# Patient Record
Sex: Female | Born: 1960 | Race: Black or African American | Hispanic: No | Smoking: Former smoker
Health system: Southern US, Community
[De-identification: ages and names within clinical notes are randomized; demographics above are authoritative.]

## PROBLEM LIST (undated history)

## (undated) DIAGNOSIS — E559 Vitamin D deficiency, unspecified: Secondary | ICD-10-CM

## (undated) DIAGNOSIS — E119 Type 2 diabetes mellitus without complications: Secondary | ICD-10-CM

## (undated) DIAGNOSIS — E079 Disorder of thyroid, unspecified: Secondary | ICD-10-CM

## (undated) DIAGNOSIS — N2881 Hypertrophy of kidney: Secondary | ICD-10-CM

## (undated) DIAGNOSIS — E039 Hypothyroidism, unspecified: Secondary | ICD-10-CM

## (undated) DIAGNOSIS — I10 Essential (primary) hypertension: Secondary | ICD-10-CM

## (undated) DIAGNOSIS — R160 Hepatomegaly, not elsewhere classified: Secondary | ICD-10-CM

## (undated) DIAGNOSIS — E785 Hyperlipidemia, unspecified: Secondary | ICD-10-CM

## (undated) DIAGNOSIS — R0683 Snoring: Secondary | ICD-10-CM

## (undated) DIAGNOSIS — M8580 Other specified disorders of bone density and structure, unspecified site: Secondary | ICD-10-CM

## (undated) DIAGNOSIS — G4730 Sleep apnea, unspecified: Secondary | ICD-10-CM

## (undated) DIAGNOSIS — L304 Erythema intertrigo: Secondary | ICD-10-CM

## (undated) DIAGNOSIS — Z5189 Encounter for other specified aftercare: Secondary | ICD-10-CM

## (undated) DIAGNOSIS — E669 Obesity, unspecified: Secondary | ICD-10-CM

## (undated) DIAGNOSIS — L83 Acanthosis nigricans: Secondary | ICD-10-CM

## (undated) DIAGNOSIS — L810 Postinflammatory hyperpigmentation: Secondary | ICD-10-CM

## (undated) HISTORY — DX: Other specified disorders of bone density and structure, unspecified site: M85.80

## (undated) HISTORY — DX: Hepatomegaly, not elsewhere classified: R16.0

## (undated) HISTORY — DX: Type 2 diabetes mellitus without complications: E11.9

## (undated) HISTORY — DX: Encounter for other specified aftercare: Z51.89

## (undated) HISTORY — PX: VESICOVAGINAL FISTULA CLOSURE W/ TAH: SUR271

## (undated) HISTORY — DX: Hyperlipidemia, unspecified: E78.5

## (undated) HISTORY — DX: Hypothyroidism, unspecified: E03.9

## (undated) HISTORY — DX: Vitamin D deficiency, unspecified: E55.9

## (undated) HISTORY — DX: Hypertrophy of kidney: N28.81

## (undated) HISTORY — DX: Erythema intertrigo: L30.4

## (undated) HISTORY — DX: Acanthosis nigricans: L83

## (undated) HISTORY — DX: Sleep apnea, unspecified: G47.30

## (undated) HISTORY — PX: COSMETIC SURGERY: SHX468

## (undated) HISTORY — DX: Snoring: R06.83

## (undated) HISTORY — DX: Postinflammatory hyperpigmentation: L81.0

## (undated) HISTORY — PX: OTHER SURGICAL HISTORY: SHX169

## (undated) HISTORY — DX: Disorder of thyroid, unspecified: E07.9

## (undated) HISTORY — DX: Hemochromatosis, unspecified: E83.119

## (undated) HISTORY — DX: Essential (primary) hypertension: I10

## (undated) HISTORY — DX: Obesity, unspecified: E66.9

## (undated) HISTORY — PX: BREAST REDUCTION SURGERY: SHX8

---

## 1983-01-20 HISTORY — PX: REDUCTION MAMMAPLASTY: SUR839

## 1998-01-19 HISTORY — PX: CHOLECYSTECTOMY: SHX55

## 2003-01-20 HISTORY — PX: ABDOMINAL HYSTERECTOMY: SHX81

## 2005-07-06 ENCOUNTER — Other Ambulatory Visit: Payer: Self-pay

## 2005-07-06 ENCOUNTER — Observation Stay: Payer: Self-pay | Admitting: Obstetrics and Gynecology

## 2005-07-06 IMAGING — CR DG CHEST 2V
1 series · 2 of 2 positions shown · non-contrast
Comparison: none

REASON FOR EXAM: NIDDM/HTN
COMMENTS:

[Series 1: view not recorded · 0.17mm/px · 2 of 2 slices shown]
[im 1/2]
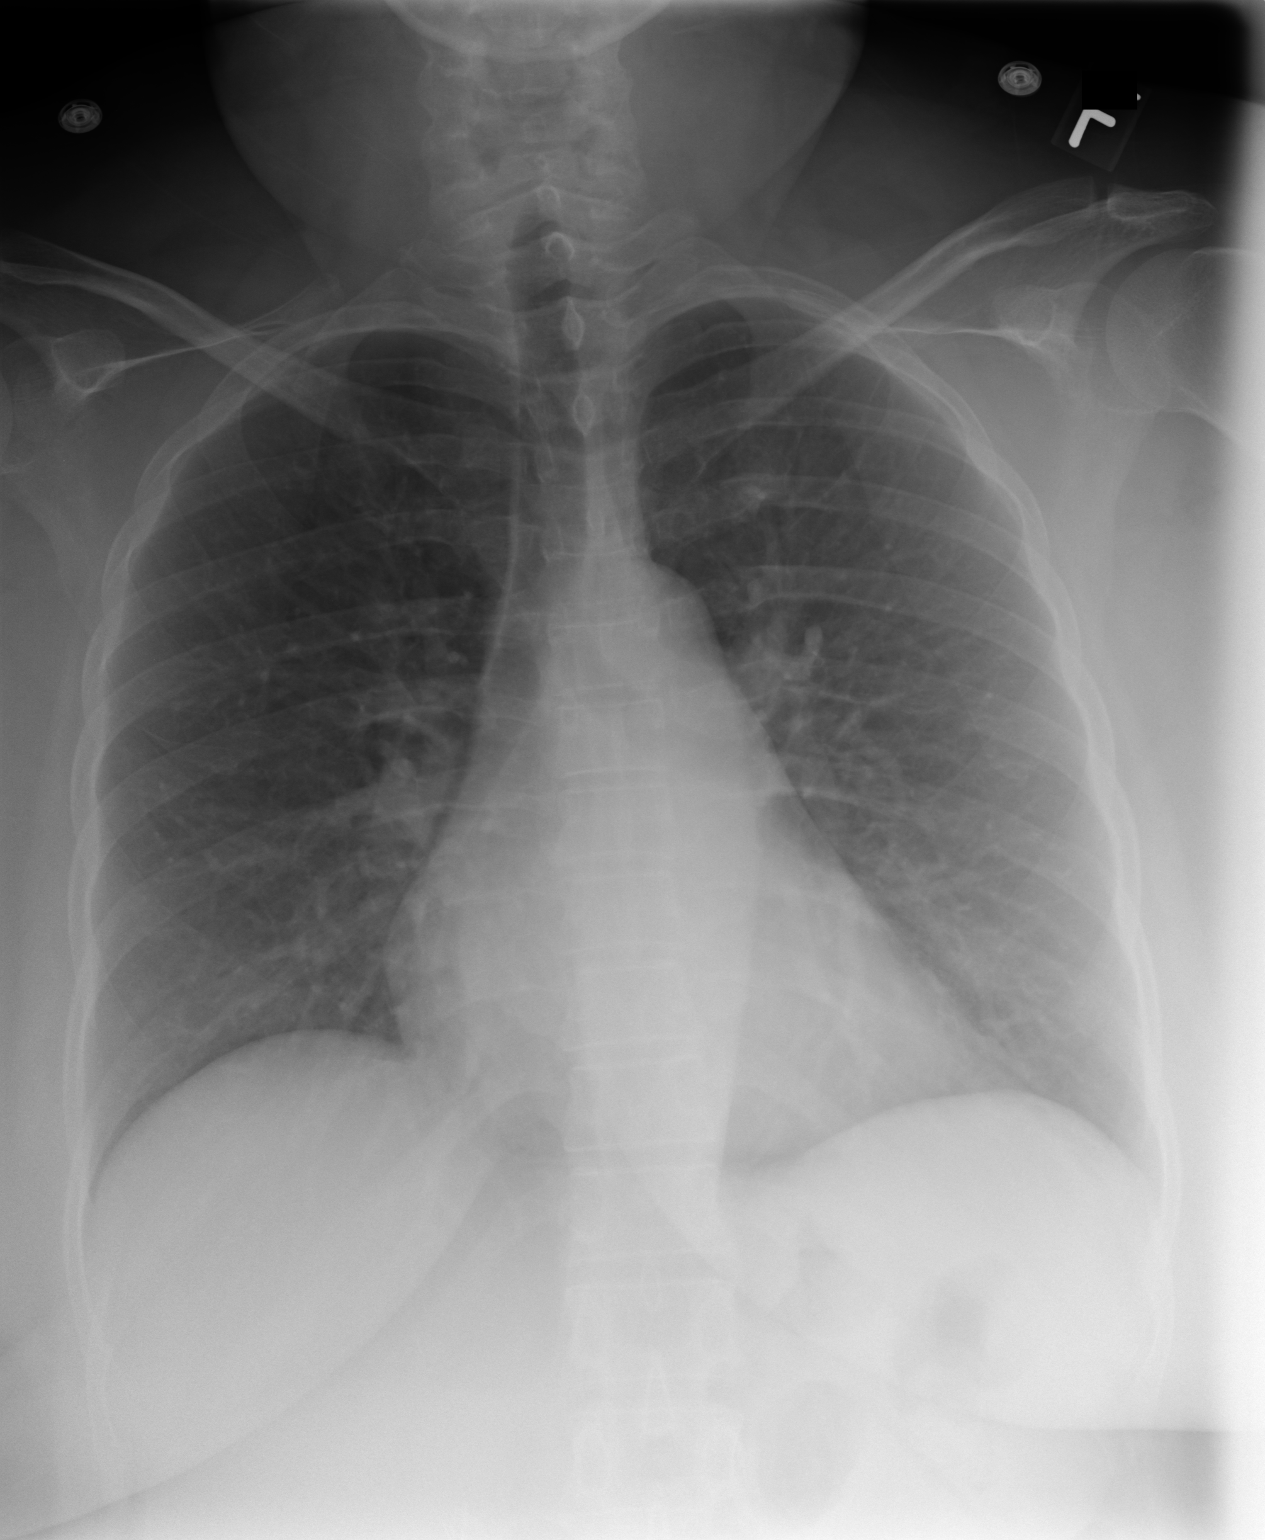
[im 2/2]
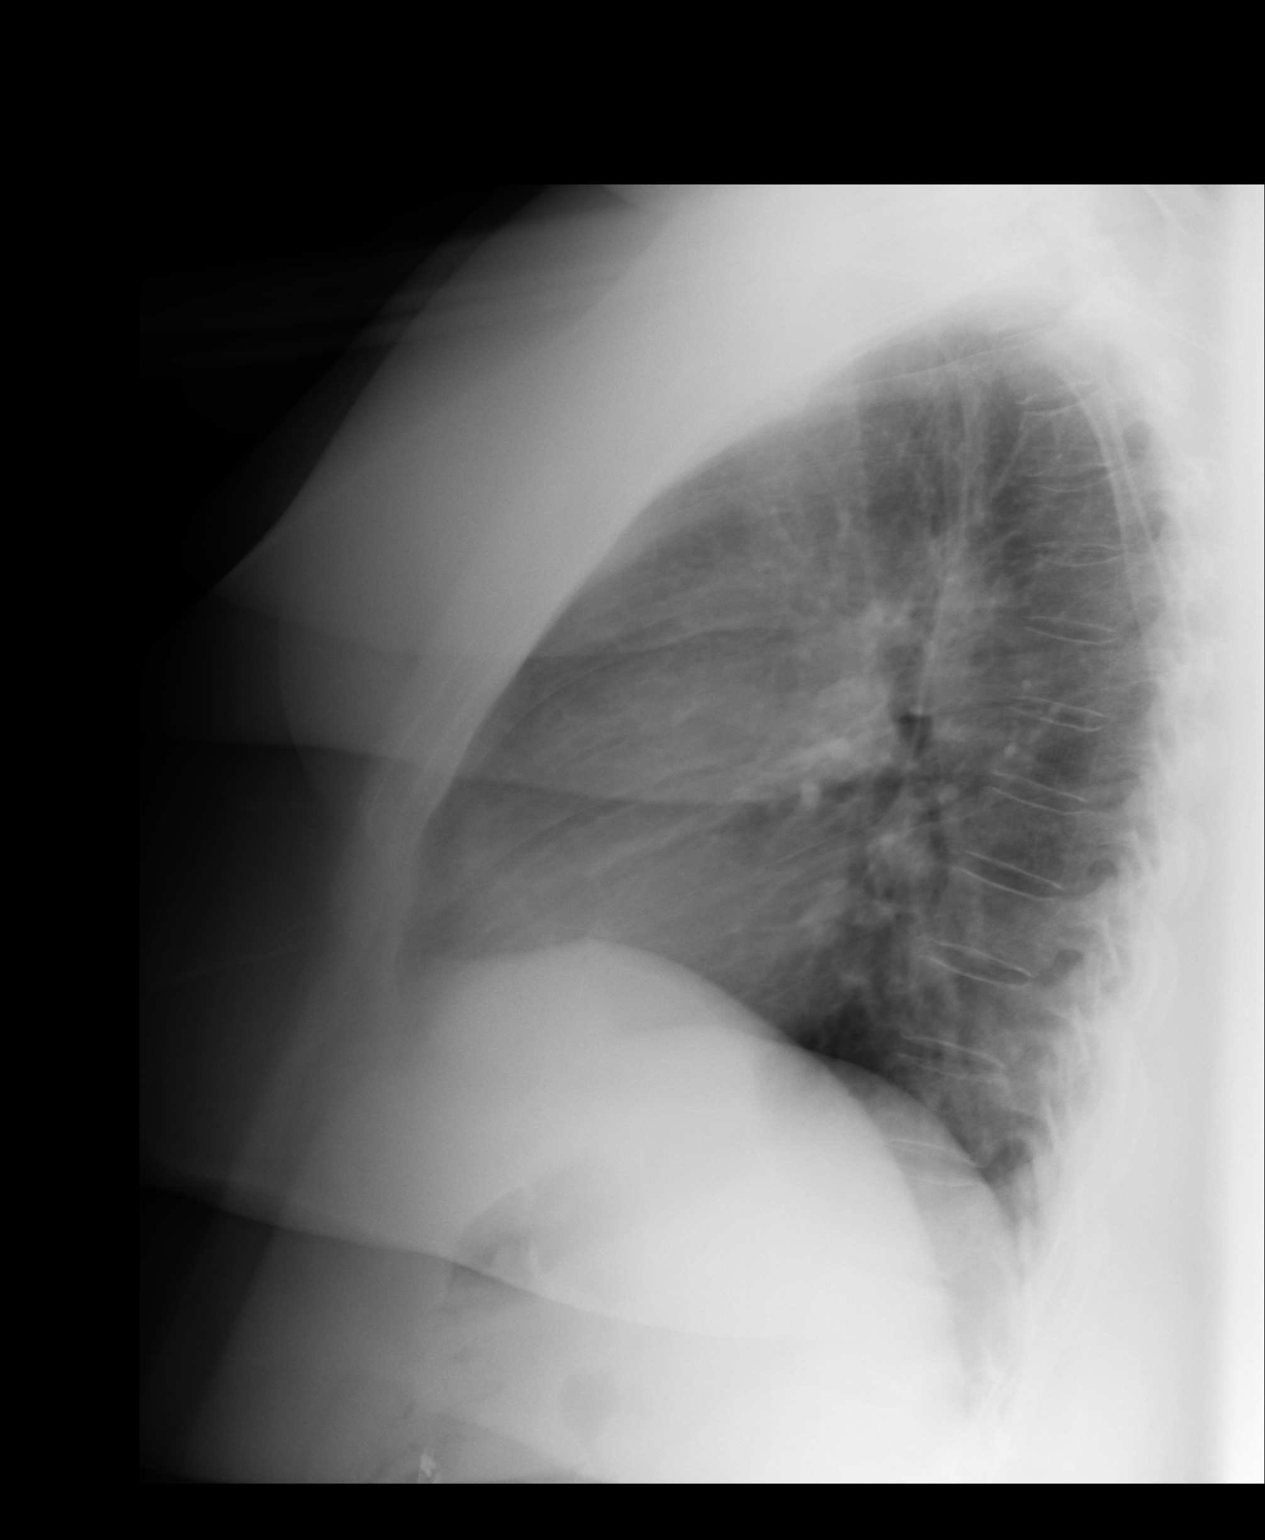

[2 of 2 positions shown; findings below may reference images not displayed]

PROCEDURE:     DXR - DXR CHEST PA (OR AP) AND LATERAL  - [DATE]  [DATE]

RESULT:     The lung fields are clear.  No pneumonia, pneumothorax, or
pleural effusion is seen.  The heart is upper limits for normal in size or
slightly enlarged.  The mediastinal and osseous structures show no
significant abnormalities.
IMPRESSION: No acute changes are identified.

The heart is upper limits for normal in size or slightly enlarged.

## 2005-07-23 ENCOUNTER — Observation Stay: Payer: Self-pay | Admitting: Obstetrics and Gynecology

## 2008-01-16 ENCOUNTER — Emergency Department: Payer: Self-pay | Admitting: Emergency Medicine

## 2008-01-16 IMAGING — CR DG LUMBAR SPINE 2-3V
1 series · 3 of 3 positions shown · non-contrast
Comparison: none

REASON FOR EXAM: lbp
COMMENTS:

[Series 1: view not recorded · 0.17mm/px · 3 of 3 slices shown]
[im 1/3]
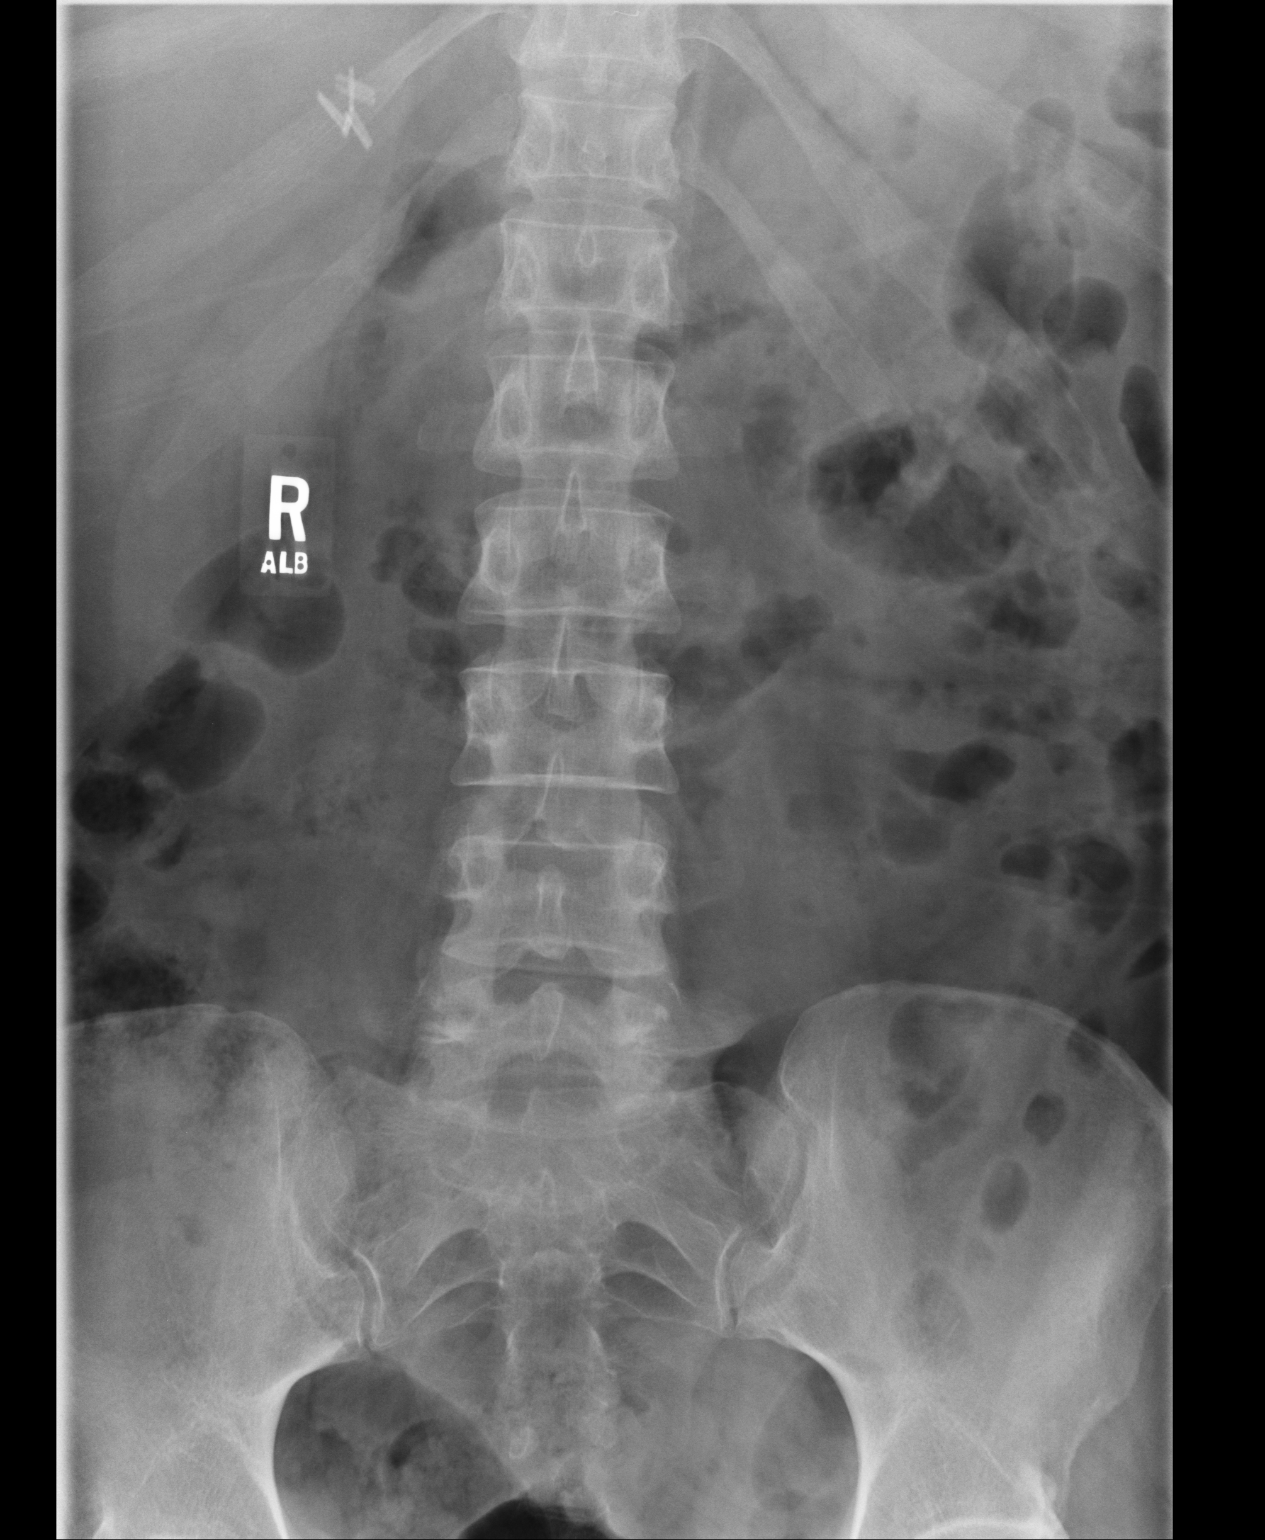
[im 2/3]
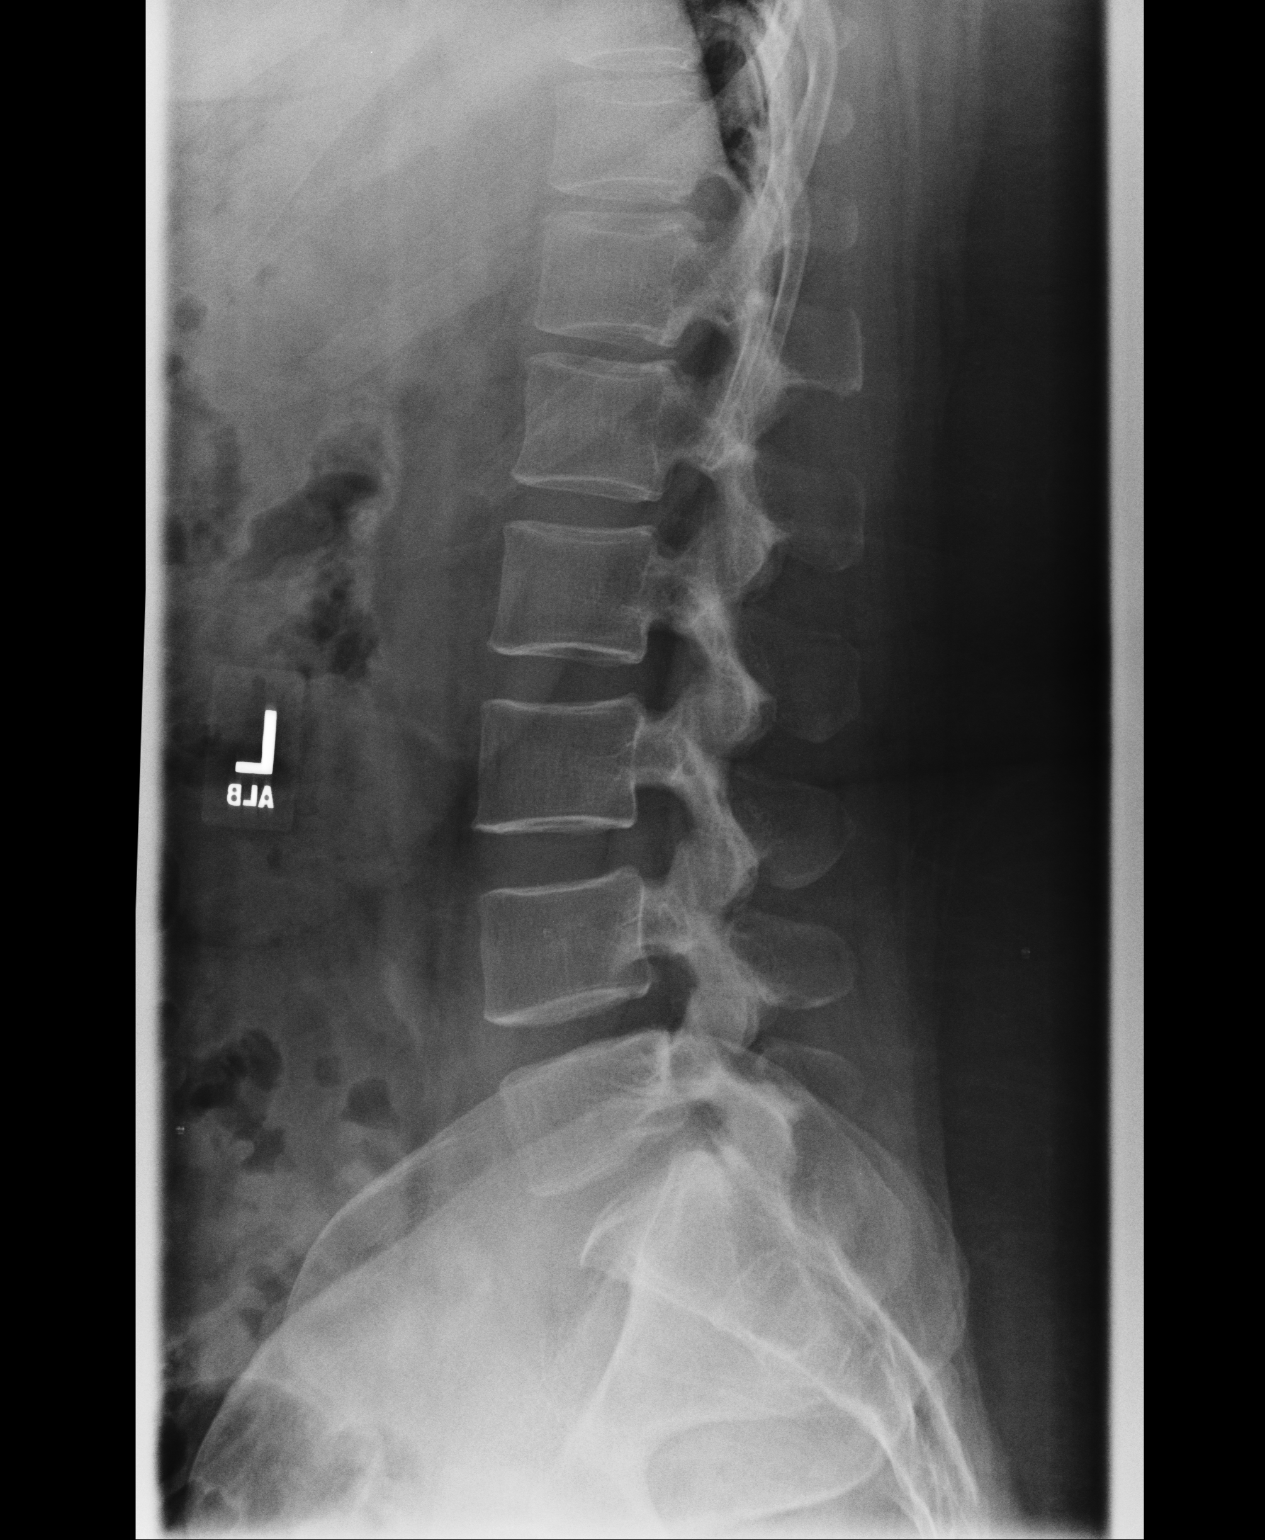
[im 3/3]
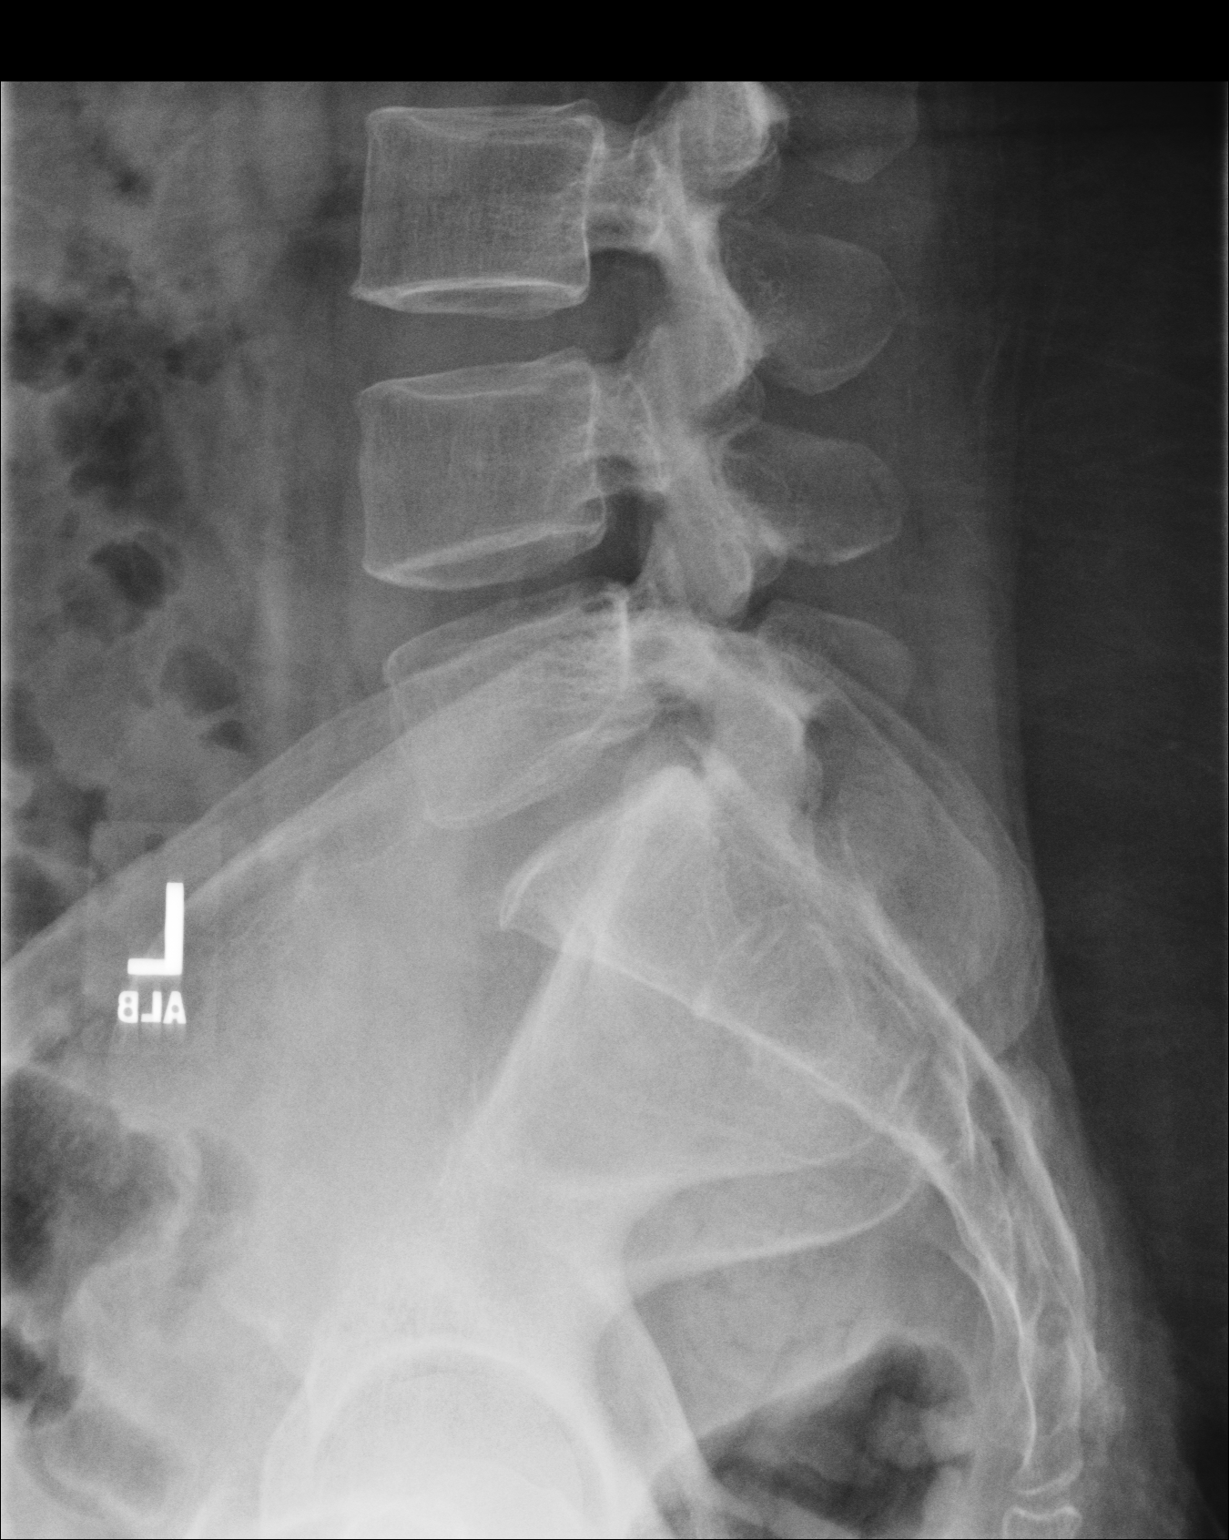

[3 of 3 positions shown; findings below may reference images not displayed]

PROCEDURE:     DXR - DXR LUMBAR SPINE AP AND LATERAL  - [DATE]  [DATE]

RESULT:     The vertebral body heights are well maintained. The vertebral
alignment is normal. There is a slightly narrowed appearance to the L4-L5
intervertebral disc space. The finding is minimal but could represent early
manifestation of disc disease. This could be further evaluated by MR if
clinically indicated. The intervertebral disc spaces otherwise are
maintained. The pedicles are bilaterally intact.
IMPRESSION: 1. No fracture or other acute bony abnormality is seen.
2. There is slight narrowing of the L4-L5 intervertebral disc space which
could represent early manifestation of disc disease. This could be further
evaluated by MR if clinically indicated.

## 2008-01-16 IMAGING — CR PELVIS - 1-2 VIEW
1 series · 2 of 2 positions shown · non-contrast
Comparison: none

REASON FOR EXAM: lbp to buttocks
COMMENTS:

PROCEDURE:     DXR - DXR PELVIS AP ONLY  - [DATE]  [DATE]
RESULT:     An AP view of the bony and pelvis shows no fracture or other
significant osseous abnormality. The hip joint spaces are well maintained.
The sacroiliac joints are normal in appearance bilaterally.

[Series 1: view not recorded · 0.17mm/px · 2 of 2 slices shown]
[im 1/2]
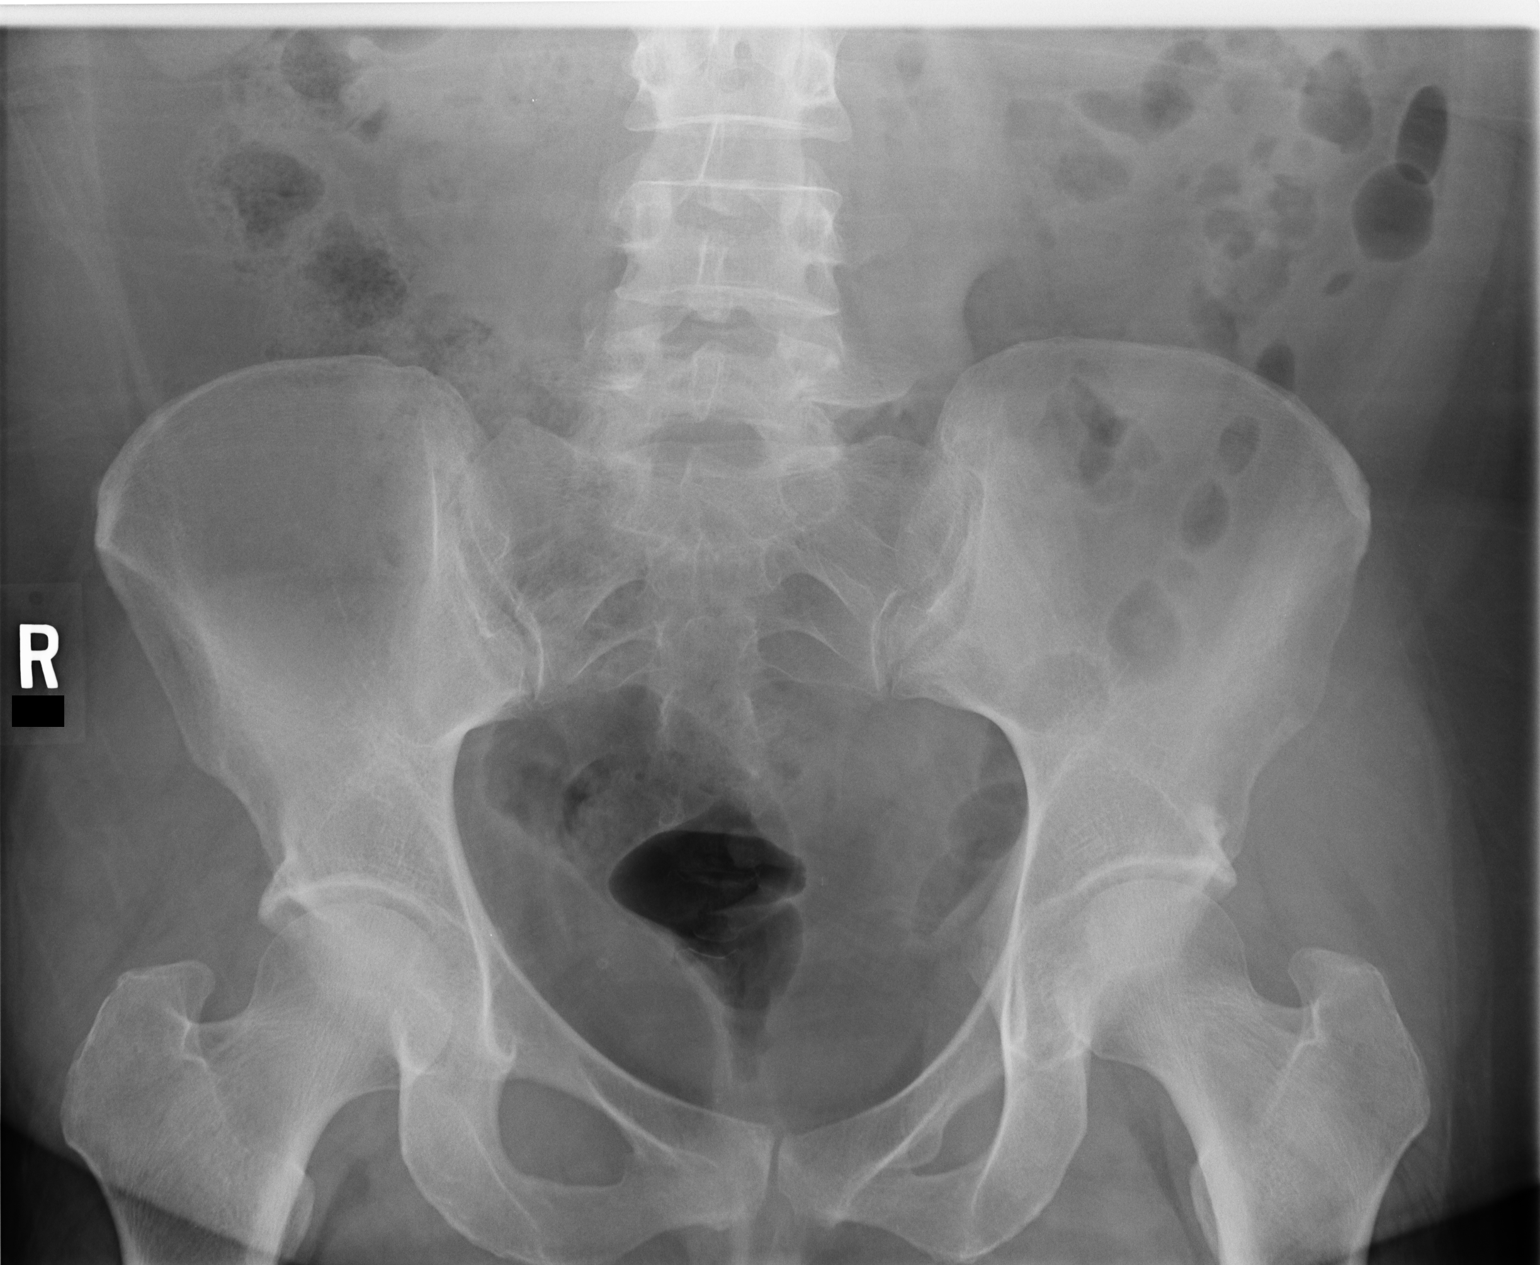
[im 2/2]
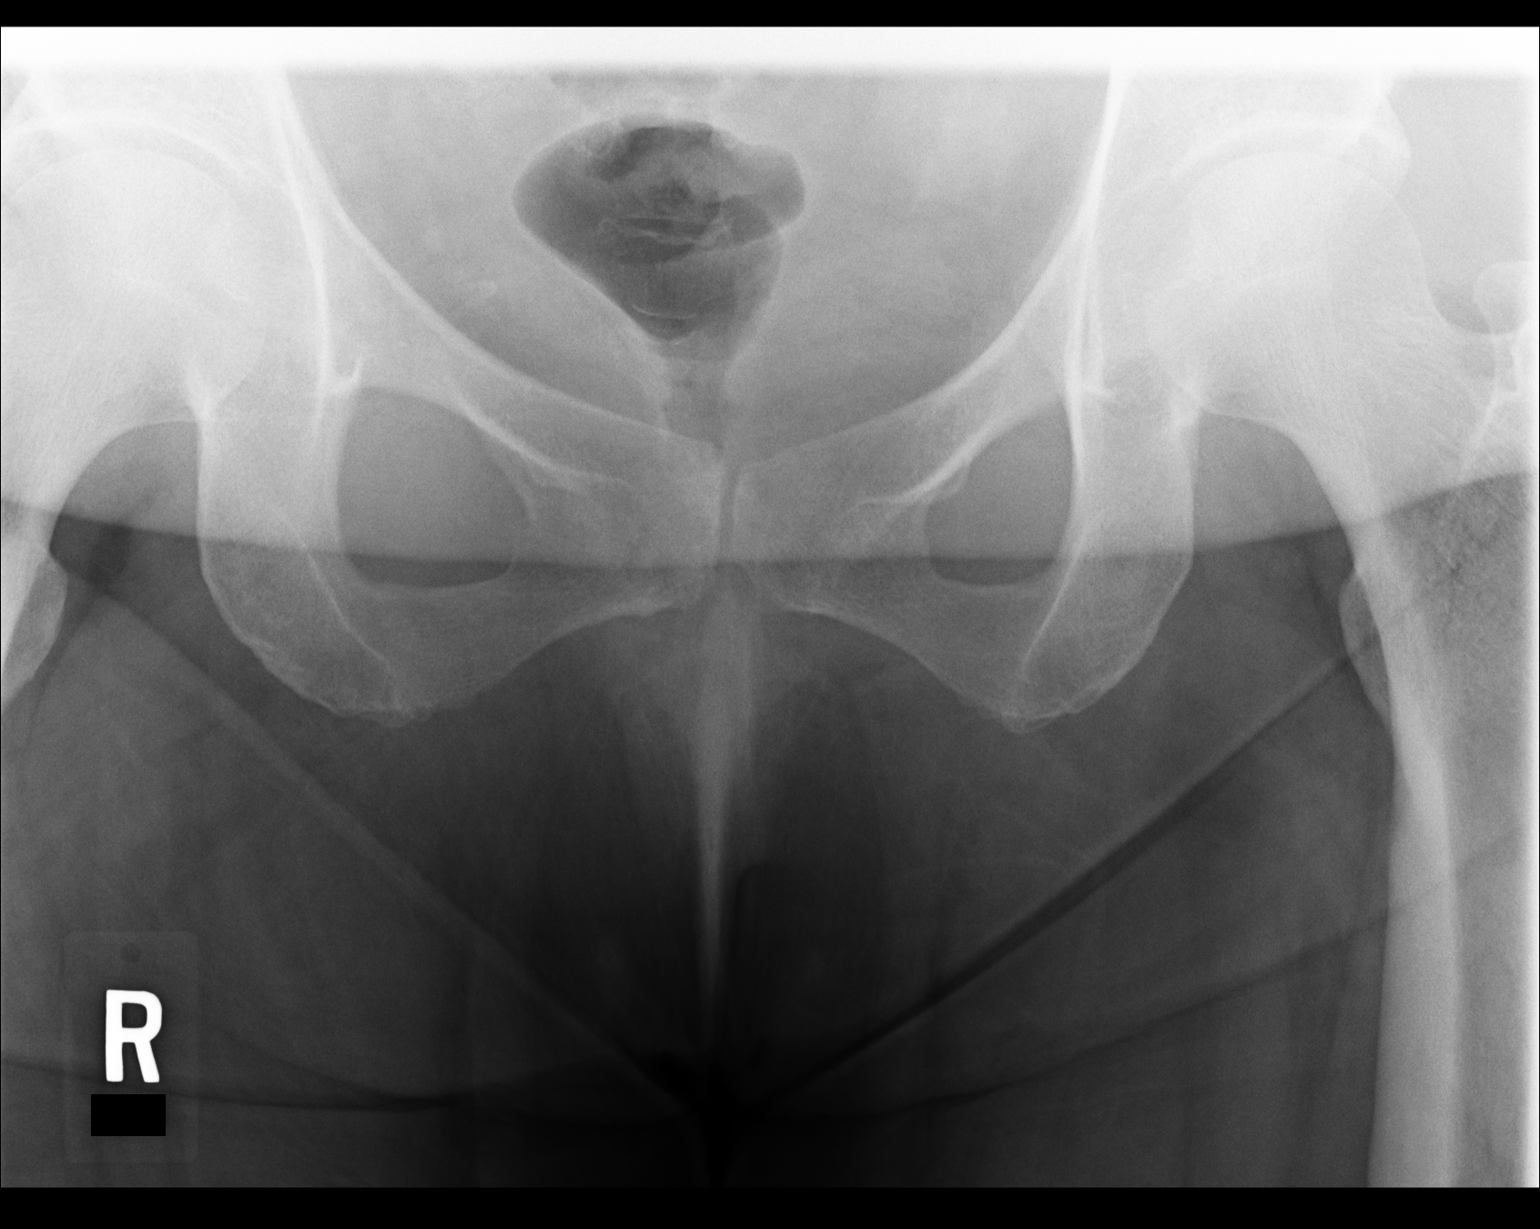

[2 of 2 positions shown; findings below may reference images not displayed]

IMPRESSION: No acute changes are identified.

## 2008-12-27 ENCOUNTER — Encounter: Payer: Self-pay | Admitting: Cardiovascular Disease

## 2008-12-27 LAB — CONVERTED CEMR LAB
Cholesterol: 264 mg/dL
HDL: 42 mg/dL
LDL Cholesterol: 182 mg/dL

## 2009-01-19 ENCOUNTER — Ambulatory Visit: Payer: Self-pay | Admitting: Oncology

## 2009-01-21 ENCOUNTER — Ambulatory Visit: Payer: Self-pay | Admitting: Oncology

## 2009-02-19 ENCOUNTER — Ambulatory Visit: Payer: Self-pay | Admitting: Oncology

## 2009-03-19 ENCOUNTER — Ambulatory Visit: Payer: Self-pay | Admitting: Oncology

## 2009-03-25 ENCOUNTER — Ambulatory Visit: Payer: Self-pay | Admitting: Oncology

## 2009-04-04 ENCOUNTER — Encounter: Payer: Self-pay | Admitting: Cardiovascular Disease

## 2009-04-19 ENCOUNTER — Ambulatory Visit: Payer: Self-pay | Admitting: Oncology

## 2009-05-19 ENCOUNTER — Ambulatory Visit: Payer: Self-pay | Admitting: Oncology

## 2009-05-29 ENCOUNTER — Ambulatory Visit: Payer: Self-pay | Admitting: Cardiovascular Disease

## 2009-06-19 ENCOUNTER — Ambulatory Visit: Payer: Self-pay | Admitting: Oncology

## 2009-07-19 ENCOUNTER — Ambulatory Visit: Payer: Self-pay | Admitting: Oncology

## 2009-08-19 ENCOUNTER — Ambulatory Visit: Payer: Self-pay | Admitting: Oncology

## 2009-09-19 ENCOUNTER — Ambulatory Visit: Payer: Self-pay | Admitting: Oncology

## 2009-11-13 ENCOUNTER — Ambulatory Visit: Payer: Self-pay | Admitting: Oncology

## 2009-11-19 ENCOUNTER — Ambulatory Visit: Payer: Self-pay | Admitting: Oncology

## 2009-12-19 ENCOUNTER — Ambulatory Visit: Payer: Self-pay | Admitting: Oncology

## 2010-01-19 ENCOUNTER — Ambulatory Visit: Payer: Self-pay | Admitting: Oncology

## 2010-01-19 ENCOUNTER — Ambulatory Visit: Payer: Self-pay

## 2010-02-18 NOTE — Letter (Signed)
Summary: PHI  PHI   Imported By: Harlon Flor 06/03/2009 08:00:00  _____________________________________________________________________  External Attachment:    Type:   Image     Comment:   External Document

## 2010-02-18 NOTE — Assessment & Plan Note (Signed)
Summary: NP6/AMD   Visit Type:  NP6 Referring Provider:  Carlynn Purl Primary Provider:  Anthony Sar, MD  CC:   abnormal EKG.  History of Present Illness: [name] is a very pleasant 50 year old woman, patient of Dr. Carlynn Purl, who has a history of type 2 diabetes, obesity, polycythemia ( though the patient states she has hemochromatosis), hyperlipidemia, hypothyroidism who presents for abnormal EKG.  [name] States that she feels well. She denies any significant shortness of breath or chest discomfort or chest burning. She states that other than her above stated medical issues, she has no problems. She is trying to walk and has been exercising 3 times a week at least a mile at a time for the past month with no significant symptoms of chest discomfort or shortness of breath. She is trying to watch her diet in an effort to decrease her weight. She asks about various programs such as injection of hCG and other quick weight loss supplements. She denies any lower extremity edema, lightheadedness. She states that she feels well when she exercises with no profuse sweating or other symptoms.  Preventive Screening-Counseling & Management  Alcohol-Tobacco     Alcohol drinks/day: 0     Smoking Status: quit  Caffeine-Diet-Exercise     Caffeine use/day: 2     Does Patient Exercise: yes  Current Problems (verified): 1)  Hypertension, Benign  (ICD-401.1) 2)  Hypercholesterolemia Iia  (ICD-272.0) 3)  Abnormal Ekg  (ICD-794.31)  Current Medications (verified): 1)  Metformin Hcl 500 Mg Tabs (Metformin Hcl) .Marland Kitchen.. 1 By Mouth Once Daily 2)  Spironolactone 25 Mg Tabs (Spironolactone) .... 2  Tablet By Mouth Daily 3)  Levothyroxine Sodium 75 Mcg Tabs (Levothyroxine Sodium) .Marland Kitchen.. 1 By Mouth Once Daily 4)  Lotrel 5-20 Mg Caps (Amlodipine Besy-Benazepril Hcl) .Marland Kitchen.. 1 By Mouth Once Daily 5)  Crestor 20 Mg Tabs (Rosuvastatin Calcium) .... Take One Tablet By Mouth Daily. 6)  Aspirin 81 Mg Tbec (Aspirin) .... Take  One Tablet By Mouth Daily  Allergies (verified): No Known Drug Allergies  Past History:  Past Surgical History: Last updated: April 17, 2009 breast reduction c-section lap cholecystectomy hysterectomy w/o bso blood transfusion yearly   Family History: Last updated: 04/17/09 Father: deceased 2's complications of liver cirrhosis and pancreatic cancer. Mother: deceased 74: Alzheimer's; HTN: hypothyroidism; CAD; blood disorder Sister: deceased 79's suicide Sister: living ? Brother: living: HTN  Social History: Last updated: 04-17-09 Full Time Married  Tobacco Use - Former.  Alcohol Use - no Regular Exercise - no Drug Use - no  Risk Factors: Alcohol Use: 0 (05/29/2009) Caffeine Use: 2 (05/29/2009) Exercise: yes (05/29/2009)  Risk Factors: Smoking Status: quit (05/29/2009)  Past Medical History: Hyperlipidemia Hypertension Hypothyroidism Diabetes Type 2 Obesity Hemachromatoasis  Social History: Alcohol drinks/day:  0 Caffeine use/day:  2 Does Patient Exercise:  yes  Review of Systems       The patient complains of weight gain.  The patient denies fever, weight loss, vision loss, decreased hearing, hoarseness, chest pain, syncope, dyspnea on exertion, peripheral edema, prolonged cough, abdominal pain, incontinence, muscle weakness, depression, and enlarged lymph nodes.    Vital Signs:  Patient profile:   50 year old female Height:      67 inches Weight:      268.25 pounds BMI:     42.17 Pulse rate:   72 / minute Pulse rhythm:   regular BP sitting:   132 / 90  (left arm) Cuff size:   large  Vitals Entered By: Mercer Pod (May 29, 2009 11:20 AM)  Physical Exam  General:  well-appearing moderately obese woman in no apparent distress, HEENT exam is benign, or frank is clear, neck is supple with no JVP or carotid bruits, heart sounds are regular with S1-S2 and no murmurs appreciated, lungs are clear to auscultation with no wheezes Rales, abdominal  exam is benign apart from mild obesity, no significant lower extremity edema, neurologic exam is grossly nonfocal, skin is warm and dry. Pulses are equal and symmetrical in her upper and lower extremities.    EKG  Procedure date:  05/29/2009  Findings:      normal sinus rhythm with rate of 76 beats per minute, no significant ST or T wave changes.   Previous EKG done in Dr.Sowles office shows normal sinus rhythm with rate of 78 beats per minute with no significant ST or T wave unlikely benign.  Impression & Recommendations:  Problem # 1:  ABNORMAL EKG (ICD-794.31) EKG from March 14 and additional EKG done today are essentially normal. She has no significant symptoms of ischemia. I am excited that she is exercising now 3 times per week for at least a mile at a time and has no symptoms. I suggest that we watch her for now with no stress testing indicated as her EKG is essentially benign.  She does have diabetes, has no significant family history, is not a smoker. She's recently been started on low-dose aspirin. I've asked her to contact me if she has symptoms of chest tightness or worsening shortness of breath that is new and consistent.  Her updated medication list for this problem includes:    Lotrel 5-20 Mg Caps (Amlodipine besy-benazepril hcl) .Marland Kitchen... 1 by mouth once daily    Aspirin 81 Mg Tbec (Aspirin) .Marland Kitchen... Take one tablet by mouth daily  Problem # 2:  HYPERTENSION, BENIGN (ICD-401.1) Blood pressure was well-controlled on her visit today and I made no other medication changes.  Her updated medication list for this problem includes:    Spironolactone 25 Mg Tabs (Spironolactone) .Marland Kitchen... 2  tablet by mouth daily    Lotrel 5-20 Mg Caps (Amlodipine besy-benazepril hcl) .Marland Kitchen... 1 by mouth once daily    Aspirin 81 Mg Tbec (Aspirin) .Marland Kitchen... Take one tablet by mouth daily  Problem # 3:  HYPERCHOLESTEROLEMIA  IIA (ICD-272.0) She is currently on Crestor but no lipid numbers are available. Goal  LDL for a diabetic with no significant family history should be LDL less than 100.  Her updated medication list for this problem includes:    Crestor 20 Mg Tabs (Rosuvastatin calcium) .Marland Kitchen... Take one tablet by mouth daily.  Patient Instructions: 1)  Your physician recommends that you schedule a follow-up appointment as needed.  Call us with any new symptoms.

## 2010-03-05 ENCOUNTER — Ambulatory Visit: Payer: Self-pay | Admitting: Oncology

## 2010-03-20 ENCOUNTER — Ambulatory Visit: Payer: Self-pay | Admitting: Oncology

## 2010-04-20 ENCOUNTER — Ambulatory Visit: Payer: Self-pay | Admitting: Oncology

## 2010-05-20 ENCOUNTER — Ambulatory Visit: Payer: Self-pay | Admitting: Oncology

## 2010-06-20 ENCOUNTER — Ambulatory Visit: Payer: Self-pay | Admitting: Oncology

## 2010-07-24 ENCOUNTER — Ambulatory Visit: Payer: Self-pay | Admitting: Oncology

## 2010-08-06 ENCOUNTER — Encounter: Payer: Self-pay | Admitting: Cardiovascular Disease

## 2010-08-20 ENCOUNTER — Ambulatory Visit: Payer: Self-pay | Admitting: Oncology

## 2010-09-20 ENCOUNTER — Ambulatory Visit: Payer: Self-pay | Admitting: Oncology

## 2010-10-23 ENCOUNTER — Ambulatory Visit: Payer: Self-pay | Admitting: Oncology

## 2010-11-20 ENCOUNTER — Ambulatory Visit: Payer: Self-pay | Admitting: Oncology

## 2010-12-20 ENCOUNTER — Ambulatory Visit: Payer: Self-pay | Admitting: Oncology

## 2011-04-24 ENCOUNTER — Ambulatory Visit: Payer: Self-pay | Admitting: Oncology

## 2011-05-20 ENCOUNTER — Ambulatory Visit: Payer: Self-pay | Admitting: Oncology

## 2011-07-24 ENCOUNTER — Ambulatory Visit: Payer: Self-pay | Admitting: Oncology

## 2011-08-20 ENCOUNTER — Ambulatory Visit: Payer: Self-pay | Admitting: Oncology

## 2011-11-20 ENCOUNTER — Ambulatory Visit: Payer: Self-pay | Admitting: Oncology

## 2011-12-20 ENCOUNTER — Ambulatory Visit: Payer: Self-pay | Admitting: Oncology

## 2012-01-20 ENCOUNTER — Ambulatory Visit: Payer: Self-pay | Admitting: Oncology

## 2012-02-20 ENCOUNTER — Ambulatory Visit: Payer: Self-pay | Admitting: Oncology

## 2012-03-08 LAB — FERRITIN: Ferritin (ARMC): 137 ng/mL (ref 8–388)

## 2012-03-08 LAB — CBC CANCER CENTER
Basophil #: 0.1 x10 3/mm (ref 0.0–0.1)
Basophil %: 0.9 %
Eosinophil %: 1.3 %
HCT: 45.5 % (ref 35.0–47.0)
HGB: 16.1 g/dL — ABNORMAL HIGH (ref 12.0–16.0)
Lymphocyte #: 3.8 x10 3/mm — ABNORMAL HIGH (ref 1.0–3.6)
Lymphocyte %: 42.9 %
Monocyte #: 0.4 x10 3/mm (ref 0.2–0.9)
Monocyte %: 4.7 %
Neutrophil #: 4.4 x10 3/mm (ref 1.4–6.5)
Neutrophil %: 50.2 %
Platelet: 215 x10 3/mm (ref 150–440)
RBC: 4.47 10*6/uL (ref 3.80–5.20)
RDW: 12 % (ref 11.5–14.5)
WBC: 8.8 x10 3/mm (ref 3.6–11.0)

## 2012-03-08 LAB — IRON AND TIBC: Iron Saturation: 39 %

## 2012-03-19 ENCOUNTER — Ambulatory Visit: Payer: Self-pay | Admitting: Oncology

## 2012-05-12 ENCOUNTER — Ambulatory Visit: Payer: Self-pay | Admitting: Oncology

## 2012-05-19 ENCOUNTER — Ambulatory Visit: Payer: Self-pay | Admitting: Oncology

## 2012-06-19 ENCOUNTER — Ambulatory Visit: Payer: Self-pay | Admitting: Oncology

## 2012-07-19 ENCOUNTER — Ambulatory Visit: Payer: Self-pay | Admitting: Oncology

## 2012-11-07 ENCOUNTER — Ambulatory Visit: Payer: Self-pay | Admitting: Oncology

## 2012-11-19 ENCOUNTER — Ambulatory Visit: Payer: Self-pay | Admitting: Oncology

## 2013-02-07 ENCOUNTER — Ambulatory Visit: Payer: Self-pay | Admitting: Oncology

## 2013-02-19 ENCOUNTER — Ambulatory Visit: Payer: Self-pay | Admitting: Oncology

## 2013-05-05 ENCOUNTER — Ambulatory Visit: Payer: Self-pay | Admitting: Oncology

## 2013-05-19 ENCOUNTER — Ambulatory Visit: Payer: Self-pay | Admitting: Oncology

## 2013-09-01 LAB — LIPID PANEL
CHOLESTEROL: 146 mg/dL (ref 0–200)
HDL: 39 mg/dL (ref 35–70)
LDL Cholesterol: 82 mg/dL
Triglycerides: 129 mg/dL (ref 40–160)

## 2013-09-04 ENCOUNTER — Ambulatory Visit: Payer: Self-pay | Admitting: Oncology

## 2013-09-07 ENCOUNTER — Encounter: Payer: Self-pay | Admitting: *Deleted

## 2013-09-19 ENCOUNTER — Ambulatory Visit: Payer: Self-pay | Admitting: Oncology

## 2013-09-27 ENCOUNTER — Other Ambulatory Visit: Payer: Self-pay | Admitting: General Surgery

## 2013-09-27 ENCOUNTER — Ambulatory Visit (INDEPENDENT_AMBULATORY_CARE_PROVIDER_SITE_OTHER): Payer: 59 | Admitting: General Surgery

## 2013-09-27 ENCOUNTER — Other Ambulatory Visit: Payer: Self-pay

## 2013-09-27 ENCOUNTER — Encounter: Payer: Self-pay | Admitting: General Surgery

## 2013-09-27 VITALS — BP 120/74 | HR 74 | Resp 14 | Ht 67.0 in | Wt 264.0 lb

## 2013-09-27 DIAGNOSIS — Z1211 Encounter for screening for malignant neoplasm of colon: Secondary | ICD-10-CM

## 2013-09-27 MED ORDER — POLYETHYLENE GLYCOL 3350 17 GM/SCOOP PO POWD: 1.0000 | Freq: Once | ORAL | Status: DC

## 2013-09-27 NOTE — Progress Notes (Signed)
Patient is scheduled for a colonoscopy at [facility] on 10/31/13. She is aware to pre register with the hospital at least 2 days prior. She will only take her amlodipine at 6 am the morning of the procedure. Miralax prescription has been sent into her pharmacy. Patient is aware of date and instructions.

## 2013-09-27 NOTE — Progress Notes (Addendum)
Patient ID: Tabitha Shaw, female   DOB: November 08, 1960, 53 y.o.   MRN: [id number]  Chief Complaint  Patient presents with  . Other    evaluation for screening colonoscopy    HPI Tabitha Shaw is a 53 y.o. female who presents for an evaluation of a screening colonoscopy. The patient states she had a colonoscopy back in the 80's due to some rectal bleeding.The patient is unsure of who did the colonoscopy.  Both her mother and brother have had colon polyps in the past that were benign. She denies any GI problems at this time.   HPI  Past Medical History  Diagnosis Date  . Hemochromatosis   . Thyroid disease   . Hyperlipidemia   . Hypertension     Past Surgical History  Procedure Laterality Date  . Cesarean section  1992  . Cholecystectomy  2000  . Abdominal hysterectomy  2005    Family History  Problem Relation Age of Onset  . Colon polyps Mother   . Colon polyps Brother     Social History History  Substance Use Topics  . Smoking status: Never Smoker   . Smokeless tobacco: Not on file  . Alcohol Use: Yes    No Known Allergies  Current Outpatient Prescriptions  Medication Sig Dispense Refill  . amLODipine-benazepril (LOTREL) 5-40 MG per capsule Take 1 capsule by mouth daily.      Marland Kitchen aspirin 81 MG tablet Take 81 mg by mouth daily.      Marland Kitchen levothyroxine (SYNTHROID, LEVOTHROID) 75 MCG tablet Take 1 tablet by mouth daily.      Marland Kitchen spironolactone (ALDACTONE) 25 MG tablet Take 1 tablet by mouth daily.       No current facility-administered medications for this visit.    Review of Systems Review of Systems  Constitutional: Negative.   Respiratory: Negative.   Cardiovascular: Negative.   Gastrointestinal: Negative.     Blood pressure 120/74, pulse 74, resp. rate 14, height 5\' 7"  (1.702 m), weight 264 lb (119.75 kg).  Physical Exam Physical Exam  Constitutional: She is oriented to person, place, and time. She appears well-developed and well-nourished.   Neck: Neck supple. No thyromegaly present.  Cardiovascular: Normal rate, regular rhythm and normal heart sounds.   No murmur heard. Pulmonary/Chest: Effort normal and breath sounds normal.  Lymphadenopathy:    She has no cervical adenopathy.  Neurological: She is alert and oriented to person, place, and time.  Skin: Skin is warm and dry.    Data Reviewed Comprehensive metabolic panel showed a blood sugar of 159 and a hemoglobin A1c at the upper limit of normal.   September 01, 2013: WBC 9.0, hemoglobin 14.9, platelets 247. Total iron 136, iron saturation 56%, ferritin 135.  Assessment    Family history of colon polyps.  Candidate for screening colonoscopy.    Plan    Colonoscopy procedure was reviewed. Risks associated with the procedure including those of bleeding and perforation were discussed.  The patient works second shift and will likely benefit from a split prep, completing one half on return home late evening the night prior to the procedure, the second half to be completed at 6 AM the following morning with the procedure scheduled for early afternoon.    PCP and Ref. MD: Tabitha Shaw 09/27/2013, 11:08 AM

## 2013-09-27 NOTE — Patient Instructions (Signed)
Patient to be scheduled for screening colonoscopy. The patient is aware to call back for any questions or concerns.  Colonoscopy A colonoscopy is an exam to look at the entire large intestine (colon). This exam can help find problems such as tumors, polyps, inflammation, and areas of bleeding. The exam takes about 1 hour.  LET [facility] CARE PROVIDER KNOW ABOUT:   Any allergies you have.  All medicines you are taking, including vitamins, herbs, eye drops, creams, and over-the-counter medicines.  Previous problems you or members of your family have had with the use of anesthetics.  Any blood disorders you have.  Previous surgeries you have had.  Medical conditions you have. RISKS AND COMPLICATIONS  Generally, this is a safe procedure. However, as with any procedure, complications can occur. Possible complications include:  Bleeding.  Tearing or rupture of the colon wall.  Reaction to medicines given during the exam.  Infection (rare). BEFORE THE PROCEDURE   Ask your health care provider about changing or stopping your regular medicines.  You may be prescribed an oral bowel prep. This involves drinking a large amount of medicated liquid, starting the day before your procedure. The liquid will cause you to have multiple loose stools until your stool is almost clear or light green. This cleans out your colon in preparation for the procedure.  Do not eat or drink anything else once you have started the bowel prep, unless your health care provider tells you it is safe to do so.  Arrange for someone to drive you home after the procedure. PROCEDURE   You will be given medicine to help you relax (sedative).  You will lie on your side with your knees bent.  A long, flexible tube with a light and camera on the end (colonoscope) will be inserted through the rectum and into the colon. The camera sends video back to a computer screen as it moves through the colon. The colonoscope also  releases carbon dioxide gas to inflate the colon. This helps your health care provider see the area better.  During the exam, your health care provider may take a small tissue sample (biopsy) to be examined under a microscope if any abnormalities are found.  The exam is finished when the entire colon has been viewed. AFTER THE PROCEDURE   Do not drive for 24 hours after the exam.  You may have a small amount of blood in your stool.  You may pass moderate amounts of gas and have mild abdominal cramping or bloating. This is caused by the gas used to inflate your colon during the exam.  Ask when your test results will be ready and how you will get your results. Make sure you get your test results. Document Released: 01/03/2000 Document Revised: 10/26/2012 Document Reviewed: 09/12/2012 [facility] Patient Information 2015 [place], [place]. This information is not intended to replace advice given to you by your health care provider. Make sure you discuss any questions you have with your health care provider.

## 2013-09-28 ENCOUNTER — Encounter: Payer: Self-pay | Admitting: Cardiovascular Disease

## 2013-10-25 ENCOUNTER — Telehealth: Payer: Self-pay | Admitting: *Deleted

## 2013-10-25 NOTE — Telephone Encounter (Signed)
Message left for patient to call the office.  We need to confirm no medication changes since last office visit.  Patient presently scheduled for a colonoscopy on 10-31-13 at [facility]. We need to verify she has pre-registered and has Miralax prescription as well.

## 2013-10-26 ENCOUNTER — Telehealth: Payer: Self-pay | Admitting: *Deleted

## 2013-10-26 NOTE — Telephone Encounter (Signed)
Patient notified that colonoscopy which is scheduled for 10-31-13 at [facility] will be done in the morning that day instead of the afternoon due to a schedule change.   This patient was encouraged to go ahead and pre-register since she has not done so already. Patient reports no change in medications since last office visit.  She was instructed to call the office should she have further questions.

## 2013-10-26 NOTE — Telephone Encounter (Signed)
Patient called back to say that she would like to reschedule colonoscopy from 10-31-13 to 11-14-13 at [facility] due to work schedule.   Endoscopy has been notified of date change.

## 2013-11-14 ENCOUNTER — Ambulatory Visit: Payer: Self-pay | Admitting: General Surgery

## 2013-11-14 DIAGNOSIS — D126 Benign neoplasm of colon, unspecified: Secondary | ICD-10-CM

## 2013-11-16 ENCOUNTER — Encounter: Payer: Self-pay | Admitting: General Surgery

## 2013-11-16 LAB — HM COLONOSCOPY

## 2013-11-20 ENCOUNTER — Telehealth: Payer: Self-pay | Admitting: General Surgery

## 2013-11-20 ENCOUNTER — Encounter: Payer: Self-pay | Admitting: General Surgery

## 2013-11-20 ENCOUNTER — Encounter: Payer: Self-pay | Admitting: Cardiovascular Disease

## 2013-11-20 NOTE — Telephone Encounter (Signed)
1 of 2 polyps was a tubular adenoma w/o atypia. She will be encouraged to have a follow up exam in five years.

## 2013-12-05 ENCOUNTER — Ambulatory Visit: Payer: Self-pay | Admitting: Oncology

## 2013-12-19 ENCOUNTER — Ambulatory Visit: Payer: Self-pay | Admitting: Oncology

## 2014-03-07 ENCOUNTER — Ambulatory Visit: Payer: Self-pay | Admitting: Oncology

## 2014-03-20 ENCOUNTER — Ambulatory Visit
Admit: 2014-03-20 | Disposition: A | Discharge status: Home / Self Care | Payer: Self-pay | Attending: Oncology | Admitting: Oncology

## 2014-03-30 LAB — HM PAP SMEAR: HM Pap smear: NORMAL

## 2014-04-11 ENCOUNTER — Ambulatory Visit: Payer: Self-pay | Admitting: Family Medicine

## 2014-04-11 LAB — HM MAMMOGRAPHY: HM MAMMO: NORMAL

## 2014-04-11 IMAGING — MG MM DIGITAL SCREENING BILAT W/ CAD
8 of 9 series · 8 of 9 positions shown · non-contrast
Comparison: Previous exam(s).

ACR Breast Density Category a: The breast tissue is almost entirely
fatty.

CLINICAL DATA: Screening.

EXAM:
DIGITAL SCREENING BILATERAL MAMMOGRAM WITH CAD

[R CC (1 of 2)]
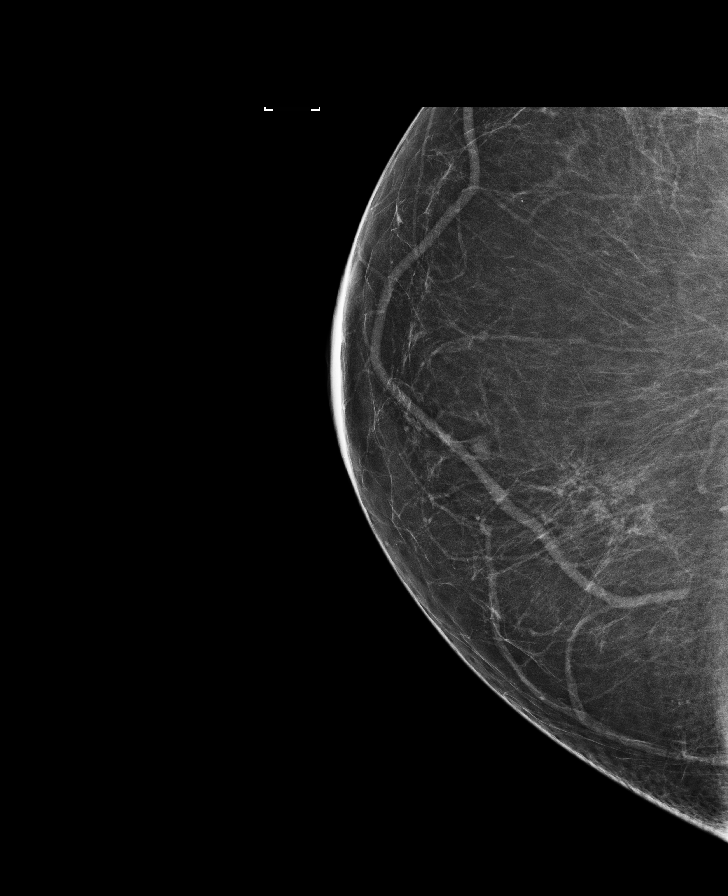

[R MLO]
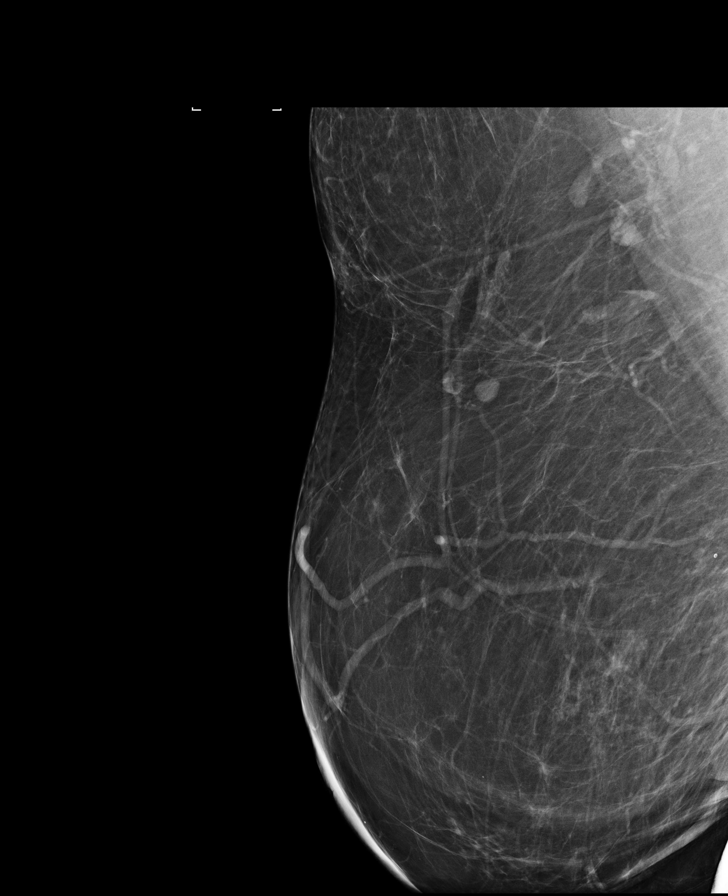

[L MLO (1 of 3)]
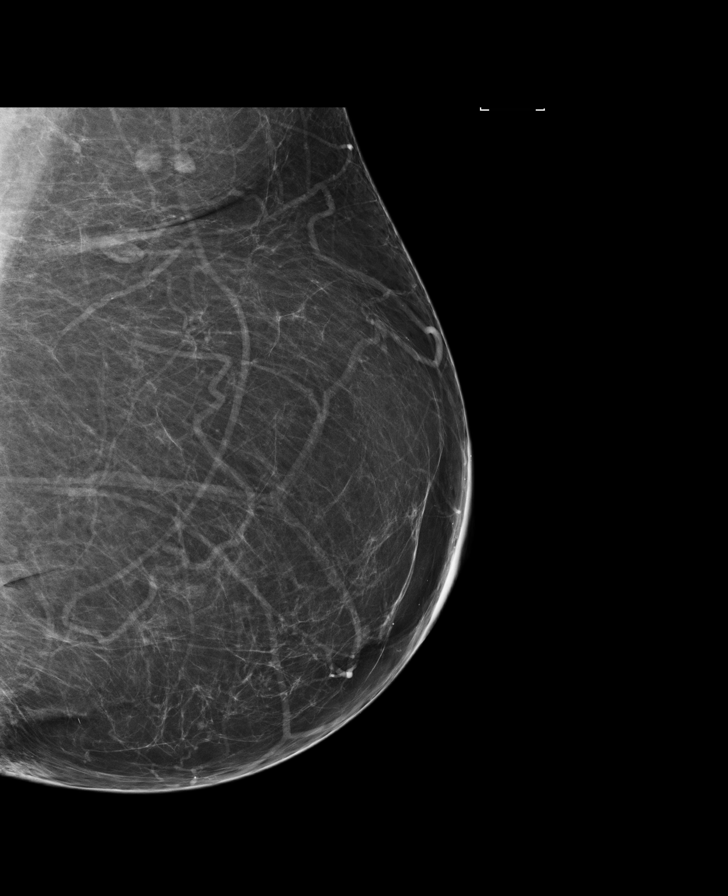

[L CC (1 of 2)]
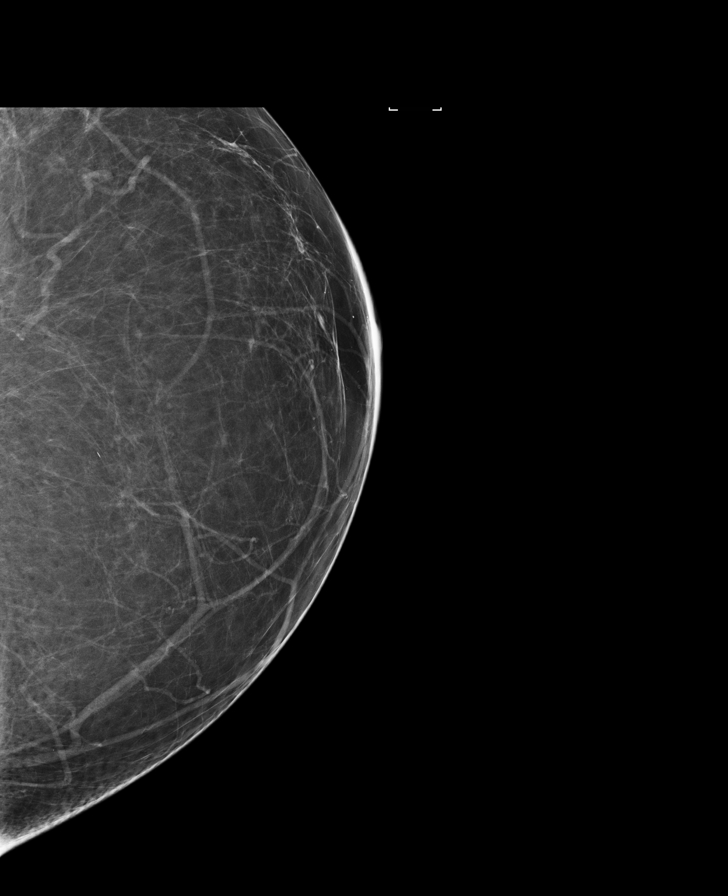

[L MLO (2 of 3)]
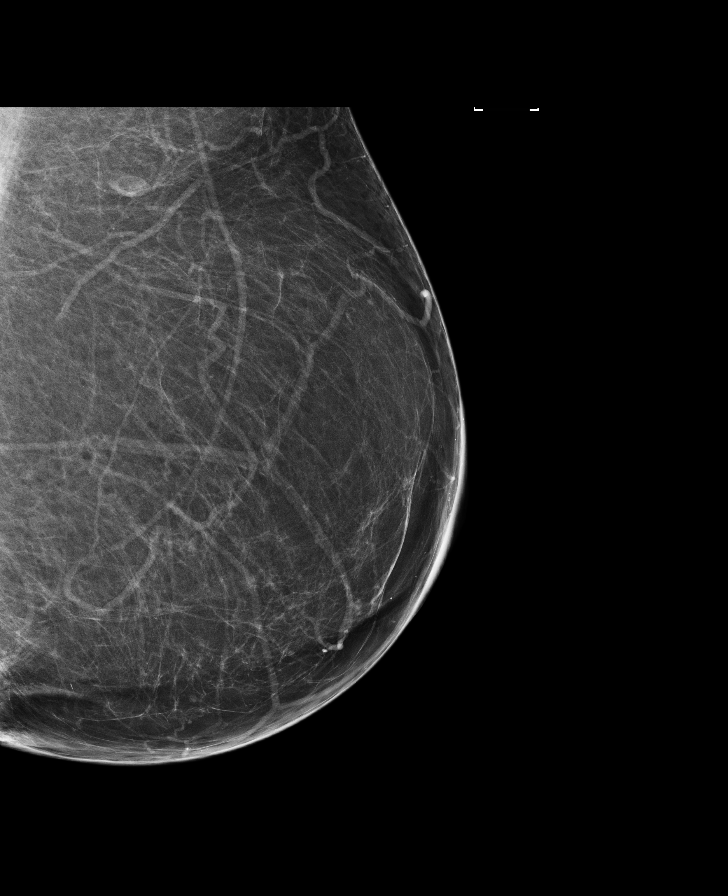

[L CC (2 of 2)]
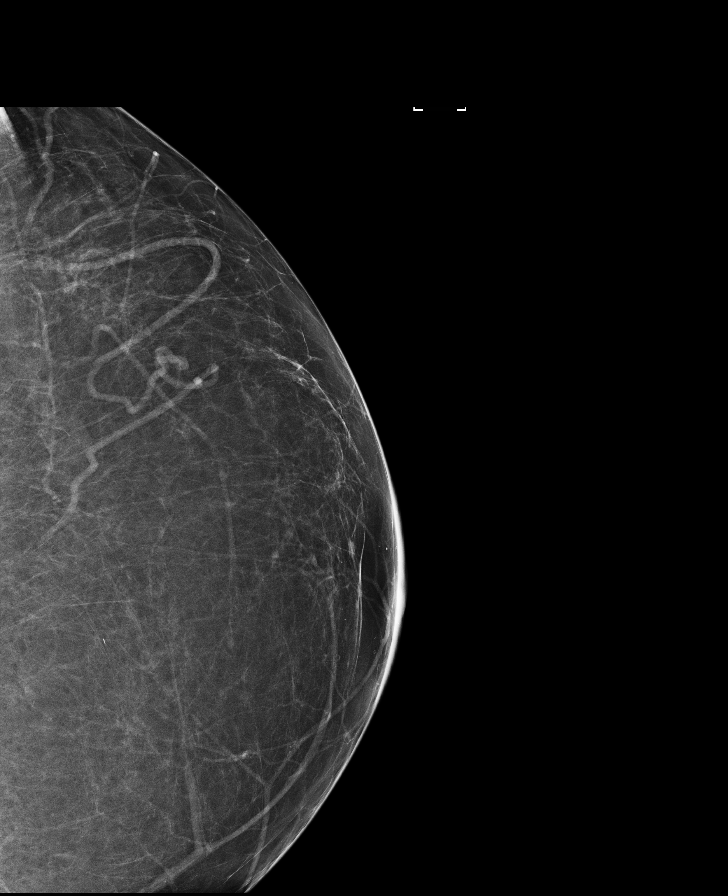

[L MLO (3 of 3)]
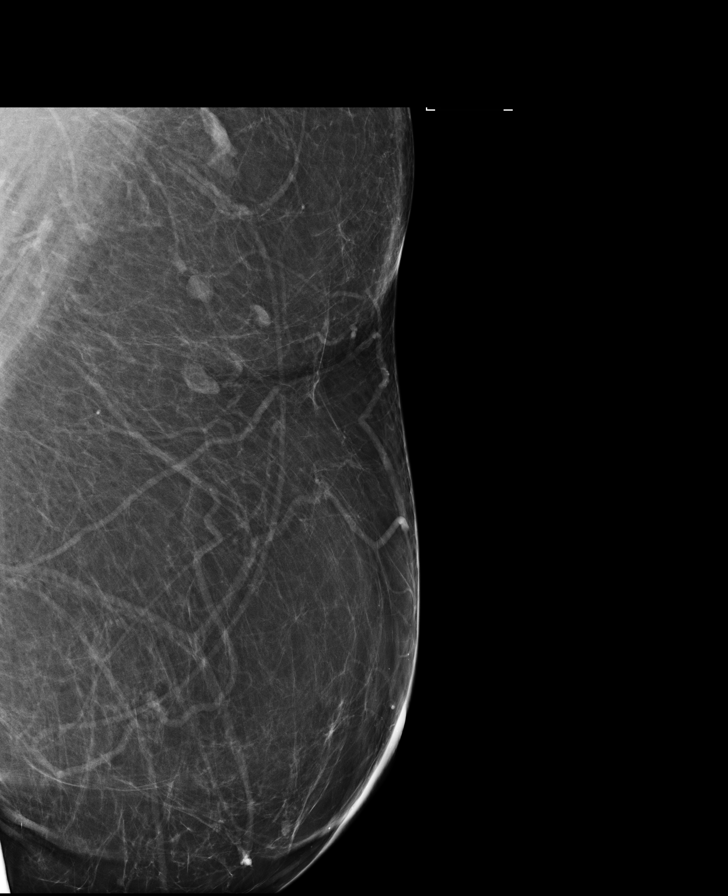

[R CC (2 of 2)]
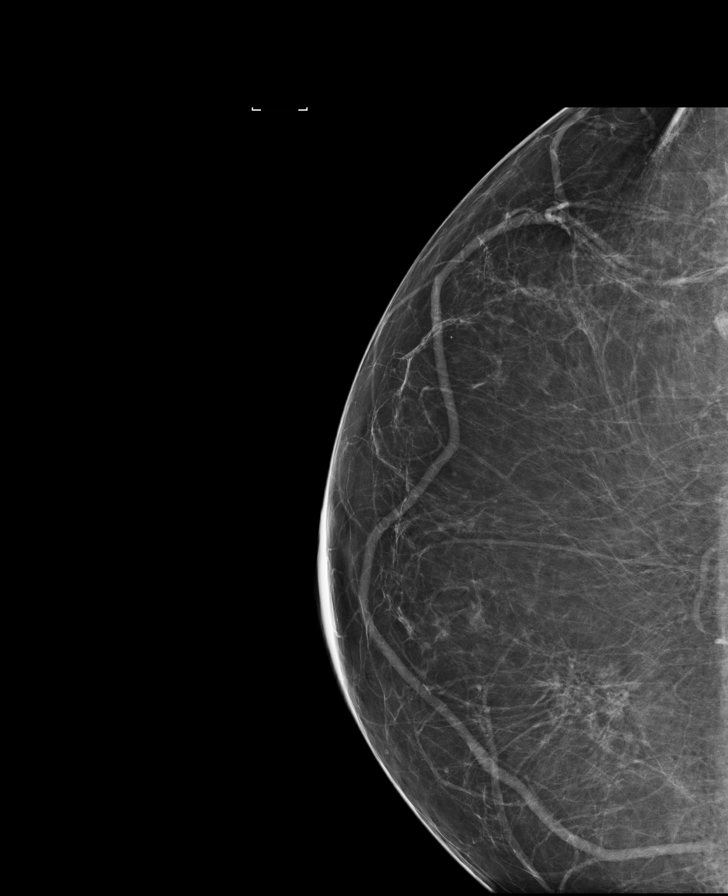

[8 of 9 positions shown; findings below may reference images not displayed]

FINDINGS: There are no findings suspicious for malignancy. Images were
processed with CAD.
IMPRESSION: No mammographic evidence of malignancy. A result letter of this
screening mammogram will be mailed directly to the patient.

RECOMMENDATION:
Screening mammogram in one year. (Code:[0V])

BI-RADS CATEGORY  1: Negative.

## 2014-04-17 ENCOUNTER — Ambulatory Visit: Payer: Self-pay | Admitting: Family Medicine

## 2014-04-17 LAB — HM DEXA SCAN

## 2014-05-14 LAB — SURGICAL PATHOLOGY

## 2014-06-01 ENCOUNTER — Encounter: Payer: Self-pay | Admitting: Oncology

## 2014-06-03 ENCOUNTER — Other Ambulatory Visit: Payer: Self-pay | Admitting: Oncology

## 2014-06-05 ENCOUNTER — Inpatient Hospital Stay: Payer: 59

## 2014-06-05 ENCOUNTER — Encounter (INDEPENDENT_AMBULATORY_CARE_PROVIDER_SITE_OTHER): Payer: Self-pay

## 2014-06-05 ENCOUNTER — Inpatient Hospital Stay: Payer: 59 | Attending: Oncology | Admitting: Oncology

## 2014-06-05 DIAGNOSIS — Z79899 Other long term (current) drug therapy: Secondary | ICD-10-CM | POA: Diagnosis not present

## 2014-06-05 DIAGNOSIS — E119 Type 2 diabetes mellitus without complications: Secondary | ICD-10-CM | POA: Diagnosis not present

## 2014-06-05 DIAGNOSIS — I10 Essential (primary) hypertension: Secondary | ICD-10-CM

## 2014-06-05 DIAGNOSIS — E669 Obesity, unspecified: Secondary | ICD-10-CM

## 2014-06-05 DIAGNOSIS — E039 Hypothyroidism, unspecified: Secondary | ICD-10-CM

## 2014-06-05 DIAGNOSIS — E785 Hyperlipidemia, unspecified: Secondary | ICD-10-CM | POA: Insufficient documentation

## 2014-06-12 LAB — HEMOGLOBIN A1C: Hgb A1c MFr Bld: 5.9 % (ref 4.0–6.0)

## 2014-06-21 NOTE — Progress Notes (Signed)
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  INTERVAL HISTORY: Patient returns to clinic today for repeat laboratory work and further evaluation.  She continues to feel well and is asymptomatic.  She has no neurologic complaints.  She denies any recent fevers or illnesses.  She denies any chest pain or shortness of breath.  She denies any weakness or fatigue.  She has a good appetite and denies weight loss. Patient offers no specific complaints today.  REVIEW OF SYSTEMS:   Review of Systems  Constitutional: Negative.   Respiratory: Negative.   Cardiovascular: Negative.     As per HPI. Otherwise, a complete review of systems is negatve.  PAST MEDICAL HISTORY: Past Medical History  Diagnosis Date  . HLD (hyperlipidemia)   . HTN (hypertension)   . Hypothyroidism   . Diabetes type 2, controlled     if controlled was not specified  . Obesity   . Hemochromatosis   . Thyroid disease   . Hyperlipidemia   . Hypertension     PAST SURGICAL HISTORY: Past Surgical History  Procedure Laterality Date  . Breast reduction surgery    . Cesarean section    . Lapcholecystectomy    . Vesicovaginal fistula closure w/ tah    . Blood transfusion yearly    . Cesarean section  1992  . Cholecystectomy  2000  . Abdominal hysterectomy  2005    FAMILY HISTORY Family History  Problem Relation Age of Onset  . Colon polyps Mother   . Hemachromatosis Mother   . Colon  polyps Brother        ADVANCED DIRECTIVES:    HEALTH MAINTENANCE: History  Substance Use Topics  . Smoking status: Never Smoker   . Smokeless tobacco: Not on file  . Alcohol Use: Yes     Colonoscopy:  PAP:  Bone density:  Lipid panel:  No Known Allergies  Current Outpatient Prescriptions  Medication Sig Dispense Refill  . amLODipine-benazepril (LOTREL) 5-20 MG per capsule Take 1 capsule by mouth daily.      Marland Kitchen amLODipine-benazepril (LOTREL) 5-40 MG per capsule Take 1 capsule by mouth daily.    Marland Kitchen aspirin 81 MG EC tablet Take 81 mg by mouth daily.      Marland Kitchen aspirin 81 MG tablet Take 81 mg by mouth daily.    Marland Kitchen levothyroxine (SYNTHROID, LEVOTHROID) 75 MCG tablet Take 75 mcg by mouth daily.      Marland Kitchen levothyroxine (SYNTHROID, LEVOTHROID) 75 MCG tablet Take 1 tablet by mouth daily.    . metFORMIN (GLUCOPHAGE) 500 MG tablet Take 500 mg by mouth daily.      . polyethylene glycol powder (GLYCOLAX/MIRALAX) powder Take 255 g by mouth once. 255 g 0  . rosuvastatin (CRESTOR) 20 MG tablet Take 20 mg by mouth daily.      Marland Kitchen spironolactone (ALDACTONE) 25 MG tablet Take 50 mg by mouth daily.      Marland Kitchen spironolactone (ALDACTONE) 25 MG tablet Take 1 tablet by mouth daily.     No current facility-administered medications for this visit.    OBJECTIVE:  There were no vitals filed for this visit.   There is no weight on file to calculate BMI.    ECOG FS:0 - Asymptomatic  General: Well-developed, well-nourished, no acute distress. Eyes: anicteric sclera. Lungs: Clear to auscultation bilaterally. Heart: Regular rate and rhythm. No rubs, murmurs, or gallops. Abdomen: Soft, nontender, nondistended. No organomegaly noted, normoactive bowel sounds. Musculoskeletal: No edema, cyanosis, or clubbing. Neuro: Alert, answering all questions appropriately. Cranial nerves grossly intact. Skin: No rashes or petechiae noted. Psych: Normal affect.    LAB RESULTS:  No results found for: NA, K, CL, CO2, GLUCOSE,  BUN, CREATININE, CALCIUM, PROT, ALBUMIN, AST, ALT, ALKPHOS, BILITOT, GFRNONAA, GFRAA  Lab Results  Component Value Date   WBC 8.8 03/08/2012   NEUTROABS 4.4 03/08/2012   HGB 16.1* 03/08/2012   HCT 45.5 03/08/2012   MCV 102* 03/08/2012   PLT 215 03/08/2012     STUDIES: No results found.  ASSESSMENT: Hemochromatosis.  PLAN:    1.  Hemachromatosis: Her ferritin is above her goal of 50-100. The remainder of her laboratory work continues to be within normal limits.  Patient will have 300 mL phlebotomy today. It appears that she only requires phlebotomy every 3 months. Return to clinic in 3 months for phlebotomy and further evaluation.   Patient expressed understanding and was in agreement with this plan. She also understands that She can call clinic at any time with any questions, concerns, or complaints.    Lloyd Huger, MD   06/21/2014 4:27 PM

## 2014-07-17 ENCOUNTER — Other Ambulatory Visit: Payer: Self-pay

## 2014-07-18 MED ORDER — BYDUREON 2 MG ~~LOC~~ PEN: PEN_INJECTOR | SUBCUTANEOUS | Status: DC

## 2014-08-30 ENCOUNTER — Encounter: Payer: Self-pay | Admitting: Oncology

## 2014-09-04 ENCOUNTER — Inpatient Hospital Stay: Payer: 59

## 2014-09-04 ENCOUNTER — Inpatient Hospital Stay: Payer: 59 | Attending: Oncology | Admitting: Oncology

## 2014-09-04 DIAGNOSIS — I10 Essential (primary) hypertension: Secondary | ICD-10-CM | POA: Insufficient documentation

## 2014-09-04 DIAGNOSIS — E039 Hypothyroidism, unspecified: Secondary | ICD-10-CM | POA: Diagnosis not present

## 2014-09-04 DIAGNOSIS — E119 Type 2 diabetes mellitus without complications: Secondary | ICD-10-CM | POA: Insufficient documentation

## 2014-09-04 DIAGNOSIS — E559 Vitamin D deficiency, unspecified: Secondary | ICD-10-CM | POA: Diagnosis not present

## 2014-09-04 DIAGNOSIS — E669 Obesity, unspecified: Secondary | ICD-10-CM | POA: Insufficient documentation

## 2014-09-04 DIAGNOSIS — M8580 Other specified disorders of bone density and structure, unspecified site: Secondary | ICD-10-CM | POA: Insufficient documentation

## 2014-09-04 DIAGNOSIS — E785 Hyperlipidemia, unspecified: Secondary | ICD-10-CM | POA: Insufficient documentation

## 2014-09-04 DIAGNOSIS — Z79899 Other long term (current) drug therapy: Secondary | ICD-10-CM | POA: Diagnosis not present

## 2014-09-04 NOTE — Progress Notes (Signed)
Patient here today for follow up regarding hemochromatosis, denies any concerns today.

## 2014-09-07 NOTE — Progress Notes (Signed)
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  INTERVAL HISTORY: Patient returns to clinic today for repeat laboratory work and further evaluation.  She continues to feel well and is asymptomatic.  She has no neurologic complaints.  She denies any recent fevers or illnesses.  She denies any chest pain or shortness of breath.  She denies any weakness or fatigue.  She has a good appetite and denies weight loss. Patient offers no specific complaints today.  REVIEW OF SYSTEMS:   Review of Systems  Constitutional: Negative.   Respiratory: Negative.   Cardiovascular: Negative.     As per HPI. Otherwise, a complete review of systems is negatve.  PAST MEDICAL HISTORY: Past Medical History  Diagnosis Date  . HLD (hyperlipidemia)   . HTN (hypertension)   . Hypothyroidism   . Diabetes type 2, controlled     if controlled was not specified  . Obesity   . Hemochromatosis   . Thyroid disease   . Hyperlipidemia   . Hypertension   . Osteopenia   . Snoring   . Hepatomegaly   . Acquired acanthosis nigricans   . Postinflammatory hyperpigmentation   . Intertrigo   . Vitamin D deficiency   . Hypertrophy of kidney     PAST SURGICAL HISTORY: Past Surgical History  Procedure Laterality Date  . Breast reduction surgery    . Cesarean section    . Lapcholecystectomy    . Vesicovaginal fistula closure w/ tah    . Blood transfusion yearly    . Cesarean section  1992  .  Cholecystectomy  2000  . Abdominal hysterectomy  2005    FAMILY HISTORY Family History  Problem Relation Age of Onset  . Colon polyps Mother   . Hemachromatosis Mother   . Hypertension Mother   . Colon polyps Brother   . Depression Sister   . Hypertension Brother   . Cancer Father        ADVANCED DIRECTIVES:    HEALTH MAINTENANCE: Social History  Substance Use Topics  . Smoking status: Never Smoker   . Smokeless tobacco: Not on file  . Alcohol Use: Yes     Colonoscopy:  PAP:  Bone density:  Lipid panel:  No Known Allergies  Current Outpatient Prescriptions  Medication Sig Dispense Refill  . amLODipine-benazepril (LOTREL) 5-20 MG per capsule Take 1 capsule by mouth daily.      Marland Kitchen aspirin 81 MG EC tablet Take 81 mg by mouth daily.      Marland Kitchen BYDUREON 2 MG PEN INJECT 1 PEN SUBCUTANEOUSLY WEEKLY FOR DM - TAKE IN PLACE OF KOMBYGLIZE 1 each 1  . levothyroxine (SYNTHROID, LEVOTHROID) 75 MCG tablet Take 75 mcg by mouth daily.      . polyethylene glycol powder (GLYCOLAX/MIRALAX) powder Take 255 g by mouth once. 255 g 0  . spironolactone (ALDACTONE) 25 MG tablet Take 50 mg by mouth daily.       No current facility-administered medications for this visit.    OBJECTIVE: Filed Vitals:   09/04/14 1506  BP: 124/81  Pulse: 79  Temp: 98.5 F (36.9 C)  Resp: 20     Body mass index is 40.46 kg/(m^2).    ECOG FS:0 - Asymptomatic  General: Well-developed, well-nourished, no acute distress. Eyes: anicteric sclera. Lungs: Clear to auscultation bilaterally. Heart: Regular rate and rhythm. No rubs, murmurs, or gallops. Abdomen: Soft, nontender, nondistended. No organomegaly noted, normoactive bowel sounds. Musculoskeletal: No edema, cyanosis, or clubbing. Neuro: Alert, answering all questions appropriately. Cranial nerves grossly intact. Skin: No rashes or petechiae noted. Psych: Normal affect.    LAB RESULTS:  No results found for: NA, K, CL, CO2, GLUCOSE, BUN, CREATININE,  CALCIUM, PROT, ALBUMIN, AST, ALT, ALKPHOS, BILITOT, GFRNONAA, GFRAA  Lab Results  Component Value Date   WBC 8.8 03/08/2012   NEUTROABS 4.4 03/08/2012   HGB 16.1* 03/08/2012   HCT 45.5 03/08/2012   MCV 102* 03/08/2012   PLT 215 03/08/2012     STUDIES: No results found.  ASSESSMENT: Hemochromatosis.  PLAN:    1.  Hemachromatosis: Her ferritin is 167 and above her goal of 50-100. The remainder of her laboratory work continues to be within normal limits.  Patient will have 300 mL phlebotomy today. It appears that she only requires phlebotomy every 3 months. Return to clinic in 3 months for phlebotomy and further evaluation.   Patient expressed understanding and was in agreement with this plan. She also understands that She can call clinic at any time with any questions, concerns, or complaints.    Lloyd Huger, MD   09/07/2014 1:25 PM

## 2014-09-09 ENCOUNTER — Encounter: Payer: Self-pay | Admitting: Family Medicine

## 2014-09-09 DIAGNOSIS — E785 Hyperlipidemia, unspecified: Secondary | ICD-10-CM | POA: Insufficient documentation

## 2014-09-09 DIAGNOSIS — M48062 Spinal stenosis, lumbar region with neurogenic claudication: Secondary | ICD-10-CM | POA: Insufficient documentation

## 2014-09-09 DIAGNOSIS — M8580 Other specified disorders of bone density and structure, unspecified site: Secondary | ICD-10-CM | POA: Insufficient documentation

## 2014-09-09 DIAGNOSIS — E1169 Type 2 diabetes mellitus with other specified complication: Secondary | ICD-10-CM | POA: Insufficient documentation

## 2014-09-09 DIAGNOSIS — R809 Proteinuria, unspecified: Secondary | ICD-10-CM | POA: Insufficient documentation

## 2014-09-09 DIAGNOSIS — R160 Hepatomegaly, not elsewhere classified: Secondary | ICD-10-CM | POA: Insufficient documentation

## 2014-09-09 DIAGNOSIS — E1129 Type 2 diabetes mellitus with other diabetic kidney complication: Secondary | ICD-10-CM | POA: Insufficient documentation

## 2014-09-09 DIAGNOSIS — N2881 Hypertrophy of kidney: Secondary | ICD-10-CM | POA: Insufficient documentation

## 2014-09-09 DIAGNOSIS — E039 Hypothyroidism, unspecified: Secondary | ICD-10-CM | POA: Insufficient documentation

## 2014-09-09 DIAGNOSIS — E559 Vitamin D deficiency, unspecified: Secondary | ICD-10-CM | POA: Insufficient documentation

## 2014-09-09 DIAGNOSIS — L83 Acanthosis nigricans: Secondary | ICD-10-CM | POA: Insufficient documentation

## 2014-09-09 DIAGNOSIS — R9431 Abnormal electrocardiogram [ECG] [EKG]: Secondary | ICD-10-CM | POA: Insufficient documentation

## 2014-09-10 ENCOUNTER — Other Ambulatory Visit: Payer: Self-pay

## 2014-09-10 MED ORDER — BYDUREON 2 MG ~~LOC~~ PEN: PEN_INJECTOR | SUBCUTANEOUS | Status: DC

## 2014-09-10 NOTE — Telephone Encounter (Signed)
Patient requesting refill. 

## 2014-09-12 ENCOUNTER — Encounter: Payer: Self-pay | Admitting: Family Medicine

## 2014-09-12 ENCOUNTER — Ambulatory Visit (INDEPENDENT_AMBULATORY_CARE_PROVIDER_SITE_OTHER): Payer: 59 | Admitting: Family Medicine

## 2014-09-12 VITALS — BP 128/86 | HR 86 | Temp 99.3°F | Resp 16 | Ht 67.0 in | Wt 261.1 lb

## 2014-09-12 DIAGNOSIS — R809 Proteinuria, unspecified: Secondary | ICD-10-CM

## 2014-09-12 DIAGNOSIS — I10 Essential (primary) hypertension: Secondary | ICD-10-CM | POA: Insufficient documentation

## 2014-09-12 DIAGNOSIS — E1122 Type 2 diabetes mellitus with diabetic chronic kidney disease: Secondary | ICD-10-CM | POA: Diagnosis not present

## 2014-09-12 DIAGNOSIS — E038 Other specified hypothyroidism: Secondary | ICD-10-CM | POA: Diagnosis not present

## 2014-09-12 DIAGNOSIS — Z1159 Encounter for screening for other viral diseases: Secondary | ICD-10-CM

## 2014-09-12 DIAGNOSIS — E785 Hyperlipidemia, unspecified: Secondary | ICD-10-CM | POA: Diagnosis not present

## 2014-09-12 DIAGNOSIS — E668 Other obesity: Secondary | ICD-10-CM

## 2014-09-12 DIAGNOSIS — E559 Vitamin D deficiency, unspecified: Secondary | ICD-10-CM | POA: Diagnosis not present

## 2014-09-12 DIAGNOSIS — Z114 Encounter for screening for human immunodeficiency virus [HIV]: Secondary | ICD-10-CM | POA: Diagnosis not present

## 2014-09-12 DIAGNOSIS — N189 Chronic kidney disease, unspecified: Secondary | ICD-10-CM | POA: Diagnosis not present

## 2014-09-12 DIAGNOSIS — Z0289 Encounter for other administrative examinations: Secondary | ICD-10-CM | POA: Diagnosis not present

## 2014-09-12 DIAGNOSIS — E669 Obesity, unspecified: Secondary | ICD-10-CM

## 2014-09-12 LAB — POCT GLYCOSYLATED HEMOGLOBIN (HGB A1C): Hemoglobin A1C: 5.9

## 2014-09-12 NOTE — Progress Notes (Signed)
Name: Tabitha Shaw   MRN: [id number]    DOB: 1960-01-29   Date:09/12/2014       Progress Note  Subjective  Chief Complaint  Chief Complaint  Patient presents with  . Diabetes    checks BG 2x day Low-100, High 125  . Hypertension  . Hyperlipidemia  . Hypothyroidism    HPI  DMII : using Bydureon and tolerating well, glucose has been around 100 fasting, she has been trying to be compliant with a diabetic diet, still drinking 12 ounces of soda daily.  Denies polyphagia, polydipsia or polyuria.   HTN: taking medication daily and denies side effects, she has not been checking her bp at home, bp at hematologist is always good.  Hyperlipidemia: taking Atorvastatin, no muscle aches, compliant   Hypothyroidism: she is taking Synthroid daily , denies constipation, dry skin, she is tired but works second shift  Extreme Obesity: no weight changes since last year, she walks daily for 30 minutes, she is drinking more water - at least 32 ounces daily.    Patient Active Problem List   Diagnosis Date Noted  . Benign hypertension 09/12/2014  . Acanthosis nigricans 09/09/2014  . Diabetes mellitus with renal manifestation 09/09/2014  . Dyslipidemia 09/09/2014  . Abnormal electrocardiogram 09/09/2014  . Enlarged kidney 09/09/2014  . Familial hemochromatosis 09/09/2014  . Enlarged liver 09/09/2014  . Calcium blood increased 09/09/2014  . Adult hypothyroidism 09/09/2014  . Microalbuminuria 09/09/2014  . Extreme obesity 09/09/2014  . Spinal stenosis of lumbar region with neurogenic claudication 09/09/2014  . Osteopenia 09/09/2014  . Vitamin D deficiency 09/09/2014  . ABNORMAL EKG 04/03/2009    Past Surgical History  Procedure Laterality Date  . Breast reduction surgery    . Cesarean section    . Lapcholecystectomy    . Vesicovaginal fistula closure w/ tah    . Blood transfusion yearly    . Cesarean section  1992  . Cholecystectomy  2000  . Abdominal hysterectomy  2005     Family History  Problem Relation Age of Onset  . Colon polyps Mother   . Hemachromatosis Mother   . Hypertension Mother   . Colon polyps Brother   . Depression Sister   . Hypertension Brother   . Cancer Father     Social History   Social History  . Marital Status: Married    Spouse Name: N/A  . Number of Children: N/A  . Years of Education: N/A   Occupational History  . Not on file.   Social History Main Topics  . Smoking status: Former Smoker    Types: Cigarettes    Quit date: 01/19/1990  . Smokeless tobacco: Not on file  . Alcohol Use: 0.0 oz/week    0 Standard drinks or equivalent per week  . Drug Use: No  . Sexual Activity: Yes   Other Topics Concern  . Not on file   Social History Narrative   ** Merged History Encounter **       Married; full time; does not get regular exercise.      Current outpatient prescriptions:  .  amLODipine-benazepril (LOTREL) 5-20 MG per capsule, Take 1 capsule by mouth daily.  , Disp: , Rfl:  .  aspirin 81 MG EC tablet, Take 81 mg by mouth daily.  , Disp: , Rfl:  .  atorvastatin (LIPITOR) 40 MG tablet, Take 1 tablet by mouth daily., Disp: , Rfl:  .  BYDUREON 2 MG PEN, INJECT 1 PEN SUBCUTANEOUSLY WEEKLY FOR  DM - TAKE IN PLACE OF KOMBYGLIZE, Disp: 4 each, Rfl: 0 .  levothyroxine (SYNTHROID, LEVOTHROID) 75 MCG tablet, Take 75 mcg by mouth daily.  , Disp: , Rfl:  .  spironolactone (ALDACTONE) 25 MG tablet, Take 50 mg by mouth daily.  , Disp: , Rfl:   No Known Allergies   ROS  Constitutional: Negative for fever or weight change.  Respiratory: Negative for cough and shortness of breath.   Cardiovascular: Negative for chest pain or palpitations.  Gastrointestinal: Negative for abdominal pain, no bowel changes.  Musculoskeletal: Negative for gait problem or joint swelling.  Skin: Negative for rash.  Neurological: Negative for dizziness or headache.  No other specific complaints in a complete review of systems (except as  listed in HPI above).  Objective  Filed Vitals:   09/12/14 0815  BP: 128/86  Pulse: 86  Temp: 99.3 F (37.4 C)  TempSrc: Oral  Resp: 16  Height: 5\' 7"  (1.702 m)  Weight: 261 lb 1.6 oz (118.434 kg)  SpO2: 96%    Body mass index is 40.88 kg/(m^2).  Physical Exam  Constitutional: Patient appears well-developed and well-nourished. Obese No distress.  HEENT: head atraumatic, normocephalic, pupils equal and reactive to light, neck supple, throat within normal limits Cardiovascular: Normal rate, regular rhythm and normal heart sounds.  No murmur heard. No BLE edema. Pulmonary/Chest: Effort normal and breath sounds normal. No respiratory distress. Abdominal: Soft.  There is no tenderness. Psychiatric: Patient has a normal mood and affect. behavior is normal. Judgment and thought content normal.   Recent Results (from the past 2160 hour(s))  POCT HgB A1C     Status: Normal   Collection Time: 09/12/14  8:25 AM  Result Value Ref Range   Hemoglobin A1C 5.9    Diabetic Foot Exam - Simple   Simple Foot Form  Visual Inspection  No deformities, no ulcerations, no other skin breakdown bilaterally:  Yes  Sensation Testing  Intact to touch and monofilament testing bilaterally:  Yes  Pulse Check  Posterior Tibialis and Dorsalis pulse intact bilaterally:  Yes  Comments      PHQ2/9: Depression screen PHQ 2/9 09/12/2014  Decreased Interest 0  Down, Depressed, Hopeless 0  PHQ - 2 Score 0    Fall Risk: Fall Risk  09/12/2014  Falls in the past year? No      Assessment & Plan  1. Type 2 diabetes mellitus with diabetic chronic kidney disease Glucose is great, be careful with hypoglycemia, discussed stopping medication, but she wants to hold off so she does not gain weight. Taking Ace for microalbuminuria - POCT HgB A1C   2. Dyslipidemia  - Lipid panel  3. Extreme obesity Discussed with the patient the risk posed by an increased BMI. Discussed importance of portion control,  calorie counting and at least 150 minutes of physical activity weekly. Avoid sweet beverages and drink more water. Eat at least 6 servings of fruit and vegetables daily  She tried Belviq but it did not work for her  She will try increasing the intake of fruit and vegetable to at least 3 servings daily   4. Vitamin D deficiency  - Vit D  25 hydroxy (rtn osteoporosis monitoring)  5. Other specified hypothyroidism  - TSH  6. Microalbuminuria  Recheck next visit  7. Calcium blood increased  Recheck level  8. Familial hemochromatosis  - CBC with Differential/Platelet  9. Encounter for screening for HIV  - HIV antibody  10. Need for hepatitis C screening test  -  Hepatitis C antibody  11. Physical examination of employee  - Nicotine/cotinine metabolites  12. Benign hypertension  At goal  - Comprehensive metabolic panel

## 2014-09-13 LAB — CBC WITH DIFFERENTIAL/PLATELET
Basophils Absolute: 0 10*3/uL (ref 0.0–0.2)
Basos: 1 %
EOS (ABSOLUTE): 0.1 10*3/uL (ref 0.0–0.4)
EOS: 1 %
HEMATOCRIT: 43.8 % (ref 34.0–46.6)
HEMOGLOBIN: 15.1 g/dL (ref 11.1–15.9)
Immature Grans (Abs): 0 10*3/uL (ref 0.0–0.1)
Immature Granulocytes: 0 %
LYMPHS ABS: 3 10*3/uL (ref 0.7–3.1)
Lymphs: 39 %
MCH: 35.5 pg — AB (ref 26.6–33.0)
MCHC: 34.5 g/dL (ref 31.5–35.7)
MCV: 103 fL — AB (ref 79–97)
MONOS ABS: 0.5 10*3/uL (ref 0.1–0.9)
Monocytes: 6 %
NEUTROS ABS: 4.1 10*3/uL (ref 1.4–7.0)
Neutrophils: 53 %
Platelets: 252 10*3/uL (ref 150–379)
RBC: 4.25 x10E6/uL (ref 3.77–5.28)
RDW: 13.6 % (ref 12.3–15.4)
WBC: 7.7 10*3/uL (ref 3.4–10.8)

## 2014-09-13 LAB — HEPATITIS C ANTIBODY

## 2014-09-13 LAB — HEMOGLOBIN A1C
ESTIMATED AVERAGE GLUCOSE: 126 mg/dL
HEMOGLOBIN A1C: 6 % — AB (ref 4.8–5.6)

## 2014-09-13 LAB — LIPID PANEL
Chol/HDL Ratio: 4.3 ratio units (ref 0.0–4.4)
Cholesterol, Total: 172 mg/dL (ref 100–199)
HDL: 40 mg/dL (ref >39)
LDL Calculated: 98 mg/dL (ref 0–99)
TRIGLYCERIDES: 168 mg/dL — AB (ref 0–149)
VLDL CHOLESTEROL CAL: 34 mg/dL (ref 5–40)

## 2014-09-13 LAB — COMPREHENSIVE METABOLIC PANEL
ALBUMIN: 4.3 g/dL (ref 3.5–5.5)
ALT: 38 IU/L — ABNORMAL HIGH (ref 0–32)
AST: 32 IU/L (ref 0–40)
Albumin/Globulin Ratio: 1.6 (ref 1.1–2.5)
Alkaline Phosphatase: 86 IU/L (ref 39–117)
BUN / CREAT RATIO: 9 (ref 9–23)
BUN: 6 mg/dL (ref 6–24)
Bilirubin Total: 0.9 mg/dL (ref 0.0–1.2)
CALCIUM: 10.4 mg/dL — AB (ref 8.7–10.2)
CO2: 24 mmol/L (ref 18–29)
CREATININE: 0.69 mg/dL (ref 0.57–1.00)
Chloride: 101 mmol/L (ref 97–108)
GFR calc Af Amer: 116 mL/min/{1.73_m2} (ref >59)
GFR, EST NON AFRICAN AMERICAN: 100 mL/min/{1.73_m2} (ref >59)
GLOBULIN, TOTAL: 2.7 g/dL (ref 1.5–4.5)
Glucose: 136 mg/dL — ABNORMAL HIGH (ref 65–99)
Potassium: 4.4 mmol/L (ref 3.5–5.2)
SODIUM: 141 mmol/L (ref 134–144)
Total Protein: 7 g/dL (ref 6.0–8.5)

## 2014-09-13 LAB — CALCIUM, IONIZED: CALCIUM ION: 5.5 mg/dL (ref 4.5–5.6)

## 2014-09-13 LAB — VITAMIN D 25 HYDROXY (VIT D DEFICIENCY, FRACTURES): VIT D 25 HYDROXY: 13.7 ng/mL — AB (ref 30.0–100.0)

## 2014-09-13 LAB — HIV ANTIBODY (ROUTINE TESTING W REFLEX): HIV Screen 4th Generation wRfx: NONREACTIVE

## 2014-09-13 LAB — TSH: TSH: 5.26 u[IU]/mL — ABNORMAL HIGH (ref 0.450–4.500)

## 2014-09-16 ENCOUNTER — Other Ambulatory Visit: Payer: Self-pay | Admitting: Family Medicine

## 2014-09-16 LAB — NICOTINE/COTININE METABOLITES
COTININE: 1.1 ng/mL
NICOTINE: NOT DETECTED ng/mL

## 2014-09-16 MED ORDER — ERGOCALCIFEROL 1.25 MG (50000 UT) PO CAPS: 50000.0000 [IU] | ORAL_CAPSULE | ORAL | Status: DC

## 2014-09-18 ENCOUNTER — Other Ambulatory Visit: Payer: Self-pay

## 2014-09-18 NOTE — Progress Notes (Signed)
PT notified

## 2014-10-12 ENCOUNTER — Other Ambulatory Visit: Payer: Self-pay

## 2014-10-12 MED ORDER — AMLODIPINE BESY-BENAZEPRIL HCL 5-40 MG PO CAPS: 1.0000 | ORAL_CAPSULE | Freq: Every day | ORAL | Status: DC

## 2014-10-12 MED ORDER — SPIRONOLACTONE 25 MG PO TABS: 25.0000 mg | ORAL_TABLET | Freq: Every day | ORAL | Status: DC

## 2014-10-12 MED ORDER — ATORVASTATIN CALCIUM 40 MG PO TABS: 40.0000 mg | ORAL_TABLET | Freq: Every day | ORAL | Status: DC

## 2014-10-12 MED ORDER — LEVOTHYROXINE SODIUM 75 MCG PO TABS: 75.0000 ug | ORAL_TABLET | Freq: Every day | ORAL | Status: DC

## 2014-10-12 NOTE — Telephone Encounter (Signed)
Patient requesting refill. 

## 2014-10-22 ENCOUNTER — Telehealth: Payer: Self-pay | Admitting: Family Medicine

## 2014-10-22 MED ORDER — BYDUREON 2 MG ~~LOC~~ PEN: PEN_INJECTOR | SUBCUTANEOUS | Status: DC

## 2014-10-22 NOTE — Telephone Encounter (Signed)
Pt needs refill on Bydureon to be sent to [facility]. Fax # [phone]

## 2014-11-05 ENCOUNTER — Ambulatory Visit (INDEPENDENT_AMBULATORY_CARE_PROVIDER_SITE_OTHER): Payer: 59 | Admitting: Family Medicine

## 2014-11-07 ENCOUNTER — Ambulatory Visit (INDEPENDENT_AMBULATORY_CARE_PROVIDER_SITE_OTHER): Payer: 59 | Admitting: Family Medicine

## 2014-11-07 ENCOUNTER — Encounter: Payer: Self-pay | Admitting: Family Medicine

## 2014-11-07 DIAGNOSIS — E038 Other specified hypothyroidism: Secondary | ICD-10-CM

## 2014-11-07 NOTE — Progress Notes (Signed)
Name: Tabitha Shaw   MRN: [id number]    DOB: 17-Oct-1960   Date:11/07/2014       Progress Note  Subjective  Chief Complaint  Chief Complaint  Patient presents with  . Obesity    [employer] form screening for weight loss    HPI  Morbid Obesity: she is walking now 30  Minutes per day, and getting 10000 steps on her fit bit daily, she is no longer drinking sodas daily - drinking about one or two weekly. She is eating two servings of fruit and 2 servings of vegetables daily. Drinking at least 32 ounces per daily. She has joint pains, DM and HTN, and hyperlipidemia, and losing weight will benefit her health  Hypothyroidism: last TSH was elevated, taking Synthroid 75 mcg daily, denies constipation, dry skin, palpitation, no diarrhea, no dysphagia.   Patient Active Problem List   Diagnosis Date Noted  . Benign hypertension 09/12/2014  . Acanthosis nigricans 09/09/2014  . Diabetes mellitus with renal manifestation ([place]) 09/09/2014  . Dyslipidemia 09/09/2014  . Abnormal electrocardiogram 09/09/2014  . Enlarged kidney 09/09/2014  . 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[place]) 09/09/2014  . Enlarged liver 09/09/2014  . Calcium blood increased 09/09/2014  . Adult hypothyroidism 09/09/2014  . Microalbuminuria 09/09/2014  . Extreme obesity ([place]) 09/09/2014  . Spinal stenosis of lumbar region with neurogenic claudication 09/09/2014  . Osteopenia 09/09/2014  . Vitamin D deficiency 09/09/2014    Past Surgical History  Procedure Laterality Date  . Breast reduction surgery    . Cesarean section    . Lapcholecystectomy    . Vesicovaginal fistula closure w/ tah    . Blood transfusion yearly    . Cesarean section  1992  . Cholecystectomy  2000  . Abdominal hysterectomy  2005    Family History  Problem Relation Age of Onset  . Colon polyps Mother   . Hemachromatosis Mother   . Hypertension Mother   . Colon polyps Brother   . Depression Sister   . Hypertension Brother   . Cancer Father      Social History   Social History  . Marital Status: Married    Spouse Name: N/A  . Number of Children: N/A  . Years of Education: N/A   Occupational History  . Not on file.   Social History Main Topics  . Smoking status: Former Smoker    Types: Cigarettes    Quit date: 01/19/1990  . Smokeless tobacco: Not on file  . Alcohol Use: 0.0 oz/week    0 Standard drinks or equivalent per week  . Drug Use: No  . Sexual Activity: Yes   Other Topics Concern  . Not on file   Social History Narrative   ** Merged History Encounter **       Married; full time; does not get regular exercise.      Current outpatient prescriptions:  .  amLODipine-benazepril (LOTREL) 5-40 MG per capsule, Take 1 capsule by mouth daily., Disp: 90 capsule, Rfl: 1 .  aspirin 81 MG EC tablet, Take 81 mg by mouth daily.  , Disp: , Rfl:  .  atorvastatin (LIPITOR) 40 MG tablet, Take 1 tablet (40 mg total) by mouth daily., Disp: 90 tablet, Rfl: 1 .  BYDUREON 2 MG PEN, INJECT 1 PEN SUBCUTANEOUSLY WEEKLY FOR DM - TAKE IN PLACE OF KOMBYGLIZE, Disp: 12 each, Rfl: 1 .  ergocalciferol (VITAMIN D2) 50000 UNITS capsule, Take 1 capsule (50,000 Units total) by mouth once a week., Disp: 12 capsule, Rfl:  0 .  levothyroxine (SYNTHROID, LEVOTHROID) 75 MCG tablet, Take 1 tablet (75 mcg total) by mouth daily., Disp: 90 tablet, Rfl: 1 .  spironolactone (ALDACTONE) 25 MG tablet, Take 1 tablet (25 mg total) by mouth daily., Disp: 90 tablet, Rfl: 1  No Known Allergies   ROS  Constitutional: Negative for fever, mild  weight change.  Respiratory: Negative for cough and shortness of breath.   Cardiovascular: Negative for chest pain or palpitations.  Gastrointestinal: Negative for abdominal pain, no bowel changes.  Musculoskeletal: Negative for gait problem or joint swelling.  Skin: Negative for rash.  Neurological: Negative for dizziness or headache.  No other specific complaints in a complete review of systems (except as  listed in HPI above).  Objective  Filed Vitals:   11/07/14 0920  BP: 122/72  Pulse: 91  Temp: 98 F (36.7 C)  TempSrc: Oral  Resp: 18  Height: 5\' 8"  (1.727 m)  Weight: 259 lb (117.482 kg)  SpO2: 98%    Body mass index is 39.39 kg/(m^2).  Physical Exam  Constitutional: Patient appears well-developed and well-nourished. Obese No distress.  HEENT: head atraumatic, normocephalic, pupils equal and reactive to light,  neck supple, throat within normal limits Cardiovascular: Normal rate, regular rhythm and normal heart sounds.  No murmur heard. No BLE edema. Pulmonary/Chest: Effort normal and breath sounds normal. No respiratory distress. Abdominal: Soft.  There is no tenderness. Psychiatric: Patient has a normal mood and affect. behavior is normal. Judgment and thought content normal.  Recent Results (from the past 2160 hour(s))  POCT HgB A1C     Status: Normal   Collection Time: 09/12/14  8:25 AM  Result Value Ref Range   Hemoglobin A1C 5.9   Comprehensive metabolic panel     Status: Abnormal   Collection Time: 09/12/14  9:02 AM  Result Value Ref Range   Glucose 136 (H) 65 - 99 mg/dL   BUN 6 6 - 24 mg/dL   Creatinine, Ser 0.69 0.57 - 1.00 mg/dL   GFR calc non Af Amer 100 >59 mL/min/1.73   GFR calc Af Amer 116 >59 mL/min/1.73   BUN/Creatinine Ratio 9 9 - 23   Sodium 141 134 - 144 mmol/L   Potassium 4.4 3.5 - 5.2 mmol/L   Chloride 101 97 - 108 mmol/L   CO2 24 18 - 29 mmol/L   Calcium 10.4 (H) 8.7 - 10.2 mg/dL   Total Protein 7.0 6.0 - 8.5 g/dL   Albumin 4.3 3.5 - 5.5 g/dL   Globulin, Total 2.7 1.5 - 4.5 g/dL   Albumin/Globulin Ratio 1.6 1.1 - 2.5   Bilirubin Total 0.9 0.0 - 1.2 mg/dL   Alkaline Phosphatase 86 39 - 117 IU/L   AST 32 0 - 40 IU/L   ALT 38 (H) 0 - 32 IU/L  TSH     Status: Abnormal   Collection Time: 09/12/14  9:02 AM  Result Value Ref Range   TSH 5.260 (H) 0.450 - 4.500 uIU/mL  Vit D  25 hydroxy (rtn osteoporosis monitoring)     Status: Abnormal    Collection Time: 09/12/14  9:02 AM  Result Value Ref Range   Vit D, 25-Hydroxy 13.7 (L) 30.0 - 100.0 ng/mL    Comment: Vitamin D deficiency has been defined by the Institute of Medicine and an Endocrine Society practice guideline as a level of serum 25-OH vitamin D less than 20 ng/mL (1,2). The Endocrine Society went on to further define vitamin D insufficiency as a level between 21  and 29 ng/mL (2). 1. IOM (Institute of Medicine). 2010. Dietary reference    intakes for calcium and D. [place]: The    [employer]. 2. Holick MF, Binkley [place], Bischoff-Ferrari HA, et al.    Evaluation, treatment, and prevention of vitamin D    deficiency: an Endocrine Society clinical practice    guideline. JCEM. 2011 Jul; 96(7):1911-30.   Lipid panel     Status: Abnormal   Collection Time: 09/12/14  9:02 AM  Result Value Ref Range   Cholesterol, Total 172 100 - 199 mg/dL   Triglycerides 168 (H) 0 - 149 mg/dL   HDL 40 >39 mg/dL    Comment: According to ATP-III Guidelines, HDL-C >59 mg/dL is considered a negative risk factor for CHD.    VLDL Cholesterol Cal 34 5 - 40 mg/dL   LDL Calculated 98 0 - 99 mg/dL   Chol/HDL Ratio 4.3 0.0 - 4.4 ratio units    Comment:                                   T. Chol/HDL Ratio                                             Men  Women                               1/2 Avg.Risk  3.4    3.3                                   Avg.Risk  5.0    4.4                                2X Avg.Risk  9.6    7.1                                3X Avg.Risk 23.4   11.0   CBC with Differential/Platelet     Status: Abnormal   Collection Time: 09/12/14  9:02 AM  Result Value Ref Range   WBC 7.7 3.4 - 10.8 x10E3/uL   RBC 4.25 3.77 - 5.28 x10E6/uL   Hemoglobin 15.1 11.1 - 15.9 g/dL   Hematocrit 43.8 34.0 - 46.6 %   MCV 103 (H) 79 - 97 fL   MCH 35.5 (H) 26.6 - 33.0 pg   MCHC 34.5 31.5 - 35.7 g/dL   RDW 13.6 12.3 - 15.4 %   Platelets 252 150 - 379 x10E3/uL   Neutrophils  53 %   Lymphs 39 %   Monocytes 6 %   Eos 1 %   Basos 1 %   Neutrophils Absolute 4.1 1.4 - 7.0 x10E3/uL   Lymphocytes Absolute 3.0 0.7 - 3.1 x10E3/uL   Monocytes Absolute 0.5 0.1 - 0.9 x10E3/uL   EOS (ABSOLUTE) 0.1 0.0 - 0.4 x10E3/uL   Basophils Absolute 0.0 0.0 - 0.2 x10E3/uL   Immature Granulocytes 0 %   Immature Grans (Abs) 0.0 0.0 - 0.1 x10E3/uL  HIV antibody     Status: None  Collection Time: 09/12/14  9:02 AM  Result Value Ref Range   HIV Screen 4th Generation wRfx Non Reactive Non Reactive  Nicotine/cotinine metabolites     Status: None   Collection Time: 09/12/14  9:02 AM  Result Value Ref Range   Nicotine None Detected ng/mL    Comment: Nicotine levels greater than 2.0 are consistent with the use of tobacco or tobacco cessation products.    Cotinine 1.1 ng/mL    Comment: Cotinine levels greater than 20.0 are consistent with the use of tobacco or tobacco cessation products.   Hepatitis C antibody     Status: None   Collection Time: 09/12/14  9:02 AM  Result Value Ref Range   Hep C Virus Ab <0.1 0.0 - 0.9 s/co ratio    Comment:                                   Negative:     < 0.8                              Indeterminate: 0.8 - 0.9                                   Positive:     > 0.9  The CDC recommends that a positive HCV antibody result  be followed up with a HCV Nucleic Acid Amplification  test (295621).   Hemoglobin A1c     Status: Abnormal   Collection Time: 09/12/14  9:02 AM  Result Value Ref Range   Hgb A1c MFr Bld 6.0 (H) 4.8 - 5.6 %    Comment:          Pre-diabetes: 5.7 - 6.4          Diabetes: >6.4          Glycemic control for adults with diabetes: <7.0    Est. average glucose Bld gHb Est-mCnc 126 mg/dL  Calcium, ionized     Status: None   Collection Time: 09/12/14  9:02 AM  Result Value Ref Range   Calcium, Ion 5.5 4.5 - 5.6 mg/dL     PHQ2/9: Depression screen PHQ 2/9 09/12/2014  Decreased Interest 0  Down, Depressed, Hopeless 0  PHQ - 2  Score 0     Fall Risk: Fall Risk  09/12/2014  Falls in the past year? No     Assessment & Plan  1. Morbid obesity due to excess calories [facility])  Discussed with the patient the risk posed by an increased BMI. Discussed importance of portion control, calorie counting and at least 150 minutes of physical activity weekly. Avoid sweet beverages and drink more water. Eat at least 6 servings of fruit and vegetables daily  She is doing well, filled e-screen forms, but will need to drop another 10 lbs by next year so I can continue filling them out.   2. Other specified hypothyroidism  Recheck TSH

## 2014-12-04 ENCOUNTER — Encounter: Payer: Self-pay | Admitting: Oncology

## 2014-12-04 LAB — TSH: TSH: 1.62 u[IU]/mL (ref 0.450–4.500)

## 2014-12-05 ENCOUNTER — Inpatient Hospital Stay: Payer: 59

## 2014-12-05 ENCOUNTER — Inpatient Hospital Stay: Payer: 59 | Attending: Oncology | Admitting: Oncology

## 2014-12-05 DIAGNOSIS — E785 Hyperlipidemia, unspecified: Secondary | ICD-10-CM | POA: Diagnosis not present

## 2014-12-05 DIAGNOSIS — E559 Vitamin D deficiency, unspecified: Secondary | ICD-10-CM | POA: Insufficient documentation

## 2014-12-05 DIAGNOSIS — Z79899 Other long term (current) drug therapy: Secondary | ICD-10-CM

## 2014-12-05 DIAGNOSIS — E669 Obesity, unspecified: Secondary | ICD-10-CM | POA: Diagnosis not present

## 2014-12-05 DIAGNOSIS — E119 Type 2 diabetes mellitus without complications: Secondary | ICD-10-CM | POA: Insufficient documentation

## 2014-12-05 DIAGNOSIS — Z87891 Personal history of nicotine dependence: Secondary | ICD-10-CM | POA: Diagnosis not present

## 2014-12-05 DIAGNOSIS — E039 Hypothyroidism, unspecified: Secondary | ICD-10-CM | POA: Insufficient documentation

## 2014-12-05 DIAGNOSIS — M8580 Other specified disorders of bone density and structure, unspecified site: Secondary | ICD-10-CM | POA: Insufficient documentation

## 2014-12-05 DIAGNOSIS — I10 Essential (primary) hypertension: Secondary | ICD-10-CM | POA: Insufficient documentation

## 2014-12-05 NOTE — Progress Notes (Signed)
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  INTERVAL HISTORY: Patient returns to clinic today for repeat laboratory work and further evaluation.  She continues to feel well and is asymptomatic.  She has no neurologic complaints.  She denies any recent fevers or illnesses.  She denies any chest pain or shortness of breath.  She denies any weakness or fatigue.  She has a good appetite and denies weight loss. Patient offers no specific complaints today.  REVIEW OF SYSTEMS:   Review of Systems  Constitutional: Negative.   Respiratory: Negative.   Cardiovascular: Negative.   Musculoskeletal: Negative.   Neurological: Negative.     As per HPI. Otherwise, a complete review of systems is negatve.  PAST MEDICAL HISTORY: Past Medical History  Diagnosis Date  . HLD (hyperlipidemia)   . HTN (hypertension)   . Hypothyroidism   . Diabetes type 2, controlled ([place])     if controlled was not specified  . Obesity   . Hemochromatosis   . Thyroid disease   . Hyperlipidemia   . Hypertension   . Osteopenia   . Snoring   . Hepatomegaly   . Acquired acanthosis nigricans   . Postinflammatory hyperpigmentation   . Intertrigo   . Vitamin D deficiency   . Hypertrophy of kidney     PAST SURGICAL HISTORY: Past Surgical History  Procedure Laterality Date  . Breast reduction surgery    . Cesarean section    . Lapcholecystectomy    . Vesicovaginal fistula closure w/ tah     . Blood transfusion yearly    . Cesarean section  1992  . Cholecystectomy  2000  . Abdominal hysterectomy  2005    FAMILY HISTORY Family History  Problem Relation Age of Onset  . Colon polyps Mother   . Hemachromatosis Mother   . Hypertension Mother   . Colon polyps Brother   . Depression Sister   . Hypertension Brother   . Cancer Father        ADVANCED DIRECTIVES:    HEALTH MAINTENANCE: Social History  Substance Use Topics  . Smoking status: Former Smoker    Types: Cigarettes    Quit date: 01/19/1990  . Smokeless tobacco: Not on file  . Alcohol Use: 0.0 oz/week    0 Standard drinks or equivalent per week     Colonoscopy:  PAP:  Bone density:  Lipid panel:  No Known Allergies  Current Outpatient Prescriptions  Medication Sig Dispense Refill  . amLODipine-benazepril (LOTREL) 5-40 MG per capsule Take 1 capsule by mouth daily. 90 capsule 1  . aspirin 81 MG EC tablet Take 81 mg by mouth daily.      Marland Kitchen atorvastatin (LIPITOR) 40 MG tablet Take 1 tablet (40 mg total) by mouth daily. 90 tablet 1  . BYDUREON 2 MG PEN INJECT 1 PEN SUBCUTANEOUSLY WEEKLY FOR DM - TAKE IN PLACE OF KOMBYGLIZE 12 each 1  . ergocalciferol (VITAMIN D2) 50000 UNITS capsule Take 1 capsule (50,000 Units total) by mouth once a week. 12 capsule 0  . levothyroxine (SYNTHROID, LEVOTHROID) 75 MCG  tablet Take 1 tablet (75 mcg total) by mouth daily. 90 tablet 1  . spironolactone (ALDACTONE) 25 MG tablet Take 1 tablet (25 mg total) by mouth daily. 90 tablet 1   No current facility-administered medications for this visit.    OBJECTIVE: Filed Vitals:   12/05/14 1408  BP: 106/72  Pulse: 100  Temp: 97.6 F (36.4 C)  Resp: 16     Body mass index is 39.56 kg/(m^2).    ECOG FS:0 - Asymptomatic  General: Well-developed, well-nourished, no acute distress. Eyes: anicteric sclera. Lungs: Clear to auscultation bilaterally. Heart: Regular rate and rhythm. No rubs, murmurs, or gallops. Abdomen: Soft,  nontender, nondistended. No organomegaly noted, normoactive bowel sounds. Musculoskeletal: No edema, cyanosis, or clubbing. Neuro: Alert, answering all questions appropriately. Cranial nerves grossly intact. Skin: No rashes or petechiae noted. Psych: Normal affect.    LAB RESULTS:  Lab Results  Component Value Date   NA 141 09/12/2014   K 4.4 09/12/2014   CL 101 09/12/2014   CO2 24 09/12/2014   GLUCOSE 136* 09/12/2014   BUN 6 09/12/2014   CREATININE 0.69 09/12/2014   CALCIUM 10.4* 09/12/2014   PROT 7.0 09/12/2014   ALBUMIN 4.3 09/12/2014   AST 32 09/12/2014   ALT 38* 09/12/2014   ALKPHOS 86 09/12/2014   BILITOT 0.9 09/12/2014   GFRNONAA 100 09/12/2014   GFRAA 116 09/12/2014    Lab Results  Component Value Date   WBC 7.7 09/12/2014   NEUTROABS 4.1 09/12/2014   HGB 16.1* 03/08/2012   HCT 43.8 09/12/2014   MCV 102* 03/08/2012   PLT 215 03/08/2012     STUDIES: No results found.  ASSESSMENT: Hemochromatosis.  PLAN:    1.  Hemachromatosis: Her ferritin is 168 and above her goal of 50-100. The remainder of her laboratory work continues to be within normal limits.  Proceed with 300 mL phlebotomy today. It appears that she only requires phlebotomy every 3 months. Return to clinic in 3 months for phlebotomy and then in 6 months for phlebotomy and further evaluation.   Patient expressed understanding and was in agreement with this plan. She also understands that She can call clinic at any time with any questions, concerns, or complaints.    Lloyd Huger, MD   12/05/2014 11:54 PM

## 2014-12-20 ENCOUNTER — Ambulatory Visit (INDEPENDENT_AMBULATORY_CARE_PROVIDER_SITE_OTHER): Payer: 59 | Admitting: Family Medicine

## 2014-12-20 ENCOUNTER — Encounter: Payer: Self-pay | Admitting: Family Medicine

## 2014-12-20 VITALS — BP 140/70 | HR 91 | Temp 98.3°F | Resp 14 | Ht 68.0 in | Wt 261.4 lb

## 2014-12-20 DIAGNOSIS — E785 Hyperlipidemia, unspecified: Secondary | ICD-10-CM

## 2014-12-20 DIAGNOSIS — E1122 Type 2 diabetes mellitus with diabetic chronic kidney disease: Secondary | ICD-10-CM | POA: Diagnosis not present

## 2014-12-20 DIAGNOSIS — I10 Essential (primary) hypertension: Secondary | ICD-10-CM

## 2014-12-20 DIAGNOSIS — N181 Chronic kidney disease, stage 1: Secondary | ICD-10-CM | POA: Diagnosis not present

## 2014-12-20 DIAGNOSIS — E038 Other specified hypothyroidism: Secondary | ICD-10-CM

## 2014-12-20 LAB — POCT GLYCOSYLATED HEMOGLOBIN (HGB A1C): HEMOGLOBIN A1C: 5.4

## 2014-12-20 LAB — POCT UA - MICROALBUMIN: Microalbumin Ur, POC: 20 mg/L

## 2014-12-20 NOTE — Progress Notes (Signed)
Name: Tabitha Shaw   MRN: [id number]    DOB: November 01, 1960   Date:12/20/2014       Progress Note  Subjective  Chief Complaint  Chief Complaint  Patient presents with  . Medication Refill    folow-up  . Diabetes    checks BG 2x daily avg-120, low-95, high-130  . Hypertension  . Hyperlipidemia  . Hypothyroidism    HPI  DMII : using Bydureon and tolerating well, glucose has been 90's-120's, no hypoglycemic episodes.  She has been trying to be compliant with a diabetic diet, cutting down on soda intake, down to once or twice weekly a 12 ounce drink. Denies polyphagia, polydipsia or polyuria.   HTN: taking medication daily and denies side effects, she has not been checking her bp at home, no chest pain or palpitation   Hyperlipidemia: taking Atorvastatin, no muscle aches, compliant , reviewed labs with patient  Hypothyroidism: she is taking Synthroid daily , denies constipation she has dry skin. Last TSH at goal   Extreme Obesity: no weight changes since last year, she walks daily for 30 minutes, she is drinking more water - at least 32 ounces daily. She gained 1 lbs since last visit. Explained the importance to lose at least 10 lbs by next year.   Hematochromatosis: sees hematologist every three months.   Patient Active Problem List   Diagnosis Date Noted  . Benign hypertension 09/12/2014  . Acanthosis nigricans 09/09/2014  . Diabetes mellitus with renal manifestation ([place]) 09/09/2014  . Dyslipidemia 09/09/2014  . Abnormal electrocardiogram 09/09/2014  . Enlarged kidney 09/09/2014  . 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[place]) 09/09/2014  . Enlarged liver 09/09/2014  . Calcium blood increased 09/09/2014  . Adult hypothyroidism 09/09/2014  . Microalbuminuria 09/09/2014  . Extreme obesity ([place]) 09/09/2014  . Spinal stenosis of lumbar region with neurogenic claudication 09/09/2014  . Osteopenia 09/09/2014  . Vitamin D deficiency 09/09/2014    Past Surgical History  Procedure  Laterality Date  . Breast reduction surgery    . Cesarean section    . Lapcholecystectomy    . Vesicovaginal fistula closure w/ tah    . Blood transfusion yearly    . Cesarean section  1992  . Cholecystectomy  2000  . Abdominal hysterectomy  2005    Family History  Problem Relation Age of Onset  . Colon polyps Mother   . Hemachromatosis Mother   . Hypertension Mother   . Colon polyps Brother   . Depression Sister   . Hypertension Brother   . Cancer Father     Social History   Social History  . Marital Status: Married    Spouse Name: N/A  . Number of Children: N/A  . Years of Education: N/A   Occupational History  . Not on file.   Social History Main Topics  . Smoking status: Former Smoker    Types: Cigarettes    Quit date: 01/19/1990  . Smokeless tobacco: Not on file  . Alcohol Use: 0.0 oz/week    0 Standard drinks or equivalent per week  . Drug Use: No  . Sexual Activity: Yes   Other Topics Concern  . Not on file   Social History Narrative   ** Merged History Encounter **       Married; full time; does not get regular exercise.      Current outpatient prescriptions:  .  amLODipine-benazepril (LOTREL) 5-40 MG per capsule, Take 1 capsule by mouth daily., Disp: 90 capsule, Rfl: 1 .  aspirin 81  MG EC tablet, Take 81 mg by mouth daily.  , Disp: , Rfl:  .  atorvastatin (LIPITOR) 40 MG tablet, Take 1 tablet (40 mg total) by mouth daily., Disp: 90 tablet, Rfl: 1 .  BYDUREON 2 MG PEN, INJECT 1 PEN SUBCUTANEOUSLY WEEKLY FOR DM - TAKE IN PLACE OF KOMBYGLIZE, Disp: 12 each, Rfl: 1 .  ergocalciferol (VITAMIN D2) 50000 UNITS capsule, Take 1 capsule (50,000 Units total) by mouth once a week., Disp: 12 capsule, Rfl: 0 .  levothyroxine (SYNTHROID, LEVOTHROID) 75 MCG tablet, Take 1 tablet (75 mcg total) by mouth daily., Disp: 90 tablet, Rfl: 1 .  spironolactone (ALDACTONE) 25 MG tablet, Take 1 tablet (25 mg total) by mouth daily., Disp: 90 tablet, Rfl: 1  No Known  Allergies   ROS  Constitutional: Negative for fever or significant  weight change.  Respiratory: Negative for cough and shortness of breath.   Cardiovascular: Negative for chest pain or palpitations.  Gastrointestinal: Negative for abdominal pain, no bowel changes.  Musculoskeletal: Negative for gait problem or joint swelling.  Skin: Negative for rash.  Neurological: Negative for dizziness or headache.  No other specific complaints in a complete review of systems (except as listed in HPI above).  Objective  Filed Vitals:   12/20/14 1633  BP: 140/70  Pulse: 91  Temp: 98.3 F (36.8 C)  TempSrc: Oral  Resp: 14  Height: 5\' 8"  (1.727 m)  Weight: 261 lb 6.4 oz (118.57 kg)  SpO2: 95%    Body mass index is 39.75 kg/(m^2).  Physical Exam  Constitutional: Patient appears well-developed and well-nourished. Obese  No distress.  HEENT: head atraumatic, normocephalic, pupils equal and reactive to light,  neck supple, throat within normal limits Cardiovascular: Normal rate, regular rhythm and normal heart sounds.  No murmur heard. No BLE edema. Pulmonary/Chest: Effort normal and breath sounds normal. No respiratory distress. Abdominal: Soft.  There is no tenderness. Psychiatric: Patient has a normal mood and affect. behavior is normal. Judgment and thought content normal.  Recent Results (from the past 2160 hour(s))  TSH     Status: None   Collection Time: 12/03/14  4:26 PM  Result Value Ref Range   TSH 1.620 0.450 - 4.500 uIU/mL  POCT HgB A1C     Status: Normal   Collection Time: 12/20/14  4:35 PM  Result Value Ref Range   Hemoglobin A1C 5.4   POCT UA - Microalbumin     Status: Abnormal   Collection Time: 12/20/14  4:35 PM  Result Value Ref Range   Microalbumin Ur, POC 20 mg/L   Creatinine, POC  mg/dL   Albumin/Creatinine Ratio, Urine, POC       PHQ2/9: Depression screen [facility] 2/9 09/12/2014  Decreased Interest 0  Down, Depressed, Hopeless 0  PHQ - 2 Score 0    Fall  Risk: Fall Risk  09/12/2014  Falls in the past year? No    Assessment & Plan  1. 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 Continue Ace. No recent episodes of hypoglycemia - POCT HgB A1C - POCT UA - Microalbumin  2. Dyslipidemia  Continue medication   3. 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 Morbid obesity due to excess calories ([place])  Discussed with the patient the risk posed by an increased BMI. Discussed importance of portion control, calorie counting and at least 150 minutes of physical activity weekly. Avoid sweet beverages and drink more water. Eat at least 6 servings of fruit and vegetables daily   5. Other specified hypothyroidism  Continue current dose of Synthroid

## 2015-02-01 ENCOUNTER — Encounter: Payer: Self-pay | Admitting: Family Medicine

## 2015-02-01 ENCOUNTER — Ambulatory Visit (INDEPENDENT_AMBULATORY_CARE_PROVIDER_SITE_OTHER): Payer: 59 | Admitting: Family Medicine

## 2015-02-01 VITALS — BP 126/84 | HR 94 | Temp 98.1°F | Resp 18 | Wt 262.5 lb

## 2015-02-01 DIAGNOSIS — J069 Acute upper respiratory infection, unspecified: Secondary | ICD-10-CM | POA: Diagnosis not present

## 2015-02-01 DIAGNOSIS — H6501 Acute serous otitis media, right ear: Secondary | ICD-10-CM

## 2015-02-01 DIAGNOSIS — R42 Dizziness and giddiness: Secondary | ICD-10-CM

## 2015-02-01 MED ORDER — FLUTICASONE PROPIONATE 50 MCG/ACT NA SUSP: 2.0000 | Freq: Every day | NASAL | Status: DC

## 2015-02-01 MED ORDER — HYDROCOD POLST-CPM POLST ER 10-8 MG/5ML PO SUER: 5.0000 mL | Freq: Two times a day (BID) | ORAL | Status: DC | PRN

## 2015-02-01 NOTE — Progress Notes (Signed)
Name: Tabitha Shaw   MRN: [id number]    DOB: 05/31/1960   Date:02/01/2015       Progress Note  Subjective  Chief Complaint  Chief Complaint  Patient presents with  . URI    cold sx for about a week. sneezing, congestion, productice cough (thick green mucus), scratchy throat, left ear pain off and on. otc: mucinex    HPI  URI: she has been sick for one week, she states started with nasal congestion, sneezing, scratchy throat, hoarseness. She has a dry cough, she states sometimes has a green phlegm - it feels like it is in the back of her throat. Intermittent left ear pain. Takes prn Mucinex. She states she is feeling better, but is still having a lot of cough that keeps her up at night. She only missed one day of work. She is still feeling tired, but has not been sleeping well at night.   Vertigo: she states that over the past 2 weeks she has noticed that when she turns in bed the room spins, lasts a few seconds and resolves by itself. No nausea, tinnitus  or hearing loss. Episodes about two or three times weekly. She had similar symptoms in the past but never discussed it with me.    Patient Active Problem List   Diagnosis Date Noted  . Benign hypertension 09/12/2014  . Acanthosis nigricans 09/09/2014  . Diabetes mellitus with renal manifestation ([place]) 09/09/2014  . Dyslipidemia 09/09/2014  . Abnormal electrocardiogram 09/09/2014  . Enlarged kidney 09/09/2014  . 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[place]) 09/09/2014  . Enlarged liver 09/09/2014  . Calcium blood increased 09/09/2014  . Adult hypothyroidism 09/09/2014  . Microalbuminuria 09/09/2014  . Extreme obesity ([place]) 09/09/2014  . Spinal stenosis of lumbar region with neurogenic claudication 09/09/2014  . Osteopenia 09/09/2014  . Vitamin D deficiency 09/09/2014    Past Surgical History  Procedure Laterality Date  . Breast reduction surgery    . Cesarean section    . Lapcholecystectomy    . Vesicovaginal fistula closure w/ tah     . Blood transfusion yearly    . Cesarean section  1992  . Cholecystectomy  2000  . Abdominal hysterectomy  2005    Family History  Problem Relation Age of Onset  . Colon polyps Mother   . Hemachromatosis Mother   . Hypertension Mother   . Colon polyps Brother   . Depression Sister   . Hypertension Brother   . Cancer Father     Social History   Social History  . Marital Status: Married    Spouse Name: N/A  . Number of Children: N/A  . Years of Education: N/A   Occupational History  . Not on file.   Social History Main Topics  . Smoking status: Former Smoker    Types: Cigarettes    Quit date: 01/19/1990  . Smokeless tobacco: Not on file  . Alcohol Use: 0.0 oz/week    0 Standard drinks or equivalent per week  . Drug Use: No  . Sexual Activity: Yes   Other Topics Concern  . Not on file   Social History Narrative   ** Merged History Encounter **       Married; full time; does not get regular exercise.      Current outpatient prescriptions:  .  amLODipine-benazepril (LOTREL) 5-40 MG per capsule, Take 1 capsule by mouth daily., Disp: 90 capsule, Rfl: 1 .  aspirin 81 MG EC tablet, Take 81 mg by mouth daily.  ,  Disp: , Rfl:  .  atorvastatin (LIPITOR) 40 MG tablet, Take 1 tablet (40 mg total) by mouth daily., Disp: 90 tablet, Rfl: 1 .  BYDUREON 2 MG PEN, INJECT 1 PEN SUBCUTANEOUSLY WEEKLY FOR DM - TAKE IN PLACE OF KOMBYGLIZE, Disp: 12 each, Rfl: 1 .  ergocalciferol (VITAMIN D2) 50000 UNITS capsule, Take 1 capsule (50,000 Units total) by mouth once a week., Disp: 12 capsule, Rfl: 0 .  levothyroxine (SYNTHROID, LEVOTHROID) 75 MCG tablet, Take 1 tablet (75 mcg total) by mouth daily., Disp: 90 tablet, Rfl: 1 .  spironolactone (ALDACTONE) 25 MG tablet, Take 1 tablet (25 mg total) by mouth daily., Disp: 90 tablet, Rfl: 1  No Known Allergies   ROS  Ten systems reviewed and is negative except as mentioned in HPI   Objective  Filed Vitals:   02/01/15 1426 02/01/15  1430  BP: 126/84   Pulse: 110 94  Temp: 98.1 F (36.7 C)   TempSrc: Oral   Resp: 18   Weight: 262 lb 8 oz (119.069 kg)   SpO2: 98%     Body mass index is 39.92 kg/(m^2).  Physical Exam  Constitutional: Patient appears well-developed and well-nourished. Obese  No distress.  HEENT: head atraumatic, normocephalic, pupils equal and reactive to light, ears TM  Right side shows air fluid level, neck supple, throat within normal limits Cardiovascular: Normal rate, regular rhythm and normal heart sounds.  No murmur heard. No BLE edema. Pulmonary/Chest: Effort normal and breath sounds normal. No respiratory distress. Abdominal: Soft.  There is no tenderness. Psychiatric: Patient has a normal mood and affect. behavior is normal. Judgment and thought content normal. Neuro: no nystagmus, normal balance, Romberg negative  Recent Results (from the past 2160 hour(s))  TSH     Status: None   Collection Time: 12/03/14  4:26 PM  Result Value Ref Range   TSH 1.620 0.450 - 4.500 uIU/mL  POCT HgB A1C     Status: Normal   Collection Time: 12/20/14  4:35 PM  Result Value Ref Range   Hemoglobin A1C 5.4   POCT UA - Microalbumin     Status: Abnormal   Collection Time: 12/20/14  4:35 PM  Result Value Ref Range   Microalbumin Ur, POC 20 mg/L   Creatinine, POC  mg/dL   Albumin/Creatinine Ratio, Urine, POC       PHQ2/9: Depression screen [facility] 2/9 02/01/2015 09/12/2014  Decreased Interest 0 0  Down, Depressed, Hopeless 0 0  PHQ - 2 Score 0 0    Fall Risk: Fall Risk  02/01/2015 09/12/2014  Falls in the past year? No No    Functional Status Survey: Is the patient deaf or have difficulty hearing?: No Does the patient have difficulty seeing, even when wearing glasses/contacts?: No Does the patient have difficulty concentrating, remembering, or making decisions?: No Does the patient have difficulty walking or climbing stairs?: No Does the patient have difficulty dressing or bathing?: No Does the  patient have difficulty doing errands alone such as visiting a doctor's office or shopping?: No    Assessment & Plan   1. Upper respiratory infection  Explained likely viral, we will treat her worse symptom and call back if no improvement - chlorpheniramine-HYDROcodone (TUSSIONEX PENNKINETIC ER) 10-8 MG/5ML SUER; Take 5 mLs by mouth every 12 (twelve) hours as needed for cough.  Dispense: 140 mL; Refill: 0  2. Right acute serous otitis media, recurrence not specified  - fluticasone (FLONASE) 50 MCG/ACT nasal spray; Place 2 sprays into both nostrils  daily.  Dispense: 16 g; Refill: 0  3. Vertigo  - Ambulatory referral to ENT

## 2015-03-05 ENCOUNTER — Encounter: Payer: Self-pay | Admitting: Oncology

## 2015-03-07 ENCOUNTER — Inpatient Hospital Stay: Payer: 59 | Attending: Oncology

## 2015-03-07 ENCOUNTER — Other Ambulatory Visit: Payer: Self-pay | Admitting: Oncology

## 2015-03-07 DIAGNOSIS — Z79899 Other long term (current) drug therapy: Secondary | ICD-10-CM | POA: Insufficient documentation

## 2015-03-10 ENCOUNTER — Other Ambulatory Visit: Payer: Self-pay | Admitting: Family Medicine

## 2015-03-20 ENCOUNTER — Ambulatory Visit (INDEPENDENT_AMBULATORY_CARE_PROVIDER_SITE_OTHER): Payer: 59 | Admitting: Family Medicine

## 2015-03-20 ENCOUNTER — Encounter: Payer: Self-pay | Admitting: Family Medicine

## 2015-03-20 VITALS — BP 118/76 | HR 80 | Temp 98.1°F | Resp 18 | Ht 68.0 in | Wt 257.6 lb

## 2015-03-20 DIAGNOSIS — R42 Dizziness and giddiness: Secondary | ICD-10-CM

## 2015-03-20 DIAGNOSIS — E785 Hyperlipidemia, unspecified: Secondary | ICD-10-CM | POA: Diagnosis not present

## 2015-03-20 DIAGNOSIS — E038 Other specified hypothyroidism: Secondary | ICD-10-CM | POA: Diagnosis not present

## 2015-03-20 DIAGNOSIS — E1121 Type 2 diabetes mellitus with diabetic nephropathy: Secondary | ICD-10-CM | POA: Diagnosis not present

## 2015-03-20 DIAGNOSIS — Z1239 Encounter for other screening for malignant neoplasm of breast: Secondary | ICD-10-CM | POA: Diagnosis not present

## 2015-03-20 DIAGNOSIS — I10 Essential (primary) hypertension: Secondary | ICD-10-CM | POA: Diagnosis not present

## 2015-03-20 LAB — POCT GLYCOSYLATED HEMOGLOBIN (HGB A1C): HEMOGLOBIN A1C: 6.3

## 2015-03-20 MED ORDER — LEVOTHYROXINE SODIUM 75 MCG PO TABS: 75.0000 ug | ORAL_TABLET | Freq: Every day | ORAL | Status: DC

## 2015-03-20 MED ORDER — AMLODIPINE BESY-BENAZEPRIL HCL 5-40 MG PO CAPS: 1.0000 | ORAL_CAPSULE | Freq: Every day | ORAL | Status: DC

## 2015-03-20 MED ORDER — SPIRONOLACTONE 25 MG PO TABS: 12.5000 mg | ORAL_TABLET | Freq: Every day | ORAL | Status: DC

## 2015-03-20 MED ORDER — BYDUREON 2 MG ~~LOC~~ PEN: 2.0000 mg | PEN_INJECTOR | SUBCUTANEOUS | Status: DC

## 2015-03-20 MED ORDER — ATORVASTATIN CALCIUM 40 MG PO TABS: 40.0000 mg | ORAL_TABLET | Freq: Every day | ORAL | Status: DC

## 2015-03-20 NOTE — Progress Notes (Signed)
Name: Tabitha Shaw   MRN: [id number]    DOB: September 23, 1960   Date:03/20/2015       Progress Note  Subjective  Chief Complaint  Chief Complaint  Patient presents with  . Medication Refill    3 month F/U  . Diabetes    Checking blood sugar twice daily, Low-100 Average-120 High-150  . Hypertension    Patient went to [facility] to see Dr. Pryor Ochoa for dizziness, they performed some test, and went pt. turns her head left she got dizzy.   Marland Kitchen Hyperlipidemia  . Allergic Rhinitis     Well controlled    HPI   DMII : using Bydureon and tolerating well, glucose has been 100-150's - average 120's, no hypoglycemic episodes. She has been trying to be compliant with a diabetic diet, cutting down on soda intake, down to once or twice weekly a 12 ounce drink, drinking more water and exercising 40 minutes daily - walking at work during her breaks.  Denies polyphagia, polydipsia or polyuria.   HTN: taking medication daily and denies side effects, she has not been checking her bp at home, no chest pain or palpitation, no dizziness. BP has been towards low end of normal here and we will adjust dose of Spironolactone to half pill daily and monitor, if remains below 120/80 she can stop Spironolactone   Hyperlipidemia: taking Atorvastatin, no muscle aches, compliant   Hypothyroidism: she is taking Synthroid daily , denies constipation she has dry skin. Last TSH at goal back in November 2016  Extreme Obesity: lsot 5 lbs since last visit, exercising more and she is drinking more water - at least 32 ounces daily.  Hematochromatosis: sees hematologist every three months.   Vertigo: seen by Dr. Pryor Ochoa and audiologist, and currently no symptoms of vertigo  Patient Active Problem List   Diagnosis Date Noted  . Benign hypertension 09/12/2014  . Acanthosis nigricans 09/09/2014  . Diabetes mellitus with renal manifestation ([place]) 09/09/2014  . Dyslipidemia 09/09/2014  . Abnormal  electrocardiogram 09/09/2014  . Enlarged kidney 09/09/2014  . 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[place]) 09/09/2014  . Enlarged liver 09/09/2014  . Calcium blood increased 09/09/2014  . Adult hypothyroidism 09/09/2014  . Microalbuminuria 09/09/2014  . Extreme obesity ([place]) 09/09/2014  . Spinal stenosis of lumbar region with neurogenic claudication 09/09/2014  . Osteopenia 09/09/2014  . Vitamin D deficiency 09/09/2014    Past Surgical History  Procedure Laterality Date  . Breast reduction surgery    . Cesarean section    . Lapcholecystectomy    . Vesicovaginal fistula closure w/ tah    . Blood transfusion yearly    . Cesarean section  1992  . Cholecystectomy  2000  . Abdominal hysterectomy  2005    Family History  Problem Relation Age of Onset  . Colon polyps Mother   . Hemachromatosis Mother   . Hypertension Mother   . Colon polyps Brother   . Depression Sister   . Hypertension Brother   . Cancer Father     Social History   Social History  . Marital Status: Married    Spouse Name: N/A  . Number of Children: N/A  . Years of Education: N/A   Occupational History  . Not on file.   Social History Main Topics  . Smoking status: Former Smoker    Types: Cigarettes    Quit date: 01/19/1990  . Smokeless tobacco: Not on file  . Alcohol Use: 0.0 oz/week    0 Standard  drinks or equivalent per week  . Drug Use: No  . Sexual Activity: Yes   Other Topics Concern  . Not on file   Social History Narrative   ** Merged History Encounter **       Married; full time; does not get regular exercise.      Current outpatient prescriptions:  .  amLODipine-benazepril (LOTREL) 5-40 MG capsule, Take 1 capsule by mouth daily., Disp: 90 capsule, Rfl: 1 .  aspirin 81 MG EC tablet, Take 81 mg by mouth daily.  , Disp: , Rfl:  .  atorvastatin (LIPITOR) 40 MG tablet, Take 1 tablet (40 mg total) by mouth daily., Disp: 90 tablet, Rfl: 1 .  BYDUREON 2 MG PEN, Inject 2 mg into the skin once a  week., Disp: 12 each, Rfl: 1 .  ergocalciferol (VITAMIN D2) 50000 UNITS capsule, Take 1 capsule (50,000 Units total) by mouth once a week., Disp: 12 capsule, Rfl: 0 .  fluticasone (FLONASE) 50 MCG/ACT nasal spray, Place 2 sprays into both nostrils daily., Disp: 16 g, Rfl: 0 .  levothyroxine (SYNTHROID, LEVOTHROID) 75 MCG tablet, Take 1 tablet (75 mcg total) by mouth daily., Disp: 90 tablet, Rfl: 1 .  ONE TOUCH ULTRA TEST test strip, , Disp: , Rfl:  .  spironolactone (ALDACTONE) 25 MG tablet, Take 0.5 tablets (12.5 mg total) by mouth daily., Disp: 90 tablet, Rfl: 0  No Known Allergies   ROS  Constitutional: Negative for fever, positive for  weight change.  Respiratory: Negative for cough and shortness of breath.   Cardiovascular: Negative for chest pain or palpitations.  Gastrointestinal: Negative for abdominal pain, no bowel changes.  Musculoskeletal: Negative for gait problem or joint swelling.  Skin: Negative for rash.  Neurological: Negative for dizziness or headache.  No other specific complaints in a complete review of systems (except as listed in HPI above).   Objective  Filed Vitals:   03/20/15 0856  BP: 118/76  Pulse: 80  Temp: 98.1 F (36.7 C)  TempSrc: Oral  Resp: 18  Height: 5\' 8"  (1.727 m)  Weight: 257 lb 9.6 oz (116.847 kg)  SpO2: 99%    Body mass index is 39.18 kg/(m^2).  Physical Exam  Constitutional: Patient appears well-developed and well-nourished. Obese  No distress.  HEENT: head atraumatic, normocephalic, pupils equal and reactive to light,  neck supple, throat within normal limits Cardiovascular: Normal rate, regular rhythm and normal heart sounds.  No murmur heard. No BLE edema. Pulmonary/Chest: Effort normal and breath sounds normal. No respiratory distress. Abdominal: Soft.  There is no tenderness. Psychiatric: Patient has a normal mood and affect. behavior is normal. Judgment and thought content normal.  Recent Results (from the past 2160  hour(s))  POCT HgB A1C     Status: Normal   Collection Time: 12/20/14  4:35 PM  Result Value Ref Range   Hemoglobin A1C 5.4   POCT UA - Microalbumin     Status: Abnormal   Collection Time: 12/20/14  4:35 PM  Result Value Ref Range   Microalbumin Ur, POC 20 mg/L   Creatinine, POC  mg/dL   Albumin/Creatinine Ratio, Urine, POC    POCT HgB A1C     Status: None   Collection Time: 03/20/15  9:01 AM  Result Value Ref Range   Hemoglobin A1C 6.3     PHQ2/9: Depression screen [facility] 2/9 02/01/2015 09/12/2014  Decreased Interest 0 0  Down, Depressed, Hopeless 0 0  PHQ - 2 Score 0 0  Fall Risk: Fall Risk  02/01/2015 09/12/2014  Falls in the past year? No No    Assessment & Plan  1. Controlled type 2 diabetes mellitus with diabetic nephropathy, without long-term current use of insulin (HCC)  - POCT HgB A1C - BYDUREON 2 MG PEN; Inject 2 mg into the skin once a week.  Dispense: 12 each; Refill: 1  2. Morbid obesity due to excess calories [facility])  Discussed with the patient the risk posed by an increased BMI. Discussed importance of portion control, calorie counting and at least 150 minutes of physical activity weekly. Avoid sweet beverages and drink more water. Eat at least 6 servings of fruit and vegetables daily   3. Benign hypertension  Decreased dose to half of spironolactone and if remains low stop medication  - amLODipine-benazepril (LOTREL) 5-40 MG capsule; Take 1 capsule by mouth daily.  Dispense: 90 capsule; Refill: 1 - spironolactone (ALDACTONE) 25 MG tablet; Take 0.5 tablets (12.5 mg total) by mouth daily.  Dispense: 90 tablet; Refill: 0  4. Dyslipidemia  - atorvastatin (LIPITOR) 40 MG tablet; Take 1 tablet (40 mg total) by mouth daily.  Dispense: 90 tablet; Refill: 1  5. Vertigo  Symptoms resolved, has follow up with ENT soon  7. Breast cancer screening  - MM Digital Screening; Future  6. Other specified hypothyroidism  - levothyroxine (SYNTHROID, LEVOTHROID) 75  MCG tablet; Take 1 tablet (75 mcg total) by mouth daily.  Dispense: 90 tablet; Refill: 1

## 2015-04-18 ENCOUNTER — Encounter: Payer: Self-pay | Admitting: Family Medicine

## 2015-04-18 ENCOUNTER — Ambulatory Visit (INDEPENDENT_AMBULATORY_CARE_PROVIDER_SITE_OTHER): Payer: 59 | Admitting: Family Medicine

## 2015-04-18 VITALS — BP 122/60 | HR 98 | Temp 98.7°F | Resp 16 | Wt 259.8 lb

## 2015-04-18 DIAGNOSIS — Z0000 Encounter for general adult medical examination without abnormal findings: Secondary | ICD-10-CM | POA: Diagnosis not present

## 2015-04-18 DIAGNOSIS — Z23 Encounter for immunization: Secondary | ICD-10-CM

## 2015-04-18 DIAGNOSIS — Z1239 Encounter for other screening for malignant neoplasm of breast: Secondary | ICD-10-CM

## 2015-04-18 DIAGNOSIS — Z01419 Encounter for gynecological examination (general) (routine) without abnormal findings: Secondary | ICD-10-CM

## 2015-04-18 DIAGNOSIS — Z124 Encounter for screening for malignant neoplasm of cervix: Secondary | ICD-10-CM

## 2015-04-18 NOTE — Progress Notes (Signed)
Name: Tabitha Shaw   MRN: [id number]    DOB: [birth year]   Date:04/18/2015       Progress Note  Subjective  Chief Complaint  Chief Complaint  Patient presents with  . Annual Exam    HPI  Well Woman : she is not sexually active at this time ( her husband has chronic neck pain and medications is causing ED ). She denies any breast problems or bladder problems.   Patient Active Problem List   Diagnosis Date Noted  . Benign hypertension 09/12/2014  . Acanthosis nigricans 09/09/2014  . Diabetes mellitus with renal manifestation ([place]) 09/09/2014  . Dyslipidemia 09/09/2014  . Abnormal electrocardiogram 09/09/2014  . Enlarged kidney 09/09/2014  . 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[place]) 09/09/2014  . Enlarged liver 09/09/2014  . Calcium blood increased 09/09/2014  . Adult hypothyroidism 09/09/2014  . Microalbuminuria 09/09/2014  . Extreme obesity ([place]) 09/09/2014  . Spinal stenosis of lumbar region with neurogenic claudication 09/09/2014  . Osteopenia 09/09/2014  . Vitamin D deficiency 09/09/2014    Past Surgical History  Procedure Laterality Date  . Breast reduction surgery    . Cesarean section    . Lapcholecystectomy    . Vesicovaginal fistula closure w/ tah    . Blood transfusion yearly    . Cesarean section  1992  . Cholecystectomy  2000  . Abdominal hysterectomy  2005    Family History  Problem Relation Age of Onset  . Colon polyps Mother   . Hemachromatosis Mother   . Hypertension Mother   . Colon polyps Brother   . Depression Sister   . Hypertension Brother   . Cancer Father     Social History   Social History  . Marital Status: Married    Spouse Name: N/A  . Number of Children: N/A  . Years of Education: N/A   Occupational History  . Not on file.   Social History Main Topics  . Smoking status: Former Smoker    Types: Cigarettes    Quit date: 01/19/1990  . Smokeless tobacco: Not on file  . Alcohol Use: 0.0 oz/week    0 Standard drinks or equivalent  per week  . Drug Use: No  . Sexual Activity: Yes   Other Topics Concern  . Not on file   Social History Narrative   ** Merged History Encounter **       Married; full time; does not get regular exercise.      Current outpatient prescriptions:  .  amLODipine-benazepril (LOTREL) 5-40 MG capsule, Take 1 capsule by mouth daily., Disp: 90 capsule, Rfl: 1 .  aspirin 81 MG EC tablet, Take 81 mg by mouth daily.  , Disp: , Rfl:  .  atorvastatin (LIPITOR) 40 MG tablet, Take 1 tablet (40 mg total) by mouth daily., Disp: 90 tablet, Rfl: 1 .  BYDUREON 2 MG PEN, Inject 2 mg into the skin once a week., Disp: 12 each, Rfl: 1 .  ergocalciferol (VITAMIN D2) 50000 UNITS capsule, Take 1 capsule (50,000 Units total) by mouth once a week., Disp: 12 capsule, Rfl: 0 .  fluticasone (FLONASE) 50 MCG/ACT nasal spray, Place 2 sprays into both nostrils daily., Disp: 16 g, Rfl: 0 .  levothyroxine (SYNTHROID, LEVOTHROID) 75 MCG tablet, Take 1 tablet (75 mcg total) by mouth daily., Disp: 90 tablet, Rfl: 1 .  ONE TOUCH ULTRA TEST test strip, , Disp: , Rfl:  .  spironolactone (ALDACTONE) 25 MG tablet, Take 0.5 tablets (12.5 mg total) by mouth daily.,  Disp: 90 tablet, Rfl: 0  No Known Allergies   ROS  Constitutional: Negative for fever or weight change.  Respiratory: Negative for cough and shortness of breath.   Cardiovascular: Negative for chest pain or palpitations.  Gastrointestinal: Negative for abdominal pain, no bowel changes.  Musculoskeletal: Negative for gait problem or joint swelling.  Skin: Negative for rash.  Neurological: Negative for dizziness or headache.  No other specific complaints in a complete review of systems (except as listed in HPI above).  Objective  Filed Vitals:   04/18/15 1407  BP: 122/60  Pulse: 98  Temp: 98.7 F (37.1 C)  TempSrc: Oral  Resp: 16  Weight: 259 lb 12.8 oz (117.845 kg)  SpO2: 96%    Body mass index is 39.51 kg/(m^2).  Physical Exam  Constitutional:  Patient appears well-developed and well-nourished. No distress.  HENT: Head: Normocephalic and atraumatic. Ears: B TMs ok, no erythema or effusion; Nose: Nose normal. Mouth/Throat: Oropharynx is clear and moist. No oropharyngeal exudate.  Eyes: Conjunctivae and EOM are normal. Pupils are equal, round, and reactive to light. No scleral icterus.  Neck: Normal range of motion. Neck supple. No JVD present. No thyromegaly present.  Cardiovascular: Normal rate, regular rhythm and normal heart sounds.  No murmur heard. No BLE edema. Pulmonary/Chest: Effort normal and breath sounds normal. No respiratory distress. Abdominal: Soft. Bowel sounds are normal, no distension. There is no tenderness. no masses Breast: no lumps or masses, no nipple discharge or rashes FEMALE GENITALIA:  External genitalia normal External urethra normal Pelvic not done RECTAL: not done Musculoskeletal: Normal range of motion, no joint effusions. No gross deformities Neurological: he is alert and oriented to person, place, and time. No cranial nerve deficit. Coordination, balance, strength, speech and gait are normal.  Skin: Skin is warm and dry. No rash noted. No erythema.  Psychiatric: Patient has a normal mood and affect. behavior is normal. Judgment and thought content normal.  Recent Results (from the past 2160 hour(s))  POCT HgB A1C     Status: None   Collection Time: 03/20/15  9:01 AM  Result Value Ref Range   Hemoglobin A1C 6.3     PHQ2/9: Depression screen [facility] 2/9 04/18/2015 02/01/2015 09/12/2014  Decreased Interest 0 0 0  Down, Depressed, Hopeless 0 0 0  PHQ - 2 Score 0 0 0    Fall Risk: Fall Risk  04/18/2015 02/01/2015 09/12/2014  Falls in the past year? No No No    Functional Status Survey: Is the patient deaf or have difficulty hearing?: No Does the patient have difficulty seeing, even when wearing glasses/contacts?: No Does the patient have difficulty concentrating, remembering, or making decisions?:  No Does the patient have difficulty walking or climbing stairs?: No Does the patient have difficulty dressing or bathing?: No Does the patient have difficulty doing errands alone such as visiting a doctor's office or shopping?: No    Assessment & Plan  1. Well woman exam  Discussed importance of 150 minutes of physical activity weekly, eat two servings of fish weekly, eat one serving of tree nuts ( cashews, pistachios, pecans, almonds.Marland Kitchen) every other day, eat 6 servings of fruit/vegetables daily and drink plenty of water and avoid sweet beverages.   2. Cervical cancer screening  Recheck in 2019  3. Breast cancer screening  scheduling her mammogram now  4. Need for pneumococcal vaccination  - Pneumococcal conjugate vaccine 13-valent IM

## 2015-04-30 ENCOUNTER — Ambulatory Visit
Admission: RE | Admit: 2015-04-30 | Discharge: 2015-04-30 | Disposition: A | Discharge status: Home / Self Care | Payer: 59 | Source: Ambulatory Visit | Attending: Family Medicine | Admitting: Family Medicine

## 2015-04-30 DIAGNOSIS — Z1239 Encounter for other screening for malignant neoplasm of breast: Secondary | ICD-10-CM

## 2015-04-30 DIAGNOSIS — Z1231 Encounter for screening mammogram for malignant neoplasm of breast: Secondary | ICD-10-CM | POA: Insufficient documentation

## 2015-04-30 IMAGING — MG MM DIGITAL SCREENING BILAT W/ CAD
6 series · 6 of 6 positions shown · non-contrast
Comparison: Previous exam(s).

ACR Breast Density Category a: The breast tissue is almost entirely
fatty.

CLINICAL DATA: Screening.

EXAM:
DIGITAL SCREENING BILATERAL MAMMOGRAM WITH CAD

[R MLO]
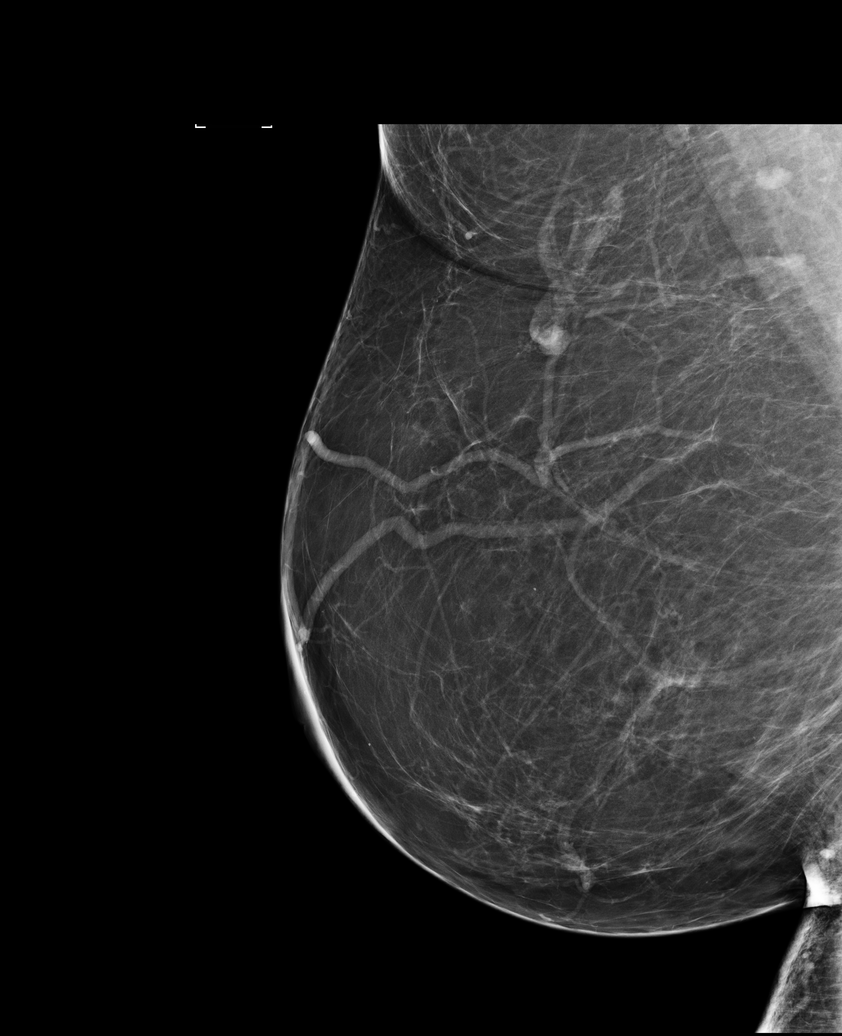

[L MLO (1 of 2)]
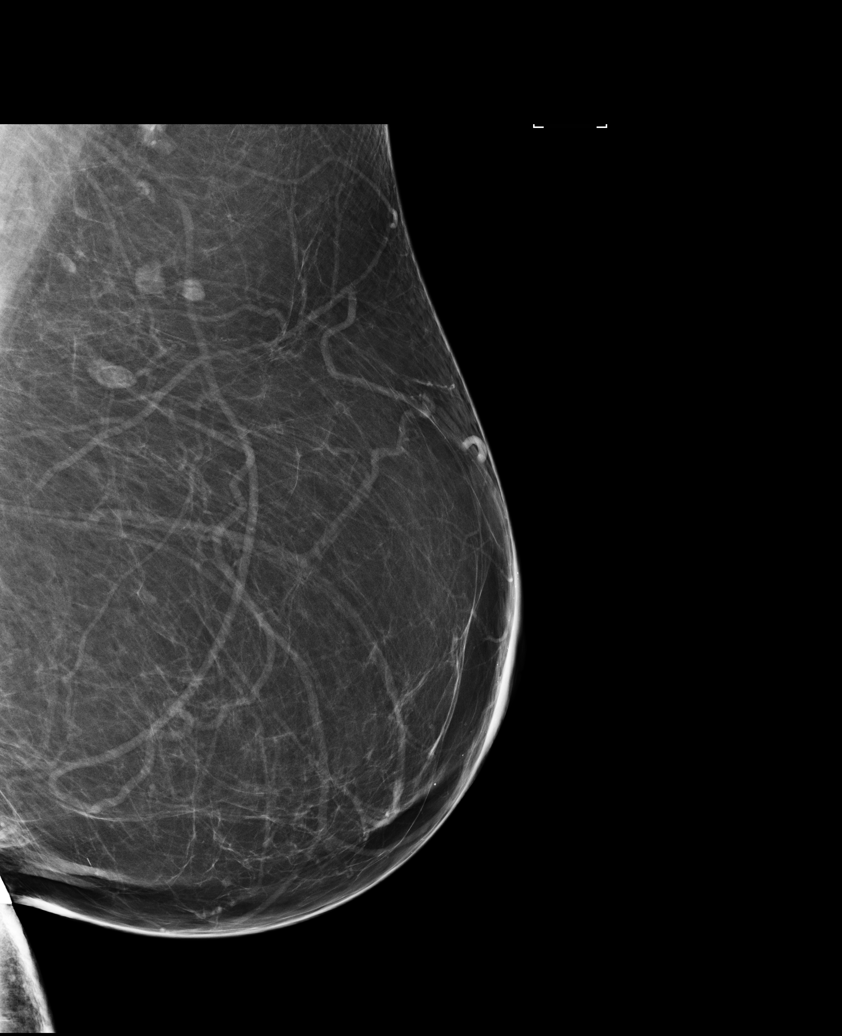

[R CV]
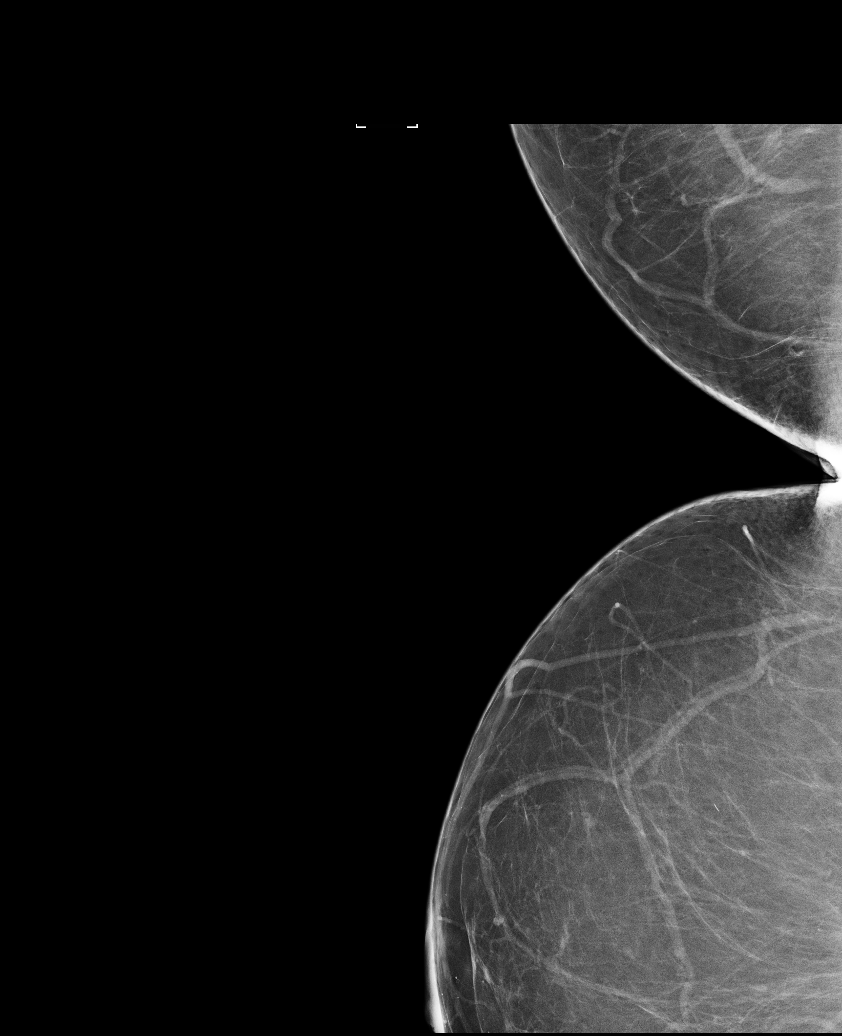

[R CC]
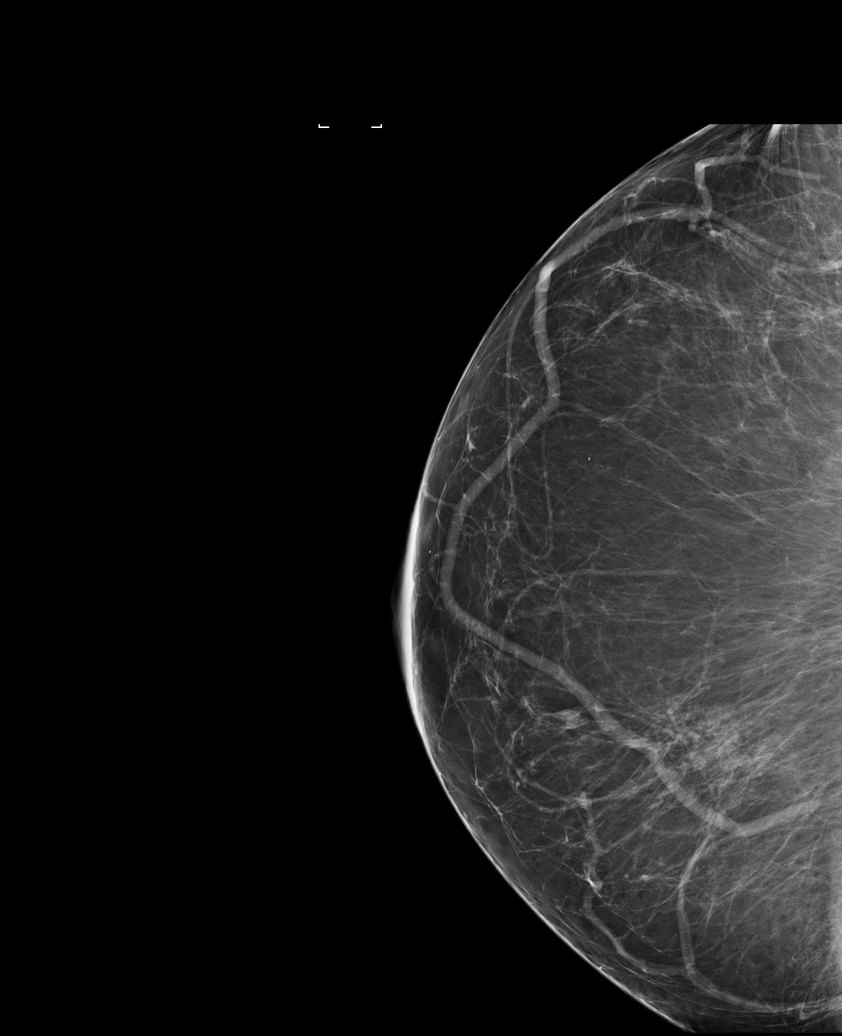

[L MLO (2 of 2)]
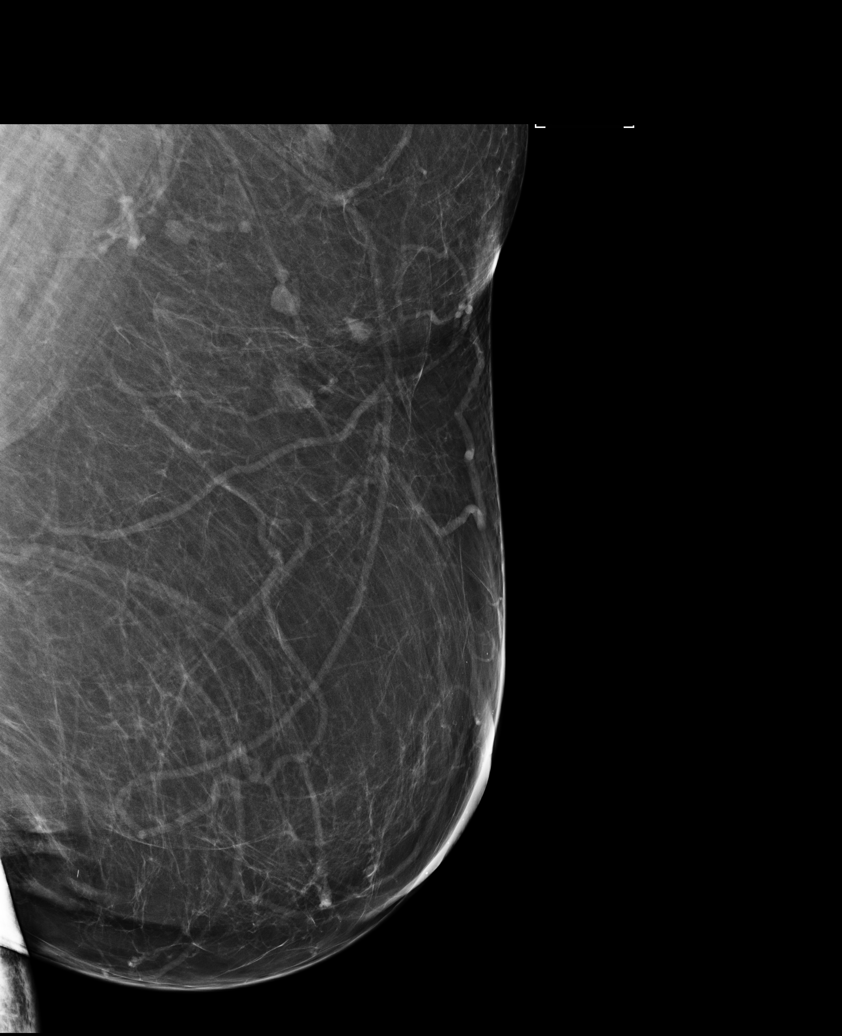

[L CC]
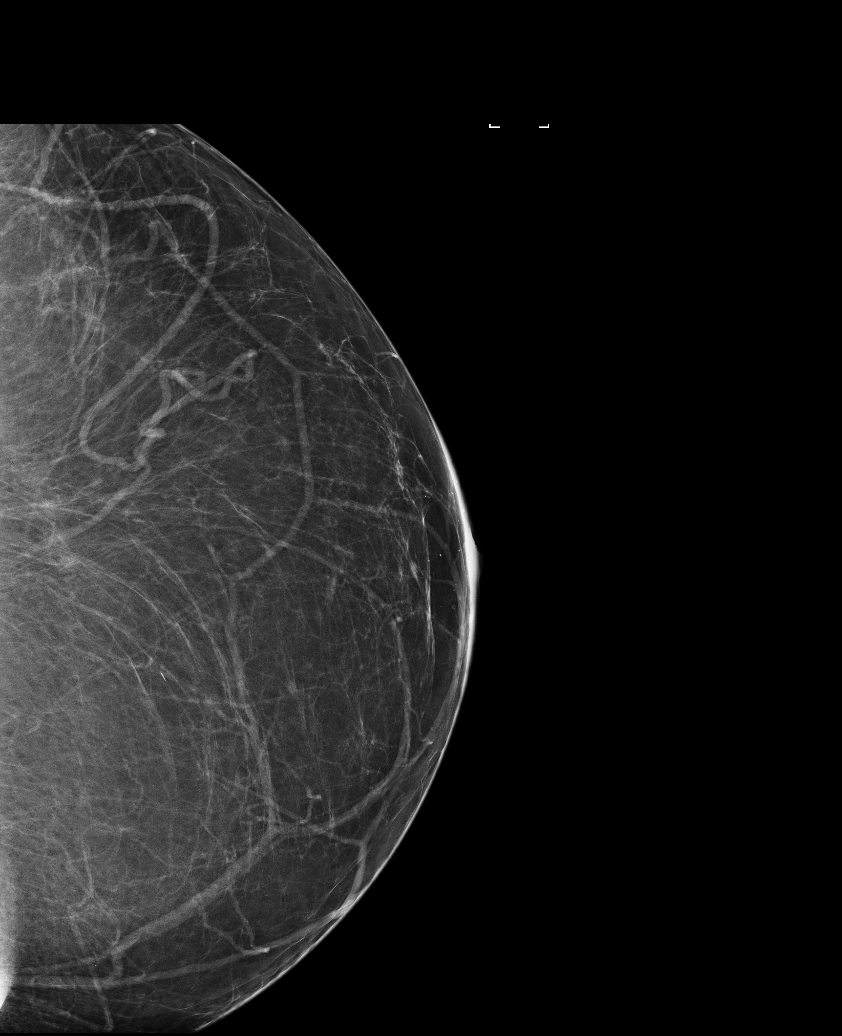

[6 of 6 positions shown; findings below may reference images not displayed]

FINDINGS: There are no findings suspicious for malignancy. Images were
processed with CAD.
IMPRESSION: No mammographic evidence of malignancy. A result letter of this
screening mammogram will be mailed directly to the patient.

RECOMMENDATION:
Screening mammogram in one year. (Code:[0V])

BI-RADS CATEGORY  1: Negative.

## 2015-05-07 ENCOUNTER — Other Ambulatory Visit: Payer: Self-pay

## 2015-05-07 DIAGNOSIS — I10 Essential (primary) hypertension: Secondary | ICD-10-CM

## 2015-05-07 MED ORDER — SPIRONOLACTONE 25 MG PO TABS: 12.5000 mg | ORAL_TABLET | Freq: Every day | ORAL | Status: DC

## 2015-05-07 NOTE — Telephone Encounter (Signed)
Got a fax from [facility] requesting a 90 days supply of this patient's Aldactone.  Refill request was sent to Dr. Steele Sizer for approval and submission.

## 2015-05-23 ENCOUNTER — Other Ambulatory Visit: Payer: Self-pay | Admitting: Family Medicine

## 2015-05-31 ENCOUNTER — Encounter: Payer: Self-pay | Admitting: Hematology and Oncology

## 2015-06-04 ENCOUNTER — Inpatient Hospital Stay: Payer: 59 | Attending: Oncology

## 2015-06-04 ENCOUNTER — Ambulatory Visit: Payer: 59 | Admitting: Oncology

## 2015-06-04 DIAGNOSIS — Z79899 Other long term (current) drug therapy: Secondary | ICD-10-CM | POA: Insufficient documentation

## 2015-06-24 ENCOUNTER — Encounter: Payer: Self-pay | Admitting: Family Medicine

## 2015-06-24 ENCOUNTER — Ambulatory Visit (INDEPENDENT_AMBULATORY_CARE_PROVIDER_SITE_OTHER): Payer: 59 | Admitting: Family Medicine

## 2015-06-24 VITALS — BP 122/76 | HR 88 | Temp 99.0°F | Resp 16 | Ht 68.0 in | Wt 258.6 lb

## 2015-06-24 DIAGNOSIS — E559 Vitamin D deficiency, unspecified: Secondary | ICD-10-CM

## 2015-06-24 DIAGNOSIS — I10 Essential (primary) hypertension: Secondary | ICD-10-CM

## 2015-06-24 DIAGNOSIS — E785 Hyperlipidemia, unspecified: Secondary | ICD-10-CM

## 2015-06-24 DIAGNOSIS — Z8639 Personal history of other endocrine, nutritional and metabolic disease: Secondary | ICD-10-CM

## 2015-06-24 DIAGNOSIS — R809 Proteinuria, unspecified: Secondary | ICD-10-CM | POA: Diagnosis not present

## 2015-06-24 DIAGNOSIS — Z794 Long term (current) use of insulin: Secondary | ICD-10-CM

## 2015-06-24 DIAGNOSIS — E1129 Type 2 diabetes mellitus with other diabetic kidney complication: Secondary | ICD-10-CM

## 2015-06-24 DIAGNOSIS — E038 Other specified hypothyroidism: Secondary | ICD-10-CM | POA: Diagnosis not present

## 2015-06-24 LAB — POCT UA - MICROALBUMIN: Microalbumin Ur, POC: 20 mg/L

## 2015-06-24 LAB — POCT GLYCOSYLATED HEMOGLOBIN (HGB A1C): HEMOGLOBIN A1C: 5.5

## 2015-06-24 NOTE — Progress Notes (Signed)
Name: Tabitha Shaw   MRN: [id number]    DOB: [birth year]   Date:06/24/2015       Progress Note  Subjective  Chief Complaint  Chief Complaint  Patient presents with  . Medication Refill    3 month F/U  . Diabetes    Checks BS 2x daily High-125 Low-100  . Hypothyroidism  . Hypertension  . Hyperlipidemia  . Obesity    HPI  DMII : using Bydureon and tolerating well, glucose has been 100-125  no hypoglycemic episodes, no side effects of medication and she does nto want to stop taking Bydureon because is helping with her weight also. She has been trying to be compliant with a diabetic diet, cutting down on soda intake, still drinking sodas  once or twice weekly a 12 ounce drink, drinking more water and exercising 40 minutes daily - walking at work during her breaks.  Denies polyphagia, polydipsia or polyuria. Taking benazepril for microalbuminuria  HTN: taking medication daily and denies side effects, she has not been checking her bp at home, no chest pain or palpitation, no dizziness. BP has been towards low end of normal here and we adjusted dose of Spironolactone on her last visit to half pill and since bp is still well controlled we will stop medication today  Hyperlipidemia: taking Atorvastatin, no muscle aches, compliant with medication.  Hypothyroidism: she is taking Synthroid daily , denies constipation she has dry skin. Last TSH at goal back in November 2016, we will recheck it today  Extreme Obesity: she lost 2 more lbs since last visit, exercising more and she is drinking more water - at least 32 ounces daily.  Hematochromatosis: sees hematologist every three months.    Patient Active Problem List   Diagnosis Date Noted  . Benign hypertension 09/12/2014  . Acanthosis nigricans 09/09/2014  . Diabetes mellitus with renal manifestation ([place]) 09/09/2014  . Dyslipidemia 09/09/2014  . Abnormal electrocardiogram 09/09/2014  . Enlarged kidney 09/09/2014  . Familial  hemochromatosis ([place]) 09/09/2014  . Enlarged liver 09/09/2014  . Calcium blood increased 09/09/2014  . Adult hypothyroidism 09/09/2014  . Microalbuminuria 09/09/2014  . Extreme obesity ([place]) 09/09/2014  . Spinal stenosis of lumbar region with neurogenic claudication 09/09/2014  . Osteopenia 09/09/2014  . Vitamin D deficiency 09/09/2014    Past Surgical History  Procedure Laterality Date  . Breast reduction surgery    . Cesarean section    . Lapcholecystectomy    . Vesicovaginal fistula closure w/ tah    . Blood transfusion yearly    . Cesarean section  1992  . Cholecystectomy  2000  . Abdominal hysterectomy  2005  . Reduction mammaplasty Bilateral 1985    Family History  Problem Relation Age of Onset  . Colon polyps Mother   . Hemachromatosis Mother   . Hypertension Mother   . Colon polyps Brother   . Depression Sister   . Hypertension Brother   . Cancer Father   . Breast cancer Cousin   . Breast cancer Cousin     Social History   Social History  . Marital Status: Married    Spouse Name: N/A  . Number of Children: N/A  . Years of Education: N/A   Occupational History  . Not on file.   Social History Main Topics  . Smoking status: Former Smoker    Types: Cigarettes    Quit date: 01/19/1990  . Smokeless tobacco: Not on file  . Alcohol Use: 0.0 oz/week    0  Standard drinks or equivalent per week  . Drug Use: No  . Sexual Activity: Yes   Other Topics Concern  . Not on file   Social History Narrative   ** Merged History Encounter **       Married; full time; does not get regular exercise.      Current outpatient prescriptions:  .  amLODipine-benazepril (LOTREL) 5-40 MG capsule, Take 1 capsule by mouth daily., Disp: 90 capsule, Rfl: 1 .  aspirin 81 MG EC tablet, Take 81 mg by mouth daily.  , Disp: , Rfl:  .  atorvastatin (LIPITOR) 40 MG tablet, Take 1 tablet (40 mg total) by mouth daily., Disp: 90 tablet, Rfl: 1 .  BYDUREON 2 MG PEN, INJECT 1 PEN  SUBCUTANEOUSLY WEEKLY FOR DIABETES (TAKE  IN PLACE OF KOMBYGLIZE), Disp: 12 each, Rfl: 1 .  ergocalciferol (VITAMIN D2) 50000 UNITS capsule, Take 1 capsule (50,000 Units total) by mouth once a week., Disp: 12 capsule, Rfl: 0 .  fluticasone (FLONASE) 50 MCG/ACT nasal spray, Place 2 sprays into both nostrils daily., Disp: 16 g, Rfl: 0 .  levothyroxine (SYNTHROID, LEVOTHROID) 75 MCG tablet, Take 1 tablet (75 mcg total) by mouth daily., Disp: 90 tablet, Rfl: 1 .  ONE TOUCH ULTRA TEST test strip, , Disp: , Rfl:   No Known Allergies   ROS  Ten systems reviewed and is negative except as mentioned in HPI   Objective  Filed Vitals:   06/24/15 1543 06/24/15 1605  BP: 122/76   Pulse: 101 88  Temp: 99 F (37.2 C)   TempSrc: Oral   Resp: 16   Height: 5\' 8"  (1.727 m)   Weight: 258 lb 9.6 oz (117.3 kg)   SpO2: 95%     Body mass index is 39.33 kg/(m^2).  Physical Exam  Constitutional: Patient appears well-developed and well-nourished. Obese  No distress.  HEENT: head atraumatic, normocephalic, pupils equal and reactive to light, neck supple, throat within normal limits Cardiovascular: Normal rate, regular rhythm and normal heart sounds.  No murmur heard. No BLE edema. Pulmonary/Chest: Effort normal and breath sounds normal. No respiratory distress. Abdominal: Soft.  There is no tenderness. Psychiatric: Patient has a normal mood and affect. behavior is normal. Judgment and thought content normal.  Recent Results (from the past 2160 hour(s))  POCT UA - Microalbumin     Status: None   Collection Time: 06/24/15  3:44 PM  Result Value Ref Range   Microalbumin Ur, POC 20 mg/L   Creatinine, POC  mg/dL   Albumin/Creatinine Ratio, Urine, POC    POCT HgB A1C     Status: None   Collection Time: 06/24/15  3:49 PM  Result Value Ref Range   Hemoglobin A1C 5.5       PHQ2/9: Depression screen [facility] 2/9 06/24/2015 04/18/2015 02/01/2015 09/12/2014  Decreased Interest 0 0 0 0  Down, Depressed,  Hopeless 0 0 0 0  PHQ - 2 Score 0 0 0 0     Fall Risk: Fall Risk  06/24/2015 04/18/2015 02/01/2015 09/12/2014  Falls in the past year? No No No No      Functional Status Survey: Is the patient deaf or have difficulty hearing?: No Does the patient have difficulty seeing, even when wearing glasses/contacts?: No Does the patient have difficulty concentrating, remembering, or making decisions?: No Does the patient have difficulty walking or climbing stairs?: No Does the patient have difficulty dressing or bathing?: No Does the patient have difficulty doing errands alone such as visiting a  doctor's office or shopping?: No    Assessment & Plan  1. Type 2 diabetes mellitus with microalbuminuria, with long-term current use of insulin (HCC)  Doing well, she wants to continue on medication, no hypoglycemic episodes - POCT HgB A1C - POCT UA - Microalbumin  2. Dyslipidemia  - Lipid panel  3. Benign hypertension  - CBC with Differential/Platelet - Comprehensive metabolic panel - Nicotine/cotinine metabolites  4. Morbid obesity due to excess calories [facility])  Discussed with the patient the risk posed by an increased BMI. Discussed importance of portion control, calorie counting and at least 150 minutes of physical activity weekly. Avoid sweet beverages and drink more water. Eat at least 6 servings of fruit and vegetables daily   5. 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[place])  - CBC with Differential/Platelet  6. Calcium blood increased  - Parathyroid hormone, intact (no Ca) - Calcium, ionized  7. Other specified hypothyroidism  - TSH  8. History of non anemic vitamin B12 deficiency  - Vitamin B12  9. Vitamin D deficiency  - VITAMIN D 25 Hydroxy (Vit-D Deficiency, Fractures)

## 2015-08-29 ENCOUNTER — Encounter: Payer: Self-pay | Admitting: Oncology

## 2015-09-02 ENCOUNTER — Encounter (INDEPENDENT_AMBULATORY_CARE_PROVIDER_SITE_OTHER): Payer: Self-pay

## 2015-09-02 ENCOUNTER — Inpatient Hospital Stay: Payer: 59

## 2015-09-02 ENCOUNTER — Inpatient Hospital Stay: Payer: 59 | Attending: Oncology | Admitting: Oncology

## 2015-09-02 DIAGNOSIS — Z87891 Personal history of nicotine dependence: Secondary | ICD-10-CM | POA: Insufficient documentation

## 2015-09-02 DIAGNOSIS — Z79899 Other long term (current) drug therapy: Secondary | ICD-10-CM | POA: Insufficient documentation

## 2015-09-02 DIAGNOSIS — E669 Obesity, unspecified: Secondary | ICD-10-CM | POA: Diagnosis not present

## 2015-09-02 DIAGNOSIS — E119 Type 2 diabetes mellitus without complications: Secondary | ICD-10-CM | POA: Diagnosis not present

## 2015-09-02 DIAGNOSIS — I10 Essential (primary) hypertension: Secondary | ICD-10-CM | POA: Insufficient documentation

## 2015-09-02 DIAGNOSIS — E559 Vitamin D deficiency, unspecified: Secondary | ICD-10-CM | POA: Diagnosis not present

## 2015-09-02 DIAGNOSIS — E785 Hyperlipidemia, unspecified: Secondary | ICD-10-CM | POA: Insufficient documentation

## 2015-09-02 NOTE — Progress Notes (Signed)
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  INTERVAL HISTORY: Patient returns to clinic today for repeat laboratory work and further evaluation.  She continues to feel well and is asymptomatic.  She has no neurologic complaints.  She denies any recent fevers or illnesses.  She denies any chest pain or shortness of breath.  She denies any weakness or fatigue.  She has a good appetite and denies weight loss. She denies any nausea, vomiting, constipation, or diarrhea. She has no urinary complaints. Patient offers no specific complaints today.  REVIEW OF SYSTEMS:   Review of Systems  Constitutional: Negative.  Negative for fever, malaise/fatigue and weight loss.  Respiratory: Negative.  Negative for cough and shortness of breath.   Cardiovascular: Negative.  Negative for chest pain.  Gastrointestinal: Negative.  Negative for abdominal pain, blood in stool and melena.  Musculoskeletal: Negative.   Neurological: Negative.  Negative for weakness.  Psychiatric/Behavioral: Negative.     As per HPI. Otherwise, a complete review of systems is negatve.  PAST MEDICAL HISTORY: Past Medical History:  Diagnosis Date  . Acquired acanthosis nigricans   . Diabetes type 2, controlled ([place])    if controlled was not specified  . Hemochromatosis   . Hepatomegaly   . HLD (hyperlipidemia)   . HTN (hypertension)   . Hyperlipidemia   . Hypertension   . Hypertrophy of kidney   .  Hypothyroidism   . Intertrigo   . Obesity   . Osteopenia   . Postinflammatory hyperpigmentation   . Snoring   . Thyroid disease   . Vitamin D deficiency     PAST SURGICAL HISTORY: Past Surgical History:  Procedure Laterality Date  . ABDOMINAL HYSTERECTOMY  2005  . blood transfusion yearly    . BREAST REDUCTION SURGERY    . CESAREAN SECTION    . CESAREAN SECTION  1992  . CHOLECYSTECTOMY  2000  . lapcholecystectomy    . REDUCTION MAMMAPLASTY Bilateral 1985  . VESICOVAGINAL FISTULA CLOSURE W/ TAH      FAMILY HISTORY Family History  Problem Relation Age of Onset  . Colon polyps Mother   . Hemachromatosis Mother   . Hypertension Mother   . Colon polyps Brother   . Depression Sister   . Hypertension Brother   . Cancer Father   . Breast cancer Cousin   . Breast cancer Cousin        ADVANCED DIRECTIVES:    HEALTH MAINTENANCE: Social History  Substance Use Topics  . Smoking status: Former Smoker    Types: Cigarettes    Quit date: 01/19/1990  . Smokeless tobacco: Not on file  . Alcohol use 0.0 oz/week     Colonoscopy:  PAP:  Bone density:  Lipid panel:  No Known Allergies  Current Outpatient Prescriptions  Medication Sig Dispense Refill  . amLODipine-benazepril (LOTREL) 5-40 MG capsule Take 1 capsule by mouth daily. 90 capsule 1  . aspirin 81 MG EC tablet Take 81 mg by mouth daily.      Marland Kitchen atorvastatin (LIPITOR) 40 MG tablet Take 1 tablet (40 mg  total) by mouth daily. 90 tablet 1  . BYDUREON 2 MG PEN INJECT 1 PEN SUBCUTANEOUSLY WEEKLY FOR DIABETES (TAKE  IN PLACE OF KOMBYGLIZE) 12 each 1  . ergocalciferol (VITAMIN D2) 50000 UNITS capsule Take 1 capsule (50,000 Units total) by mouth once a week. 12 capsule 0  . fluticasone (FLONASE) 50 MCG/ACT nasal spray Place 2 sprays into both nostrils daily. 16 g 0  . levothyroxine (SYNTHROID, LEVOTHROID) 75 MCG tablet Take 1 tablet (75 mcg total) by mouth daily. 90 tablet 1  . ONE TOUCH ULTRA TEST test strip      No  current facility-administered medications for this visit.     OBJECTIVE: Vitals:   09/02/15 1435  BP: 117/83  Pulse: 76  Resp: 18  Temp: 97.8 F (36.6 C)     Body mass index is 39.07 kg/m.    ECOG FS:0 - Asymptomatic  General: Well-developed, well-nourished, no acute distress. Eyes: anicteric sclera. Lungs: Clear to auscultation bilaterally. Heart: Regular rate and rhythm. No rubs, murmurs, or gallops. Abdomen: Soft, nontender, nondistended. No organomegaly noted, normoactive bowel sounds. Musculoskeletal: No edema, cyanosis, or clubbing. Neuro: Alert, answering all questions appropriately. Cranial nerves grossly intact. Skin: No rashes or petechiae noted. Psych: Normal affect.    LAB RESULTS:  Lab Results  Component Value Date   NA 141 09/12/2014   K 4.4 09/12/2014   CL 101 09/12/2014   CO2 24 09/12/2014   GLUCOSE 136 (H) 09/12/2014   BUN 6 09/12/2014   CREATININE 0.69 09/12/2014   CALCIUM 10.4 (H) 09/12/2014   PROT 7.0 09/12/2014   ALBUMIN 4.3 09/12/2014   AST 32 09/12/2014   ALT 38 (H) 09/12/2014   ALKPHOS 86 09/12/2014   BILITOT 0.9 09/12/2014   GFRNONAA 100 09/12/2014   GFRAA 116 09/12/2014    Lab Results  Component Value Date   WBC 7.7 09/12/2014   NEUTROABS 4.1 09/12/2014   HGB 16.1 (H) 03/08/2012   HCT 43.8 09/12/2014   MCV 103 (H) 09/12/2014   PLT 252 09/12/2014     STUDIES: No results found.  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  PLAN:    1.  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 The remainder of her laboratory work continues to be within normal limits.  Proceed with 400 mL phlebotomy today. It appears that she only requires phlebotomy every 3 months. Return to clinic in 3 months for phlebotomy and then in 6 months for phlebotomy and further evaluation.   Patient expressed understanding and was in agreement with this plan. She also understands that She can  call clinic at any time with any questions, concerns, or complaints.    Tabitha Huger, MD   09/03/2015 8:25 AM

## 2015-09-02 NOTE — Progress Notes (Signed)
States is feeling well. Offers no complaints. 

## 2015-09-04 ENCOUNTER — Encounter: Payer: Self-pay | Admitting: Family Medicine

## 2015-09-04 ENCOUNTER — Other Ambulatory Visit: Payer: Self-pay | Admitting: Family Medicine

## 2015-09-04 DIAGNOSIS — E538 Deficiency of other specified B group vitamins: Secondary | ICD-10-CM | POA: Insufficient documentation

## 2015-09-04 DIAGNOSIS — R7989 Other specified abnormal findings of blood chemistry: Secondary | ICD-10-CM | POA: Insufficient documentation

## 2015-09-04 MED ORDER — ERGOCALCIFEROL 1.25 MG (50000 UT) PO CAPS: 50000.0000 [IU] | ORAL_CAPSULE | ORAL | 0 refills | Status: DC

## 2015-09-06 ENCOUNTER — Encounter: Payer: Self-pay | Admitting: Family Medicine

## 2015-09-06 ENCOUNTER — Ambulatory Visit (INDEPENDENT_AMBULATORY_CARE_PROVIDER_SITE_OTHER): Payer: 59 | Admitting: Family Medicine

## 2015-09-06 DIAGNOSIS — E215 Disorder of parathyroid gland, unspecified: Secondary | ICD-10-CM

## 2015-09-06 DIAGNOSIS — E1129 Type 2 diabetes mellitus with other diabetic kidney complication: Secondary | ICD-10-CM | POA: Diagnosis not present

## 2015-09-06 DIAGNOSIS — Z794 Long term (current) use of insulin: Secondary | ICD-10-CM

## 2015-09-06 DIAGNOSIS — E538 Deficiency of other specified B group vitamins: Secondary | ICD-10-CM

## 2015-09-06 DIAGNOSIS — R7989 Other specified abnormal findings of blood chemistry: Secondary | ICD-10-CM

## 2015-09-06 DIAGNOSIS — R809 Proteinuria, unspecified: Secondary | ICD-10-CM

## 2015-09-06 LAB — COMPREHENSIVE METABOLIC PANEL
A/G RATIO: 1.6 (ref 1.2–2.2)
ALBUMIN: 4.4 g/dL (ref 3.5–5.5)
ALK PHOS: 97 IU/L (ref 39–117)
ALT: 53 IU/L — ABNORMAL HIGH (ref 0–32)
AST: 50 IU/L — ABNORMAL HIGH (ref 0–40)
BILIRUBIN TOTAL: 0.8 mg/dL (ref 0.0–1.2)
BUN / CREAT RATIO: 13 (ref 9–23)
BUN: 9 mg/dL (ref 6–24)
CHLORIDE: 101 mmol/L (ref 96–106)
CO2: 23 mmol/L (ref 18–29)
Calcium: 10.2 mg/dL (ref 8.7–10.2)
Creatinine, Ser: 0.67 mg/dL (ref 0.57–1.00)
GFR calc Af Amer: 116 mL/min/{1.73_m2} (ref >59)
GFR calc non Af Amer: 101 mL/min/{1.73_m2} (ref >59)
GLUCOSE: 131 mg/dL — AB (ref 65–99)
Globulin, Total: 2.8 g/dL (ref 1.5–4.5)
POTASSIUM: 4.4 mmol/L (ref 3.5–5.2)
Sodium: 141 mmol/L (ref 134–144)
Total Protein: 7.2 g/dL (ref 6.0–8.5)

## 2015-09-06 LAB — LIPID PANEL
CHOLESTEROL TOTAL: 178 mg/dL (ref 100–199)
Chol/HDL Ratio: 4.6 ratio units — ABNORMAL HIGH (ref 0.0–4.4)
HDL: 39 mg/dL — ABNORMAL LOW (ref >39)
LDL Calculated: 110 mg/dL — ABNORMAL HIGH (ref 0–99)
Triglycerides: 143 mg/dL (ref 0–149)
VLDL CHOLESTEROL CAL: 29 mg/dL (ref 5–40)

## 2015-09-06 LAB — VITAMIN D 25 HYDROXY (VIT D DEFICIENCY, FRACTURES): VIT D 25 HYDROXY: 19.7 ng/mL — AB (ref 30.0–100.0)

## 2015-09-06 LAB — CBC WITH DIFFERENTIAL/PLATELET
BASOS ABS: 0 10*3/uL (ref 0.0–0.2)
Basos: 1 %
EOS (ABSOLUTE): 0.1 10*3/uL (ref 0.0–0.4)
Eos: 1 %
Hematocrit: 43.2 % (ref 34.0–46.6)
Hemoglobin: 15 g/dL (ref 11.1–15.9)
Immature Grans (Abs): 0 10*3/uL (ref 0.0–0.1)
Immature Granulocytes: 0 %
LYMPHS ABS: 3.9 10*3/uL — AB (ref 0.7–3.1)
Lymphs: 48 %
MCH: 36.4 pg — AB (ref 26.6–33.0)
MCHC: 34.7 g/dL (ref 31.5–35.7)
MCV: 105 fL — AB (ref 79–97)
Monocytes Absolute: 0.5 10*3/uL (ref 0.1–0.9)
Monocytes: 6 %
NEUTROS ABS: 3.5 10*3/uL (ref 1.4–7.0)
Neutrophils: 44 %
PLATELETS: 245 10*3/uL (ref 150–379)
RBC: 4.12 x10E6/uL (ref 3.77–5.28)
RDW: 13 % (ref 12.3–15.4)
WBC: 8 10*3/uL (ref 3.4–10.8)

## 2015-09-06 LAB — TSH: TSH: 1.14 u[IU]/mL (ref 0.450–4.500)

## 2015-09-06 LAB — CALCIUM, IONIZED: CALCIUM ION: 5.5 mg/dL (ref 4.5–5.6)

## 2015-09-06 LAB — PARATHYROID HORMONE, INTACT (NO CA): PTH: 79 pg/mL — AB (ref 15–65)

## 2015-09-06 LAB — NICOTINE/COTININE METABOLITES
COTININE: 2.8 ng/mL
NICOTINE: NOT DETECTED ng/mL

## 2015-09-06 LAB — VITAMIN B12: VITAMIN B 12: 234 pg/mL (ref 211–946)

## 2015-09-06 MED ORDER — LIRAGLUTIDE -WEIGHT MANAGEMENT 18 MG/3ML ~~LOC~~ SOPN: 3.2000 mg | PEN_INJECTOR | Freq: Every day | SUBCUTANEOUS | 0 refills | Status: DC

## 2015-09-06 MED ORDER — CYANOCOBALAMIN 1000 MCG/ML IJ SOLN
1000.0000 ug | Freq: Once | INTRAMUSCULAR | Status: AC
  Administered 2015-09-06: 1000 ug via INTRAMUSCULAR

## 2015-09-06 NOTE — Patient Instructions (Signed)
STart at 1.8 for the first week and increase weekly to a max of 3.6  Stop [employer]

## 2015-09-06 NOTE — Progress Notes (Signed)
Name: Tabitha Shaw   MRN: [id number]    DOB: [birth year]   Date:09/06/2015       Progress Note  Subjective  Chief Complaint  Chief Complaint  Patient presents with  . Obesity    pt here for paperwork    HPI  DMII : using Bydureon and tolerating well, glucose has been 100-125  no hypoglycemic episodes, no side effects of medication and she does nto want to stop taking Bydureon because is helping with her weight also.  Denies polyphagia, polydipsia or polyuria. Taking benazepril for microalbuminuria  Extreme Obesity: Her weight has been stable, but needs to lose Still drinking sodas twice a week. Still active during her exercise breaks. She has been obese since the birth of her child 24 years ago. She is trying grilled meals when going out. She will try cutting down on portion size. Discussed switching from Bydureon to [place].   Patient Active Problem List   Diagnosis Date Noted  . B12 deficiency 09/04/2015  . Elevated PTHrP level ([place]) 09/04/2015  . Benign hypertension 09/12/2014  . Acanthosis nigricans 09/09/2014  . Diabetes mellitus with renal manifestation ([place]) 09/09/2014  . Dyslipidemia 09/09/2014  . Abnormal electrocardiogram 09/09/2014  . Enlarged kidney 09/09/2014  . 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[place]) 09/09/2014  . Enlarged liver 09/09/2014  . Calcium blood increased 09/09/2014  . Adult hypothyroidism 09/09/2014  . Microalbuminuria 09/09/2014  . Extreme obesity ([place]) 09/09/2014  . Spinal stenosis of lumbar region with neurogenic claudication 09/09/2014  . Osteopenia 09/09/2014  . Vitamin D deficiency 09/09/2014    Past Surgical History:  Procedure Laterality Date  . ABDOMINAL HYSTERECTOMY  2005  . blood transfusion yearly    . BREAST REDUCTION SURGERY    . CESAREAN SECTION    . CESAREAN SECTION  1992  . CHOLECYSTECTOMY  2000  . lapcholecystectomy    . REDUCTION MAMMAPLASTY Bilateral 1985  . VESICOVAGINAL FISTULA CLOSURE W/ TAH      Family History   Problem Relation Age of Onset  . Colon polyps Mother   . Hemachromatosis Mother   . Hypertension Mother   . Depression Sister   . Hypertension Brother   . Cancer Father   . Colon polyps Brother   . Breast cancer Cousin   . Breast cancer Cousin     Social History   Social History  . Marital status: Married    Spouse name: N/A  . Number of children: N/A  . Years of education: N/A   Occupational History  . Not on file.   Social History Main Topics  . Smoking status: Former Smoker    Types: Cigarettes    Quit date: 01/19/1990  . Smokeless tobacco: Never Used  . Alcohol use 0.0 oz/week  . Drug use: No  . Sexual activity: Yes   Other Topics Concern  . Not on file   Social History Narrative   ** Merged History Encounter **       Married; full time; does not get regular exercise.      Current Outpatient Prescriptions:  .  amLODipine-benazepril (LOTREL) 5-40 MG capsule, Take 1 capsule by mouth daily., Disp: 90 capsule, Rfl: 1 .  aspirin 81 MG EC tablet, Take 81 mg by mouth daily.  , Disp: , Rfl:  .  atorvastatin (LIPITOR) 40 MG tablet, Take 1 tablet (40 mg total) by mouth daily., Disp: 90 tablet, Rfl: 1 .  ergocalciferol (VITAMIN D2) 50000 units capsule, Take 1 capsule (50,000 Units total) by mouth once  a week., Disp: 12 capsule, Rfl: 0 .  fluticasone (FLONASE) 50 MCG/ACT nasal spray, Place 2 sprays into both nostrils daily., Disp: 16 g, Rfl: 0 .  levothyroxine (SYNTHROID, LEVOTHROID) 75 MCG tablet, Take 1 tablet (75 mcg total) by mouth daily., Disp: 90 tablet, Rfl: 1 .  Liraglutide -Weight Management (SAXENDA) 18 MG/3ML SOPN, Inject 3.2 mg into the skin daily., Disp: 3 pen, Rfl: 0 .  ONE TOUCH ULTRA TEST test strip, , Disp: , Rfl:   No Known Allergies   ROS  Constitutional: Negative for fever or weight change.  Respiratory: Negative for cough and shortness of breath.   Cardiovascular: Negative for chest pain or palpitations.  Gastrointestinal: Negative for  abdominal pain, no bowel changes.  Musculoskeletal: Negative for gait problem or joint swelling.  Skin: Negative for rash.  Neurological: Negative for dizziness or headache.  No other specific complaints in a complete review of systems (except as listed in HPI above).  Objective  Vitals:   09/06/15 1154  BP: 104/68  Pulse: 93  Resp: 18  Temp: 98.3 F (36.8 C)  SpO2: 94%  Weight: 258 lb 7 oz (117.2 kg)  Height: 5\' 8"  (1.727 m)    Body mass index is 39.3 kg/m.  Physical Exam  Constitutional: Patient appears well-developed and well-nourished. Obese  No distress.  HEENT: head atraumatic, normocephalic, pupils equal and reactive to light,  neck supple, throat within normal limits Cardiovascular: Normal rate, regular rhythm and normal heart sounds.  No murmur heard. No BLE edema. Pulmonary/Chest: Effort normal and breath sounds normal. No respiratory distress. Abdominal: Soft.  There is no tenderness. Psychiatric: Patient has a normal mood and affect. behavior is normal. Judgment and thought content normal.  Recent Results (from the past 2160 hour(s))  POCT UA - Microalbumin     Status: None   Collection Time: 06/24/15  3:44 PM  Result Value Ref Range   Microalbumin Ur, POC 20 mg/L   Creatinine, POC  mg/dL   Albumin/Creatinine Ratio, Urine, POC    POCT HgB A1C     Status: None   Collection Time: 06/24/15  3:49 PM  Result Value Ref Range   Hemoglobin A1C 5.5   CBC with Differential/Platelet     Status: Abnormal   Collection Time: 09/03/15 10:51 AM  Result Value Ref Range   WBC 8.0 3.4 - 10.8 x10E3/uL   RBC 4.12 3.77 - 5.28 x10E6/uL   Hemoglobin 15.0 11.1 - 15.9 g/dL   Hematocrit 43.2 34.0 - 46.6 %   MCV 105 (H) 79 - 97 fL   MCH 36.4 (H) 26.6 - 33.0 pg   MCHC 34.7 31.5 - 35.7 g/dL   RDW 13.0 12.3 - 15.4 %   Platelets 245 150 - 379 x10E3/uL   Neutrophils 44 %   Lymphs 48 %   Monocytes 6 %   Eos 1 %   Basos 1 %   Neutrophils Absolute 3.5 1.4 - 7.0 x10E3/uL    Lymphocytes Absolute 3.9 (H) 0.7 - 3.1 x10E3/uL   Monocytes Absolute 0.5 0.1 - 0.9 x10E3/uL   EOS (ABSOLUTE) 0.1 0.0 - 0.4 x10E3/uL   Basophils Absolute 0.0 0.0 - 0.2 x10E3/uL   Immature Granulocytes 0 %   Immature Grans (Abs) 0.0 0.0 - 0.1 x10E3/uL  Comprehensive metabolic panel     Status: Abnormal   Collection Time: 09/03/15 10:51 AM  Result Value Ref Range   Glucose 131 (H) 65 - 99 mg/dL   BUN 9 6 - 24 mg/dL  Creatinine, Ser 0.67 0.57 - 1.00 mg/dL   GFR calc non Af Amer 101 >59 mL/min/1.73   GFR calc Af Amer 116 >59 mL/min/1.73   BUN/Creatinine Ratio 13 9 - 23   Sodium 141 134 - 144 mmol/L   Potassium 4.4 3.5 - 5.2 mmol/L   Chloride 101 96 - 106 mmol/L   CO2 23 18 - 29 mmol/L   Calcium 10.2 8.7 - 10.2 mg/dL   Total Protein 7.2 6.0 - 8.5 g/dL   Albumin 4.4 3.5 - 5.5 g/dL   Globulin, Total 2.8 1.5 - 4.5 g/dL   Albumin/Globulin Ratio 1.6 1.2 - 2.2   Bilirubin Total 0.8 0.0 - 1.2 mg/dL   Alkaline Phosphatase 97 39 - 117 IU/L   AST 50 (H) 0 - 40 IU/L   ALT 53 (H) 0 - 32 IU/L  Lipid panel     Status: Abnormal   Collection Time: 09/03/15 10:51 AM  Result Value Ref Range   Cholesterol, Total 178 100 - 199 mg/dL   Triglycerides 143 0 - 149 mg/dL   HDL 39 (L) >39 mg/dL   VLDL Cholesterol Cal 29 5 - 40 mg/dL   LDL Calculated 110 (H) 0 - 99 mg/dL   Chol/HDL Ratio 4.6 (H) 0.0 - 4.4 ratio units    Comment:                                   T. Chol/HDL Ratio                                             Men  Women                               1/2 Avg.Risk  3.4    3.3                                   Avg.Risk  5.0    4.4                                2X Avg.Risk  9.6    7.1                                3X Avg.Risk 23.4   11.0   TSH     Status: None   Collection Time: 09/03/15 10:51 AM  Result Value Ref Range   TSH 1.140 0.450 - 4.500 uIU/mL  Vitamin B12     Status: None   Collection Time: 09/03/15 10:51 AM  Result Value Ref Range   Vitamin B-12 234 211 - 946 pg/mL  VITAMIN  D 25 Hydroxy (Vit-D Deficiency, Fractures)     Status: Abnormal   Collection Time: 09/03/15 10:51 AM  Result Value Ref Range   Vit D, 25-Hydroxy 19.7 (L) 30.0 - 100.0 ng/mL    Comment: Vitamin D deficiency has been defined by the [place] and an Endocrine Society practice guideline as a level of serum 25-OH vitamin D less than 20 ng/mL (1,2). The Endocrine Society went on to  further define vitamin D insufficiency as a level between 21 and 29 ng/mL (2). 1. IOM (Institute of Medicine). 2010. Dietary reference    intakes for calcium and D. [place]: The    [employer]. 2. Holick MF, Binkley Ayr, Bischoff-Ferrari HA, et al.    Evaluation, treatment, and prevention of vitamin D    deficiency: an Endocrine Society clinical practice    guideline. JCEM. 2011 Jul; 96(7):1911-30.   Parathyroid hormone, intact (no Ca)     Status: Abnormal   Collection Time: 09/03/15 10:51 AM  Result Value Ref Range   PTH 79 (H) 15 - 65 pg/mL  Calcium, ionized     Status: None   Collection Time: 09/03/15 10:51 AM  Result Value Ref Range   Calcium, Ion 5.5 4.5 - 5.6 mg/dL  Nicotine/cotinine metabolites     Status: None   Collection Time: 09/03/15 10:51 AM  Result Value Ref Range   Nicotine None Detected ng/mL    Comment: Nicotine levels greater than 2.0 are consistent with the use of tobacco or tobacco cessation products.    Cotinine 2.8 ng/mL    Comment: Cotinine levels greater than 20.0 are consistent with the use of tobacco or tobacco cessation products.      PHQ2/9: Depression screen [facility] 2/9 09/06/2015 06/24/2015 04/18/2015 02/01/2015 09/12/2014  Decreased Interest 0 0 0 0 0  Down, Depressed, Hopeless 0 0 0 0 0  PHQ - 2 Score 0 0 0 0 0    Fall Risk: Fall Risk  09/06/2015 06/24/2015 04/18/2015 02/01/2015 09/12/2014  Falls in the past year? No No No No No    Functional Status Survey: Is the patient deaf or have difficulty hearing?: No Does the patient have difficulty  seeing, even when wearing glasses/contacts?: No Does the patient have difficulty concentrating, remembering, or making decisions?: No Does the patient have difficulty walking or climbing stairs?: No Does the patient have difficulty dressing or bathing?: No Does the patient have difficulty doing errands alone such as visiting a doctor's office or shopping?: No    Assessment & Plan  1. Morbid obesity due to excess calories ([place])  Discussed all optiosns for weight loss medications including Belviq, Qsymia, Saxenda and Contrave. Discussed risk and benefits of each of them. - Liraglutide -Weight Management (SAXENDA) 18 MG/3ML SOPN; Inject 3.2 mg into the skin daily.  Dispense: 3 pen; Refill: 0  2. Type 2 diabetes mellitus with microalbuminuria, with long-term current use of insulin ([facility])  - Liraglutide -Weight Management (SAXENDA) 18 MG/3ML SOPN; Inject 3.2 mg into the skin daily.  Dispense: 3 pen; Refill: 0  3. Elevated PTHrP level ([place])  We will recheck it next visit with calcium and if still high we will need  4. B12 deficiency  - cyanocobalamin ((VITAMIN B-12)) injection 1,000 mcg; Inject 1 mL (1,000 mcg total) into the muscle once.

## 2015-09-09 ENCOUNTER — Ambulatory Visit: Payer: 59 | Admitting: Family Medicine

## 2015-09-13 ENCOUNTER — Other Ambulatory Visit: Payer: Self-pay | Admitting: Family Medicine

## 2015-09-13 ENCOUNTER — Telehealth: Payer: Self-pay | Admitting: Family Medicine

## 2015-09-13 MED ORDER — EXENATIDE ER 2 MG ~~LOC~~ PEN: 2.0000 mg | PEN_INJECTOR | SUBCUTANEOUS | 2 refills | Status: DC

## 2015-09-13 NOTE — Telephone Encounter (Signed)
She wants to go back on Bydureon instead of Saxenda? Is it because of the cost?

## 2015-09-13 NOTE — Telephone Encounter (Signed)
Pt states you and her discussed at her last appt about her going on a new medication for her DM. Pt called to let you know that she wants to remain on what she was on. She doesn't remember the name of the new one but the one she is on now is a injectable.

## 2015-09-13 NOTE — Telephone Encounter (Signed)
Patient wants to go back on Bydureon due to [place] [facility] side effects.

## 2015-09-13 NOTE — Telephone Encounter (Signed)
I don't understand this message.  

## 2015-09-24 ENCOUNTER — Ambulatory Visit: Payer: 59 | Admitting: Family Medicine

## 2015-10-02 ENCOUNTER — Other Ambulatory Visit: Payer: Self-pay

## 2015-10-02 DIAGNOSIS — H6501 Acute serous otitis media, right ear: Secondary | ICD-10-CM

## 2015-10-02 DIAGNOSIS — E785 Hyperlipidemia, unspecified: Secondary | ICD-10-CM

## 2015-10-02 NOTE — Telephone Encounter (Signed)
Patient requesting refill of Atorvastatin to be sent to [facility].

## 2015-10-03 MED ORDER — ATORVASTATIN CALCIUM 40 MG PO TABS: 40.0000 mg | ORAL_TABLET | Freq: Every day | ORAL | 1 refills | Status: DC

## 2015-10-24 ENCOUNTER — Other Ambulatory Visit: Payer: Self-pay | Admitting: Family Medicine

## 2015-10-24 DIAGNOSIS — E038 Other specified hypothyroidism: Secondary | ICD-10-CM

## 2015-10-25 NOTE — Telephone Encounter (Signed)
Patient requesting refill of Levothyroxine to [facility].

## 2015-10-30 ENCOUNTER — Ambulatory Visit (INDEPENDENT_AMBULATORY_CARE_PROVIDER_SITE_OTHER): Payer: 59 | Admitting: Family Medicine

## 2015-10-30 ENCOUNTER — Encounter: Payer: Self-pay | Admitting: Family Medicine

## 2015-10-30 VITALS — BP 114/68 | HR 84 | Temp 98.0°F | Resp 16 | Ht 68.0 in | Wt 257.0 lb

## 2015-10-30 DIAGNOSIS — E1121 Type 2 diabetes mellitus with diabetic nephropathy: Secondary | ICD-10-CM | POA: Diagnosis not present

## 2015-10-30 DIAGNOSIS — N181 Chronic kidney disease, stage 1: Secondary | ICD-10-CM

## 2015-10-30 DIAGNOSIS — E785 Hyperlipidemia, unspecified: Secondary | ICD-10-CM | POA: Diagnosis not present

## 2015-10-30 DIAGNOSIS — E038 Other specified hypothyroidism: Secondary | ICD-10-CM | POA: Diagnosis not present

## 2015-10-30 DIAGNOSIS — E215 Disorder of parathyroid gland, unspecified: Secondary | ICD-10-CM | POA: Diagnosis not present

## 2015-10-30 DIAGNOSIS — E538 Deficiency of other specified B group vitamins: Secondary | ICD-10-CM | POA: Diagnosis not present

## 2015-10-30 DIAGNOSIS — E559 Vitamin D deficiency, unspecified: Secondary | ICD-10-CM

## 2015-10-30 DIAGNOSIS — Z23 Encounter for immunization: Secondary | ICD-10-CM

## 2015-10-30 DIAGNOSIS — E1122 Type 2 diabetes mellitus with diabetic chronic kidney disease: Secondary | ICD-10-CM | POA: Diagnosis not present

## 2015-10-30 DIAGNOSIS — I10 Essential (primary) hypertension: Secondary | ICD-10-CM | POA: Diagnosis not present

## 2015-10-30 DIAGNOSIS — R7989 Other specified abnormal findings of blood chemistry: Secondary | ICD-10-CM

## 2015-10-30 LAB — POCT GLYCOSYLATED HEMOGLOBIN (HGB A1C): Hemoglobin A1C: 6.6

## 2015-10-30 MED ORDER — CYANOCOBALAMIN 1000 MCG/ML IJ SOLN
1000.0000 ug | Freq: Once | INTRAMUSCULAR | Status: AC
  Administered 2015-10-30: 1000 ug via INTRAMUSCULAR

## 2015-10-30 MED ORDER — EXENATIDE ER 2 MG ~~LOC~~ PEN: 2.0000 mg | PEN_INJECTOR | SUBCUTANEOUS | 2 refills | Status: DC

## 2015-10-30 MED ORDER — AMLODIPINE BESY-BENAZEPRIL HCL 5-40 MG PO CAPS: 1.0000 | ORAL_CAPSULE | Freq: Every day | ORAL | 1 refills | Status: DC

## 2015-10-30 NOTE — Addendum Note (Signed)
Addended by: Inda Coke on: 10/30/2015 09:44 AM   Modules accepted: Orders

## 2015-10-30 NOTE — Progress Notes (Signed)
Name: Tabitha Shaw   MRN: [id number]    DOB: [birth year]   Date:10/30/2015       Progress Note  Subjective  Chief Complaint  Chief Complaint  Patient presents with  . Medication Refill    2 month F/U  . Hypertension  . Hyperlipidemia  . Hypothyroidism  . Diabetes    Checks 2x daily Low-110 Average-120 High-140  . Obesity    Lost 1 pound since last visit.    HPI  DMII : using Bydureon and tolerating well, glucose has been 110/120 no hypoglycemic episodes, no side effects of medication and she does nto want to stop taking Bydureon because is helping with her weight also. She has been trying to be compliant with a diabetic diet, she stopped drinking sodas a few weeks ago, , drinking more water and exercising 40 minutes daily - walking at work during her breaks.  Denies polyphagia, polydipsia or polyuria. Taking benazepril for microalbuminuria.  HTN: taking medication daily and denies side effects, she has not been checking her bp at home, no chest pain or palpitation, no dizziness. BP has been towards low end of normal here and we adjusted dose of Spironolactone on her last visit to half pill and since bp is still well controlled we will stop medication today  Hyperlipidemia: taking Atorvastatin, no muscle aches, compliant with medication. Last LDL was elevated  Hypothyroidism: she is taking Synthroid daily , denies constipation she has dry skin. We will recheck TSH  Extreme Obesity: she lost 1 more lbs since last visit, exercising more and she is drinking more water, she wants me to sign her ehealth screen form, but explained that she has not lost weight since last year, and unfortunately I cannot fill it out, I will do it again next year if she loses 5 % of her weight   Patient Active Problem List   Diagnosis Date Noted  . B12 deficiency 09/04/2015  . Elevated PTHrP level (HCC) 09/04/2015  . Benign hypertension 09/12/2014  . Acanthosis nigricans 09/09/2014  . Diabetes  mellitus with renal manifestation (HCC) 09/09/2014  . Dyslipidemia 09/09/2014  . Abnormal electrocardiogram 09/09/2014  . Enlarged kidney 09/09/2014  . 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HCC) 09/09/2014  . Enlarged liver 09/09/2014  . Calcium blood increased 09/09/2014  . Adult hypothyroidism 09/09/2014  . Microalbuminuria 09/09/2014  . Extreme obesity (HCC) 09/09/2014  . Spinal stenosis of lumbar region with neurogenic claudication 09/09/2014  . Osteopenia 09/09/2014  . Vitamin D deficiency 09/09/2014    Past Surgical History:  Procedure Laterality Date  . ABDOMINAL HYSTERECTOMY  2005  . blood transfusion yearly    . BREAST REDUCTION SURGERY    . CESAREAN SECTION    . CESAREAN SECTION  1992  . CHOLECYSTECTOMY  2000  . lapcholecystectomy    . REDUCTION MAMMAPLASTY Bilateral 1985  . VESICOVAGINAL FISTULA CLOSURE W/ TAH      Family History  Problem Relation Age of Onset  . Colon polyps Mother   . Hemachromatosis Mother   . Hypertension Mother   . Depression Sister   . Hypertension Brother   . Cancer Father   . Colon polyps Brother   . Breast cancer Cousin   . Breast cancer Cousin     Social History   Social History  . Marital status: Married    Spouse name: N/A  . Number of children: N/A  . Years of education: N/A   Occupational History  . Not on file.   Social History Main  Topics  . Smoking status: Former Smoker    Types: Cigarettes    Quit date: 01/19/1990  . Smokeless tobacco: Never Used  . Alcohol use 0.0 oz/week  . Drug use: No  . Sexual activity: Yes   Other Topics Concern  . Not on file   Social History Narrative   Married; full time   Her daughter, [name] is a travel Marine scientist      Current Outpatient Prescriptions:  .  amLODipine-benazepril (LOTREL) 5-40 MG capsule, Take 1 capsule by mouth daily., Disp: 90 capsule, Rfl: 1 .  aspirin 81 MG EC tablet, Take 81 mg by mouth daily.  , Disp: , Rfl:  .  atorvastatin (LIPITOR) 40 MG tablet, Take 1 tablet (40 mg  total) by mouth daily., Disp: 90 tablet, Rfl: 1 .  ergocalciferol (VITAMIN D2) 50000 units capsule, Take 1 capsule (50,000 Units total) by mouth once a week., Disp: 12 capsule, Rfl: 0 .  Exenatide ER (BYDUREON) 2 MG PEN, Inject 2 mg into the skin once a week., Disp: 4 each, Rfl: 2 .  fluticasone (FLONASE) 50 MCG/ACT nasal spray, Place 2 sprays into both nostrils daily., Disp: 16 g, Rfl: 0 .  levothyroxine (SYNTHROID, LEVOTHROID) 75 MCG tablet, TAKE 1 TABLET BY MOUTH  DAILY, Disp: 90 tablet, Rfl: 0 .  ONE TOUCH ULTRA TEST test strip, , Disp: , Rfl:   No Known Allergies   ROS  Constitutional: Negative for fever or weight change.  Respiratory: Negative for cough and shortness of breath.   Cardiovascular: Negative for chest pain or palpitations.  Gastrointestinal: Negative for abdominal pain, no bowel changes.  Musculoskeletal: Negative for gait problem or joint swelling.  Skin: Negative for rash.  Neurological: Negative for dizziness or headache.  No other specific complaints in a complete review of systems (except as listed in HPI above).  Objective  Vitals:   10/30/15 0929  BP: 114/68  Pulse: 84  Resp: 16  Temp: 98 F (36.7 C)  TempSrc: Oral  SpO2: 97%  Weight: 257 lb (116.6 kg)  Height: 5\' 8"  (1.727 m)    Body mass index is 39.08 kg/m.  Physical Exam  Constitutional: Patient appears well-developed and well-nourished. Obese No distress.  HEENT: head atraumatic, normocephalic, pupils equal and reactive to light,  neck supple, throat within normal limits Cardiovascular: Normal rate, regular rhythm and normal heart sounds.  No murmur heard. No BLE edema. Pulmonary/Chest: Effort normal and breath sounds normal. No respiratory distress. Abdominal: Soft.  There is no tenderness. Psychiatric: Patient has a normal mood and affect. behavior is normal. Judgment and thought content normal.  Recent Results (from the past 2160 hour(s))  CBC with Differential/Platelet     Status:  Abnormal   Collection Time: 09/03/15 10:51 AM  Result Value Ref Range   WBC 8.0 3.4 - 10.8 x10E3/uL   RBC 4.12 3.77 - 5.28 x10E6/uL   Hemoglobin 15.0 11.1 - 15.9 g/dL   Hematocrit 43.2 34.0 - 46.6 %   MCV 105 (H) 79 - 97 fL   MCH 36.4 (H) 26.6 - 33.0 pg   MCHC 34.7 31.5 - 35.7 g/dL   RDW 13.0 12.3 - 15.4 %   Platelets 245 150 - 379 x10E3/uL   Neutrophils 44 %   Lymphs 48 %   Monocytes 6 %   Eos 1 %   Basos 1 %   Neutrophils Absolute 3.5 1.4 - 7.0 x10E3/uL   Lymphocytes Absolute 3.9 (H) 0.7 - 3.1 x10E3/uL   Monocytes Absolute 0.5  0.1 - 0.9 x10E3/uL   EOS (ABSOLUTE) 0.1 0.0 - 0.4 x10E3/uL   Basophils Absolute 0.0 0.0 - 0.2 x10E3/uL   Immature Granulocytes 0 %   Immature Grans (Abs) 0.0 0.0 - 0.1 x10E3/uL  Comprehensive metabolic panel     Status: Abnormal   Collection Time: 09/03/15 10:51 AM  Result Value Ref Range   Glucose 131 (H) 65 - 99 mg/dL   BUN 9 6 - 24 mg/dL   Creatinine, Ser 0.67 0.57 - 1.00 mg/dL   GFR calc non Af Amer 101 >59 mL/min/1.73   GFR calc Af Amer 116 >59 mL/min/1.73   BUN/Creatinine Ratio 13 9 - 23   Sodium 141 134 - 144 mmol/L   Potassium 4.4 3.5 - 5.2 mmol/L   Chloride 101 96 - 106 mmol/L   CO2 23 18 - 29 mmol/L   Calcium 10.2 8.7 - 10.2 mg/dL   Total Protein 7.2 6.0 - 8.5 g/dL   Albumin 4.4 3.5 - 5.5 g/dL   Globulin, Total 2.8 1.5 - 4.5 g/dL   Albumin/Globulin Ratio 1.6 1.2 - 2.2   Bilirubin Total 0.8 0.0 - 1.2 mg/dL   Alkaline Phosphatase 97 39 - 117 IU/L   AST 50 (H) 0 - 40 IU/L   ALT 53 (H) 0 - 32 IU/L  Lipid panel     Status: Abnormal   Collection Time: 09/03/15 10:51 AM  Result Value Ref Range   Cholesterol, Total 178 100 - 199 mg/dL   Triglycerides 143 0 - 149 mg/dL   HDL 39 (L) >39 mg/dL   VLDL Cholesterol Cal 29 5 - 40 mg/dL   LDL Calculated 110 (H) 0 - 99 mg/dL   Chol/HDL Ratio 4.6 (H) 0.0 - 4.4 ratio units    Comment:                                   T. Chol/HDL Ratio                                             Men  Women                                1/2 Avg.Risk  3.4    3.3                                   Avg.Risk  5.0    4.4                                2X Avg.Risk  9.6    7.1                                3X Avg.Risk 23.4   11.0   TSH     Status: None   Collection Time: 09/03/15 10:51 AM  Result Value Ref Range   TSH 1.140 0.450 - 4.500 uIU/mL  Vitamin B12     Status: None   Collection Time: 09/03/15 10:51 AM  Result Value Ref Range   Vitamin B-12 234 211 -  946 pg/mL  VITAMIN D 25 Hydroxy (Vit-D Deficiency, Fractures)     Status: Abnormal   Collection Time: 09/03/15 10:51 AM  Result Value Ref Range   Vit D, 25-Hydroxy 19.7 (L) 30.0 - 100.0 ng/mL    Comment: Vitamin D deficiency has been defined by the [place] practice guideline as a level of serum 25-OH vitamin D less than 20 ng/mL (1,2). The Endocrine Society went on to further define vitamin D insufficiency as a level between 21 and 29 ng/mL (2). 1. IOM (Institute of Medicine). 2010. Dietary reference    intakes for calcium and D. [place]: The    [employer]. 2. Holick MF, Binkley [place], Bischoff-Ferrari HA, et al.    Evaluation, treatment, and prevention of vitamin D    deficiency: an Endocrine Society clinical practice    guideline. JCEM. 2011 Jul; 96(7):1911-30.   Parathyroid hormone, intact (no Ca)     Status: Abnormal   Collection Time: 09/03/15 10:51 AM  Result Value Ref Range   PTH 79 (H) 15 - 65 pg/mL  Calcium, ionized     Status: None   Collection Time: 09/03/15 10:51 AM  Result Value Ref Range   Calcium, Ion 5.5 4.5 - 5.6 mg/dL  Nicotine/cotinine metabolites     Status: None   Collection Time: 09/03/15 10:51 AM  Result Value Ref Range   Nicotine None Detected ng/mL    Comment: Nicotine levels greater than 2.0 are consistent with the use of tobacco or tobacco cessation products.    Cotinine 2.8 ng/mL    Comment: Cotinine levels greater than 20.0 are consistent with the  use of tobacco or tobacco cessation products.   POCT HgB A1C     Status: None   Collection Time: 10/30/15  9:28 AM  Result Value Ref Range   Hemoglobin A1C 6.6     Diabetic Foot Exam: Diabetic Foot Exam - Simple   Simple Foot Form Diabetic Foot exam was performed with the following findings:  Yes 10/30/2015  9:36 AM  Visual Inspection No deformities, no ulcerations, no other skin breakdown bilaterally:  Yes Sensation Testing Intact to touch and monofilament testing bilaterally:  Yes Pulse Check Posterior Tibialis and Dorsalis pulse intact bilaterally:  Yes Comments      PHQ2/9: Depression screen [facility] 2/9 09/06/2015 06/24/2015 04/18/2015 02/01/2015 09/12/2014  Decreased Interest 0 0 0 0 0  Down, Depressed, Hopeless 0 0 0 0 0  PHQ - 2 Score 0 0 0 0 0     Fall Risk: Fall Risk  09/06/2015 06/24/2015 04/18/2015 02/01/2015 09/12/2014  Falls in the past year? No No No No No    Assessment & Plan  1. Controlled type 2 diabetes mellitus with diabetic nephropathy, without long-term current use of insulin (HCC)  - POCT HgB A1C - Exenatide ER (BYDUREON) 2 MG PEN; Inject 2 mg into the skin once a week.  Dispense: 4 each; Refill: 2  2. Needs flu shot  - Flu Vaccine QUAD 36+ mos PF IM (Fluarix & Fluzone Quad PF)  3. Dyslipidemia  - Lipid panel  4. Morbid obesity due to excess calories [facility])  Discussed with the patient the risk posed by an increased BMI. Discussed importance of portion control, calorie counting and at least 150 minutes of physical activity weekly. Avoid sweet beverages and drink more water. Eat at least 6 servings of fruit and vegetables daily  Discussed MyfitnessPal ap   5. Benign hypertension  - amLODipine-benazepril (LOTREL)  5-40 MG capsule; Take 1 capsule by mouth daily.  Dispense: 90 capsule; Refill: 1 - Comprehensive metabolic panel - CBC with Differential/Platelet  6. Other specified hypothyroidism  - TSH  7. Diabetes mellitus with stage 1 chronic kidney  disease ([place])   8. Elevated PTHrP level (HCC)  - PTH, Intact and Calcium  9. B12 deficiency  - Vitamin B12  10. Vitamin D deficiency  - VITAMIN D 25 Hydroxy (Vit-D Deficiency, Fractures)

## 2015-11-29 ENCOUNTER — Encounter: Payer: Self-pay | Admitting: Oncology

## 2015-12-03 ENCOUNTER — Inpatient Hospital Stay: Payer: 59 | Attending: Oncology

## 2015-12-03 DIAGNOSIS — I10 Essential (primary) hypertension: Secondary | ICD-10-CM | POA: Diagnosis not present

## 2015-12-03 DIAGNOSIS — E669 Obesity, unspecified: Secondary | ICD-10-CM | POA: Insufficient documentation

## 2015-12-03 DIAGNOSIS — Z87891 Personal history of nicotine dependence: Secondary | ICD-10-CM | POA: Insufficient documentation

## 2015-12-03 DIAGNOSIS — E559 Vitamin D deficiency, unspecified: Secondary | ICD-10-CM | POA: Diagnosis not present

## 2015-12-03 DIAGNOSIS — Z79899 Other long term (current) drug therapy: Secondary | ICD-10-CM | POA: Diagnosis not present

## 2015-12-03 DIAGNOSIS — E119 Type 2 diabetes mellitus without complications: Secondary | ICD-10-CM | POA: Diagnosis not present

## 2015-12-03 DIAGNOSIS — E785 Hyperlipidemia, unspecified: Secondary | ICD-10-CM | POA: Insufficient documentation

## 2016-01-12 ENCOUNTER — Other Ambulatory Visit: Payer: Self-pay | Admitting: Family Medicine

## 2016-01-12 DIAGNOSIS — E1121 Type 2 diabetes mellitus with diabetic nephropathy: Secondary | ICD-10-CM

## 2016-01-13 ENCOUNTER — Other Ambulatory Visit: Payer: Self-pay | Admitting: Family Medicine

## 2016-01-13 DIAGNOSIS — E038 Other specified hypothyroidism: Secondary | ICD-10-CM

## 2016-02-05 ENCOUNTER — Ambulatory Visit: Payer: 59 | Admitting: Family Medicine

## 2016-03-03 NOTE — Progress Notes (Signed)
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  INTERVAL HISTORY: Patient returns to clinic today for repeat laboratory work and further evaluation.  She continues to feel well and is asymptomatic.  She has no neurologic complaints.  She denies any recent fevers or illnesses.  She denies any chest pain or shortness of breath.  She denies any weakness or fatigue.  She has a good appetite and denies weight loss. She denies any nausea, vomiting, constipation, or diarrhea. She has no urinary complaints. Patient offers no specific complaints today.  REVIEW OF SYSTEMS:   Review of Systems  Constitutional: Negative.  Negative for fever, malaise/fatigue and weight loss.  Respiratory: Negative.  Negative for cough and shortness of breath.   Cardiovascular: Negative.  Negative for chest pain and leg swelling.  Gastrointestinal: Negative.  Negative for abdominal pain, blood in stool and melena.  Musculoskeletal: Negative.   Neurological: Negative.  Negative for sensory change and weakness.  Psychiatric/Behavioral: Negative.  The patient is not nervous/anxious.     As per HPI. Otherwise, a complete review of systems is negative.  PAST MEDICAL HISTORY: Past Medical History:  Diagnosis Date  . Acquired acanthosis nigricans   . Diabetes type 2, controlled ([place])    if controlled was not specified  . Hemochromatosis   . Hepatomegaly   . HLD (hyperlipidemia)   . HTN  (hypertension)   . Hyperlipidemia   . Hypertension   . Hypertrophy of kidney   . Hypothyroidism   . Intertrigo   . Obesity   . Osteopenia   . Postinflammatory hyperpigmentation   . Snoring   . Thyroid disease   . Vitamin D deficiency     PAST SURGICAL HISTORY: Past Surgical History:  Procedure Laterality Date  . ABDOMINAL HYSTERECTOMY  2005  . blood transfusion yearly    . BREAST REDUCTION SURGERY    . CESAREAN SECTION    . CESAREAN SECTION  1992  . CHOLECYSTECTOMY  2000  . lapcholecystectomy    . REDUCTION MAMMAPLASTY Bilateral 1985  . VESICOVAGINAL FISTULA CLOSURE W/ TAH      FAMILY HISTORY Family History  Problem Relation Age of Onset  . Colon polyps Mother   . Hemachromatosis Mother   . Hypertension Mother   . Depression Sister   . Hypertension Brother   . Cancer Father   . Colon polyps Brother   . Breast cancer Cousin   . Breast cancer Cousin        ADVANCED DIRECTIVES:    HEALTH MAINTENANCE: Social History  Substance Use Topics  . Smoking status: Former Smoker    Types: Cigarettes    Quit date: 01/19/1990  . Smokeless tobacco: Never Used  . Alcohol use 0.0 oz/week     Colonoscopy:  PAP:  Bone density:  Lipid panel:  No Known Allergies  Current Outpatient Prescriptions  Medication Sig Dispense Refill  . amLODipine-benazepril (LOTREL) 5-40 MG capsule Take 1 capsule by mouth daily. 90 capsule 1  . aspirin 81 MG EC tablet Take 81 mg by mouth daily.      Marland Kitchen  atorvastatin (LIPITOR) 40 MG tablet Take 1 tablet (40 mg total) by mouth daily. 90 tablet 1  . BYDUREON 2 MG PEN INJECT SUBCUTANEOUSLY 2MG   ONCE A WEEK 12 each 1  . ergocalciferol (VITAMIN D2) 50000 units capsule Take 1 capsule (50,000 Units total) by mouth once a week. 12 capsule 0  . levothyroxine (SYNTHROID, LEVOTHROID) 75 MCG tablet TAKE 1 TABLET BY MOUTH  DAILY 90 tablet 1  . ONE TOUCH ULTRA TEST test strip     . fluticasone (FLONASE) 50 MCG/ACT nasal spray Place 2 sprays into both  nostrils daily. (Patient not taking: Reported on 03/04/2016) 16 g 0   No current facility-administered medications for this visit.     OBJECTIVE: Vitals:   03/04/16 1442  BP: 127/87  Pulse: 83  Resp: 18  Temp: 98.2 F (36.8 C)     Body mass index is 39.69 kg/m.    ECOG FS:0 - Asymptomatic  General: Well-developed, well-nourished, no acute distress. Eyes: anicteric sclera. Lungs: Clear to auscultation bilaterally. Heart: Regular rate and rhythm. No rubs, murmurs, or gallops. Abdomen: Soft, nontender, nondistended. No organomegaly noted, normoactive bowel sounds. Musculoskeletal: No edema, cyanosis, or clubbing. Neuro: Alert, answering all questions appropriately. Cranial nerves grossly intact. Skin: No rashes or petechiae noted. Psych: Normal affect.    LAB RESULTS:  Lab Results  Component Value Date   NA 141 09/03/2015   K 4.4 09/03/2015   CL 101 09/03/2015   CO2 23 09/03/2015   GLUCOSE 131 (H) 09/03/2015   BUN 9 09/03/2015   CREATININE 0.67 09/03/2015   CALCIUM 10.2 09/03/2015   PROT 7.2 09/03/2015   ALBUMIN 4.4 09/03/2015   AST 50 (H) 09/03/2015   ALT 53 (H) 09/03/2015   ALKPHOS 97 09/03/2015   BILITOT 0.8 09/03/2015   GFRNONAA 101 09/03/2015   GFRAA 116 09/03/2015    Lab Results  Component Value Date   WBC 8.0 09/03/2015   NEUTROABS 3.5 09/03/2015   HGB 16.1 (H) 03/08/2012   HCT 43.2 09/03/2015   MCV 105 (H) 09/03/2015   PLT 245 09/03/2015     STUDIES: No results found.  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  PLAN:    1.  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 Hemoglobin is also mildly elevated at 16.0. The remainder of her laboratory work continues to be within normal limits.  Proceed with 400 mL phlebotomy today. It appears that she only requires phlebotomy every 3 months. Return to clinic in 3 months for phlebotomy and then in 6 months for phlebotomy and further  evaluation.   Patient expressed understanding and was in agreement with this plan. She also understands that She can call clinic at any time with any questions, concerns, or complaints.    Lloyd Huger, MD   03/07/2016 8:45 AM

## 2016-03-04 ENCOUNTER — Inpatient Hospital Stay: Payer: 59 | Attending: Oncology | Admitting: Oncology

## 2016-03-04 ENCOUNTER — Encounter: Payer: Self-pay | Admitting: Oncology

## 2016-03-04 ENCOUNTER — Inpatient Hospital Stay: Payer: 59

## 2016-03-04 DIAGNOSIS — E559 Vitamin D deficiency, unspecified: Secondary | ICD-10-CM | POA: Diagnosis not present

## 2016-03-04 DIAGNOSIS — R160 Hepatomegaly, not elsewhere classified: Secondary | ICD-10-CM | POA: Insufficient documentation

## 2016-03-04 DIAGNOSIS — Z87891 Personal history of nicotine dependence: Secondary | ICD-10-CM | POA: Insufficient documentation

## 2016-03-04 DIAGNOSIS — E669 Obesity, unspecified: Secondary | ICD-10-CM | POA: Insufficient documentation

## 2016-03-04 DIAGNOSIS — E119 Type 2 diabetes mellitus without complications: Secondary | ICD-10-CM | POA: Insufficient documentation

## 2016-03-04 DIAGNOSIS — I10 Essential (primary) hypertension: Secondary | ICD-10-CM | POA: Diagnosis not present

## 2016-03-04 DIAGNOSIS — Z79899 Other long term (current) drug therapy: Secondary | ICD-10-CM | POA: Diagnosis not present

## 2016-03-04 DIAGNOSIS — E785 Hyperlipidemia, unspecified: Secondary | ICD-10-CM | POA: Insufficient documentation

## 2016-03-04 NOTE — Progress Notes (Signed)
Patient is here for follow up, she is doing well no complaints.  

## 2016-03-12 ENCOUNTER — Ambulatory Visit: Payer: 59 | Admitting: Family Medicine

## 2016-03-17 ENCOUNTER — Other Ambulatory Visit: Payer: Self-pay | Admitting: Family Medicine

## 2016-03-17 DIAGNOSIS — E785 Hyperlipidemia, unspecified: Secondary | ICD-10-CM

## 2016-03-18 NOTE — Telephone Encounter (Signed)
Patient requesting refill of Atorvastatin to [facility]. 

## 2016-04-08 ENCOUNTER — Other Ambulatory Visit: Payer: Self-pay | Admitting: Family Medicine

## 2016-04-09 LAB — LIPID PANEL
CHOL/HDL RATIO: 6.4 ratio — AB (ref 0.0–4.4)
CHOLESTEROL TOTAL: 223 mg/dL — AB (ref 100–199)
HDL: 35 mg/dL — AB (ref >39)
LDL Calculated: 139 mg/dL — ABNORMAL HIGH (ref 0–99)
Triglycerides: 247 mg/dL — ABNORMAL HIGH (ref 0–149)
VLDL Cholesterol Cal: 49 mg/dL — ABNORMAL HIGH (ref 5–40)

## 2016-04-09 LAB — CBC WITH DIFFERENTIAL/PLATELET
Basophils Absolute: 0 10*3/uL (ref 0.0–0.2)
Basos: 1 %
EOS (ABSOLUTE): 0.1 10*3/uL (ref 0.0–0.4)
EOS: 2 %
Hematocrit: 44.1 % (ref 34.0–46.6)
Hemoglobin: 15.2 g/dL (ref 11.1–15.9)
IMMATURE GRANULOCYTES: 0 %
Immature Grans (Abs): 0 10*3/uL (ref 0.0–0.1)
Lymphocytes Absolute: 3.1 10*3/uL (ref 0.7–3.1)
Lymphs: 44 %
MCH: 35.8 pg — ABNORMAL HIGH (ref 26.6–33.0)
MCHC: 34.5 g/dL (ref 31.5–35.7)
MCV: 104 fL — ABNORMAL HIGH (ref 79–97)
MONOCYTES: 6 %
MONOS ABS: 0.4 10*3/uL (ref 0.1–0.9)
NEUTROS PCT: 47 %
Neutrophils Absolute: 3.3 10*3/uL (ref 1.4–7.0)
PLATELETS: 258 10*3/uL (ref 150–379)
RBC: 4.25 x10E6/uL (ref 3.77–5.28)
RDW: 12.8 % (ref 12.3–15.4)
WBC: 7 10*3/uL (ref 3.4–10.8)

## 2016-04-09 LAB — COMPREHENSIVE METABOLIC PANEL
A/G RATIO: 1.5 (ref 1.2–2.2)
ALK PHOS: 129 IU/L — AB (ref 39–117)
ALT: 46 IU/L — AB (ref 0–32)
AST: 36 IU/L (ref 0–40)
Albumin: 4.3 g/dL (ref 3.5–5.5)
BUN/Creatinine Ratio: 10 (ref 9–23)
BUN: 7 mg/dL (ref 6–24)
Bilirubin Total: 0.6 mg/dL (ref 0.0–1.2)
CALCIUM: 9.9 mg/dL (ref 8.7–10.2)
CO2: 25 mmol/L (ref 18–29)
Chloride: 102 mmol/L (ref 96–106)
Creatinine, Ser: 0.7 mg/dL (ref 0.57–1.00)
GFR calc Af Amer: 114 mL/min/{1.73_m2} (ref >59)
GFR, EST NON AFRICAN AMERICAN: 99 mL/min/{1.73_m2} (ref >59)
Globulin, Total: 2.8 g/dL (ref 1.5–4.5)
Glucose: 151 mg/dL — ABNORMAL HIGH (ref 65–99)
POTASSIUM: 4.1 mmol/L (ref 3.5–5.2)
SODIUM: 136 mmol/L (ref 134–144)
Total Protein: 7.1 g/dL (ref 6.0–8.5)

## 2016-04-09 LAB — PTH, INTACT AND CALCIUM: PTH: 87 pg/mL — ABNORMAL HIGH (ref 15–65)

## 2016-04-09 LAB — VITAMIN B12: Vitamin B-12: 311 pg/mL (ref 232–1245)

## 2016-04-09 LAB — VITAMIN D 25 HYDROXY (VIT D DEFICIENCY, FRACTURES): VIT D 25 HYDROXY: 17.3 ng/mL — AB (ref 30.0–100.0)

## 2016-04-10 ENCOUNTER — Encounter: Payer: Self-pay | Admitting: Family Medicine

## 2016-04-10 ENCOUNTER — Ambulatory Visit (INDEPENDENT_AMBULATORY_CARE_PROVIDER_SITE_OTHER): Payer: 59 | Admitting: Family Medicine

## 2016-04-10 VITALS — BP 112/64 | HR 83 | Temp 98.0°F | Resp 16 | Ht 68.0 in | Wt 255.6 lb

## 2016-04-10 DIAGNOSIS — I10 Essential (primary) hypertension: Secondary | ICD-10-CM | POA: Diagnosis not present

## 2016-04-10 DIAGNOSIS — E559 Vitamin D deficiency, unspecified: Secondary | ICD-10-CM | POA: Diagnosis not present

## 2016-04-10 DIAGNOSIS — Z794 Long term (current) use of insulin: Secondary | ICD-10-CM | POA: Diagnosis not present

## 2016-04-10 DIAGNOSIS — M2352 Chronic instability of knee, left knee: Secondary | ICD-10-CM | POA: Diagnosis not present

## 2016-04-10 DIAGNOSIS — E038 Other specified hypothyroidism: Secondary | ICD-10-CM

## 2016-04-10 DIAGNOSIS — E785 Hyperlipidemia, unspecified: Secondary | ICD-10-CM

## 2016-04-10 DIAGNOSIS — E215 Disorder of parathyroid gland, unspecified: Secondary | ICD-10-CM | POA: Diagnosis not present

## 2016-04-10 DIAGNOSIS — E538 Deficiency of other specified B group vitamins: Secondary | ICD-10-CM

## 2016-04-10 DIAGNOSIS — R809 Proteinuria, unspecified: Secondary | ICD-10-CM

## 2016-04-10 DIAGNOSIS — E1129 Type 2 diabetes mellitus with other diabetic kidney complication: Secondary | ICD-10-CM | POA: Diagnosis not present

## 2016-04-10 DIAGNOSIS — R7989 Other specified abnormal findings of blood chemistry: Secondary | ICD-10-CM

## 2016-04-10 LAB — POCT GLYCOSYLATED HEMOGLOBIN (HGB A1C): HEMOGLOBIN A1C: 6

## 2016-04-10 MED ORDER — CYANOCOBALAMIN 1000 MCG/ML IJ SOLN
1000.0000 ug | Freq: Once | INTRAMUSCULAR | 0 refills | Status: AC
Start: 2016-04-10 — End: 2016-04-10

## 2016-04-10 MED ORDER — LEVOTHYROXINE SODIUM 75 MCG PO TABS: 75.0000 ug | ORAL_TABLET | Freq: Every day | ORAL | 1 refills | Status: DC

## 2016-04-10 MED ORDER — CYANOCOBALAMIN 1000 MCG/ML IJ SOLN
1000.0000 ug | Freq: Once | INTRAMUSCULAR | Status: AC
  Administered 2016-04-10: 1000 ug via INTRAMUSCULAR

## 2016-04-10 MED ORDER — EXENATIDE ER 2 MG ~~LOC~~ PEN: PEN_INJECTOR | SUBCUTANEOUS | 1 refills | Status: DC

## 2016-04-10 MED ORDER — AMLODIPINE BESY-BENAZEPRIL HCL 5-20 MG PO CAPS: 1.0000 | ORAL_CAPSULE | Freq: Every day | ORAL | 0 refills | Status: DC

## 2016-04-10 NOTE — Progress Notes (Signed)
Name: Tabitha Shaw   MRN: [id number]    DOB: [birth year]   Date:04/10/2016       Progress Note  Subjective  Chief Complaint  Chief Complaint  Patient presents with  . Medication Refill    4 month F/U  . Diabetes    Checks twice daily, Low-100 Average-120's High-130  . Hypertension    Denies any symptoms  . Hyperlipidemia  . Hypothyroidism    Hair loss  . Knee Pain    Onset-years, bilateral pain-they achy intermittently    HPI  DMII : using Bydureon and tolerating well, glucose has been 100-130 no hypoglycemic episodes, no side effects of medication and she does nto want to stop taking Bydureon because is helping with her weight also. She has been trying to be compliant with a diabetic diet, she stopped drinking sodas about 4 months ago, , drinking more water, she started a boot camp but caused back problems - doing well now with chiropractor adjustments ( Dr. Laveda Abbe )  Denies polyphagia, polydipsia or polyuria. Taking benazepril for microalbuminuria.  HTN: taking medication daily and denies side effects, she has not been checking her bp at home, no chest pain or palpitation, no dizziness. BP has been towards low end of normal here we have stopped Spironolactone back Fall 2017 but bp is still low and we will decrease dose of Lotrel  Hyperlipidemia: taking Atorvastatin, no muscle aches, she forgets to take Atorvastatin at night, lipid panel is much worse, advised to take pills in am, continue life style modification.  Hypothyroidism: she is taking Synthroid daily , denies constipation she has dry skin. Last TSH was normal   Extreme Obesity: she lost 5 lbs more lbs since last visit, exercising more and she is drinking more water, she wants me to sign her ehealth screen form Fall 2017, but explained that she has not lost weight since last year, and unfortunately I cannot fill it out, I will do it again next year if she loses 5 % of her weight , she is losing weight now.    Abnormal Pth and vitamin D: we are making referral to Endocrinologist. Denies feeling thirsty, seldom has epigastric pain  Patient Active Problem List   Diagnosis Date Noted  . B12 deficiency 09/04/2015  . Elevated PTHrP level ([place]) 09/04/2015  . Benign hypertension 09/12/2014  . Acanthosis nigricans 09/09/2014  . Diabetes mellitus with renal manifestation ([place]) 09/09/2014  . Dyslipidemia 09/09/2014  . Abnormal electrocardiogram 09/09/2014  . Enlarged kidney 09/09/2014  . 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[place]) 09/09/2014  . Enlarged liver 09/09/2014  . Calcium blood increased 09/09/2014  . Adult hypothyroidism 09/09/2014  . Microalbuminuria 09/09/2014  . Extreme obesity ([place]) 09/09/2014  . Spinal stenosis of lumbar region with neurogenic claudication 09/09/2014  . Osteopenia 09/09/2014  . Vitamin D deficiency 09/09/2014    Past Surgical History:  Procedure Laterality Date  . ABDOMINAL HYSTERECTOMY  2005  . blood transfusion yearly    . BREAST REDUCTION SURGERY    . CESAREAN SECTION    . CESAREAN SECTION  1992  . CHOLECYSTECTOMY  2000  . lapcholecystectomy    . REDUCTION MAMMAPLASTY Bilateral 1985  . VESICOVAGINAL FISTULA CLOSURE W/ TAH      Family History  Problem Relation Age of Onset  . Colon polyps Mother   . Hemachromatosis Mother   . Hypertension Mother   . Depression Sister   . Hypertension Brother   . Cancer Father   . Colon polyps Brother   .  Breast cancer Cousin   . Breast cancer Cousin     Social History   Social History  . Marital status: Married    Spouse name: N/A  . Number of children: N/A  . Years of education: N/A   Occupational History  . Not on file.   Social History Main Topics  . Smoking status: Former Smoker    Types: Cigarettes    Quit date: 01/19/1990  . Smokeless tobacco: Never Used  . Alcohol use 0.0 oz/week  . Drug use: No  . Sexual activity: Yes   Other Topics Concern  . Not on file   Social History Narrative   Married;  full time   Her daughter, [name] is a travel Marine scientist      Current Outpatient Prescriptions:  .  aspirin 81 MG EC tablet, Take 81 mg by mouth daily.  , Disp: , Rfl:  .  atorvastatin (LIPITOR) 40 MG tablet, TAKE 1 TABLET BY MOUTH  DAILY, Disp: 90 tablet, Rfl: 2 .  cholecalciferol (VITAMIN D) 1000 units tablet, Take 1,000 Units by mouth daily., Disp: , Rfl:  .  Exenatide ER (BYDUREON) 2 MG PEN, INJECT SUBCUTANEOUSLY 2MG   ONCE A WEEK, Disp: 12 each, Rfl: 1 .  fluticasone (FLONASE) 50 MCG/ACT nasal spray, Place 2 sprays into both nostrils daily., Disp: 16 g, Rfl: 0 .  levothyroxine (SYNTHROID, LEVOTHROID) 75 MCG tablet, Take 1 tablet (75 mcg total) by mouth daily., Disp: 90 tablet, Rfl: 1 .  ONE TOUCH ULTRA TEST test strip, , Disp: , Rfl:  .  amLODipine-benazepril (LOTREL) 5-20 MG capsule, Take 1 capsule by mouth daily., Disp: 90 capsule, Rfl: 0  No Known Allergies   ROS  Constitutional: Negative for fever , positive for mild weight change.  Respiratory: Negative for cough and shortness of breath.   Cardiovascular: Negative for chest pain or palpitations.  Gastrointestinal: Negative for abdominal pain, no bowel changes.  Musculoskeletal: Negative for gait problem or joint swelling. She states her knees aches Skin: Negative for rash.  Neurological: Negative for dizziness or headache.  No other specific complaints in a complete review of systems (except as listed in HPI above).  Objective  Vitals:   04/10/16 0920  BP: 112/64  Pulse: 83  Resp: 16  Temp: 98 F (36.7 C)  TempSrc: Oral  SpO2: 96%  Weight: 255 lb 9.6 oz (115.9 kg)  Height: 5\' 8"  (1.727 m)    Body mass index is 38.86 kg/m.  Physical Exam  Constitutional: Patient appears well-developed and well-nourished. Obese  No distress.  HEENT: head atraumatic, normocephalic, pupils equal and reactive to light, neck supple, throat within normal limits Cardiovascular: Normal rate, regular rhythm and normal heart sounds.  No  murmur heard. No BLE edema. Pulmonary/Chest: Effort normal and breath sounds normal. No respiratory distress. Abdominal: Soft.  There is no tenderness. Psychiatric: Patient has a normal mood and affect. behavior is normal. Judgment and thought content normal.  Recent Results (from the past 2160 hour(s))  CBC with Differential/Platelet     Status: Abnormal   Collection Time: 04/08/16  7:11 AM  Result Value Ref Range   WBC 7.0 3.4 - 10.8 x10E3/uL   RBC 4.25 3.77 - 5.28 x10E6/uL   Hemoglobin 15.2 11.1 - 15.9 g/dL   Hematocrit 44.1 34.0 - 46.6 %   MCV 104 (H) 79 - 97 fL   MCH 35.8 (H) 26.6 - 33.0 pg   MCHC 34.5 31.5 - 35.7 g/dL   RDW 12.8 12.3 - 15.4 %  Platelets 258 150 - 379 x10E3/uL   Neutrophils 47 Not Estab. %   Lymphs 44 Not Estab. %   Monocytes 6 Not Estab. %   Eos 2 Not Estab. %   Basos 1 Not Estab. %   Neutrophils Absolute 3.3 1.4 - 7.0 x10E3/uL   Lymphocytes Absolute 3.1 0.7 - 3.1 x10E3/uL   Monocytes Absolute 0.4 0.1 - 0.9 x10E3/uL   EOS (ABSOLUTE) 0.1 0.0 - 0.4 x10E3/uL   Basophils Absolute 0.0 0.0 - 0.2 x10E3/uL   Immature Granulocytes 0 Not Estab. %   Immature Grans (Abs) 0.0 0.0 - 0.1 x10E3/uL  Comprehensive metabolic panel     Status: Abnormal   Collection Time: 04/08/16  7:11 AM  Result Value Ref Range   Glucose 151 (H) 65 - 99 mg/dL   BUN 7 6 - 24 mg/dL   Creatinine, Ser 0.70 0.57 - 1.00 mg/dL   GFR calc non Af Amer 99 >59 mL/min/1.73   GFR calc Af Amer 114 >59 mL/min/1.73   BUN/Creatinine Ratio 10 9 - 23   Sodium 136 134 - 144 mmol/L   Potassium 4.1 3.5 - 5.2 mmol/L   Chloride 102 96 - 106 mmol/L   CO2 25 18 - 29 mmol/L   Calcium 9.9 8.7 - 10.2 mg/dL   Total Protein 7.1 6.0 - 8.5 g/dL   Albumin 4.3 3.5 - 5.5 g/dL   Globulin, Total 2.8 1.5 - 4.5 g/dL   Albumin/Globulin Ratio 1.5 1.2 - 2.2   Bilirubin Total 0.6 0.0 - 1.2 mg/dL   Alkaline Phosphatase 129 (H) 39 - 117 IU/L   AST 36 0 - 40 IU/L   ALT 46 (H) 0 - 32 IU/L  Lipid panel     Status: Abnormal    Collection Time: 04/08/16  7:11 AM  Result Value Ref Range   Cholesterol, Total 223 (H) 100 - 199 mg/dL   Triglycerides 247 (H) 0 - 149 mg/dL   HDL 35 (L) >39 mg/dL   VLDL Cholesterol Cal 49 (H) 5 - 40 mg/dL   LDL Calculated 139 (H) 0 - 99 mg/dL   Chol/HDL Ratio 6.4 (H) 0.0 - 4.4 ratio units    Comment:                                   T. Chol/HDL Ratio                                             Men  Women                               1/2 Avg.Risk  3.4    3.3                                   Avg.Risk  5.0    4.4                                2X Avg.Risk  9.6    7.1  3X Avg.Risk 23.4   11.0   PTH, intact and calcium     Status: Abnormal   Collection Time: 04/08/16  7:11 AM  Result Value Ref Range   PTH 87 (H) 15 - 65 pg/mL   PTH Comment     Comment: Interpretation                 Intact PTH    Calcium                                 (pg/mL)      (mg/dL) Normal                          15 - 65     8.6 - 10.2 Primary Hyperparathyroidism         >65          >10.2 Secondary Hyperparathyroidism       >65          <10.2 Non-Parathyroid Hypercalcemia       <65          >10.2 Hypoparathyroidism                  <15          < 8.6 Non-Parathyroid Hypocalcemia    15 - 65          < 8.6   VITAMIN D 25 Hydroxy (Vit-D Deficiency, Fractures)     Status: Abnormal   Collection Time: 04/08/16  7:11 AM  Result Value Ref Range   Vit D, 25-Hydroxy 17.3 (L) 30.0 - 100.0 ng/mL    Comment: Vitamin D deficiency has been defined by the [place] and an Endocrine Society practice guideline as a level of serum 25-OH vitamin D less than 20 ng/mL (1,2). The Endocrine Society went on to further define vitamin D insufficiency as a level between 21 and 29 ng/mL (2). 1. IOM (Institute of Medicine). 2010. Dietary reference    intakes for calcium and D. [place]: The    [employer]. 2. Holick MF, Binkley [place], Bischoff-Ferrari HA, et al.     Evaluation, treatment, and prevention of vitamin D    deficiency: an Endocrine Society clinical practice    guideline. JCEM. 2011 Jul; 96(7):1911-30.   Vitamin B12     Status: None   Collection Time: 04/08/16  7:11 AM  Result Value Ref Range   Vitamin B-12 311 232 - 1,245 pg/mL     PHQ2/9: Depression screen [facility] 2/9 04/10/2016 09/06/2015 06/24/2015 04/18/2015 02/01/2015  Decreased Interest 0 0 0 0 0  Down, Depressed, Hopeless 0 0 0 0 0  PHQ - 2 Score 0 0 0 0 0    Fall Risk: Fall Risk  04/10/2016 09/06/2015 06/24/2015 04/18/2015 02/01/2015  Falls in the past year? No No No No No    Functional Status Survey: Is the patient deaf or have difficulty hearing?: No Does the patient have difficulty seeing, even when wearing glasses/contacts?: No Does the patient have difficulty concentrating, remembering, or making decisions?: No Does the patient have difficulty walking or climbing stairs?: No Does the patient have difficulty dressing or bathing?: No Does the patient have difficulty doing errands alone such as visiting a doctor's office or shopping?: No   Assessment & Plan  1. Type 2 diabetes mellitus with microalbuminuria, with long-term current use of insulin (HCC)  -  POCT HgB A1C - Exenatide ER (BYDUREON) 2 MG PEN; INJECT SUBCUTANEOUSLY 2MG   ONCE A WEEK  Dispense: 12 each; Refill: 1  2. Dyslipidemia  Lipid not at goal, she states she forgets to take medication at night, advised to take in the mornings with other pills.   3. Morbid obesity due to excess calories ([place])  Losing weight, joined a boot camp but caused back problems, doing well at this time, advised personal trainer  4. Benign hypertension  bp is towards low end of normal for two visits, we will decrease dose of Lotrel from 5/40 to 5/20 and monitor.  - amLODipine-benazepril (LOTREL) 5-20 MG capsule; Take 1 capsule by mouth daily.  Dispense: 90 capsule; Refill: 0  5. Other specified hypothyroidism  - levothyroxine  (SYNTHROID, LEVOTHROID) 75 MCG tablet; Take 1 tablet (75 mcg total) by mouth daily.  Dispense: 90 tablet; Refill: 1  6. B12 deficiency  Continue supplementation, she would like a B12 injection today   7. Elevated PTHrP level ([place])  Referral placed for her to see Endocrinologist  8. Vitamin D deficiency  Wait until evaluation by Endocrionologist  9. Recurrent left knee instability  Discussed MRI, PT or discuss with her chiropractor and she chose the later. She will let me know if it gets worse for referral to Ortho

## 2016-04-12 ENCOUNTER — Other Ambulatory Visit: Payer: Self-pay | Admitting: Family Medicine

## 2016-04-16 LAB — SPECIMEN STATUS REPORT

## 2016-04-16 LAB — TSH: TSH: 2.64 u[IU]/mL (ref 0.450–4.500)

## 2016-05-04 ENCOUNTER — Other Ambulatory Visit: Payer: Self-pay | Admitting: Family Medicine

## 2016-05-04 ENCOUNTER — Encounter: Payer: Self-pay | Admitting: Family Medicine

## 2016-05-04 DIAGNOSIS — R7989 Other specified abnormal findings of blood chemistry: Secondary | ICD-10-CM

## 2016-05-06 ENCOUNTER — Other Ambulatory Visit: Payer: Self-pay | Admitting: Family Medicine

## 2016-05-06 DIAGNOSIS — E538 Deficiency of other specified B group vitamins: Secondary | ICD-10-CM

## 2016-05-14 ENCOUNTER — Other Ambulatory Visit: Payer: Self-pay | Admitting: Family Medicine

## 2016-05-14 DIAGNOSIS — Z1231 Encounter for screening mammogram for malignant neoplasm of breast: Secondary | ICD-10-CM

## 2016-05-27 ENCOUNTER — Ambulatory Visit (INDEPENDENT_AMBULATORY_CARE_PROVIDER_SITE_OTHER): Payer: 59

## 2016-05-27 DIAGNOSIS — E538 Deficiency of other specified B group vitamins: Secondary | ICD-10-CM | POA: Diagnosis not present

## 2016-05-27 MED ORDER — CYANOCOBALAMIN 1000 MCG/ML IJ SOLN
1000.0000 ug | Freq: Once | INTRAMUSCULAR | Status: AC
  Administered 2016-05-27: 1000 ug via INTRAMUSCULAR

## 2016-06-03 ENCOUNTER — Inpatient Hospital Stay: Payer: 59 | Attending: Oncology

## 2016-06-03 NOTE — Progress Notes (Signed)
Ferritin levels 84 today. Per Dr Gary Fleet notes goal of Ferritin level 50-100.  No phlebotomy needed today

## 2016-06-15 ENCOUNTER — Other Ambulatory Visit: Payer: Self-pay | Admitting: Family Medicine

## 2016-06-15 DIAGNOSIS — Z794 Long term (current) use of insulin: Principal | ICD-10-CM

## 2016-06-15 DIAGNOSIS — R809 Proteinuria, unspecified: Principal | ICD-10-CM

## 2016-06-15 DIAGNOSIS — E1121 Type 2 diabetes mellitus with diabetic nephropathy: Secondary | ICD-10-CM

## 2016-06-15 DIAGNOSIS — E1129 Type 2 diabetes mellitus with other diabetic kidney complication: Secondary | ICD-10-CM

## 2016-06-16 NOTE — Telephone Encounter (Signed)
Patient requesting refill of Bydureon to [facility].

## 2016-06-16 NOTE — Telephone Encounter (Signed)
Pt was given at 12 week supply with an additional 12 week refill supply on 04/15/2016. This should be enough to get her through to her appointment on 07/13/2016. Please let her know she already has a refill available. If there are any issues getting it filled, please let me know. Thank you!

## 2016-06-17 ENCOUNTER — Ambulatory Visit
Admission: RE | Admit: 2016-06-17 | Discharge: 2016-06-17 | Disposition: A | Discharge status: Home / Self Care | Payer: 59 | Source: Ambulatory Visit | Attending: Family Medicine | Admitting: Family Medicine

## 2016-06-17 DIAGNOSIS — Z1231 Encounter for screening mammogram for malignant neoplasm of breast: Secondary | ICD-10-CM | POA: Insufficient documentation

## 2016-06-17 IMAGING — MG MM DIGITAL SCREENING BILAT W/ TOMO W/ CAD
8 of 14 series · 8 of 30 positions shown · non-contrast
Comparison: Previous exam(s).

CLINICAL DATA: Screening.

EXAM:
2D DIGITAL SCREENING BILATERAL MAMMOGRAM WITH CAD AND ADJUNCT TOMO

[R CV]
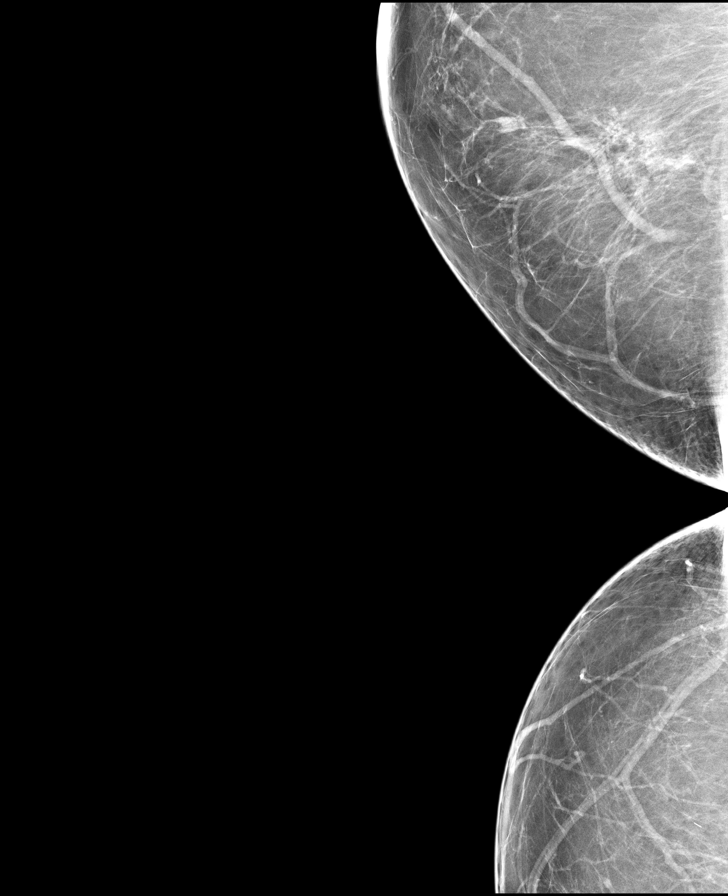

[L CC (1 of 2)]
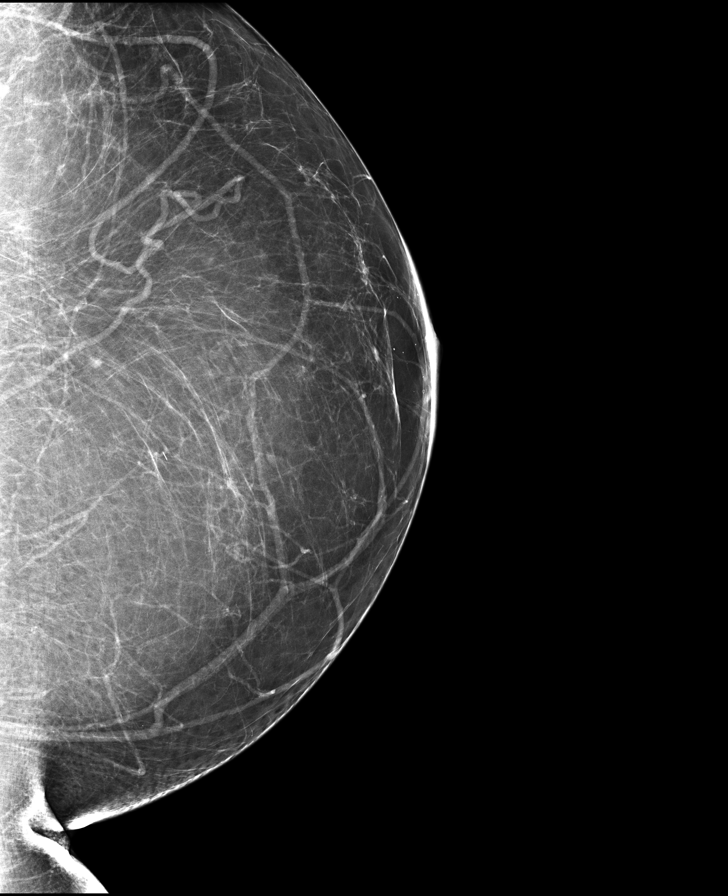

[L CC synth-2D]
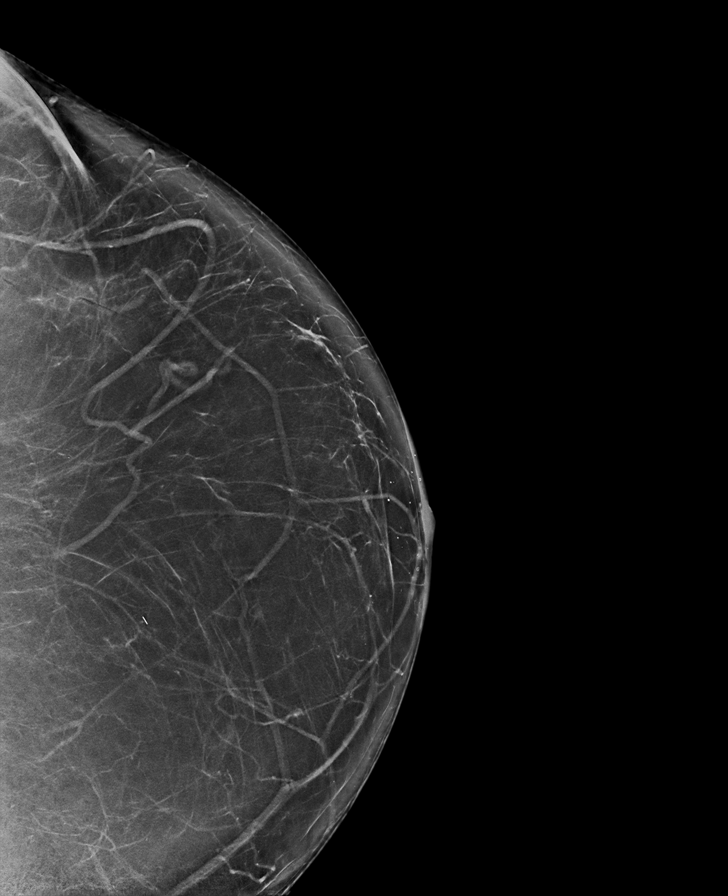

[L CC (2 of 2)]
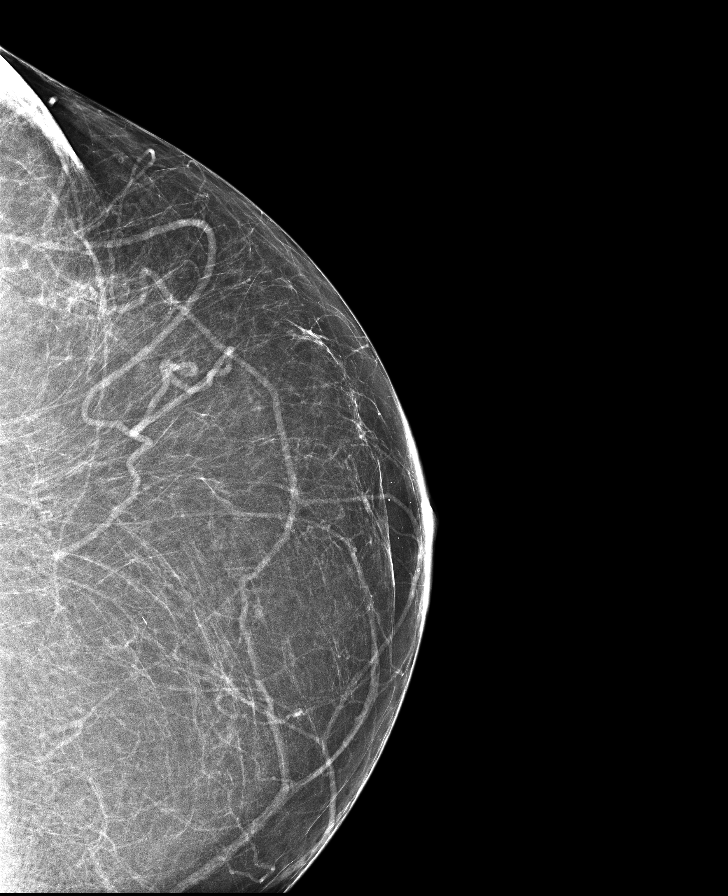

[R MLO synth-2D]
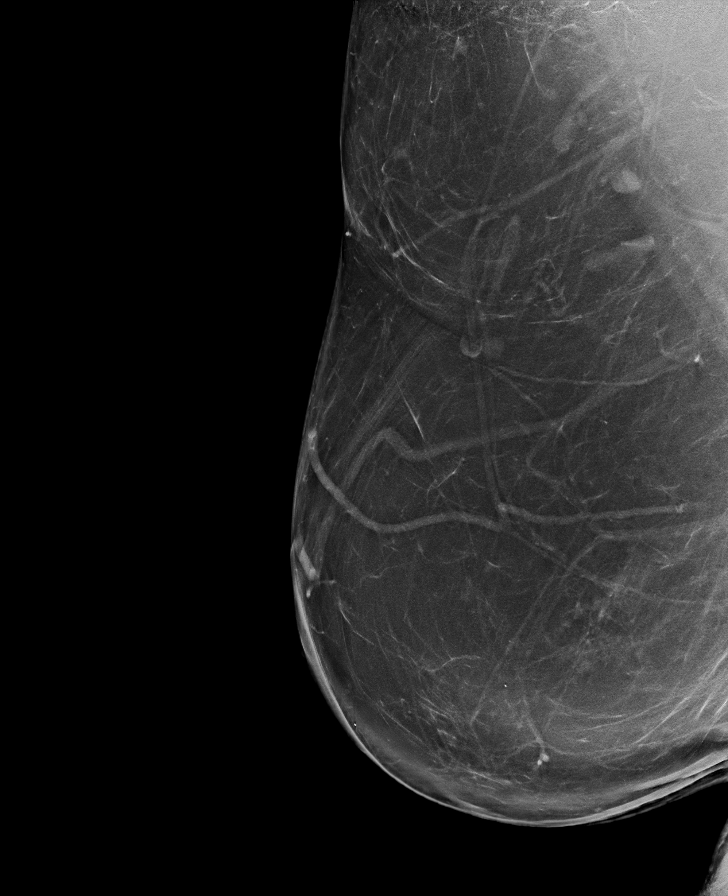

[R MLO]
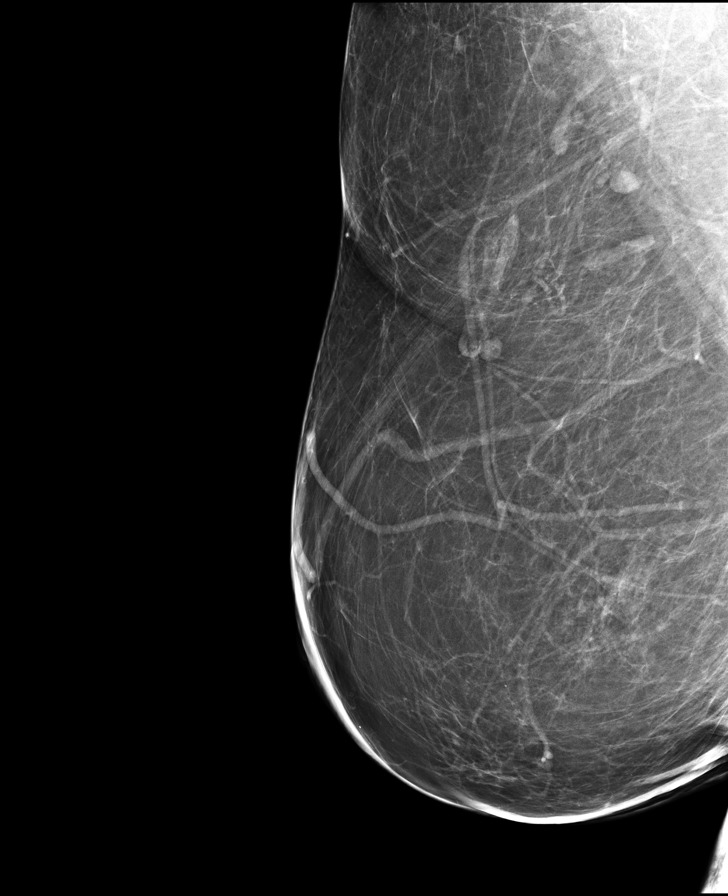

[R CC]
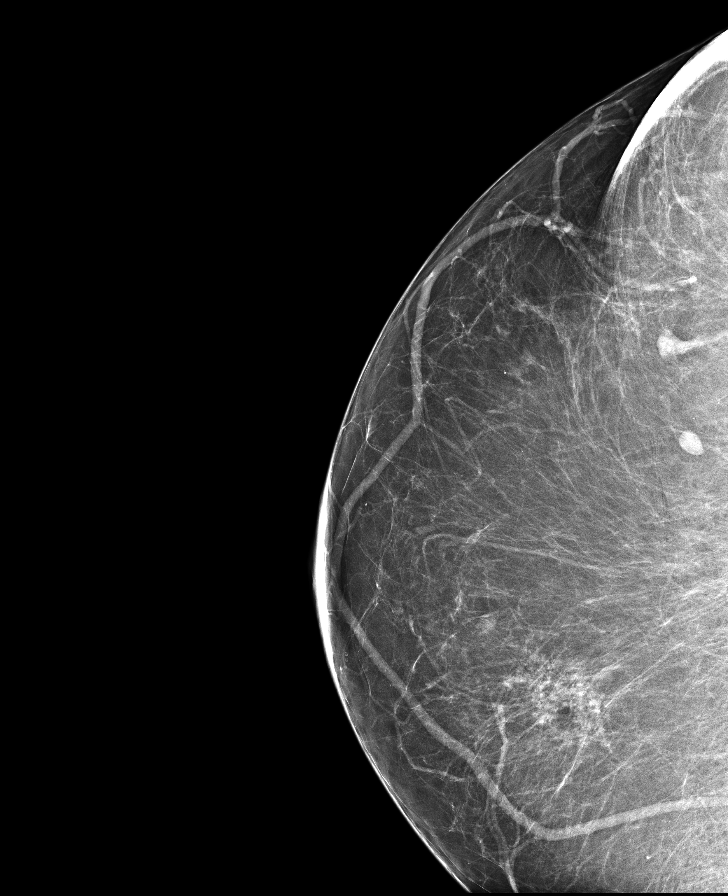

[L MLO]
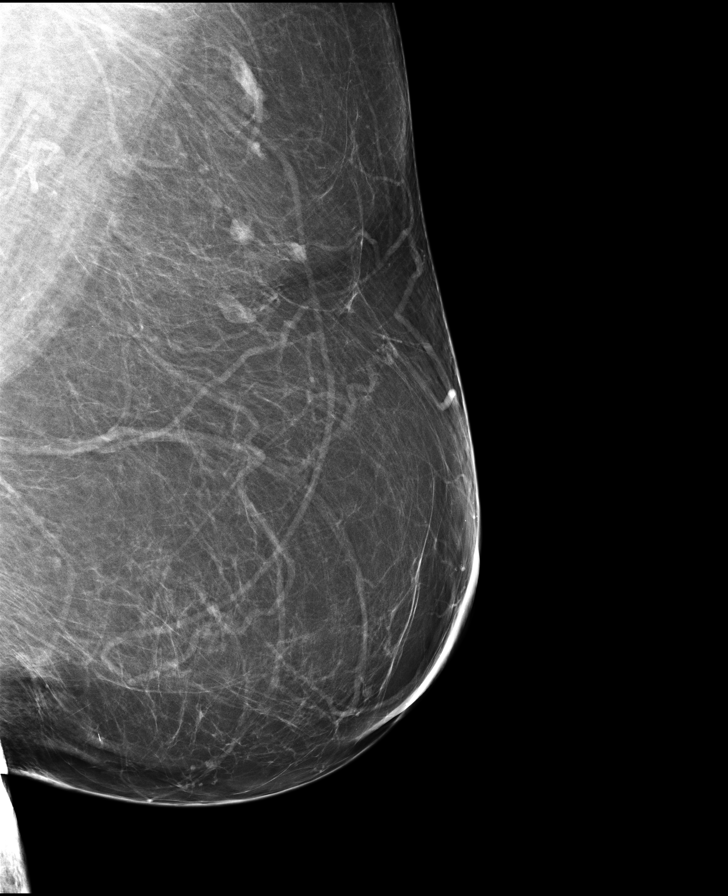

[8 of 30 positions shown; findings below may reference images not displayed]

ACR Breast Density Category b: There are scattered areas of
fibroglandular density.
FINDINGS: There are no findings suspicious for malignancy. Images were
processed with CAD.
IMPRESSION: No mammographic evidence of malignancy. A result letter of this
screening mammogram will be mailed directly to the patient.

RECOMMENDATION:
Screening mammogram in one year. (Code:[33])

BI-RADS CATEGORY  1: Negative.

## 2016-06-27 ENCOUNTER — Other Ambulatory Visit: Payer: Self-pay | Admitting: Family Medicine

## 2016-06-27 DIAGNOSIS — E038 Other specified hypothyroidism: Secondary | ICD-10-CM

## 2016-07-02 ENCOUNTER — Encounter: Payer: Self-pay | Admitting: Oncology

## 2016-07-08 ENCOUNTER — Other Ambulatory Visit: Payer: Self-pay | Admitting: Family Medicine

## 2016-07-08 ENCOUNTER — Encounter: Payer: Self-pay | Admitting: Family Medicine

## 2016-07-08 DIAGNOSIS — E038 Other specified hypothyroidism: Secondary | ICD-10-CM

## 2016-07-08 DIAGNOSIS — R809 Proteinuria, unspecified: Secondary | ICD-10-CM

## 2016-07-08 DIAGNOSIS — R7989 Other specified abnormal findings of blood chemistry: Secondary | ICD-10-CM

## 2016-07-08 DIAGNOSIS — Z794 Long term (current) use of insulin: Secondary | ICD-10-CM

## 2016-07-08 DIAGNOSIS — E785 Hyperlipidemia, unspecified: Secondary | ICD-10-CM

## 2016-07-08 DIAGNOSIS — I10 Essential (primary) hypertension: Secondary | ICD-10-CM

## 2016-07-08 DIAGNOSIS — E559 Vitamin D deficiency, unspecified: Secondary | ICD-10-CM

## 2016-07-08 DIAGNOSIS — E538 Deficiency of other specified B group vitamins: Secondary | ICD-10-CM

## 2016-07-08 DIAGNOSIS — E1129 Type 2 diabetes mellitus with other diabetic kidney complication: Secondary | ICD-10-CM

## 2016-07-09 ENCOUNTER — Other Ambulatory Visit: Payer: Self-pay

## 2016-07-09 DIAGNOSIS — R809 Proteinuria, unspecified: Principal | ICD-10-CM

## 2016-07-09 DIAGNOSIS — E1129 Type 2 diabetes mellitus with other diabetic kidney complication: Secondary | ICD-10-CM

## 2016-07-09 DIAGNOSIS — E785 Hyperlipidemia, unspecified: Secondary | ICD-10-CM

## 2016-07-09 DIAGNOSIS — E038 Other specified hypothyroidism: Secondary | ICD-10-CM

## 2016-07-09 DIAGNOSIS — E559 Vitamin D deficiency, unspecified: Secondary | ICD-10-CM

## 2016-07-09 DIAGNOSIS — Z794 Long term (current) use of insulin: Principal | ICD-10-CM

## 2016-07-09 DIAGNOSIS — I10 Essential (primary) hypertension: Secondary | ICD-10-CM

## 2016-07-09 DIAGNOSIS — R7989 Other specified abnormal findings of blood chemistry: Secondary | ICD-10-CM

## 2016-07-09 LAB — HM DIABETES EYE EXAM

## 2016-07-13 ENCOUNTER — Encounter: Payer: Self-pay | Admitting: Family Medicine

## 2016-07-13 ENCOUNTER — Ambulatory Visit (INDEPENDENT_AMBULATORY_CARE_PROVIDER_SITE_OTHER): Payer: 59 | Admitting: Family Medicine

## 2016-07-13 VITALS — BP 112/68 | HR 86 | Temp 98.4°F | Resp 16 | Ht 68.0 in | Wt 259.0 lb

## 2016-07-13 DIAGNOSIS — I10 Essential (primary) hypertension: Secondary | ICD-10-CM | POA: Diagnosis not present

## 2016-07-13 DIAGNOSIS — E1129 Type 2 diabetes mellitus with other diabetic kidney complication: Secondary | ICD-10-CM | POA: Diagnosis not present

## 2016-07-13 DIAGNOSIS — E215 Disorder of parathyroid gland, unspecified: Secondary | ICD-10-CM | POA: Diagnosis not present

## 2016-07-13 DIAGNOSIS — Z794 Long term (current) use of insulin: Secondary | ICD-10-CM

## 2016-07-13 DIAGNOSIS — E538 Deficiency of other specified B group vitamins: Secondary | ICD-10-CM | POA: Diagnosis not present

## 2016-07-13 DIAGNOSIS — E785 Hyperlipidemia, unspecified: Secondary | ICD-10-CM

## 2016-07-13 DIAGNOSIS — R809 Proteinuria, unspecified: Secondary | ICD-10-CM

## 2016-07-13 DIAGNOSIS — R0981 Nasal congestion: Secondary | ICD-10-CM

## 2016-07-13 DIAGNOSIS — E559 Vitamin D deficiency, unspecified: Secondary | ICD-10-CM | POA: Diagnosis not present

## 2016-07-13 DIAGNOSIS — R7989 Other specified abnormal findings of blood chemistry: Secondary | ICD-10-CM

## 2016-07-13 DIAGNOSIS — E038 Other specified hypothyroidism: Secondary | ICD-10-CM

## 2016-07-13 LAB — POCT GLYCOSYLATED HEMOGLOBIN (HGB A1C): Hemoglobin A1C: 6.4

## 2016-07-13 MED ORDER — FLUTICASONE PROPIONATE 50 MCG/ACT NA SUSP: 2.0000 | Freq: Every day | NASAL | 0 refills | Status: DC

## 2016-07-13 MED ORDER — CYANOCOBALAMIN 1000 MCG/ML IJ SOLN
1000.0000 ug | Freq: Once | INTRAMUSCULAR | Status: AC
  Administered 2016-07-13: 1000 ug via INTRAMUSCULAR

## 2016-07-13 MED ORDER — EXENATIDE ER 2 MG ~~LOC~~ PEN: PEN_INJECTOR | SUBCUTANEOUS | 1 refills | Status: DC

## 2016-07-13 MED ORDER — AMLODIPINE BESY-BENAZEPRIL HCL 5-20 MG PO CAPS: 1.0000 | ORAL_CAPSULE | Freq: Every day | ORAL | 1 refills | Status: DC

## 2016-07-13 MED ORDER — CYANOCOBALAMIN 1000 MCG/ML IJ SOLN: 1000.0000 ug | Freq: Once | INTRAMUSCULAR | 0 refills | Status: DC

## 2016-07-13 MED ORDER — ONETOUCH ULTRA BLUE VI STRP: ORAL_STRIP | 2 refills | Status: DC

## 2016-07-13 NOTE — Addendum Note (Signed)
Addended by: Inda Coke on: 07/13/2016 09:57 AM   Modules accepted: Orders

## 2016-07-13 NOTE — Progress Notes (Signed)
Name: Tabitha Shaw   MRN: [id number]    DOB: [birth year]   Date:07/13/2016       Progress Note  Subjective  Chief Complaint  Chief Complaint  Patient presents with  . Diabetes    3 month follow up  . Hypertension  . Hypothyroidism    HPI  DMII : using Bydureon and tolerating well, glucose has been 110-180 no hypoglycemic episodes, no side effects of medication and she does not want to stop taking Bydureon because is helping with her weight also. She has been trying to be compliant with a diabetic diet, she has stopped drinking sodas, but is drinking again - but only a couple of days a week , drinking more water, she is going to the gym a couple of times a week. . Denies polyphagia, polydipsia or polyuria. Taking benazepril for microalbuminuria.  HTN: taking medication daily and denies side effects, she has not been checking her bp at home, no chest pain or palpitation, no dizziness. BP has been stable.  Hyperlipidemia: taking Atorvastatin, no muscle aches, she forgets to take Atorvastatin at night, lipid panel is much worse, advised to take pills in am, continue life style modification.  Hypothyroidism: she is taking Synthroid daily , denies constipation she has dry skin. Last TSH was normal   Extreme Obesity: she had lost 5 lbs last visit, but gained 3 lbs back since. She will be do for E-health screen at work soon, she is exercising. Discussed Weight [employer]chers.   Abnormal Pth and vitamin D: seen by Dr. Gabriel Carina, she thinks secondary to low vitamin D, she is on higher supplementation with 100000 ui  weekly and has follow up in September.   Nasal congestion: she was seen by Urgent Care beginning of June and was given Zpack for URI, she states most symptoms resolved, but still has nasal congestion, no fever, headache, drainage when present is clear. Denies pruritus. She stopped using nasal spray. Advised to resume it to control symptoms.   Patient Active Problem List   Diagnosis  Date Noted  . B12 deficiency 09/04/2015  . Elevated PTHrP level ([place]) 09/04/2015  . Benign hypertension 09/12/2014  . Acanthosis nigricans 09/09/2014  . Diabetes mellitus with renal manifestation ([place]) 09/09/2014  . Dyslipidemia 09/09/2014  . Abnormal electrocardiogram 09/09/2014  . Enlarged kidney 09/09/2014  . 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[place]) 09/09/2014  . Enlarged liver 09/09/2014  . Calcium blood increased 09/09/2014  . Adult hypothyroidism 09/09/2014  . Microalbuminuria 09/09/2014  . Extreme obesity 09/09/2014  . Spinal stenosis of lumbar region with neurogenic claudication 09/09/2014  . Osteopenia 09/09/2014  . Vitamin D deficiency 09/09/2014    Past Surgical History:  Procedure Laterality Date  . ABDOMINAL HYSTERECTOMY  2005  . blood transfusion yearly    . BREAST REDUCTION SURGERY    . CESAREAN SECTION    . CESAREAN SECTION  1992  . CHOLECYSTECTOMY  2000  . lapcholecystectomy    . REDUCTION MAMMAPLASTY Bilateral 1985  . VESICOVAGINAL FISTULA CLOSURE W/ TAH      Family History  Problem Relation Age of Onset  . Colon polyps Mother   . Hemachromatosis Mother   . Hypertension Mother   . Depression Sister   . Hypertension Brother   . Cancer Father   . Colon polyps Brother   . Breast cancer Cousin   . Breast cancer Cousin     Social History   Social History  . Marital status: Married    Spouse name: N/A  .  Number of children: N/A  . Years of education: N/A   Occupational History  . Not on file.   Social History Main Topics  . Smoking status: Former Smoker    Types: Cigarettes    Quit date: 01/19/1990  . Smokeless tobacco: Never Used  . Alcohol use 0.0 oz/week  . Drug use: No  . Sexual activity: Yes   Other Topics Concern  . Not on file   Social History Narrative   Married; full time   Her daughter, [name] is a travel Marine scientist      Current Outpatient Prescriptions:  .  amLODipine-benazepril (LOTREL) 5-20 MG capsule, Take 1 capsule by mouth daily.,  Disp: 90 capsule, Rfl: 1 .  aspirin 81 MG EC tablet, Take 81 mg by mouth daily.  , Disp: , Rfl:  .  atorvastatin (LIPITOR) 40 MG tablet, TAKE 1 TABLET BY MOUTH  DAILY, Disp: 90 tablet, Rfl: 2 .  cholecalciferol (VITAMIN D) 1000 units tablet, Take 1,000 Units by mouth daily., Disp: , Rfl:  .  cyanocobalamin (,VITAMIN B-12,) 1000 MCG/ML injection, Inject 1 mL (1,000 mcg total) into the muscle once., Disp: 1 mL, Rfl: 0 .  Exenatide ER (BYDUREON) 2 MG PEN, INJECT SUBCUTANEOUSLY 2MG   ONCE A WEEK, Disp: 12 each, Rfl: 1 .  fluticasone (FLONASE) 50 MCG/ACT nasal spray, Place 2 sprays into both nostrils daily., Disp: 48 g, Rfl: 0 .  levothyroxine (SYNTHROID, LEVOTHROID) 75 MCG tablet, TAKE 1 TABLET BY MOUTH  DAILY, Disp: 90 tablet, Rfl: 1 .  ONE TOUCH ULTRA TEST test strip, Check fsbs twice week E11.29, Disp: 100 each, Rfl: 2 .  Vitamin D, Ergocalciferol, (DRISDOL) 50000 units CAPS capsule, Take 1 capsule by mouth 2 (two) times a week., Disp: , Rfl:   No Known Allergies   ROS  Constitutional: Negative for fever or weight change.  Respiratory: Negative for cough and shortness of breath.   Cardiovascular: Negative for chest pain or palpitations.  Gastrointestinal: Negative for abdominal pain, no bowel changes.  Musculoskeletal: Negative for gait problem or joint swelling.  Skin: Negative for rash.  Neurological: Negative for dizziness or headache.  No other specific complaints in a complete review of systems (except as listed in HPI above).  Objective  Vitals:   07/13/16 0904  BP: 112/68  Pulse: 86  Resp: 16  Temp: 98.4 F (36.9 C)  SpO2: 94%  Weight: 259 lb (117.5 kg)  Height: 5\' 8"  (1.727 m)    Body mass index is 39.38 kg/m.  Physical Exam  Constitutional: Patient appears well-developed and well-nourished. Obese  No distress.  HEENT: head atraumatic, normocephalic, pupils equal and reactive to light, neck supple, throat within normal limits Cardiovascular: Normal rate, regular  rhythm and normal heart sounds.  No murmur heard. No BLE edema. Pulmonary/Chest: Effort normal and breath sounds normal. No respiratory distress. Abdominal: Soft.  There is no tenderness. Psychiatric: Patient has a normal mood and affect. behavior is normal. Judgment and thought content normal.  Recent Results (from the past 2160 hour(s))  POCT HgB A1C     Status: Abnormal   Collection Time: 07/13/16  9:11 AM  Result Value Ref Range   Hemoglobin A1C 6.4     PHQ2/9: Depression screen [facility] 2/9 04/10/2016 09/06/2015 06/24/2015 04/18/2015 02/01/2015  Decreased Interest 0 0 0 0 0  Down, Depressed, Hopeless 0 0 0 0 0  PHQ - 2 Score 0 0 0 0 0    Fall Risk: Fall Risk  04/10/2016 09/06/2015 06/24/2015 04/18/2015 02/01/2015  Falls in the past year? No No No No No    Assessment & Plan  1. Type 2 diabetes mellitus with microalbuminuria, with long-term current use of insulin (HCC)  - POCT HgB A1C - ONE TOUCH ULTRA TEST test strip; Check fsbs twice week E11.29  Dispense: 100 each; Refill: 2 - Exenatide ER (BYDUREON) 2 MG PEN; INJECT SUBCUTANEOUSLY 2MG   ONCE A WEEK  Dispense: 12 each; Refill: 1  2. Dyslipidemia  Continue medication  3. Morbid obesity due to excess calories [facility])  Discussed with the patient the risk posed by an increased BMI. Discussed importance of portion control, calorie counting and at least 150 minutes of physical activity weekly. Avoid sweet beverages and drink more water. Eat at least 6 servings of fruit and vegetables daily   4. Elevated PTHrP level ([place])  Continue follow up with Dr. Gabriel Carina  5. Other specified hypothyroidism  Continue Synthroid   6. Benign hypertension  bp towards low end of normal but no orthostatic changes - amLODipine-benazepril (LOTREL) 5-20 MG capsule; Take 1 capsule by mouth daily.  Dispense: 90 capsule; Refill: 1  7. Vitamin D deficiency  Continue supplementation   8. 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[place])  Seeing Dr. Grayland Ormond  9. Nasal  congestion  - fluticasone (FLONASE) 50 MCG/ACT nasal spray; Place 2 sprays into both nostrils daily.  Dispense: 48 g; Refill: 0  10. B12 deficiency  - cyanocobalamin (,VITAMIN B-12,) 1000 MCG/ML injection; Inject 1 mL (1,000 mcg total) into the muscle once.  Dispense: 1 mL; Refill: 0

## 2016-08-11 ENCOUNTER — Other Ambulatory Visit: Payer: Self-pay | Admitting: Family Medicine

## 2016-08-11 DIAGNOSIS — E538 Deficiency of other specified B group vitamins: Secondary | ICD-10-CM

## 2016-08-11 NOTE — Telephone Encounter (Signed)
Patient requesting refill of B12 to [facility].

## 2016-08-28 ENCOUNTER — Ambulatory Visit (INDEPENDENT_AMBULATORY_CARE_PROVIDER_SITE_OTHER): Payer: 59 | Admitting: Family Medicine

## 2016-08-28 ENCOUNTER — Encounter: Payer: Self-pay | Admitting: Family Medicine

## 2016-08-28 VITALS — BP 132/84 | HR 88 | Temp 98.3°F | Resp 16 | Ht 67.0 in | Wt 260.5 lb

## 2016-08-28 DIAGNOSIS — Z124 Encounter for screening for malignant neoplasm of cervix: Secondary | ICD-10-CM | POA: Diagnosis not present

## 2016-08-28 DIAGNOSIS — Z01419 Encounter for gynecological examination (general) (routine) without abnormal findings: Secondary | ICD-10-CM

## 2016-08-28 NOTE — Patient Instructions (Signed)
Preventive Care 40-64 Years, Female Preventive care refers to lifestyle choices and visits with your health care provider that can promote health and wellness. What does preventive care include?  A yearly physical exam. This is also called an annual well check.  Dental exams once or twice a year.  Routine eye exams. Ask your health care provider how often you should have your eyes checked.  Personal lifestyle choices, including: ? Daily care of your teeth and gums. ? Regular physical activity. ? Eating a healthy diet. ? Avoiding tobacco and drug use. ? Limiting alcohol use. ? Practicing safe sex. ? Taking low-dose aspirin daily starting at age 58. ? Taking vitamin and mineral supplements as recommended by your health care provider. What happens during an annual well check? The services and screenings done by your health care provider during your annual well check will depend on your age, overall health, lifestyle risk factors, and family history of disease. Counseling Your health care provider may ask you questions about your:  Alcohol use.  Tobacco use.  Drug use.  Emotional well-being.  Home and relationship well-being.  Sexual activity.  Eating habits.  Work and work Statistician.  Method of birth control.  Menstrual cycle.  Pregnancy history.  Screening You may have the following tests or measurements:  Height, weight, and BMI.  Blood pressure.  Lipid and cholesterol levels. These may be checked every 5 years, or more frequently if you are over 81 years old.  Skin check.  Lung cancer screening. You may have this screening every year starting at age 78 if you have a 30-pack-year history of smoking and currently smoke or have quit within the past 15 years.  Fecal occult blood test (FOBT) of the stool. You may have this test every year starting at age 65.  Flexible sigmoidoscopy or colonoscopy. You may have a sigmoidoscopy every 5 years or a colonoscopy  every 10 years starting at age 30.  Hepatitis C blood test.  Hepatitis B blood test.  Sexually transmitted disease (STD) testing.  Diabetes screening. This is done by checking your blood sugar (glucose) after you have not eaten for a while (fasting). You may have this done every 1-3 years.  Mammogram. This may be done every 1-2 years. Talk to your health care provider about when you should start having regular mammograms. This may depend on whether you have a family history of breast cancer.  BRCA-related cancer screening. This may be done if you have a family history of breast, ovarian, tubal, or peritoneal cancers.  Pelvic exam and Pap test. This may be done every 3 years starting at age 80. Starting at age 36, this may be done every 5 years if you have a Pap test in combination with an HPV test.  Bone density scan. This is done to screen for osteoporosis. You may have this scan if you are at high risk for osteoporosis.  Discuss your test results, treatment options, and if necessary, the need for more tests with your health care provider. Vaccines Your health care provider may recommend certain vaccines, such as:  Influenza vaccine. This is recommended every year.  Tetanus, diphtheria, and acellular pertussis (Tdap, Td) vaccine. You may need a Td booster every 10 years.  Varicella vaccine. You may need this if you have not been vaccinated.  Zoster vaccine. You may need this after age 5.  Measles, mumps, and rubella (MMR) vaccine. You may need at least one dose of MMR if you were born in  1957 or later. You may also need a second dose.  Pneumococcal 13-valent conjugate (PCV13) vaccine. You may need this if you have certain conditions and were not previously vaccinated.  Pneumococcal polysaccharide (PPSV23) vaccine. You may need one or two doses if you smoke cigarettes or if you have certain conditions.  Meningococcal vaccine. You may need this if you have certain  conditions.  Hepatitis A vaccine. You may need this if you have certain conditions or if you travel or work in places where you may be exposed to hepatitis A.  Hepatitis B vaccine. You may need this if you have certain conditions or if you travel or work in places where you may be exposed to hepatitis B.  Haemophilus influenzae type b (Hib) vaccine. You may need this if you have certain conditions.  Talk to your health care provider about which screenings and vaccines you need and how often you need them. This information is not intended to replace advice given to you by your health care provider. Make sure you discuss any questions you have with your health care provider. Document Released: 02/01/2015 Document Revised: 09/25/2015 Document Reviewed: 11/06/2014 Elsevier Interactive Patient Education  2017 [employer].

## 2016-08-28 NOTE — Progress Notes (Signed)
Name: Tabitha Shaw   MRN: [id number]    DOB: 1960-09-14   Date:08/28/2016       Progress Note  Subjective  Chief Complaint  Chief Complaint  Patient presents with  . Annual Exam    HPI   Well Woman: had a hysterectomy but not sure if she has a cervix. She is sexually active, no pain during intercourse, no vaginal discharge. No breast lumps of nipple discharge. No change in bowel movements or blood in stools. Up to date with colonoscopy, mammogram and cervical cancer screening, but she would like to have a repeat pap smear today.  She also needs labs for work and would like more labs for all her medical problems  Patient Active Problem List   Diagnosis Date Noted  . B12 deficiency 09/04/2015  . Elevated PTHrP level ([place]) 09/04/2015  . Benign hypertension 09/12/2014  . Acanthosis nigricans 09/09/2014  . Diabetes mellitus with renal manifestation ([place]) 09/09/2014  . Dyslipidemia 09/09/2014  . Abnormal electrocardiogram 09/09/2014  . Enlarged kidney 09/09/2014  . 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[place]) 09/09/2014  . Enlarged liver 09/09/2014  . Calcium blood increased 09/09/2014  . Adult hypothyroidism 09/09/2014  . Microalbuminuria 09/09/2014  . Extreme obesity 09/09/2014  . Spinal stenosis of lumbar region with neurogenic claudication 09/09/2014  . Osteopenia 09/09/2014  . Vitamin D deficiency 09/09/2014    Past Surgical History:  Procedure Laterality Date  . ABDOMINAL HYSTERECTOMY  2005  . blood transfusion yearly    . BREAST REDUCTION SURGERY    . CESAREAN SECTION    . CESAREAN SECTION  1992  . CHOLECYSTECTOMY  2000  . lapcholecystectomy    . REDUCTION MAMMAPLASTY Bilateral 1985  . VESICOVAGINAL FISTULA CLOSURE W/ TAH      Family History  Problem Relation Age of Onset  . Colon polyps Mother   . Hemachromatosis Mother   . Hypertension Mother   . Depression Sister   . Hypertension Brother   . Cancer Father   . Colon polyps Brother   . Breast cancer Cousin   .  Breast cancer Cousin     Social History   Social History  . Marital status: Married    Spouse name: N/A  . Number of children: N/A  . Years of education: N/A   Occupational History  . Not on file.   Social History Main Topics  . Smoking status: Former Smoker    Types: Cigarettes    Quit date: 01/19/1990  . Smokeless tobacco: Never Used  . Alcohol use 0.0 oz/week     Comment: occasionally  . Drug use: No  . Sexual activity: Yes    Partners: Male   Other Topics Concern  . Not on file   Social History Narrative   Married; full time   Her daughter, [name] is a travel Marine scientist      Current Outpatient Prescriptions:  .  amLODipine-benazepril (LOTREL) 5-20 MG capsule, Take 1 capsule by mouth daily., Disp: 90 capsule, Rfl: 1 .  aspirin 81 MG EC tablet, Take 81 mg by mouth daily.  , Disp: , Rfl:  .  atorvastatin (LIPITOR) 40 MG tablet, TAKE 1 TABLET BY MOUTH  DAILY, Disp: 90 tablet, Rfl: 2 .  cholecalciferol (VITAMIN D) 1000 units tablet, Take 1,000 Units by mouth daily., Disp: , Rfl:  .  Exenatide ER (BYDUREON) 2 MG PEN, INJECT SUBCUTANEOUSLY 2MG   ONCE A WEEK, Disp: 12 each, Rfl: 1 .  fluticasone (FLONASE) 50 MCG/ACT nasal spray, Place 2 sprays into  both nostrils daily., Disp: 48 g, Rfl: 0 .  levothyroxine (SYNTHROID, LEVOTHROID) 75 MCG tablet, TAKE 1 TABLET BY MOUTH  DAILY, Disp: 90 tablet, Rfl: 1 .  ONE TOUCH ULTRA TEST test strip, Check fsbs twice week E11.29, Disp: 100 each, Rfl: 2 .  Vitamin D, Ergocalciferol, (DRISDOL) 50000 units CAPS capsule, Take 1 capsule by mouth 2 (two) times a week., Disp: , Rfl:   No Known Allergies   ROS  Constitutional: Negative for fever or weight change.  Respiratory: Negative for cough and shortness of breath.   Cardiovascular: Negative for chest pain or palpitations.  Gastrointestinal: Negative for abdominal pain, no bowel changes.  Musculoskeletal: Negative for gait problem or joint swelling.  Skin: Negative for rash.  Neurological:  Negative for dizziness or headache.  No other specific complaints in a complete review of systems (except as listed in HPI above).   Objective  Vitals:   08/28/16 0829  BP: 132/84  Pulse: 88  Resp: 16  Temp: 98.3 F (36.8 C)  TempSrc: Oral  SpO2: 96%  Weight: 260 lb 8 oz (118.2 kg)  Height: 5\' 7"  (1.702 m)    Body mass index is 40.8 kg/m.  Physical Exam  Constitutional: Patient appears well-developed and well-nourished. Obese No distress.  HENT: Head: Normocephalic and atraumatic. Ears: B TMs ok, no erythema or effusion; Nose: Nose normal. Mouth/Throat: Oropharynx is clear and moist. No oropharyngeal exudate.  Eyes: Conjunctivae and EOM are normal. Pupils are equal, round, and reactive to light. No scleral icterus.  Neck: Normal range of motion. Neck supple. No JVD present. No thyromegaly present.  Cardiovascular: Normal rate, regular rhythm and normal heart sounds.  No murmur heard. No BLE edema. Pulmonary/Chest: Effort normal and breath sounds normal. No respiratory distress. Abdominal: Soft. Bowel sounds are normal, no distension. There is no tenderness. no masses Breast: no lumps or masses, no nipple discharge or rashes, history of breast reduction surgery  FEMALE GENITALIA:  External genitalia normal External urethra normal Vaginal vault normal without discharge or lesions Cervix normal without discharge or lesions Bimanual exam normal without masses RECTAL: not done Musculoskeletal: Normal range of motion, no joint effusions. No gross deformities Neurological: he is alert and oriented to person, place, and time. No cranial nerve deficit. Coordination, balance, strength, speech and gait are normal.  Skin: Skin is warm and dry. No rash noted. Acanthosis nigricans on neck  Psychiatric: Patient has a normal mood and affect. behavior is normal. Judgment and thought content normal.  Recent Results (from the past 2160 hour(s))  HM DIABETES EYE EXAM     Status: None    Collection Time: 07/09/16 12:00 AM  Result Value Ref Range   HM Diabetic Eye Exam No Retinopathy No Retinopathy    Comment: [facility] with Eula Flax  POCT HgB A1C     Status: Abnormal   Collection Time: 07/13/16  9:11 AM  Result Value Ref Range   Hemoglobin A1C 6.4      PHQ2/9: Depression screen [facility] 2/9 08/28/2016 04/10/2016 09/06/2015 06/24/2015 04/18/2015  Decreased Interest 0 0 0 0 0  Down, Depressed, Hopeless 0 0 0 0 0  PHQ - 2 Score 0 0 0 0 0    Fall Risk: Fall Risk  08/28/2016 04/10/2016 09/06/2015 06/24/2015 04/18/2015  Falls in the past year? No No No No No    Functional Status Survey: Is the patient deaf or have difficulty hearing?: No Does the patient have difficulty seeing, even when wearing glasses/contacts?: No Does the  patient have difficulty concentrating, remembering, or making decisions?: No Does the patient have difficulty walking or climbing stairs?: No Does the patient have difficulty dressing or bathing?: No Does the patient have difficulty doing errands alone such as visiting a doctor's office or shopping?: No  Current Exercise Habits: Structured exercise class, Type of exercise: calisthenics (Zumba Classes), Time (Minutes): 60, Frequency (Times/Week): 2, Weekly Exercise (Minutes/Week): 120, Intensity: Moderate    Assessment & Plan  1. Well woman exam  Discussed importance of 150 minutes of physical activity weekly, eat two servings of fish weekly, eat one serving of tree nuts ( cashews, pistachios, pecans, almonds.Marland Kitchen) every other day, eat 6 servings of fruit/vegetables daily and drink plenty of water and avoid sweet beverages.  - Hemoglobin A1c - Lipid panel - Comp. Metabolic Panel (12) - Parathyroid hormone, intact (no Ca) - Thyroid Panel With TSH - VITAMIN D 25 Hydroxy (Vit-D Deficiency, Fractures) - Vitamin B12 - CBC with Differential/Platelet - Iron, TIBC and Ferritin Panel  2. Cervical cancer screening  - pap smear with HPV

## 2016-08-31 LAB — PAP IG AND HPV HIGH-RISK
HPV, high-risk: POSITIVE — AB
PAP Smear Comment: 0

## 2016-08-31 LAB — PLEASE NOTE

## 2016-09-02 ENCOUNTER — Inpatient Hospital Stay: Payer: 59

## 2016-09-02 ENCOUNTER — Inpatient Hospital Stay: Payer: 59 | Admitting: Oncology

## 2016-09-03 ENCOUNTER — Encounter: Payer: Self-pay | Admitting: Family Medicine

## 2016-09-03 DIAGNOSIS — R87810 Cervical high risk human papillomavirus (HPV) DNA test positive: Secondary | ICD-10-CM | POA: Insufficient documentation

## 2016-09-07 ENCOUNTER — Other Ambulatory Visit: Payer: Self-pay | Admitting: Family Medicine

## 2016-09-08 LAB — COMPREHENSIVE METABOLIC PANEL
A/G RATIO: 1.7 (ref 1.2–2.2)
ALT: 42 IU/L — AB (ref 0–32)
AST: 36 IU/L (ref 0–40)
Albumin: 4.6 g/dL (ref 3.5–5.5)
Alkaline Phosphatase: 108 IU/L (ref 39–117)
BILIRUBIN TOTAL: 0.8 mg/dL (ref 0.0–1.2)
BUN/Creatinine Ratio: 14 (ref 9–23)
BUN: 9 mg/dL (ref 6–24)
CHLORIDE: 102 mmol/L (ref 96–106)
CO2: 25 mmol/L (ref 20–29)
Calcium: 10.6 mg/dL — ABNORMAL HIGH (ref 8.7–10.2)
Creatinine, Ser: 0.63 mg/dL (ref 0.57–1.00)
GFR calc Af Amer: 118 mL/min/{1.73_m2} (ref >59)
GFR calc non Af Amer: 102 mL/min/{1.73_m2} (ref >59)
Globulin, Total: 2.7 g/dL (ref 1.5–4.5)
Glucose: 133 mg/dL — ABNORMAL HIGH (ref 65–99)
POTASSIUM: 4.7 mmol/L (ref 3.5–5.2)
Sodium: 141 mmol/L (ref 134–144)
Total Protein: 7.3 g/dL (ref 6.0–8.5)

## 2016-09-08 LAB — LIPID PANEL W/O CHOL/HDL RATIO
Cholesterol, Total: 178 mg/dL (ref 100–199)
HDL: 42 mg/dL (ref >39)
LDL Calculated: 109 mg/dL — ABNORMAL HIGH (ref 0–99)
TRIGLYCERIDES: 135 mg/dL (ref 0–149)
VLDL Cholesterol Cal: 27 mg/dL (ref 5–40)

## 2016-09-08 LAB — HGB A1C W/O EAG: Hgb A1c MFr Bld: 6.3 % — ABNORMAL HIGH (ref 4.8–5.6)

## 2016-09-11 ENCOUNTER — Telehealth: Payer: Self-pay | Admitting: Family Medicine

## 2016-09-11 NOTE — Telephone Encounter (Signed)
Form is with Jonelle Sidle, she is printing off labs and patient has been informed to come by to pick up.

## 2016-09-11 NOTE — Telephone Encounter (Signed)
Pt came by and picked up the form this morning.

## 2016-09-11 NOTE — Telephone Encounter (Signed)
Pt did not leave form with me and it has not been scanned into her chart. I will print off lab results

## 2016-09-11 NOTE — Telephone Encounter (Signed)
At patient last visit (08/28/16) she had left the assessment form for her job to be filled out (the e-screening form). She is going to stop by today around 11 to get it and she is needing a copy of her labs.

## 2016-09-14 ENCOUNTER — Encounter: Payer: Self-pay | Admitting: Family Medicine

## 2016-09-15 ENCOUNTER — Other Ambulatory Visit: Payer: Self-pay | Admitting: Family Medicine

## 2016-09-17 ENCOUNTER — Encounter: Payer: Self-pay | Admitting: Family Medicine

## 2016-09-18 ENCOUNTER — Other Ambulatory Visit: Payer: Self-pay | Admitting: Family Medicine

## 2016-09-18 ENCOUNTER — Encounter: Payer: Self-pay | Admitting: Family Medicine

## 2016-09-18 DIAGNOSIS — I10 Essential (primary) hypertension: Secondary | ICD-10-CM

## 2016-09-18 DIAGNOSIS — E538 Deficiency of other specified B group vitamins: Secondary | ICD-10-CM

## 2016-09-29 ENCOUNTER — Other Ambulatory Visit: Payer: Self-pay | Admitting: Family Medicine

## 2016-09-29 DIAGNOSIS — I10 Essential (primary) hypertension: Secondary | ICD-10-CM

## 2016-09-29 NOTE — Telephone Encounter (Signed)
Pt had a prescription for amlodipine-benazepril 5-20mg . She is needing this refilled today. Asking that you send one refill to [facility] and the other to her mail order ([facility]).

## 2016-09-30 MED ORDER — AMLODIPINE BESY-BENAZEPRIL HCL 5-20 MG PO CAPS: 1.0000 | ORAL_CAPSULE | Freq: Every day | ORAL | 0 refills | Status: DC

## 2016-10-01 ENCOUNTER — Other Ambulatory Visit: Payer: Self-pay | Admitting: Family Medicine

## 2016-10-01 DIAGNOSIS — E1121 Type 2 diabetes mellitus with diabetic nephropathy: Secondary | ICD-10-CM

## 2016-10-12 ENCOUNTER — Other Ambulatory Visit: Payer: Self-pay | Admitting: Family Medicine

## 2016-10-13 ENCOUNTER — Encounter: Payer: Self-pay | Admitting: Family Medicine

## 2016-10-13 ENCOUNTER — Ambulatory Visit (INDEPENDENT_AMBULATORY_CARE_PROVIDER_SITE_OTHER): Payer: 59 | Admitting: Family Medicine

## 2016-10-13 VITALS — BP 100/80 | HR 81 | Wt 262.9 lb

## 2016-10-13 DIAGNOSIS — R7989 Other specified abnormal findings of blood chemistry: Secondary | ICD-10-CM

## 2016-10-13 DIAGNOSIS — Z794 Long term (current) use of insulin: Secondary | ICD-10-CM

## 2016-10-13 DIAGNOSIS — E1129 Type 2 diabetes mellitus with other diabetic kidney complication: Secondary | ICD-10-CM | POA: Diagnosis not present

## 2016-10-13 DIAGNOSIS — I10 Essential (primary) hypertension: Secondary | ICD-10-CM

## 2016-10-13 DIAGNOSIS — E785 Hyperlipidemia, unspecified: Secondary | ICD-10-CM | POA: Diagnosis not present

## 2016-10-13 DIAGNOSIS — E559 Vitamin D deficiency, unspecified: Secondary | ICD-10-CM

## 2016-10-13 DIAGNOSIS — E039 Hypothyroidism, unspecified: Secondary | ICD-10-CM | POA: Diagnosis not present

## 2016-10-13 DIAGNOSIS — E215 Disorder of parathyroid gland, unspecified: Secondary | ICD-10-CM | POA: Diagnosis not present

## 2016-10-13 DIAGNOSIS — R809 Proteinuria, unspecified: Secondary | ICD-10-CM

## 2016-10-13 MED ORDER — AMLODIPINE BESY-BENAZEPRIL HCL 5-20 MG PO CAPS: 1.0000 | ORAL_CAPSULE | Freq: Every day | ORAL | 1 refills | Status: DC

## 2016-10-13 MED ORDER — SEMAGLUTIDE(0.25 OR 0.5MG/DOS) 2 MG/1.5ML ~~LOC~~ SOPN: 0.5000 mg | PEN_INJECTOR | SUBCUTANEOUS | 0 refills | Status: DC

## 2016-10-13 NOTE — Progress Notes (Signed)
Name: Tabitha Shaw   MRN: [id number]    DOB: November 02, 1960   Date:10/13/2016       Progress Note  Subjective  Chief Complaint  No chief complaint on file.   HPI  DMII : using Bydureon and tolerating well, glucose has been 104-120 fasting, no hypoglycemic episodes, no side effects of medication, she is gaining weight, we will try switching to Ozempic to help with weight loss.  She has been trying to be compliant with a diabetic diet but is back on drinking sodas daily, drinking more water, she is going to the gym a couple of times a week.  Denies polyphagia, polydipsia or polyuria. Taking benazepril for microalbuminuria.   HTN: taking medication daily and denies side effects, she has not been checking her bp at home, no chest pain or palpitation, no dizziness. BP is towards low end of normal, we will decrease dose of Lotrel from 5/40 to 5/20  Hyperlipidemia: taking Atorvastatin, no muscle aches, she has been compliant since she started taking medication every morning.   Hypothyroidism: she is taking Synthroid daily , denies constipation she has dry skin. Last TSH was normal , had labs done yesterday but results are pending  Extreme Obesity: she continues to gain weight. Discussed trying Ozempic since Bydureon is not curbing her appetite, needs to change diet and stop sodas again  Abnormal Pth and vitamin D: seen by Dr. Gabriel Carina, she thinks secondary to low vitamin D, she is on higher supplementation. Getting checked for celiac disease.   B12 deficiency: daughter is a travel Marine scientist, she has not had b12 injection in the past 3 months, brought her b12 with her today and we will give it to her in our office.    Patient Active Problem List   Diagnosis Date Noted  . Cervical high risk HPV (human papillomavirus) test positive 09/03/2016  . B12 deficiency 09/04/2015  . Elevated PTHrP level ([place]) 09/04/2015  . Benign hypertension 09/12/2014  . Acanthosis nigricans 09/09/2014  . Diabetes  mellitus with renal manifestation ([place]) 09/09/2014  . Dyslipidemia 09/09/2014  . Abnormal electrocardiogram 09/09/2014  . Enlarged kidney 09/09/2014  . 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[place]) 09/09/2014  . Enlarged liver 09/09/2014  . Calcium blood increased 09/09/2014  . Adult hypothyroidism 09/09/2014  . Microalbuminuria 09/09/2014  . Extreme obesity 09/09/2014  . Spinal stenosis of lumbar region with neurogenic claudication 09/09/2014  . Osteopenia 09/09/2014  . Vitamin D deficiency 09/09/2014    Past Surgical History:  Procedure Laterality Date  . ABDOMINAL HYSTERECTOMY  2005  . blood transfusion yearly    . BREAST REDUCTION SURGERY    . CESAREAN SECTION    . CESAREAN SECTION  1992  . CHOLECYSTECTOMY  2000  . lapcholecystectomy    . REDUCTION MAMMAPLASTY Bilateral 1985  . VESICOVAGINAL FISTULA CLOSURE W/ TAH      Family History  Problem Relation Age of Onset  . Colon polyps Mother   . Hemachromatosis Mother   . Hypertension Mother   . Depression Sister   . Hypertension Brother   . Cancer Father   . Colon polyps Brother   . Breast cancer Cousin   . Breast cancer Cousin     Social History   Social History  . Marital status: Married    Spouse name: N/A  . Number of children: N/A  . Years of education: N/A   Occupational History  . Not on file.   Social History Main Topics  . Smoking status: Former Smoker  Types: Cigarettes    Quit date: 01/19/1990  . Smokeless tobacco: Never Used  . Alcohol use 0.0 oz/week     Comment: occasionally  . Drug use: No  . Sexual activity: Yes    Partners: Male   Other Topics Concern  . Not on file   Social History Narrative   Married; full time   Her daughter, [name] is a travel Marine scientist      Current Outpatient Prescriptions:  .  aspirin 81 MG EC tablet, Take 81 mg by mouth daily.  , Disp: , Rfl:  .  atorvastatin (LIPITOR) 40 MG tablet, TAKE 1 TABLET BY MOUTH  DAILY, Disp: 90 tablet, Rfl: 2 .  cholecalciferol (VITAMIN D) 1000  units tablet, Take 1,000 Units by mouth daily., Disp: , Rfl:  .  fluticasone (FLONASE) 50 MCG/ACT nasal spray, Place 2 sprays into both nostrils daily., Disp: 48 g, Rfl: 0 .  levothyroxine (SYNTHROID, LEVOTHROID) 75 MCG tablet, TAKE 1 TABLET BY MOUTH  DAILY, Disp: 90 tablet, Rfl: 1 .  ONE TOUCH ULTRA TEST test strip, Check fsbs twice week E11.29, Disp: 100 each, Rfl: 2 .  Vitamin D, Ergocalciferol, (DRISDOL) 50000 units CAPS capsule, Take 1 capsule by mouth 2 (two) times a week., Disp: , Rfl:  .  amLODipine-benazepril (LOTREL) 5-20 MG capsule, Take 1 capsule by mouth daily., Disp: 90 capsule, Rfl: 1 .  Semaglutide (OZEMPIC) 0.25 or 0.5 MG/DOSE SOPN, Inject 0.5 mg into the skin once a week., Disp: 2 pen, Rfl: 0  No Known Allergies   ROS  Constitutional: Negative for fever or weight change.  Respiratory: Negative for cough and shortness of breath.   Cardiovascular: Negative for chest pain or palpitations.  Gastrointestinal: Negative for abdominal pain, no bowel changes.  Musculoskeletal: Negative for gait problem or joint swelling.  Skin: Negative for rash.  Neurological: Negative for dizziness or headache.  No other specific complaints in a complete review of systems (except as listed in HPI above).  Objective  Vitals:   10/13/16 1556  BP: 100/80  Pulse: 81  SpO2: 99%  Weight: 262 lb 14.4 oz (119.3 kg)    Body mass index is 41.18 kg/m.  Physical Exam  Constitutional: Patient appears well-developed and well-nourished. Obese  No distress.  HEENT: head atraumatic, normocephalic, pupils equal and reactive to light,  neck supple, throat within normal limits Cardiovascular: Normal rate, regular rhythm and normal heart sounds.  No murmur heard. Trace BLE edema. Pulmonary/Chest: Effort normal and breath sounds normal. No respiratory distress. Abdominal: Soft.  There is no tenderness. Skin: had skin tags cryo by Dr. Phillip Heal and is worried about lesions, explained normal to form a  blister Psychiatric: Patient has a normal mood and affect. behavior is normal. Judgment and thought content normal.  Recent Results (from the past 2160 hour(s))  Pap IG and HPV (high risk) DNA detection     Status: Abnormal   Collection Time: 08/28/16 12:00 AM  Result Value Ref Range   DIAGNOSIS: Comment     Comment: NEGATIVE FOR INTRAEPITHELIAL LESION AND MALIGNANCY.   Specimen adequacy: Comment     Comment: Satisfactory for evaluation. Endocervical and/or squamous metaplastic cells (endocervical component) are present.    Clinician Provided ICD10 Comment     Comment: Z01.419 Z12.4    Performed by: Comment     Comment: Letta Median, Cytotechnologist (ASCP)   PAP Smear Comment .    Note: Comment     Comment: The Pap smear is a screening test designed to  aid in the detection of premalignant and malignant conditions of the uterine cervix.  It is not a diagnostic procedure and should not be used as the sole means of detecting cervical cancer.  Both false-positive and false-negative reports do occur.    Test Methodology Comment     Comment: This liquid based ThinPrep(R) pap test was screened with the use of an image guided system.    HPV, high-risk Positive (A) Negative    Comment: This high-risk HPV test detects thirteen high-risk types (16/18/31/33/35/39/45/51/52/56/58/59/68) without differentiation.   Please Note     Status: None   Collection Time: 08/28/16 12:00 AM  Result Value Ref Range   Please Note: Comment     Comment: The date and/or time of collection was not indicated on the requisition as required by state and federal law.  The date of receipt of the specimen was used as the collection date if not supplied.   Comprehensive metabolic panel     Status: Abnormal   Collection Time: 09/07/16  8:08 AM  Result Value Ref Range   Glucose 133 (H) 65 - 99 mg/dL   BUN 9 6 - 24 mg/dL   Creatinine, Ser 0.63 0.57 - 1.00 mg/dL   GFR calc non Af Amer 102 >59 mL/min/1.73    GFR calc Af Amer 118 >59 mL/min/1.73   BUN/Creatinine Ratio 14 9 - 23   Sodium 141 134 - 144 mmol/L   Potassium 4.7 3.5 - 5.2 mmol/L   Chloride 102 96 - 106 mmol/L   CO2 25 20 - 29 mmol/L   Calcium 10.6 (H) 8.7 - 10.2 mg/dL   Total Protein 7.3 6.0 - 8.5 g/dL   Albumin 4.6 3.5 - 5.5 g/dL   Globulin, Total 2.7 1.5 - 4.5 g/dL   Albumin/Globulin Ratio 1.7 1.2 - 2.2   Bilirubin Total 0.8 0.0 - 1.2 mg/dL   Alkaline Phosphatase 108 39 - 117 IU/L   AST 36 0 - 40 IU/L   ALT 42 (H) 0 - 32 IU/L  Lipid Panel w/o Chol/HDL Ratio     Status: Abnormal   Collection Time: 09/07/16  8:08 AM  Result Value Ref Range   Cholesterol, Total 178 100 - 199 mg/dL   Triglycerides 135 0 - 149 mg/dL   HDL 42 >39 mg/dL   VLDL Cholesterol Cal 27 5 - 40 mg/dL   LDL Calculated 109 (H) 0 - 99 mg/dL  Hgb A1c w/o eAG     Status: Abnormal   Collection Time: 09/07/16  8:08 AM  Result Value Ref Range   Hgb A1c MFr Bld 6.3 (H) 4.8 - 5.6 %    Comment:          Prediabetes: 5.7 - 6.4          Diabetes: >6.4          Glycemic control for adults with diabetes: <7.0   Iron, TIBC and Ferritin Panel     Status: None (Preliminary result)   Collection Time: 10/12/16 10:38 AM  Result Value Ref Range   Total Iron Binding Capacity WILL FOLLOW    UIBC WILL FOLLOW    Iron WILL FOLLOW    Iron Saturation WILL FOLLOW    Ferritin WILL FOLLOW   CBC with Differential/Platelet     Status: Abnormal   Collection Time: 10/12/16 10:38 AM  Result Value Ref Range   WBC 6.6 3.4 - 10.8 x10E3/uL   RBC 4.29 3.77 - 5.28 x10E6/uL   Hemoglobin 15.8 11.1 -  15.9 g/dL   Hematocrit 44.7 34.0 - 46.6 %   MCV 104 (H) 79 - 97 fL   MCH 36.8 (H) 26.6 - 33.0 pg   MCHC 35.3 31.5 - 35.7 g/dL   RDW 12.8 12.3 - 15.4 %   Platelets 210 150 - 379 x10E3/uL   Neutrophils 47 Not Estab. %   Lymphs 45 Not Estab. %   Monocytes 6 Not Estab. %   Eos 1 Not Estab. %   Basos 1 Not Estab. %   Neutrophils Absolute 3.1 1.4 - 7.0 x10E3/uL   Lymphocytes Absolute  3.0 0.7 - 3.1 x10E3/uL   Monocytes Absolute 0.4 0.1 - 0.9 x10E3/uL   EOS (ABSOLUTE) 0.1 0.0 - 0.4 x10E3/uL   Basophils Absolute 0.0 0.0 - 0.2 x10E3/uL   Immature Granulocytes 0 Not Estab. %   Immature Grans (Abs) 0.0 0.0 - 0.1 x10E3/uL  Comp. Metabolic Panel (12)     Status: None (Preliminary result)   Collection Time: 10/12/16 10:38 AM  Result Value Ref Range   Glucose WILL FOLLOW    BUN WILL FOLLOW    Creatinine, Ser WILL FOLLOW    GFR calc non Af Amer WILL FOLLOW    GFR calc Af Amer WILL FOLLOW    BUN/Creatinine Ratio WILL FOLLOW    Sodium WILL FOLLOW    Potassium WILL FOLLOW    Chloride WILL FOLLOW    Calcium WILL FOLLOW    Total Protein WILL FOLLOW    Albumin WILL FOLLOW    Globulin, Total WILL FOLLOW    Albumin/Globulin Ratio WILL FOLLOW    Bilirubin Total WILL FOLLOW    Alkaline Phosphatase WILL FOLLOW    AST WILL FOLLOW   Lipid panel     Status: None (Preliminary result)   Collection Time: 10/12/16 10:38 AM  Result Value Ref Range   Cholesterol, Total WILL FOLLOW    Triglycerides WILL FOLLOW    HDL WILL FOLLOW    VLDL Cholesterol Cal WILL FOLLOW    LDL Calculated WILL FOLLOW    Comment: WILL FOLLOW    Chol/HDL Ratio WILL FOLLOW   Thyroid Panel With TSH     Status: None (Preliminary result)   Collection Time: 10/12/16 10:38 AM  Result Value Ref Range   TSH WILL FOLLOW    T4, Total WILL FOLLOW    T3 Uptake Ratio WILL FOLLOW    Free Thyroxine Index WILL FOLLOW   PTH, intact and calcium     Status: Abnormal   Collection Time: 10/12/16 10:38 AM  Result Value Ref Range   PTH 96 (H) 15 - 65 pg/mL   PTH Interp Comment     Comment: Interpretation                 Intact PTH    Calcium                                 (pg/mL)      (mg/dL) Normal                          15 - 65     8.6 - 10.2 Primary Hyperparathyroidism         >65          >10.2 Secondary Hyperparathyroidism       >65          <10.2 Non-Parathyroid Hypercalcemia       <  65           >10.2 Hypoparathyroidism                  <15          < 8.6 Non-Parathyroid Hypocalcemia    15 - 65          < 8.6   Hemoglobin A1c     Status: Abnormal   Collection Time: 10/12/16 10:38 AM  Result Value Ref Range   Hgb A1c MFr Bld 6.4 (H) 4.8 - 5.6 %    Comment:          Prediabetes: 5.7 - 6.4          Diabetes: >6.4          Glycemic control for adults with diabetes: <7.0    Est. average glucose Bld gHb Est-mCnc 137 mg/dL  VITAMIN D 25 Hydroxy (Vit-D Deficiency, Fractures)     Status: None (Preliminary result)   Collection Time: 10/12/16 10:38 AM  Result Value Ref Range   Vit D, 25-Hydroxy WILL FOLLOW   Vitamin B12     Status: None (Preliminary result)   Collection Time: 10/12/16 10:38 AM  Result Value Ref Range   Vitamin B-12 WILL FOLLOW      PHQ2/9: Depression screen [facility] 2/9 08/28/2016 04/10/2016 09/06/2015 06/24/2015 04/18/2015  Decreased Interest 0 0 0 0 0  Down, Depressed, Hopeless 0 0 0 0 0  PHQ - 2 Score 0 0 0 0 0     Fall Risk: Fall Risk  08/28/2016 04/10/2016 09/06/2015 06/24/2015 04/18/2015  Falls in the past year? No No No No No      Assessment & Plan  1. Adult hypothyroidism  Pending TSH results  2. Benign hypertension  - amLODipine-benazepril (LOTREL) 5-20 MG capsule; Take 1 capsule by mouth daily.  Dispense: 90 capsule; Refill: 1  3. Elevated PTHrP level ([place])  Keep follow up with Dr. Gabriel Carina   4. Type 2 diabetes mellitus with microalbuminuria, with long-term current use of insulin (HCC)  - Semaglutide (OZEMPIC) 0.25 or 0.5 MG/DOSE SOPN; Inject 0.5 mg into the skin once a week.  Dispense: 2 pen; Refill: 0  5. Morbid obesity due to excess calories [facility])  Discussed with the patient the risk posed by an increased BMI. Discussed importance of portion control, calorie counting and at least 150 minutes of physical activity weekly. Avoid sweet beverages and drink more water. Eat at least 6 servings of fruit and vegetables daily   6. Vitamin D  deficiency  Continue supplementation   7. Dyslipidemia  Continue medication

## 2016-10-17 LAB — COMP. METABOLIC PANEL (12)
ALK PHOS: 103 IU/L (ref 39–117)
AST: 43 IU/L — AB (ref 0–40)
Albumin/Globulin Ratio: 1.6 (ref 1.2–2.2)
Albumin: 4.5 g/dL (ref 3.5–5.5)
BUN/Creatinine Ratio: 15 (ref 9–23)
BUN: 10 mg/dL (ref 6–24)
Bilirubin Total: 0.6 mg/dL (ref 0.0–1.2)
CALCIUM: 10.2 mg/dL (ref 8.7–10.2)
CREATININE: 0.67 mg/dL (ref 0.57–1.00)
Chloride: 102 mmol/L (ref 96–106)
GFR calc Af Amer: 115 mL/min/{1.73_m2} (ref >59)
GFR, EST NON AFRICAN AMERICAN: 100 mL/min/{1.73_m2} (ref >59)
GLUCOSE: 112 mg/dL — AB (ref 65–99)
Globulin, Total: 2.9 g/dL (ref 1.5–4.5)
Potassium: 4.5 mmol/L (ref 3.5–5.2)
Sodium: 141 mmol/L (ref 134–144)
Total Protein: 7.4 g/dL (ref 6.0–8.5)

## 2016-10-17 LAB — PTH, INTACT AND CALCIUM: PTH: 96 pg/mL — ABNORMAL HIGH (ref 15–65)

## 2016-10-17 LAB — IRON,TIBC AND FERRITIN PANEL
FERRITIN: 152 ng/mL — AB (ref 15–150)
IRON SATURATION: 56 % — AB (ref 15–55)
Iron: 148 ug/dL (ref 27–159)
TIBC: 266 ug/dL (ref 250–450)
UIBC: 118 ug/dL — ABNORMAL LOW (ref 131–425)

## 2016-10-17 LAB — LIPID PANEL
CHOL/HDL RATIO: 4.1 ratio (ref 0.0–4.4)
Cholesterol, Total: 182 mg/dL (ref 100–199)
HDL: 44 mg/dL (ref >39)
LDL CALC: 108 mg/dL — AB (ref 0–99)
TRIGLYCERIDES: 149 mg/dL (ref 0–149)
VLDL CHOLESTEROL CAL: 30 mg/dL (ref 5–40)

## 2016-10-17 LAB — CBC WITH DIFFERENTIAL/PLATELET
BASOS ABS: 0 10*3/uL (ref 0.0–0.2)
Basos: 1 %
EOS (ABSOLUTE): 0.1 10*3/uL (ref 0.0–0.4)
Eos: 1 %
Hematocrit: 44.7 % (ref 34.0–46.6)
Hemoglobin: 15.8 g/dL (ref 11.1–15.9)
Immature Grans (Abs): 0 10*3/uL (ref 0.0–0.1)
Immature Granulocytes: 0 %
LYMPHS ABS: 3 10*3/uL (ref 0.7–3.1)
LYMPHS: 45 %
MCH: 36.8 pg — AB (ref 26.6–33.0)
MCHC: 35.3 g/dL (ref 31.5–35.7)
MCV: 104 fL — ABNORMAL HIGH (ref 79–97)
Monocytes Absolute: 0.4 10*3/uL (ref 0.1–0.9)
Monocytes: 6 %
NEUTROS ABS: 3.1 10*3/uL (ref 1.4–7.0)
Neutrophils: 47 %
PLATELETS: 210 10*3/uL (ref 150–379)
RBC: 4.29 x10E6/uL (ref 3.77–5.28)
RDW: 12.8 % (ref 12.3–15.4)
WBC: 6.6 10*3/uL (ref 3.4–10.8)

## 2016-10-17 LAB — HEMOGLOBIN A1C
ESTIMATED AVERAGE GLUCOSE: 137 mg/dL
HEMOGLOBIN A1C: 6.4 % — AB (ref 4.8–5.6)

## 2016-10-17 LAB — VITAMIN B12: Vitamin B-12: 337 pg/mL (ref 232–1245)

## 2016-10-17 LAB — THYROID PANEL WITH TSH
Free Thyroxine Index: 1.4 (ref 1.2–4.9)
T3 UPTAKE RATIO: 20 % — AB (ref 24–39)
T4, Total: 7.1 ug/dL (ref 4.5–12.0)
TSH: 1.92 u[IU]/mL (ref 0.450–4.500)

## 2016-10-17 LAB — VITAMIN D 25 HYDROXY (VIT D DEFICIENCY, FRACTURES): VIT D 25 HYDROXY: 32.9 ng/mL (ref 30.0–100.0)

## 2016-12-10 ENCOUNTER — Other Ambulatory Visit: Payer: Self-pay | Admitting: Family Medicine

## 2016-12-10 DIAGNOSIS — E038 Other specified hypothyroidism: Secondary | ICD-10-CM

## 2016-12-15 ENCOUNTER — Other Ambulatory Visit: Payer: Self-pay | Admitting: Family Medicine

## 2016-12-15 DIAGNOSIS — E1121 Type 2 diabetes mellitus with diabetic nephropathy: Secondary | ICD-10-CM

## 2016-12-20 ENCOUNTER — Other Ambulatory Visit: Payer: Self-pay | Admitting: Family Medicine

## 2016-12-20 DIAGNOSIS — E785 Hyperlipidemia, unspecified: Secondary | ICD-10-CM

## 2016-12-21 ENCOUNTER — Ambulatory Visit (INDEPENDENT_AMBULATORY_CARE_PROVIDER_SITE_OTHER): Payer: 59

## 2016-12-21 DIAGNOSIS — E538 Deficiency of other specified B group vitamins: Secondary | ICD-10-CM | POA: Diagnosis not present

## 2016-12-21 MED ORDER — CYANOCOBALAMIN 1000 MCG/ML IJ SOLN
1000.0000 ug | Freq: Once | INTRAMUSCULAR | Status: AC
  Administered 2016-12-21: 1000 ug via INTRAMUSCULAR

## 2016-12-21 NOTE — Progress Notes (Signed)
B-12 injection given patient tolerated well. NKDA to medication.

## 2016-12-29 ENCOUNTER — Ambulatory Visit: Payer: 59 | Admitting: Family Medicine

## 2017-01-03 ENCOUNTER — Other Ambulatory Visit: Payer: Self-pay | Admitting: Family Medicine

## 2017-01-03 DIAGNOSIS — Z794 Long term (current) use of insulin: Principal | ICD-10-CM

## 2017-01-03 DIAGNOSIS — E1129 Type 2 diabetes mellitus with other diabetic kidney complication: Secondary | ICD-10-CM

## 2017-01-03 DIAGNOSIS — R809 Proteinuria, unspecified: Principal | ICD-10-CM

## 2017-01-04 MED ORDER — SEMAGLUTIDE(0.25 OR 0.5MG/DOS) 2 MG/1.5ML ~~LOC~~ SOPN: 0.5000 mg | PEN_INJECTOR | SUBCUTANEOUS | 1 refills | Status: DC

## 2017-01-04 NOTE — Telephone Encounter (Signed)
Refill request for general medication: Ozempic 0.25 or 0.54 mg  Last office visit: 10/13/2016  Last physical exam: 08/28/2016  Follow up visit: 08/31/2017

## 2017-01-06 ENCOUNTER — Other Ambulatory Visit: Payer: Self-pay

## 2017-01-06 DIAGNOSIS — Z794 Long term (current) use of insulin: Principal | ICD-10-CM

## 2017-01-06 DIAGNOSIS — R809 Proteinuria, unspecified: Principal | ICD-10-CM

## 2017-01-06 DIAGNOSIS — E1129 Type 2 diabetes mellitus with other diabetic kidney complication: Secondary | ICD-10-CM

## 2017-01-06 NOTE — Telephone Encounter (Signed)
Patient called and states [facility] is going to charge her $400 because she needs to use [facility] instead. Patient asked for samples but we did not have any. Asked Suanne Marker about calling the reps and they will be on vacation until the first of the year. Informed patient to call [facility] and ask about the expedite service they offer for patient's when they run out of medication.

## 2017-01-07 MED ORDER — SEMAGLUTIDE(0.25 OR 0.5MG/DOS) 2 MG/1.5ML ~~LOC~~ SOPN: 0.5000 mg | PEN_INJECTOR | SUBCUTANEOUS | 1 refills | Status: DC

## 2017-02-02 ENCOUNTER — Ambulatory Visit: Payer: 59 | Admitting: Family Medicine

## 2017-02-02 ENCOUNTER — Encounter: Payer: Self-pay | Admitting: Family Medicine

## 2017-02-02 VITALS — BP 118/82 | HR 95 | Temp 98.0°F | Resp 16 | Ht 67.0 in | Wt 261.5 lb

## 2017-02-02 DIAGNOSIS — E039 Hypothyroidism, unspecified: Secondary | ICD-10-CM

## 2017-02-02 DIAGNOSIS — E538 Deficiency of other specified B group vitamins: Secondary | ICD-10-CM | POA: Diagnosis not present

## 2017-02-02 DIAGNOSIS — R7989 Other specified abnormal findings of blood chemistry: Secondary | ICD-10-CM | POA: Diagnosis not present

## 2017-02-02 DIAGNOSIS — I10 Essential (primary) hypertension: Secondary | ICD-10-CM | POA: Diagnosis not present

## 2017-02-02 DIAGNOSIS — M48062 Spinal stenosis, lumbar region with neurogenic claudication: Secondary | ICD-10-CM | POA: Diagnosis not present

## 2017-02-02 DIAGNOSIS — E559 Vitamin D deficiency, unspecified: Secondary | ICD-10-CM

## 2017-02-02 DIAGNOSIS — Z794 Long term (current) use of insulin: Secondary | ICD-10-CM | POA: Diagnosis not present

## 2017-02-02 DIAGNOSIS — R809 Proteinuria, unspecified: Secondary | ICD-10-CM | POA: Diagnosis not present

## 2017-02-02 DIAGNOSIS — E1129 Type 2 diabetes mellitus with other diabetic kidney complication: Secondary | ICD-10-CM

## 2017-02-02 LAB — POCT GLYCOSYLATED HEMOGLOBIN (HGB A1C): HEMOGLOBIN A1C: 8.6

## 2017-02-02 MED ORDER — EXENATIDE ER 2 MG/0.85ML ~~LOC~~ AUIJ: 2.0000 mg | AUTO-INJECTOR | SUBCUTANEOUS | 1 refills | Status: DC

## 2017-02-02 MED ORDER — METFORMIN HCL ER 750 MG PO TB24: 750.0000 mg | ORAL_TABLET | Freq: Every day | ORAL | 0 refills | Status: DC

## 2017-02-02 MED ORDER — AMLODIPINE BESY-BENAZEPRIL HCL 5-20 MG PO CAPS: 1.0000 | ORAL_CAPSULE | Freq: Every day | ORAL | 1 refills | Status: DC

## 2017-02-02 NOTE — Progress Notes (Signed)
Name: Tabitha Shaw   MRN: [id number]    DOB: [birth year]   Date:02/02/2017       Progress Note  Subjective  Chief Complaint  Chief Complaint  Patient presents with  . Medication Refill  . Hypothyroidism    Hair has been thinning out and taking a vitamin for hair and nail to help as well  . Diabetes    Insurance would not cover Ozempic and has been w/o medication for a couple of weeks  . Hyperlipidemia  . Hypertension    Denies any symptoms  . B12 Deficiency    Daughter [name] is a Therapist, sports and been giving her mother her shots doing well.     HPI  DMII : she was using Bydureon and was tolerating well, glucose was between 104-120 fasting, last hgbA1C was 6.3%. However on her last visit 09/2016 we switched to Ozempic samples to help with weight loss. Insurance denied medication and she has been without medication for months, her hgbA1C is up to 8.6% today. She states glucose at home has been in the 150's fasting and 180's post-prandially, did not change diet significantly. She is afraid of changing medications. Discussed adding insulin, but she would like to go back to [facility], also willing to try adding Metformin even though it cause diarrhea in the past. Denies polyphagia, polydipsia or polyuria. Taking benazepril for microalbuminuria.   HTN: taking medication daily and denies side effects, she has not been checking her bp at home, no chest pain or palpitation, no dizziness. BP is normal today, continue Lotrel 5.20  Hyperlipidemia: taking Atorvastatin, no muscle aches, she has been compliant since she started taking medication every morning.   Hypothyroidism: she is taking Synthroid daily , denies constipation she has dry skin. Last TSH was normal , we will recheck labs today   Extreme Obesity: she lost a couple of pounds since last visit, not sure if because glucose is out of control, we will monitor.   Abnormal Pth and vitamin D: seen by Dr. Gabriel Carina, she thinks secondary to low  vitamin D, she is on higher supplementation. She has follow up with her in Feb   B12 deficiency: daughter is a travel Marine scientist, she had B12 this month at home    Patient Active Problem List   Diagnosis Date Noted  . Cervical high risk HPV (human papillomavirus) test positive 09/03/2016  . B12 deficiency 09/04/2015  . Elevated PTHrP level 09/04/2015  . Benign hypertension 09/12/2014  . Acanthosis nigricans 09/09/2014  . Diabetes mellitus with renal manifestation ([place]) 09/09/2014  . Dyslipidemia 09/09/2014  . Abnormal electrocardiogram 09/09/2014  . Enlarged kidney 09/09/2014  . 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[place]) 09/09/2014  . Enlarged liver 09/09/2014  . Calcium blood increased 09/09/2014  . Adult hypothyroidism 09/09/2014  . Microalbuminuria 09/09/2014  . Morbid obesity due to excess calories ([place]) 09/09/2014  . Spinal stenosis of lumbar region with neurogenic claudication 09/09/2014  . Osteopenia 09/09/2014  . Vitamin D deficiency 09/09/2014    Past Surgical History:  Procedure Laterality Date  . ABDOMINAL HYSTERECTOMY  2005  . blood transfusion yearly    . BREAST REDUCTION SURGERY    . CESAREAN SECTION    . CESAREAN SECTION  1992  . CHOLECYSTECTOMY  2000  . lapcholecystectomy    . REDUCTION MAMMAPLASTY Bilateral 1985  . VESICOVAGINAL FISTULA CLOSURE W/ TAH      Family History  Problem Relation Age of Onset  . Colon polyps Mother   . Hemachromatosis Mother   .  Hypertension Mother   . Depression Sister   . Hypertension Brother   . Cancer Father   . Colon polyps Brother   . Breast cancer Cousin   . Breast cancer Cousin     Social History   Socioeconomic History  . Marital status: Married    Spouse name: Not on file  . Number of children: Not on file  . Years of education: Not on file  . Highest education level: Not on file  Social Needs  . Financial resource strain: Not on file  . Food insecurity - worry: Not on file  . Food insecurity - inability: Not on file   . Transportation needs - medical: Not on file  . Transportation needs - non-medical: Not on file  Occupational History  . Not on file  Tobacco Use  . Smoking status: Former Smoker    Types: Cigarettes    Last attempt to quit: 01/19/1990    Years since quitting: 27.0  . Smokeless tobacco: Never Used  Substance and Sexual Activity  . Alcohol use: Yes    Alcohol/week: 0.0 oz    Comment: occasionally  . Drug use: No  . Sexual activity: Yes    Partners: Male  Other Topics Concern  . Not on file  Social History Narrative   Married; full time   Her daughter, [name] is a travel Marine scientist      Current Outpatient Medications:  .  amLODipine-benazepril (LOTREL) 5-20 MG capsule, Take 1 capsule by mouth daily., Disp: 90 capsule, Rfl: 1 .  aspirin 81 MG EC tablet, Take 81 mg by mouth daily.  , Disp: , Rfl:  .  atorvastatin (LIPITOR) 40 MG tablet, TAKE 1 TABLET BY MOUTH  DAILY, Disp: 90 tablet, Rfl: 1 .  cholecalciferol (VITAMIN D) 1000 units tablet, Take 1,000 Units by mouth daily., Disp: , Rfl:  .  fluticasone (FLONASE) 50 MCG/ACT nasal spray, Place 2 sprays into both nostrils daily., Disp: 48 g, Rfl: 0 .  levothyroxine (SYNTHROID, LEVOTHROID) 75 MCG tablet, TAKE 1 TABLET BY MOUTH  DAILY, Disp: 90 tablet, Rfl: 1 .  naproxen (NAPROSYN) 500 MG tablet, Take 500 mg by mouth 2 (two) times daily as needed., Disp: , Rfl: 1 .  ONE TOUCH ULTRA TEST test strip, Check fsbs twice week E11.29, Disp: 100 each, Rfl: 2 .  Vitamin D, Ergocalciferol, (DRISDOL) 50000 units CAPS capsule, Take 1 capsule by mouth every 30 (thirty) days. , Disp: , Rfl:  .  Exenatide ER (BYDUREON BCISE) 2 MG/0.85ML AUIJ, Inject 2 mg into the skin once a week., Disp: 12 pen, Rfl: 1 .  metFORMIN (GLUCOPHAGE XR) 750 MG 24 hr tablet, Take 1 tablet (750 mg total) by mouth daily with breakfast., Disp: 90 tablet, Rfl: 0  No Known Allergies   ROS  Constitutional: Negative for fever or weight change.  Respiratory: Negative for cough and  shortness of breath.   Cardiovascular: Negative for chest pain or palpitations.  Gastrointestinal: Negative for abdominal pain, no bowel changes.  Musculoskeletal: Negative for gait problem or joint swelling.  Skin: Negative for rash.  Neurological: Negative for dizziness or headache.  No other specific complaints in a complete review of systems (except as listed in HPI above).  Objective  Vitals:   02/02/17 0906  BP: 118/82  Pulse: 95  Resp: 16  Temp: 98 F (36.7 C)  TempSrc: Oral  SpO2: 96%  Weight: 261 lb 8 oz (118.6 kg)  Height: 5\' 7"  (1.702 m)    Body  mass index is 40.96 kg/m.  Physical Exam  Constitutional: Patient appears well-developed and well-nourished. Obese  No distress.  HEENT: head atraumatic, normocephalic, pupils equal and reactive to light, neck supple, throat within normal limits Cardiovascular: Normal rate, regular rhythm and normal heart sounds.  No murmur heard. No BLE edema. Pulmonary/Chest: Effort normal and breath sounds normal. No respiratory distress. Abdominal: Soft.  There is no tenderness. Psychiatric: Patient has a normal mood and affect. behavior is normal. Judgment and thought content normal.  Recent Results (from the past 2160 hour(s))  POCT HgB A1C     Status: Abnormal   Collection Time: 02/02/17  9:15 AM  Result Value Ref Range   Hemoglobin A1C 8.6     Diabetic Foot Exam: Diabetic Foot Exam - Simple   Simple Foot Form Diabetic Foot exam was performed with the following findings:  Yes 02/02/2017  9:34 AM  Visual Inspection See comments:  Yes Sensation Testing Intact to touch and monofilament testing bilaterally:  Yes Pulse Check Posterior Tibialis and Dorsalis pulse intact bilaterally:  Yes Comments Corn formation 5th toe right       PHQ2/9: Depression screen [facility] 2/9 02/02/2017 08/28/2016 04/10/2016 09/06/2015 06/24/2015  Decreased Interest 0 0 0 0 0  Down, Depressed, Hopeless 0 0 0 0 0  PHQ - 2 Score 0 0 0 0 0     Fall  Risk: Fall Risk  02/02/2017 08/28/2016 04/10/2016 09/06/2015 06/24/2015  Falls in the past year? No No No No No     Functional Status Survey: Is the patient deaf or have difficulty hearing?: No Does the patient have difficulty seeing, even when wearing glasses/contacts?: No Does the patient have difficulty concentrating, remembering, or making decisions?: No Does the patient have difficulty walking or climbing stairs?: No Does the patient have difficulty dressing or bathing?: No Does the patient have difficulty doing errands alone such as visiting a doctor's office or shopping?: No    Assessment & Plan  1. Type 2 diabetes mellitus with microalbuminuria, with long-term current use of insulin (HCC)  - POCT HgB A1C - Hemoglobin A1c - Exenatide ER (BYDUREON BCISE) 2 MG/0.85ML AUIJ; Inject 2 mg into the skin once a week.  Dispense: 12 pen; Refill: 1 - metFORMIN (GLUCOPHAGE XR) 750 MG 24 hr tablet; Take 1 tablet (750 mg total) by mouth daily with breakfast.  Dispense: 90 tablet; Refill: 0  2. B12 deficiency  - Vitamin B12  3. Benign hypertension  - Comprehensive metabolic panel - amLODipine-benazepril (LOTREL) 5-20 MG capsule; Take 1 capsule by mouth daily.  Dispense: 90 capsule; Refill: 1  4. Elevated PTHrP level  Keep follow up with Dr. Gabriel Carina next month   5. Morbid obesity due to excess calories [facility])  She lost a couple of pounds since last visit, we will monitor for now  6. 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[place])  Keep follow up with hematologist   7. Adult hypothyroidism  - TSH  8. Spinal stenosis of lumbar region with neurogenic claudication  Recent flare, went to Urgent Care, was given hydrocodone, Flexeril and Naproxen, feeling better, seeing Dr. Laveda Abbe again, off hydrocodone  9. Vitamin D deficiency  - VITAMIN D 25 Hydroxy (Vit-D Deficiency, Fractures)

## 2017-02-07 NOTE — Progress Notes (Signed)
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  INTERVAL HISTORY: Patient returns to clinic today for further evaluation and consideration of phlebotomy.  She continues to feel well and is asymptomatic.  She has no neurologic complaints.  She denies any recent fevers or illnesses.  She denies any chest pain or shortness of breath.  She denies any weakness or fatigue.  She has a good appetite and denies weight loss. She denies any nausea, vomiting, constipation, or diarrhea. She has no urinary complaints. Patient offers no specific complaints today.  REVIEW OF SYSTEMS:   Review of Systems  Constitutional: Negative.  Negative for fever, malaise/fatigue and weight loss.  Respiratory: Negative.  Negative for cough and shortness of breath.   Cardiovascular: Negative.  Negative for chest pain and leg swelling.  Gastrointestinal: Negative.  Negative for abdominal pain, blood in stool and melena.  Genitourinary: Negative.   Musculoskeletal: Negative.   Skin: Negative.  Negative for rash.  Neurological: Negative.  Negative for sensory change and weakness.  Psychiatric/Behavioral: Negative.  The patient is not nervous/anxious.     As per HPI. Otherwise, a complete review of systems is negative.  PAST MEDICAL HISTORY: Past Medical History:  Diagnosis Date  . Acquired acanthosis nigricans   . Diabetes type 2, controlled ([place])    if controlled was not specified  .  Hemochromatosis   . Hepatomegaly   . HLD (hyperlipidemia)   . HTN (hypertension)   . Hyperlipidemia   . Hypertension   . Hypertrophy of kidney   . Hypothyroidism   . Intertrigo   . Obesity   . Osteopenia   . Postinflammatory hyperpigmentation   . Snoring   . Thyroid disease   . Vitamin D deficiency     PAST SURGICAL HISTORY: Past Surgical History:  Procedure Laterality Date  . ABDOMINAL HYSTERECTOMY  2005  . blood transfusion yearly    . BREAST REDUCTION SURGERY    . CESAREAN SECTION    . CESAREAN SECTION  1992  . CHOLECYSTECTOMY  2000  . lapcholecystectomy    . REDUCTION MAMMAPLASTY Bilateral 1985  . VESICOVAGINAL FISTULA CLOSURE W/ TAH      FAMILY HISTORY Family History  Problem Relation Age of Onset  . Colon polyps Mother   . Hemachromatosis Mother   . Hypertension Mother   . Depression Sister   . Hypertension Brother   . Cancer Father   . Colon polyps Brother   . Breast cancer Cousin   . Breast cancer Cousin        ADVANCED DIRECTIVES:    HEALTH MAINTENANCE: Social History   Tobacco Use  . Smoking status: Former Smoker    Types: Cigarettes    Last attempt to quit: 01/19/1990    Years since quitting: 27.0  . Smokeless tobacco: Never Used  Substance Use Topics  . Alcohol use: Yes    Alcohol/week: 0.0 oz    Comment: occasionally  . Drug use: No     Colonoscopy:  PAP:  Bone density:  Lipid panel:  No Known Allergies  Current Outpatient Medications  Medication Sig Dispense Refill  . amLODipine-benazepril (LOTREL) 5-20 MG capsule Take 1 capsule by mouth daily. 90 capsule 1  . aspirin 81 MG EC tablet Take 81 mg by mouth daily.      Marland Kitchen atorvastatin (LIPITOR) 40 MG tablet TAKE 1 TABLET BY MOUTH  DAILY 90 tablet 1  . cholecalciferol (VITAMIN D) 1000 units tablet Take 1,000 Units by mouth daily.    . Exenatide ER (BYDUREON BCISE) 2 MG/0.85ML AUIJ Inject 2 mg into the skin once a week. 12 pen 1  . fluticasone (FLONASE) 50 MCG/ACT nasal spray Place 2  sprays into both nostrils daily. 48 g 0  . levothyroxine (SYNTHROID, LEVOTHROID) 75 MCG tablet TAKE 1 TABLET BY MOUTH  DAILY 90 tablet 1  . metFORMIN (GLUCOPHAGE XR) 750 MG 24 hr tablet Take 1 tablet (750 mg total) by mouth daily with breakfast. 90 tablet 0  . ONE TOUCH ULTRA TEST test strip Check fsbs twice week E11.29 100 each 2  . naproxen (NAPROSYN) 500 MG tablet Take 500 mg by mouth 2 (two) times daily as needed.  1  . Vitamin D, Ergocalciferol, (DRISDOL) 50000 units CAPS capsule Take 1 capsule by mouth every 30 (thirty) days.      No current facility-administered medications for this visit.     OBJECTIVE: Vitals:   02/08/17 1453  BP: (!) 132/92  Pulse: 84  Resp: 18  Temp: (!) 97.5 F (36.4 C)     Body mass index is 41.1 kg/m.    ECOG FS:0 - Asymptomatic  General: Well-developed, well-nourished, no acute distress. Eyes: anicteric sclera. Lungs: Clear to auscultation bilaterally. Heart: Regular rate and rhythm. No rubs, murmurs, or gallops. Abdomen: Soft, nontender, nondistended. No organomegaly noted, normoactive bowel sounds. Musculoskeletal: No edema, cyanosis, or clubbing. Neuro: Alert, answering all questions appropriately. Cranial nerves grossly intact. Skin: No rashes or petechiae noted. Psych: Normal affect.    LAB RESULTS:  Lab Results  Component Value Date   NA 141 10/12/2016   K 4.5 10/12/2016   CL 102 10/12/2016   CO2 25 09/07/2016   GLUCOSE 112 (H) 10/12/2016   BUN 10 10/12/2016   CREATININE 0.67 10/12/2016   CALCIUM 10.2 10/12/2016   PROT 7.4 10/12/2016   ALBUMIN 4.5 10/12/2016   AST 43 (H) 10/12/2016   ALT 42 (H) 09/07/2016   ALKPHOS 103 10/12/2016   BILITOT 0.6 10/12/2016   GFRNONAA 100 10/12/2016   GFRAA 115 10/12/2016    Lab Results  Component Value Date   WBC 6.6 10/12/2016   NEUTROABS 3.1 10/12/2016   HGB 15.8 10/12/2016   HCT 44.7 10/12/2016   MCV 104 (H) 10/12/2016   PLT 210 10/12/2016     STUDIES: No results  found.  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  PLAN:    1.  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 Hemoglobin is also mildly elevated. The remainder of her laboratory work continues to be within normal limits.  Proceed with 400 mL phlebotomy today.  Patient typically only requires phlebotomy approximately every 3 months, but her ferritin has trended up higher than usual, therefore she will return to clinic in 6 weeks for phlebotomy in 3 months for phlebotomy and further evaluation.  Patient continues to get all of her laboratory work at [facility].    Approximately 20 minutes spent in discussion of which greater than 50% was consultation.    Patient expressed understanding and was in agreement with  this plan. She also understands that She can call clinic at any time with any questions, concerns, or complaints.    Lloyd Huger, MD   02/08/2017 4:40 PM

## 2017-02-08 ENCOUNTER — Inpatient Hospital Stay: Payer: Managed Care, Other (non HMO)

## 2017-02-08 ENCOUNTER — Inpatient Hospital Stay: Payer: Managed Care, Other (non HMO) | Attending: Oncology | Admitting: Oncology

## 2017-02-18 ENCOUNTER — Encounter: Payer: Self-pay | Admitting: Oncology

## 2017-03-15 ENCOUNTER — Other Ambulatory Visit: Payer: Self-pay

## 2017-03-15 DIAGNOSIS — R809 Proteinuria, unspecified: Principal | ICD-10-CM

## 2017-03-15 DIAGNOSIS — Z794 Long term (current) use of insulin: Principal | ICD-10-CM

## 2017-03-15 DIAGNOSIS — E1129 Type 2 diabetes mellitus with other diabetic kidney complication: Secondary | ICD-10-CM

## 2017-03-15 MED ORDER — ONETOUCH ULTRA BLUE VI STRP: ORAL_STRIP | 2 refills | Status: DC

## 2017-03-15 NOTE — Telephone Encounter (Signed)
Refill request for diabetic medication:   One touch ultra blue test strips   Last office visit pertaining to diabetes: 02/02/2017  Lab Results  Component Value Date   HGBA1C 8.6 02/02/2017    Follow-up on file. 05/17/2017

## 2017-03-22 ENCOUNTER — Telehealth: Payer: Self-pay | Admitting: Oncology

## 2017-03-22 ENCOUNTER — Inpatient Hospital Stay: Payer: Managed Care, Other (non HMO)

## 2017-03-23 ENCOUNTER — Other Ambulatory Visit: Payer: Self-pay | Admitting: Family Medicine

## 2017-03-23 DIAGNOSIS — Z794 Long term (current) use of insulin: Principal | ICD-10-CM

## 2017-03-23 DIAGNOSIS — R809 Proteinuria, unspecified: Principal | ICD-10-CM

## 2017-03-23 DIAGNOSIS — E1129 Type 2 diabetes mellitus with other diabetic kidney complication: Secondary | ICD-10-CM

## 2017-03-23 NOTE — Telephone Encounter (Signed)
Refill request for diabetic medication:   Metformin 750 mg 24 hour tablet  Last office visit pertaining to diabetes: 02/02/2017  Lab Results  Component Value Date   HGBA1C 8.6 02/02/2017   Follow-up on file. 05/17/2017

## 2017-03-31 ENCOUNTER — Inpatient Hospital Stay: Payer: Managed Care, Other (non HMO) | Attending: Oncology

## 2017-04-05 ENCOUNTER — Ambulatory Visit (INDEPENDENT_AMBULATORY_CARE_PROVIDER_SITE_OTHER): Payer: Managed Care, Other (non HMO)

## 2017-04-05 DIAGNOSIS — E538 Deficiency of other specified B group vitamins: Secondary | ICD-10-CM

## 2017-04-05 MED ORDER — CYANOCOBALAMIN 1000 MCG/ML IJ SOLN
1000.0000 ug | Freq: Once | INTRAMUSCULAR | Status: AC
  Administered 2017-04-05: 1000 ug via INTRAMUSCULAR

## 2017-04-25 ENCOUNTER — Other Ambulatory Visit: Payer: Self-pay | Admitting: Family Medicine

## 2017-04-25 DIAGNOSIS — E785 Hyperlipidemia, unspecified: Secondary | ICD-10-CM

## 2017-04-25 DIAGNOSIS — E038 Other specified hypothyroidism: Secondary | ICD-10-CM

## 2017-04-26 ENCOUNTER — Other Ambulatory Visit: Payer: Self-pay

## 2017-04-26 DIAGNOSIS — Z794 Long term (current) use of insulin: Principal | ICD-10-CM

## 2017-04-26 DIAGNOSIS — R809 Proteinuria, unspecified: Principal | ICD-10-CM

## 2017-04-26 DIAGNOSIS — E1129 Type 2 diabetes mellitus with other diabetic kidney complication: Secondary | ICD-10-CM

## 2017-04-26 MED ORDER — ONETOUCH ULTRA BLUE VI STRP: ORAL_STRIP | 2 refills | Status: DC

## 2017-04-26 NOTE — Telephone Encounter (Signed)
Refill request for general medication: One Touch Ultra Blue  Last office visit: 02/02/2017  Follow up on 05/17/2017

## 2017-04-29 LAB — HM DIABETES EYE EXAM

## 2017-05-05 ENCOUNTER — Other Ambulatory Visit: Payer: Self-pay | Admitting: Oncology

## 2017-05-10 ENCOUNTER — Ambulatory Visit: Payer: Managed Care, Other (non HMO) | Admitting: Oncology

## 2017-05-14 LAB — COMPREHENSIVE METABOLIC PANEL
ALBUMIN: 4.3 g/dL (ref 3.5–5.5)
ALK PHOS: 118 IU/L — AB (ref 39–117)
ALT: 54 IU/L — AB (ref 0–32)
AST: 44 IU/L — AB (ref 0–40)
Albumin/Globulin Ratio: 1.5 (ref 1.2–2.2)
BUN/Creatinine Ratio: 15 (ref 9–23)
BUN: 11 mg/dL (ref 6–24)
Bilirubin Total: 0.7 mg/dL (ref 0.0–1.2)
CO2: 26 mmol/L (ref 20–29)
CREATININE: 0.71 mg/dL (ref 0.57–1.00)
Calcium: 10.6 mg/dL — ABNORMAL HIGH (ref 8.7–10.2)
Chloride: 103 mmol/L (ref 96–106)
GFR calc Af Amer: 112 mL/min/{1.73_m2} (ref >59)
GFR calc non Af Amer: 97 mL/min/{1.73_m2} (ref >59)
GLUCOSE: 156 mg/dL — AB (ref 65–99)
Globulin, Total: 2.8 g/dL (ref 1.5–4.5)
Potassium: 4.5 mmol/L (ref 3.5–5.2)
Sodium: 141 mmol/L (ref 134–144)
Total Protein: 7.1 g/dL (ref 6.0–8.5)

## 2017-05-14 LAB — VITAMIN B12: VITAMIN B 12: 365 pg/mL (ref 232–1245)

## 2017-05-14 LAB — TSH: TSH: 2.97 u[IU]/mL (ref 0.450–4.500)

## 2017-05-14 LAB — VITAMIN D 25 HYDROXY (VIT D DEFICIENCY, FRACTURES): Vit D, 25-Hydroxy: 27.7 ng/mL — ABNORMAL LOW (ref 30.0–100.0)

## 2017-05-14 LAB — HEMOGLOBIN A1C
ESTIMATED AVERAGE GLUCOSE: 148 mg/dL
Hgb A1c MFr Bld: 6.8 % — ABNORMAL HIGH (ref 4.8–5.6)

## 2017-05-16 NOTE — Progress Notes (Signed)
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  INTERVAL HISTORY: Patient returns to clinic today for routine six-month evaluation and consideration of additional phlebotomy.  She continues to feel well and remains asymptomatic. She has no neurologic complaints.  She denies any recent fevers or illnesses.  She denies any chest pain or shortness of breath.  She denies any weakness or fatigue.  She has a good appetite and denies weight loss. She denies any nausea, vomiting, constipation, or diarrhea. She has no urinary complaints.  Patient feels at her baseline offers no specific complaints today.  REVIEW OF SYSTEMS:   Review of Systems  Constitutional: Negative.  Negative for fever, malaise/fatigue and weight loss.  Respiratory: Negative.  Negative for cough and shortness of breath.   Cardiovascular: Negative.  Negative for chest pain and leg swelling.  Gastrointestinal: Negative.  Negative for abdominal pain, blood in stool and melena.  Genitourinary: Negative.  Negative for dysuria.  Musculoskeletal: Negative.  Negative for back pain.  Skin: Negative.  Negative for rash.  Neurological: Negative.  Negative for sensory change, focal weakness and weakness.  Psychiatric/Behavioral: Negative.  The patient is not nervous/anxious.     As per HPI. Otherwise, a complete review of systems is negative.  PAST MEDICAL HISTORY: Past Medical History:  Diagnosis Date  . Acquired acanthosis nigricans   . Diabetes type 2, controlled ([place])    if controlled was not specified  .  Hemochromatosis   . Hepatomegaly   . HLD (hyperlipidemia)   . HTN (hypertension)   . Hyperlipidemia   . Hypertension   . Hypertrophy of kidney   . Hypothyroidism   . Intertrigo   . Obesity   . Osteopenia   . Postinflammatory hyperpigmentation   . Snoring   . Thyroid disease   . Vitamin D deficiency     PAST SURGICAL HISTORY: Past Surgical History:  Procedure Laterality Date  . ABDOMINAL HYSTERECTOMY  2005  . blood transfusion yearly    . BREAST REDUCTION SURGERY    . CESAREAN SECTION    . CESAREAN SECTION  1992  . CHOLECYSTECTOMY  2000  . lapcholecystectomy    . REDUCTION MAMMAPLASTY Bilateral 1985  . VESICOVAGINAL FISTULA CLOSURE W/ TAH      FAMILY HISTORY Family History  Problem Relation Age of Onset  . Colon polyps Mother   . Hemachromatosis Mother   . Hypertension Mother   . Depression Sister   . Hypertension Brother   . Cancer Father   . Colon polyps Brother   . Breast cancer Cousin   . Breast cancer Cousin        ADVANCED DIRECTIVES:    HEALTH MAINTENANCE: Social History   Tobacco Use  . Smoking status: Former Smoker    Types: Cigarettes    Last attempt to quit: 01/19/1990    Years since quitting: 27.3  . Smokeless tobacco: Never Used  Substance Use Topics  . Alcohol use: Yes    Alcohol/week: 0.0 oz    Comment: occasionally  . Drug use: No     Colonoscopy:  PAP:  Bone density:  Lipid panel:  No Known Allergies  Current Outpatient  Medications  Medication Sig Dispense Refill  . amLODipine-benazepril (LOTREL) 5-20 MG capsule Take 1 capsule by mouth daily. 90 capsule 1  . aspirin 81 MG EC tablet Take 81 mg by mouth daily.      Marland Kitchen atorvastatin (LIPITOR) 40 MG tablet Take 1 tablet (40 mg total) by mouth daily. 90 tablet 1  . Exenatide ER (BYDUREON BCISE) 2 MG/0.85ML AUIJ Inject 2 mg into the skin once a week. 12 pen 1  . fluticasone (FLONASE) 50 MCG/ACT nasal spray Place 2 sprays into both nostrils daily. 48 g 0  . levothyroxine (SYNTHROID,  LEVOTHROID) 75 MCG tablet Take 1 tablet (75 mcg total) by mouth daily. 90 tablet 1  . metFORMIN (GLUCOPHAGE-XR) 750 MG 24 hr tablet Take 1 tablet (750 mg total) by mouth daily with breakfast. 90 tablet 1  . ONE TOUCH ULTRA TEST test strip Check fsbs twice week E11.29 100 each 2  . Vitamin D, Ergocalciferol, (DRISDOL) 50000 units CAPS capsule Take 1 capsule (50,000 Units total) by mouth every 30 (thirty) days. 12 capsule 0   No current facility-administered medications for this visit.     OBJECTIVE: Vitals:   05/17/17 1102  BP: (!) 143/98  Pulse: 88  Resp: 18  Temp: (!) 97.1 F (36.2 C)     Body mass index is 40.69 kg/m.    ECOG FS:0 - Asymptomatic  General: Well-developed, well-nourished, no acute distress. Eyes: Pink conjunctiva, anicteric sclera. Lungs: Clear to auscultation bilaterally. Heart: Regular rate and rhythm. No rubs, murmurs, or gallops. Abdomen: Soft, nontender, nondistended. No organomegaly noted, normoactive bowel sounds. Musculoskeletal: No edema, cyanosis, or clubbing. Neuro: Alert, answering all questions appropriately. Cranial nerves grossly intact. Skin: No rashes or petechiae noted. Psych: Normal affect.   LAB RESULTS:  Lab Results  Component Value Date   NA 141 05/13/2017   K 4.5 05/13/2017   CL 103 05/13/2017   CO2 26 05/13/2017   GLUCOSE 156 (H) 05/13/2017   BUN 11 05/13/2017   CREATININE 0.71 05/13/2017   CALCIUM 10.6 (H) 05/13/2017   PROT 7.1 05/13/2017   ALBUMIN 4.3 05/13/2017   AST 44 (H) 05/13/2017   ALT 54 (H) 05/13/2017   ALKPHOS 118 (H) 05/13/2017   BILITOT 0.7 05/13/2017   GFRNONAA 97 05/13/2017   GFRAA 112 05/13/2017    Lab Results  Component Value Date   WBC 6.6 10/12/2016   NEUTROABS 3.1 10/12/2016   HGB 15.8 10/12/2016   HCT 44.7 10/12/2016   MCV 104 (H) 10/12/2016   PLT 210 10/12/2016     STUDIES: No results found.  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  PLAN:    1.  Hemochromatosis,  homozygous for C282Y mutation: Patient most recent ferritin is 99 which is at her goal of 50-100.  Hemoglobin is within normal limits.  The remainder of her laboratory work continues to be within normal limits.  Proceed with 400 mL phlebotomy today with One Blood.  We will continue routine phlebotomy every 3 months.  Return to clinic in 3 months for laboratory work and phlebotomy and then in 6 months for further evaluation and continuation of phlebotomy. Patient continues to get all of her laboratory work at [facility].    Approximately 20 minutes was spent in discussion of which greater than 50% was consultation.  Patient expressed understanding and was in agreement with this plan. She also understands that She can call clinic at any time with any questions, concerns, or complaints.    Lloyd Huger, MD  05/18/2017 10:52 PM

## 2017-05-17 ENCOUNTER — Other Ambulatory Visit: Payer: Self-pay

## 2017-05-17 ENCOUNTER — Inpatient Hospital Stay: Payer: Managed Care, Other (non HMO)

## 2017-05-17 ENCOUNTER — Encounter: Payer: Self-pay | Admitting: Family Medicine

## 2017-05-17 ENCOUNTER — Ambulatory Visit: Payer: Managed Care, Other (non HMO) | Admitting: Family Medicine

## 2017-05-17 ENCOUNTER — Encounter: Payer: Self-pay | Admitting: Oncology

## 2017-05-17 ENCOUNTER — Inpatient Hospital Stay: Payer: Managed Care, Other (non HMO) | Attending: Oncology | Admitting: Oncology

## 2017-05-17 VITALS — BP 124/80 | HR 93 | Resp 16 | Ht 67.0 in | Wt 259.5 lb

## 2017-05-17 DIAGNOSIS — I10 Essential (primary) hypertension: Secondary | ICD-10-CM

## 2017-05-17 DIAGNOSIS — E211 Secondary hyperparathyroidism, not elsewhere classified: Secondary | ICD-10-CM

## 2017-05-17 DIAGNOSIS — E559 Vitamin D deficiency, unspecified: Secondary | ICD-10-CM | POA: Diagnosis not present

## 2017-05-17 DIAGNOSIS — E039 Hypothyroidism, unspecified: Secondary | ICD-10-CM

## 2017-05-17 DIAGNOSIS — M5416 Radiculopathy, lumbar region: Secondary | ICD-10-CM

## 2017-05-17 DIAGNOSIS — E538 Deficiency of other specified B group vitamins: Secondary | ICD-10-CM | POA: Diagnosis not present

## 2017-05-17 DIAGNOSIS — Z7982 Long term (current) use of aspirin: Secondary | ICD-10-CM | POA: Diagnosis not present

## 2017-05-17 DIAGNOSIS — R809 Proteinuria, unspecified: Secondary | ICD-10-CM

## 2017-05-17 DIAGNOSIS — Z794 Long term (current) use of insulin: Secondary | ICD-10-CM | POA: Diagnosis not present

## 2017-05-17 DIAGNOSIS — E785 Hyperlipidemia, unspecified: Secondary | ICD-10-CM

## 2017-05-17 DIAGNOSIS — M8580 Other specified disorders of bone density and structure, unspecified site: Secondary | ICD-10-CM | POA: Insufficient documentation

## 2017-05-17 DIAGNOSIS — E1129 Type 2 diabetes mellitus with other diabetic kidney complication: Secondary | ICD-10-CM

## 2017-05-17 DIAGNOSIS — Z79899 Other long term (current) drug therapy: Secondary | ICD-10-CM | POA: Insufficient documentation

## 2017-05-17 DIAGNOSIS — Z87891 Personal history of nicotine dependence: Secondary | ICD-10-CM | POA: Insufficient documentation

## 2017-05-17 MED ORDER — AMLODIPINE BESY-BENAZEPRIL HCL 5-20 MG PO CAPS: 1.0000 | ORAL_CAPSULE | Freq: Every day | ORAL | 1 refills | Status: DC

## 2017-05-17 MED ORDER — ONETOUCH ULTRA BLUE VI STRP: ORAL_STRIP | 2 refills | Status: DC

## 2017-05-17 MED ORDER — CYANOCOBALAMIN 1000 MCG/ML IJ SOLN
1000.0000 ug | Freq: Once | INTRAMUSCULAR | Status: AC
  Administered 2017-05-17: 1000 ug via INTRAMUSCULAR

## 2017-05-17 MED ORDER — METFORMIN HCL ER 750 MG PO TB24: 750.0000 mg | ORAL_TABLET | Freq: Every day | ORAL | 1 refills | Status: DC

## 2017-05-17 MED ORDER — ATORVASTATIN CALCIUM 40 MG PO TABS: 40.0000 mg | ORAL_TABLET | Freq: Every day | ORAL | 1 refills | Status: DC

## 2017-05-17 MED ORDER — VITAMIN D (ERGOCALCIFEROL) 1.25 MG (50000 UNIT) PO CAPS: 50000.0000 [IU] | ORAL_CAPSULE | ORAL | 0 refills | Status: DC

## 2017-05-17 MED ORDER — EXENATIDE ER 2 MG/0.85ML ~~LOC~~ AUIJ: 2.0000 mg | AUTO-INJECTOR | SUBCUTANEOUS | 1 refills | Status: DC

## 2017-05-17 MED ORDER — LEVOTHYROXINE SODIUM 75 MCG PO TABS: 75.0000 ug | ORAL_TABLET | Freq: Every day | ORAL | 1 refills | Status: DC

## 2017-05-17 NOTE — Progress Notes (Signed)
Name: Tabitha Shaw   MRN: [id number]    DOB: 1960/04/04   Date:05/17/2017       Progress Note  Subjective  Chief Complaint  Chief Complaint  Patient presents with  . Diabetes  . Hyperlipidemia  . Obesity    HPI  DMII : she was using Bydureon and was tolerating well, glucose was between 104-148fasting, last hgbA1C was 6.3%. However on 09/2016 we switched to Ozempic samples to help with weight loss. Insurance denied medication and she was  without medication for months, her hgbA1C went  up to 8.6%  She states glucose at home has been in the 120's fasting and 150's post-prandially, she has also changed diet and joined a gym, taking Zumba classes and cardio dance three times a week at [employer]..Denies polyphagia, polydipsia or polyuria, but she has nocturia once per night. Taking benazepril for microalbuminuria.   HTN: taking medication daily and denies side effects, she has not been checking her bp at home, no chest pain or palpitation, no dizziness.Continue Lotrel 5.20  Hyperlipidemia: taking Atorvastatin, no muscle aches,she has been compliant since she started taking medication every morning, reviewed last labs and greatly improved from previous one  Hypothyroidism: she is taking Synthroid 106mcg  daily , denies constipation she has dry skin. TSH at goal , continue medications  Morbid obesity: : she lost a couple of pounds since last visit, continue life style modification   Secondary hyperparathyroidism and vitamin D: seen by Dr. Gabriel Carina, she thinks secondary to low vitamin D, she is on higher supplementation. She has follow up every 3 months   B12 deficiency: daughter is a travel Marine scientist, she had B12 this month at home   Low back pain: seen at Urgent Care 01/2017 for low back pain on right side that was radiating to right lower leg, she was given naproxen and saw her chiropractor. She is feeling better but still has some pain when she stands up and radiates to right  groin, throbbing like, intermittent, but still able to able to be active  Hemachromatosis: still getting phlebotomy every 3 months.   Patient Active Problem List   Diagnosis Date Noted  . Cervical high risk HPV (human papillomavirus) test positive 09/03/2016  . B12 deficiency 09/04/2015  . Elevated PTHrP level 09/04/2015  . Benign hypertension 09/12/2014  . Acanthosis nigricans 09/09/2014  . Diabetes mellitus with renal manifestation ([place]) 09/09/2014  . Dyslipidemia 09/09/2014  . Abnormal electrocardiogram 09/09/2014  . Enlarged kidney 09/09/2014  . 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[place]) 09/09/2014  . Enlarged liver 09/09/2014  . Calcium blood increased 09/09/2014  . Adult hypothyroidism 09/09/2014  . Microalbuminuria 09/09/2014  . Morbid obesity due to excess calories ([place]) 09/09/2014  . Spinal stenosis of lumbar region with neurogenic claudication 09/09/2014  . Osteopenia 09/09/2014  . Vitamin D deficiency 09/09/2014    Past Surgical History:  Procedure Laterality Date  . ABDOMINAL HYSTERECTOMY  2005  . blood transfusion yearly    . BREAST REDUCTION SURGERY    . CESAREAN SECTION    . CESAREAN SECTION  1992  . CHOLECYSTECTOMY  2000  . lapcholecystectomy    . REDUCTION MAMMAPLASTY Bilateral 1985  . VESICOVAGINAL FISTULA CLOSURE W/ TAH      Family History  Problem Relation Age of Onset  . Colon polyps Mother   . Hemachromatosis Mother   . Hypertension Mother   . Depression Sister   . Hypertension Brother   . Cancer Father   . Colon polyps Brother   .  Breast cancer Cousin   . Breast cancer Cousin     Social History   Socioeconomic History  . Marital status: Married    Spouse name: [name]   . Number of children: 1  . Years of education: Not on file  . Highest education level: Some college, no degree  Occupational History  . Occupation: customer services     Comment: [place]  . Financial resource strain: Not hard at all  . Food insecurity:    Worry:  Never true    Inability: Never true  . Transportation needs:    Medical: No    Non-medical: No  Tobacco Use  . Smoking status: Former Smoker    Types: Cigarettes    Last attempt to quit: 01/19/1990    Years since quitting: 27.3  . Smokeless tobacco: Never Used  Substance and Sexual Activity  . Alcohol use: Yes    Alcohol/week: 0.0 oz    Comment: occasionally  . Drug use: No  . Sexual activity: Yes    Partners: Male  Lifestyle  . Physical activity:    Days per week: 3 days    Minutes per session: 60 min  . Stress: Not at all  Relationships  . Social connections:    Talks on phone: More than three times a week    Gets together: More than three times a week    Attends religious service: More than 4 times per year    Active member of club or organization: Yes    Attends meetings of clubs or organizations: More than 4 times per year    Relationship status: Married  . Intimate partner violence:    Fear of current or ex partner: No    Emotionally abused: No    Physically abused: No    Forced sexual activity: No  Other Topics Concern  . Not on file  Social History Narrative   Married; works  full time   Her daughter, [name] is a travel Marine scientist      Current Outpatient Medications:  .  amLODipine-benazepril (LOTREL) 5-20 MG capsule, Take 1 capsule by mouth daily., Disp: 90 capsule, Rfl: 1 .  aspirin 81 MG EC tablet, Take 81 mg by mouth daily.  , Disp: , Rfl:  .  atorvastatin (LIPITOR) 40 MG tablet, TAKE 1 TABLET BY MOUTH  DAILY, Disp: 90 tablet, Rfl: 1 .  Exenatide ER (BYDUREON BCISE) 2 MG/0.85ML AUIJ, Inject 2 mg into the skin once a week., Disp: 12 pen, Rfl: 1 .  fluticasone (FLONASE) 50 MCG/ACT nasal spray, Place 2 sprays into both nostrils daily., Disp: 48 g, Rfl: 0 .  levothyroxine (SYNTHROID, LEVOTHROID) 75 MCG tablet, TAKE 1 TABLET BY MOUTH  DAILY, Disp: 90 tablet, Rfl: 1 .  metFORMIN (GLUCOPHAGE-XR) 750 MG 24 hr tablet, TAKE 1 TABLET BY MOUTH  DAILY WITH BREAKFAST, Disp:  90 tablet, Rfl: 0 .  ONE TOUCH ULTRA TEST test strip, Check fsbs twice week E11.29, Disp: 100 each, Rfl: 2  No Known Allergies   ROS  Constitutional: Negative for fever or weight change.  Respiratory: Negative for cough and shortness of breath.   Cardiovascular: Negative for chest pain or palpitations.  Gastrointestinal: Negative for abdominal pain, no bowel changes.  Musculoskeletal: Negative for gait problem or joint swelling.  Skin: Negative for rash.  Neurological: Negative for dizziness or headache.  No other specific complaints in a complete review of systems (except as listed in HPI above).  Objective  Vitals:  05/17/17 0737  BP: 124/80  Pulse: 93  Resp: 16  SpO2: 98%  Weight: 259 lb 8 oz (117.7 kg)  Height: 5\' 7"  (1.702 m)    Body mass index is 40.64 kg/m.  Physical Exam  Constitutional: Patient appears well-developed and well-nourished. Obese  No distress.  HEENT: head atraumatic, normocephalic, pupils equal and reactive to light,  neck supple, throat within normal limits Cardiovascular: Normal rate, regular rhythm and normal heart sounds.  No murmur heard. No BLE edema. Pulmonary/Chest: Effort normal and breath sounds normal. No respiratory distress. Abdominal: Soft.  There is no tenderness. Psychiatric: Patient has a normal mood and affect. behavior is normal. Judgment and thought content normal. Muscular Skeletal: negative straight leg raise, she has pain during palpation of right lower back, normal rom of hip   Recent Results (from the past 2160 hour(s))  TSH     Status: None   Collection Time: 05/13/17  7:05 AM  Result Value Ref Range   TSH 2.970 0.450 - 4.500 uIU/mL  VITAMIN D 25 Hydroxy (Vit-D Deficiency, Fractures)     Status: Abnormal   Collection Time: 05/13/17  7:05 AM  Result Value Ref Range   Vit D, 25-Hydroxy 27.7 (L) 30.0 - 100.0 ng/mL    Comment: Vitamin D deficiency has been defined by the [place] and an Endocrine Society  practice guideline as a level of serum 25-OH vitamin D less than 20 ng/mL (1,2). The Endocrine Society went on to further define vitamin D insufficiency as a level between 21 and 29 ng/mL (2). 1. IOM (Institute of Medicine). 2010. Dietary reference    intakes for calcium and D. [place]: The    [employer]. 2. Holick MF, [place], Bischoff-Ferrari HA, et al.    Evaluation, treatment, and prevention of vitamin D    deficiency: an Endocrine Society clinical practice    guideline. JCEM. 2011 Jul; 96(7):1911-30.   Hemoglobin A1c     Status: Abnormal   Collection Time: 05/13/17  7:05 AM  Result Value Ref Range   Hgb A1c MFr Bld 6.8 (H) 4.8 - 5.6 %    Comment:          Prediabetes: 5.7 - 6.4          Diabetes: >6.4          Glycemic control for adults with diabetes: <7.0    Est. average glucose Bld gHb Est-mCnc 148 mg/dL  Vitamin B12     Status: None   Collection Time: 05/13/17  7:05 AM  Result Value Ref Range   Vitamin B-12 365 232 - 1,245 pg/mL  Comprehensive metabolic panel     Status: Abnormal   Collection Time: 05/13/17  7:05 AM  Result Value Ref Range   Glucose 156 (H) 65 - 99 mg/dL   BUN 11 6 - 24 mg/dL   Creatinine, Ser 0.71 0.57 - 1.00 mg/dL   GFR calc non Af Amer 97 >59 mL/min/1.73   GFR calc Af Amer 112 >59 mL/min/1.73   BUN/Creatinine Ratio 15 9 - 23   Sodium 141 134 - 144 mmol/L   Potassium 4.5 3.5 - 5.2 mmol/L   Chloride 103 96 - 106 mmol/L   CO2 26 20 - 29 mmol/L   Calcium 10.6 (H) 8.7 - 10.2 mg/dL   Total Protein 7.1 6.0 - 8.5 g/dL   Albumin 4.3 3.5 - 5.5 g/dL   Globulin, Total 2.8 1.5 - 4.5 g/dL   Albumin/Globulin Ratio 1.5 1.2 -  2.2   Bilirubin Total 0.7 0.0 - 1.2 mg/dL   Alkaline Phosphatase 118 (H) 39 - 117 IU/L   AST 44 (H) 0 - 40 IU/L   ALT 54 (H) 0 - 32 IU/L     PHQ2/9: Depression screen [facility] 2/9 02/02/2017 08/28/2016 04/10/2016 09/06/2015 06/24/2015  Decreased Interest 0 0 0 0 0  Down, Depressed, Hopeless 0 0 0 0 0  PHQ - 2 Score 0  0 0 0 0     Fall Risk: Fall Risk  05/17/2017 02/02/2017 08/28/2016 04/10/2016 09/06/2015  Falls in the past year? No No No No No     Functional Status Survey: Is the patient deaf or have difficulty hearing?: No Does the patient have difficulty seeing, even when wearing glasses/contacts?: No Does the patient have difficulty concentrating, remembering, or making decisions?: No Does the patient have difficulty walking or climbing stairs?: No Does the patient have difficulty dressing or bathing?: No Does the patient have difficulty doing errands alone such as visiting a doctor's office or shopping?: No   Assessment & Plan  1. Type 2 diabetes mellitus with microalbuminuria, with long-term current use of insulin (HCC)  - Exenatide ER (BYDUREON BCISE) 2 MG/0.85ML AUIJ; Inject 2 mg into the skin once a week.  Dispense: 12 pen; Refill: 1 - metFORMIN (GLUCOPHAGE-XR) 750 MG 24 hr tablet; Take 1 tablet (750 mg total) by mouth daily with breakfast.  Dispense: 90 tablet; Refill: 1 - ONE TOUCH ULTRA TEST test strip; Check fsbs twice week E11.29  Dispense: 100 each; Refill: 2  2. Benign hypertension  - amLODipine-benazepril (LOTREL) 5-20 MG capsule; Take 1 capsule by mouth daily.  Dispense: 90 capsule; Refill: 1  3. Dyslipidemia  - atorvastatin (LIPITOR) 40 MG tablet; Take 1 tablet (40 mg total) by mouth daily.  Dispense: 90 tablet; Refill: 1  4. Adult hypothyroidism  - levothyroxine (SYNTHROID, LEVOTHROID) 75 MCG tablet; Take 1 tablet (75 mcg total) by mouth daily.  Dispense: 90 tablet; Refill: 1  5. B12 deficiency  Continue B12  -B12 today   6. 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[place])  Keep follow up with hematologist   7. Secondary hyperparathyroidism, non-renal ([place])  Keep follow up with Dr. Gabriel Carina   8. Morbid obesity due to excess calories [facility])  Discussed with the patient the risk posed by an increased BMI. Discussed importance of portion control, calorie counting and at least 150  minutes of physical activity weekly. Avoid sweet beverages and drink more water. Eat at least 6 servings of fruit and vegetables daily   9. Right lumbar radiculitis  Tylenol prn for now, seeing Dr. Laveda Abbe   10. Vitamin D deficiency  - Vitamin D, Ergocalciferol, (DRISDOL) 50000 units CAPS capsule; Take 1 capsule (50,000 Units total) by mouth every 30 (thirty) days.  Dispense: 12 capsule; Refill: 0

## 2017-05-17 NOTE — Progress Notes (Signed)
Patient here for follow up. Voices no concerns.  °

## 2017-06-13 ENCOUNTER — Other Ambulatory Visit: Payer: Self-pay | Admitting: Family Medicine

## 2017-06-13 DIAGNOSIS — Z794 Long term (current) use of insulin: Principal | ICD-10-CM

## 2017-06-13 DIAGNOSIS — R809 Proteinuria, unspecified: Principal | ICD-10-CM

## 2017-06-13 DIAGNOSIS — E1129 Type 2 diabetes mellitus with other diabetic kidney complication: Secondary | ICD-10-CM

## 2017-06-15 ENCOUNTER — Other Ambulatory Visit: Payer: Self-pay | Admitting: Family Medicine

## 2017-06-15 DIAGNOSIS — Z1231 Encounter for screening mammogram for malignant neoplasm of breast: Secondary | ICD-10-CM

## 2017-06-28 ENCOUNTER — Other Ambulatory Visit: Payer: Self-pay | Admitting: Family Medicine

## 2017-06-28 DIAGNOSIS — Z794 Long term (current) use of insulin: Principal | ICD-10-CM

## 2017-06-28 DIAGNOSIS — R809 Proteinuria, unspecified: Principal | ICD-10-CM

## 2017-06-28 DIAGNOSIS — E1129 Type 2 diabetes mellitus with other diabetic kidney complication: Secondary | ICD-10-CM

## 2017-07-06 ENCOUNTER — Ambulatory Visit
Admission: RE | Admit: 2017-07-06 | Discharge: 2017-07-06 | Disposition: A | Discharge status: Home / Self Care | Payer: Managed Care, Other (non HMO) | Source: Ambulatory Visit | Attending: Family Medicine | Admitting: Family Medicine

## 2017-07-06 DIAGNOSIS — Z1231 Encounter for screening mammogram for malignant neoplasm of breast: Secondary | ICD-10-CM | POA: Insufficient documentation

## 2017-07-06 IMAGING — MG MM DIGITAL SCREENING BILAT W/ TOMO W/ CAD
8 of 14 series · 8 of 40 positions shown · non-contrast
Comparison: Previous exam(s).

CLINICAL DATA: Screening. History of bilateral reduction
mammoplasty.

EXAM:
DIGITAL SCREENING BILATERAL MAMMOGRAM WITH TOMO AND CAD

[L CC synth-2D (1 of 2)]
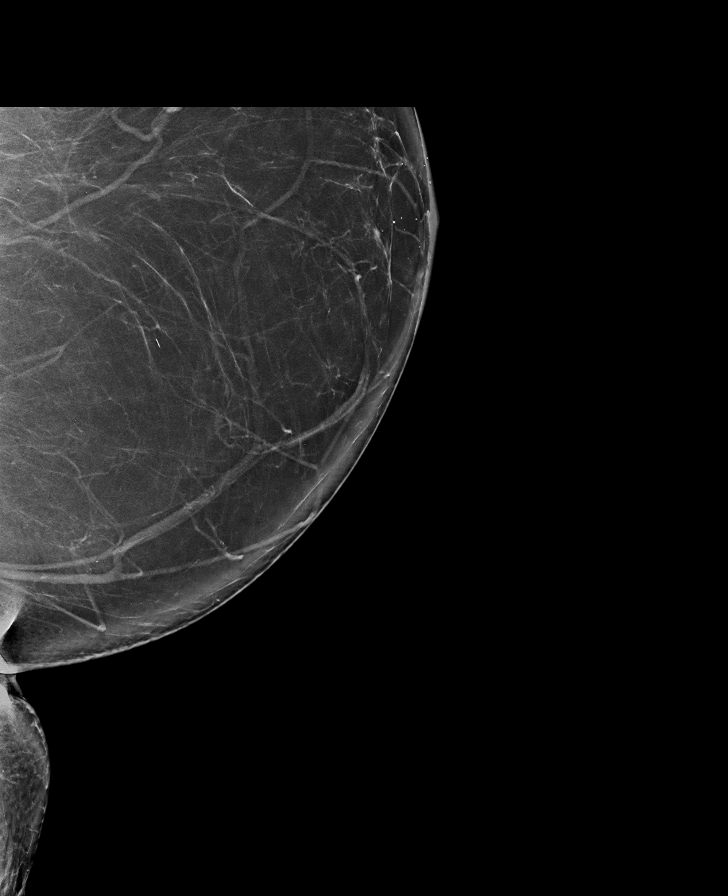

[R CC synth-2D]
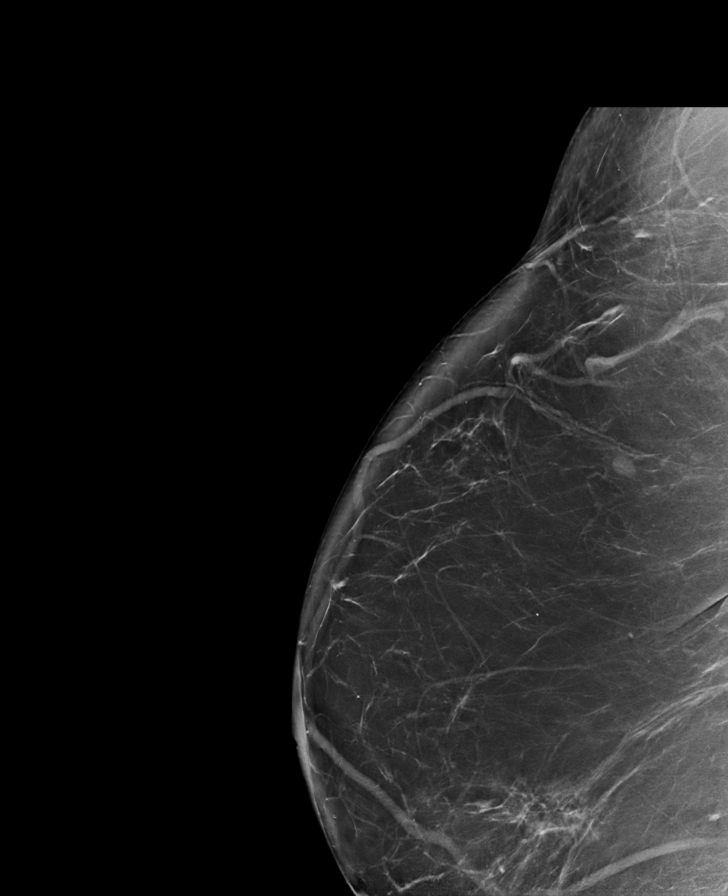

[L MLO synth-2D]
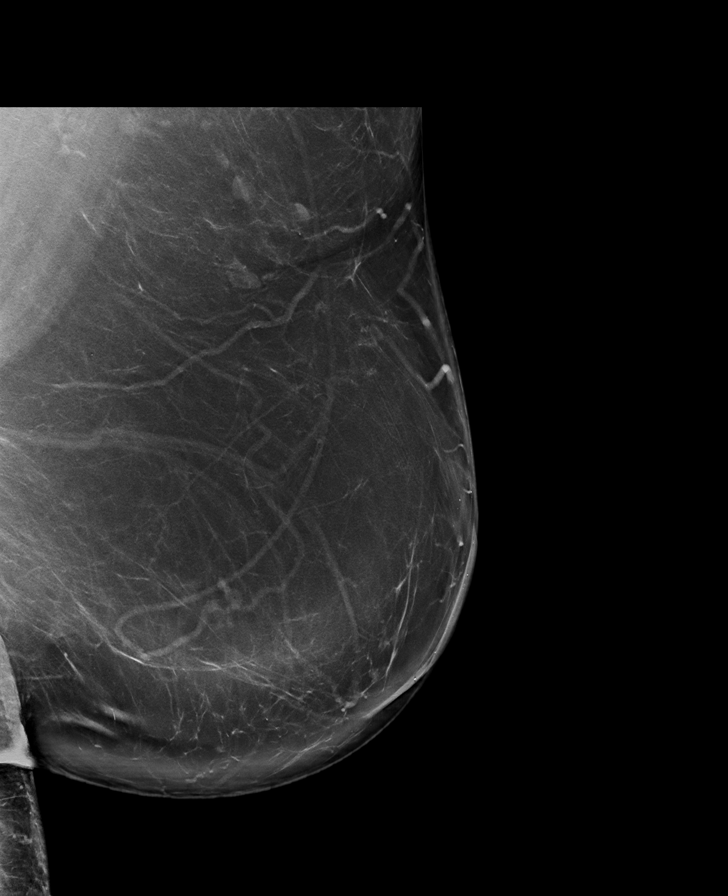

[R MLO synth-2D (1 of 2)]
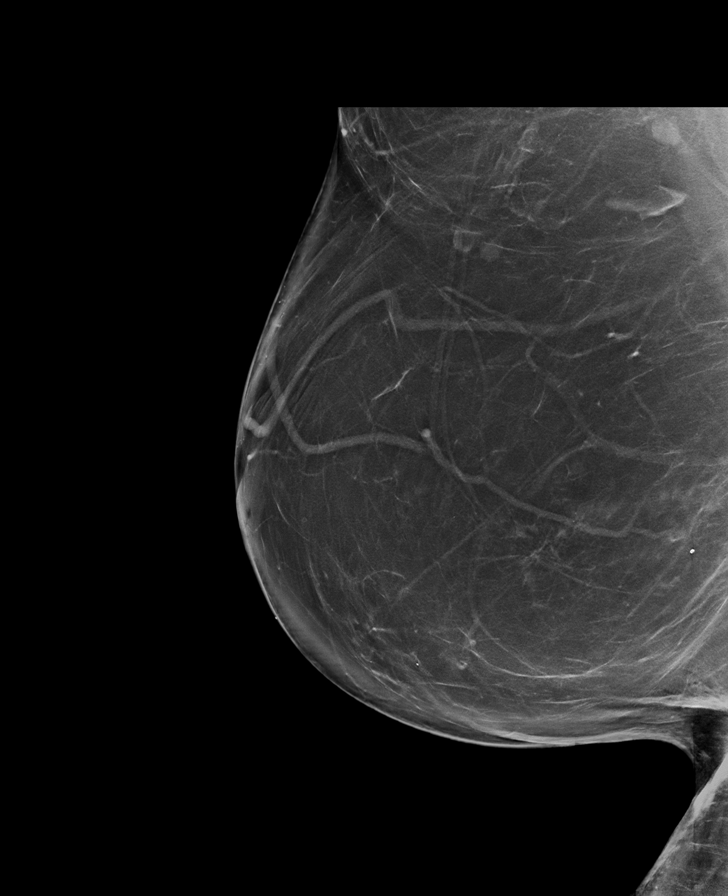

[R MLO synth-2D (2 of 2)]
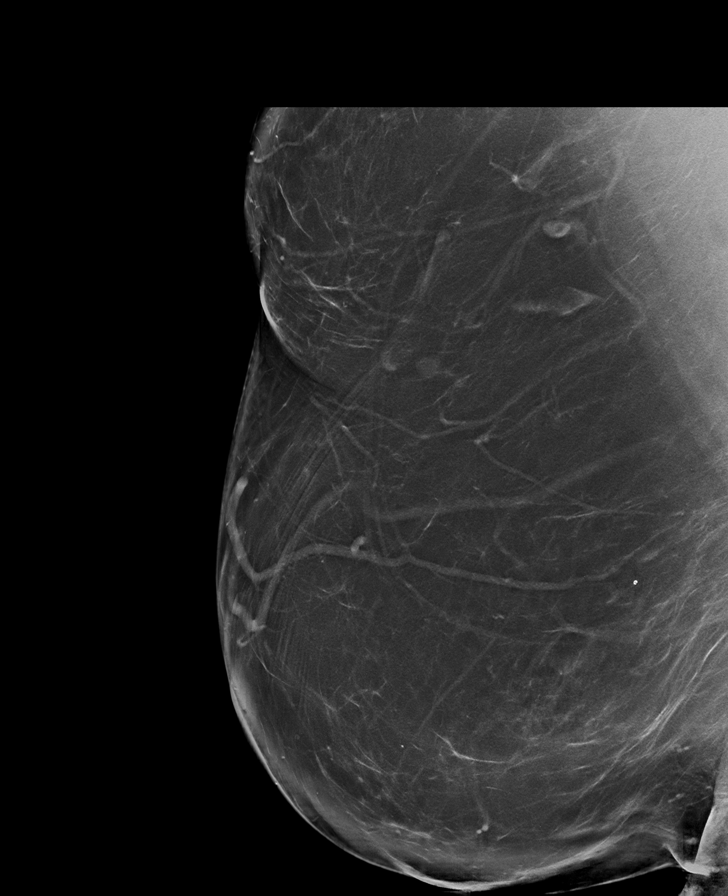

[L CC synth-2D (2 of 2)]
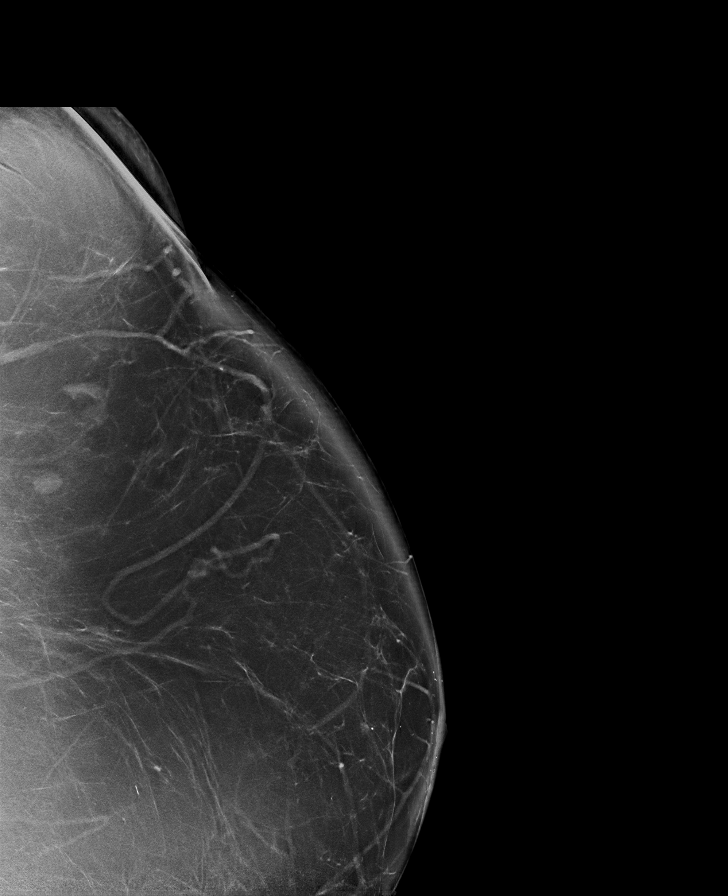

[R CV synth-2D]
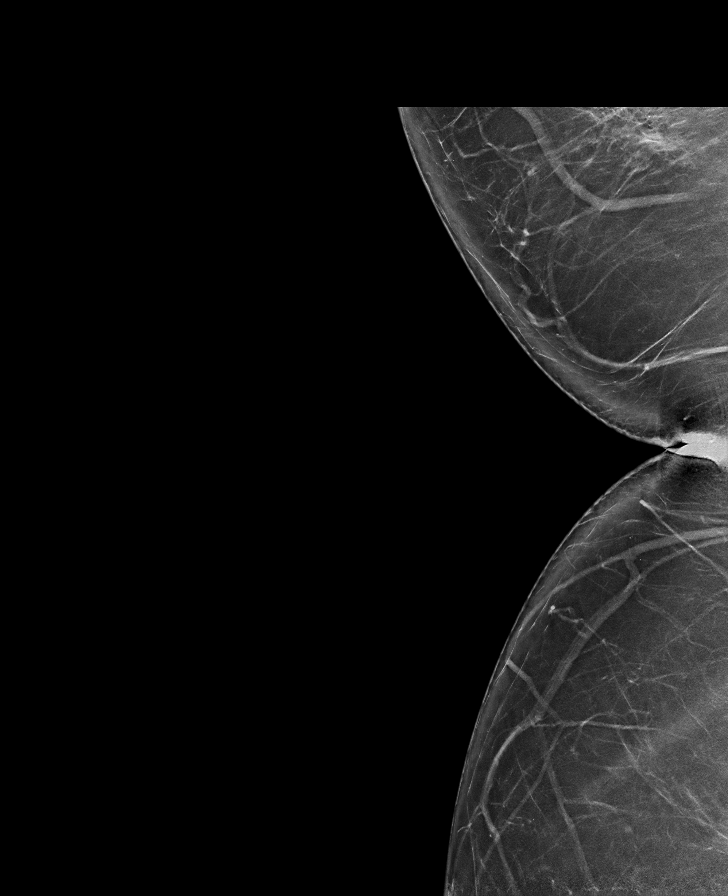

[L CC tomo · tomo slice 45/89.0]
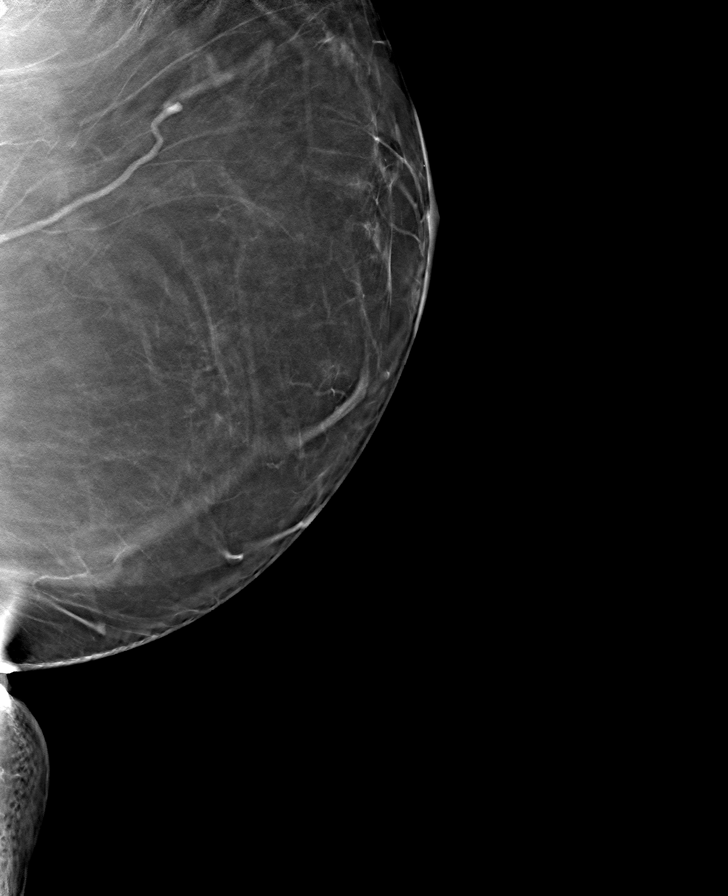

[8 of 40 positions shown; findings below may reference images not displayed]

ACR Breast Density Category b: There are scattered areas of
fibroglandular density.
FINDINGS: There are no findings suspicious for malignancy. Images were
processed with CAD.
IMPRESSION: No mammographic evidence of malignancy. A result letter of this
screening mammogram will be mailed directly to the patient.

RECOMMENDATION:
Screening mammogram in one year. (Code:[M8])

BI-RADS CATEGORY  1: Negative.

## 2017-08-16 ENCOUNTER — Inpatient Hospital Stay: Payer: Managed Care, Other (non HMO) | Attending: Oncology

## 2017-08-23 ENCOUNTER — Encounter: Payer: Self-pay | Admitting: Family Medicine

## 2017-08-23 ENCOUNTER — Telehealth: Payer: Self-pay | Admitting: Family Medicine

## 2017-08-23 DIAGNOSIS — I10 Essential (primary) hypertension: Secondary | ICD-10-CM

## 2017-08-23 DIAGNOSIS — E785 Hyperlipidemia, unspecified: Secondary | ICD-10-CM

## 2017-08-23 DIAGNOSIS — E559 Vitamin D deficiency, unspecified: Secondary | ICD-10-CM

## 2017-08-23 DIAGNOSIS — E039 Hypothyroidism, unspecified: Secondary | ICD-10-CM

## 2017-08-23 DIAGNOSIS — E538 Deficiency of other specified B group vitamins: Secondary | ICD-10-CM

## 2017-08-23 DIAGNOSIS — Z794 Long term (current) use of insulin: Principal | ICD-10-CM

## 2017-08-23 DIAGNOSIS — Z789 Other specified health status: Secondary | ICD-10-CM

## 2017-08-23 DIAGNOSIS — E1129 Type 2 diabetes mellitus with other diabetic kidney complication: Secondary | ICD-10-CM

## 2017-08-23 DIAGNOSIS — R809 Proteinuria, unspecified: Principal | ICD-10-CM

## 2017-08-23 NOTE — Telephone Encounter (Signed)
Please remind patient to use Mychart or office number for communication about her health needs because of HIPPA

## 2017-08-23 NOTE — Telephone Encounter (Signed)
Copied from [place]. Topic: Quick Communication - See Telephone Encounter >> Aug 23, 2017 10:50 AM Rutherford Nail, NT wrote: CRM for notification. See Telephone encounter for: 08/23/17. Patient calling and is wanting to know if her wellness paperwork for [employer](her job) could be done at her CPE appointment on 08/31/17? Please advise and inform patient.  CB#: [phone]

## 2017-08-23 NOTE — Telephone Encounter (Signed)
Informed patient she is able to bring her [employer] paperwork and have it filled out the day of her Physical. The [employer] form requires blood work and if she like to have it completed that day she could send [place] the lab list of what is required and have them done before he appointment.

## 2017-08-24 ENCOUNTER — Other Ambulatory Visit: Payer: Self-pay

## 2017-08-24 ENCOUNTER — Ambulatory Visit (INDEPENDENT_AMBULATORY_CARE_PROVIDER_SITE_OTHER): Payer: Managed Care, Other (non HMO)

## 2017-08-24 DIAGNOSIS — E538 Deficiency of other specified B group vitamins: Secondary | ICD-10-CM | POA: Diagnosis not present

## 2017-08-24 DIAGNOSIS — I10 Essential (primary) hypertension: Secondary | ICD-10-CM

## 2017-08-24 DIAGNOSIS — E785 Hyperlipidemia, unspecified: Secondary | ICD-10-CM

## 2017-08-24 DIAGNOSIS — Z01419 Encounter for gynecological examination (general) (routine) without abnormal findings: Secondary | ICD-10-CM

## 2017-08-24 DIAGNOSIS — Z124 Encounter for screening for malignant neoplasm of cervix: Secondary | ICD-10-CM

## 2017-08-24 DIAGNOSIS — Z789 Other specified health status: Secondary | ICD-10-CM

## 2017-08-24 DIAGNOSIS — Z794 Long term (current) use of insulin: Secondary | ICD-10-CM

## 2017-08-24 DIAGNOSIS — R809 Proteinuria, unspecified: Secondary | ICD-10-CM

## 2017-08-24 DIAGNOSIS — E1129 Type 2 diabetes mellitus with other diabetic kidney complication: Secondary | ICD-10-CM

## 2017-08-24 MED ORDER — CYANOCOBALAMIN 1000 MCG/ML IJ SOLN
1000.0000 ug | Freq: Once | INTRAMUSCULAR | Status: AC
  Administered 2017-08-24: 1000 ug via INTRAMUSCULAR

## 2017-08-27 LAB — CBC WITH DIFFERENTIAL/PLATELET
BASOS ABS: 0 10*3/uL (ref 0.0–0.2)
Basos: 1 %
EOS (ABSOLUTE): 0.1 10*3/uL (ref 0.0–0.4)
Eos: 2 %
HEMOGLOBIN: 14.9 g/dL (ref 11.1–15.9)
Hematocrit: 42.4 % (ref 34.0–46.6)
IMMATURE GRANS (ABS): 0 10*3/uL (ref 0.0–0.1)
IMMATURE GRANULOCYTES: 0 %
LYMPHS: 42 %
Lymphocytes Absolute: 3.1 10*3/uL (ref 0.7–3.1)
MCH: 35.9 pg — AB (ref 26.6–33.0)
MCHC: 35.1 g/dL (ref 31.5–35.7)
MCV: 102 fL — ABNORMAL HIGH (ref 79–97)
Monocytes Absolute: 0.4 10*3/uL (ref 0.1–0.9)
Monocytes: 6 %
NEUTROS ABS: 3.7 10*3/uL (ref 1.4–7.0)
NEUTROS PCT: 49 %
Platelets: 253 10*3/uL (ref 150–450)
RBC: 4.15 x10E6/uL (ref 3.77–5.28)
RDW: 13.1 % (ref 12.3–15.4)
WBC: 7.5 10*3/uL (ref 3.4–10.8)

## 2017-08-27 LAB — THYROID PANEL WITH TSH
FREE THYROXINE INDEX: 1.9 (ref 1.2–4.9)
T3 UPTAKE RATIO: 24 % (ref 24–39)
T4 TOTAL: 8.1 ug/dL (ref 4.5–12.0)
TSH: 1.56 u[IU]/mL (ref 0.450–4.500)

## 2017-08-27 LAB — COMP. METABOLIC PANEL (12)
A/G RATIO: 1.8 (ref 1.2–2.2)
AST: 60 IU/L — AB (ref 0–40)
Albumin: 4.4 g/dL (ref 3.5–5.5)
Alkaline Phosphatase: 104 IU/L (ref 39–117)
BUN/Creatinine Ratio: 12 (ref 9–23)
BUN: 8 mg/dL (ref 6–24)
Bilirubin Total: 0.8 mg/dL (ref 0.0–1.2)
CHLORIDE: 103 mmol/L (ref 96–106)
Calcium: 10.2 mg/dL (ref 8.7–10.2)
Creatinine, Ser: 0.67 mg/dL (ref 0.57–1.00)
GFR calc Af Amer: 114 mL/min/{1.73_m2} (ref >59)
GFR, EST NON AFRICAN AMERICAN: 99 mL/min/{1.73_m2} (ref >59)
GLOBULIN, TOTAL: 2.5 g/dL (ref 1.5–4.5)
GLUCOSE: 116 mg/dL — AB (ref 65–99)
POTASSIUM: 4.4 mmol/L (ref 3.5–5.2)
Sodium: 143 mmol/L (ref 134–144)
Total Protein: 6.9 g/dL (ref 6.0–8.5)

## 2017-08-27 LAB — PTH, INTACT AND CALCIUM: PTH: 68 pg/mL — AB (ref 15–65)

## 2017-08-27 LAB — LIPID PANEL
Chol/HDL Ratio: 4.6 ratio — ABNORMAL HIGH (ref 0.0–4.4)
Cholesterol, Total: 174 mg/dL (ref 100–199)
HDL: 38 mg/dL — AB (ref >39)
LDL Calculated: 104 mg/dL — ABNORMAL HIGH (ref 0–99)
TRIGLYCERIDES: 160 mg/dL — AB (ref 0–149)
VLDL Cholesterol Cal: 32 mg/dL (ref 5–40)

## 2017-08-27 LAB — IRON,TIBC AND FERRITIN PANEL
FERRITIN: 186 ng/mL — AB (ref 15–150)
Iron Saturation: 60 % — ABNORMAL HIGH (ref 15–55)
Iron: 147 ug/dL (ref 27–159)
TIBC: 245 ug/dL — AB (ref 250–450)
UIBC: 98 ug/dL — ABNORMAL LOW (ref 131–425)

## 2017-08-27 LAB — HEMOGLOBIN A1C
Est. average glucose Bld gHb Est-mCnc: 146 mg/dL
HEMOGLOBIN A1C: 6.7 % — AB (ref 4.8–5.6)

## 2017-08-27 LAB — MEASLES/MUMPS/RUBELLA IMMUNITY
RUBEOLA AB, IGG: >300 AU/mL (ref >29.9)
Rubella Antibodies, IGG: 1.76 index (ref >0.99)

## 2017-08-27 LAB — VITAMIN D 25 HYDROXY (VIT D DEFICIENCY, FRACTURES): VIT D 25 HYDROXY: 42.8 ng/mL (ref 30.0–100.0)

## 2017-08-27 LAB — VITAMIN B12: VITAMIN B 12: 1036 pg/mL (ref 232–1245)

## 2017-08-29 ENCOUNTER — Other Ambulatory Visit: Payer: Self-pay | Admitting: Family Medicine

## 2017-08-29 DIAGNOSIS — I10 Essential (primary) hypertension: Secondary | ICD-10-CM

## 2017-08-31 ENCOUNTER — Ambulatory Visit: Payer: 59 | Admitting: Family Medicine

## 2017-08-31 ENCOUNTER — Encounter: Payer: Self-pay | Admitting: Family Medicine

## 2017-08-31 ENCOUNTER — Encounter: Payer: Self-pay | Admitting: Oncology

## 2017-08-31 VITALS — BP 122/72 | HR 99 | Temp 98.4°F | Resp 16 | Ht 67.0 in | Wt 259.2 lb

## 2017-08-31 DIAGNOSIS — I10 Essential (primary) hypertension: Secondary | ICD-10-CM | POA: Diagnosis not present

## 2017-08-31 DIAGNOSIS — Z124 Encounter for screening for malignant neoplasm of cervix: Secondary | ICD-10-CM | POA: Diagnosis not present

## 2017-08-31 DIAGNOSIS — Z01419 Encounter for gynecological examination (general) (routine) without abnormal findings: Secondary | ICD-10-CM | POA: Diagnosis not present

## 2017-08-31 DIAGNOSIS — R809 Proteinuria, unspecified: Secondary | ICD-10-CM

## 2017-08-31 DIAGNOSIS — Z23 Encounter for immunization: Secondary | ICD-10-CM | POA: Diagnosis not present

## 2017-08-31 DIAGNOSIS — E1129 Type 2 diabetes mellitus with other diabetic kidney complication: Secondary | ICD-10-CM

## 2017-08-31 DIAGNOSIS — M2350 Chronic instability of knee, unspecified knee: Secondary | ICD-10-CM | POA: Diagnosis not present

## 2017-08-31 DIAGNOSIS — E211 Secondary hyperparathyroidism, not elsewhere classified: Secondary | ICD-10-CM

## 2017-08-31 DIAGNOSIS — E039 Hypothyroidism, unspecified: Secondary | ICD-10-CM

## 2017-08-31 DIAGNOSIS — Z794 Long term (current) use of insulin: Secondary | ICD-10-CM

## 2017-08-31 MED ORDER — MEASLES, MUMPS & RUBELLA VAC ~~LOC~~ INJ
0.5000 mL | INJECTION | Freq: Once | SUBCUTANEOUS | Status: AC
  Administered 2017-08-31: 0.5 mL via SUBCUTANEOUS

## 2017-08-31 MED ORDER — EXENATIDE ER 2 MG/0.85ML ~~LOC~~ AUIJ: 2.0000 mg | AUTO-INJECTOR | SUBCUTANEOUS | 1 refills | Status: DC

## 2017-08-31 MED ORDER — LEVOTHYROXINE SODIUM 75 MCG PO TABS: 75.0000 ug | ORAL_TABLET | Freq: Every day | ORAL | 1 refills | Status: DC

## 2017-08-31 MED ORDER — METFORMIN HCL ER 750 MG PO TB24: 750.0000 mg | ORAL_TABLET | Freq: Every day | ORAL | 1 refills | Status: DC

## 2017-08-31 NOTE — Patient Instructions (Signed)
Preventive Care 40-64 Years, Female Preventive care refers to lifestyle choices and visits with your health care provider that can promote health and wellness. What does preventive care include?  A yearly physical exam. This is also called an annual well check.  Dental exams once or twice a year.  Routine eye exams. Ask your health care provider how often you should have your eyes checked.  Personal lifestyle choices, including: ? Daily care of your teeth and gums. ? Regular physical activity. ? Eating a healthy diet. ? Avoiding tobacco and drug use. ? Limiting alcohol use. ? Practicing safe sex. ? Taking low-dose aspirin daily starting at age 58. ? Taking vitamin and mineral supplements as recommended by your health care provider. What happens during an annual well check? The services and screenings done by your health care provider during your annual well check will depend on your age, overall health, lifestyle risk factors, and family history of disease. Counseling Your health care provider may ask you questions about your:  Alcohol use.  Tobacco use.  Drug use.  Emotional well-being.  Home and relationship well-being.  Sexual activity.  Eating habits.  Work and work Statistician.  Method of birth control.  Menstrual cycle.  Pregnancy history.  Screening You may have the following tests or measurements:  Height, weight, and BMI.  Blood pressure.  Lipid and cholesterol levels. These may be checked every 5 years, or more frequently if you are over 81 years old.  Skin check.  Lung cancer screening. You may have this screening every year starting at age 78 if you have a 30-pack-year history of smoking and currently smoke or have quit within the past 15 years.  Fecal occult blood test (FOBT) of the stool. You may have this test every year starting at age 65.  Flexible sigmoidoscopy or colonoscopy. You may have a sigmoidoscopy every 5 years or a colonoscopy  every 10 years starting at age 30.  Hepatitis C blood test.  Hepatitis B blood test.  Sexually transmitted disease (STD) testing.  Diabetes screening. This is done by checking your blood sugar (glucose) after you have not eaten for a while (fasting). You may have this done every 1-3 years.  Mammogram. This may be done every 1-2 years. Talk to your health care provider about when you should start having regular mammograms. This may depend on whether you have a family history of breast cancer.  BRCA-related cancer screening. This may be done if you have a family history of breast, ovarian, tubal, or peritoneal cancers.  Pelvic exam and Pap test. This may be done every 3 years starting at age 80. Starting at age 36, this may be done every 5 years if you have a Pap test in combination with an HPV test.  Bone density scan. This is done to screen for osteoporosis. You may have this scan if you are at high risk for osteoporosis.  Discuss your test results, treatment options, and if necessary, the need for more tests with your health care provider. Vaccines Your health care provider may recommend certain vaccines, such as:  Influenza vaccine. This is recommended every year.  Tetanus, diphtheria, and acellular pertussis (Tdap, Td) vaccine. You may need a Td booster every 10 years.  Varicella vaccine. You may need this if you have not been vaccinated.  Zoster vaccine. You may need this after age 5.  Measles, mumps, and rubella (MMR) vaccine. You may need at least one dose of MMR if you were born in  1955 or later. You may also need a second dose.  Pneumococcal 13-valent conjugate (PCV13) vaccine. You may need this if you have certain conditions and were not previously vaccinated.  Pneumococcal polysaccharide (PPSV23) vaccine. You may need one or two doses if you smoke cigarettes or if you have certain conditions.  Meningococcal vaccine. You may need this if you have certain  conditions.  Hepatitis A vaccine. You may need this if you have certain conditions or if you travel or work in places where you may be exposed to hepatitis A.  Hepatitis B vaccine. You may need this if you have certain conditions or if you travel or work in places where you may be exposed to hepatitis B.  Haemophilus influenzae type b (Hib) vaccine. You may need this if you have certain conditions.  Talk to your health care provider about which screenings and vaccines you need and how often you need them. This information is not intended to replace advice given to you by your health care provider. Make sure you discuss any questions you have with your health care provider. Document Released: 02/01/2015 Document Revised: 09/25/2015 Document Reviewed: 11/06/2014 Elsevier Interactive Patient Education  2018 [employer]  

## 2017-08-31 NOTE — Progress Notes (Signed)
Name: Tabitha Shaw   MRN: [id number]    DOB: [birth year]   Date:08/31/2017       Progress Note  Subjective  Chief Complaint  Chief Complaint  Patient presents with  . Annual Exam  . Medication Refill  . Diabetes  . Hypertension  . Hyperlipidemia  . Hypothyroidism    HPI   Patient presents for annual CPE and follow up  DMII :she wasusing Bydureon andwastolerating well,  hgbA1C is 6.7%.   She states glucose at home has been in the 120's fasting and 150's post-prandially, she has been compliant with her diet, still going to the gym a couple of days for either zumba or cardio and [employer]. Denies polyphagia, polydipsia or polyuria, but she has nocturia once per night. Taking benazepril for microalbuminuria. Last LDL not at goal, on statin therapy   HTN: taking medication daily and denies side effects, she has not been checking her bp at home, no chest pain or palpitation, no dizziness.Continue Lotrel 5/20. BP has been at goal   Hyperlipidemia: taking Atorvastatin, no muscle aches,she has been compliant since she started taking medication every morning, last LDL is still above goal at 104, she is currently on 40 mg of Atorvastatin but she wants to hold off until next check.   Hypothyroidism: she is taking Synthroid 53mg  daily , denies constipation she has dry skin. TSH at goal , continue medications. No changes   Morbid obesity: : her weight has been stable since last year. Still on GLP-1 agonist, going to the gym but unable to lose weight.   Secondary hyperparathyroidism and vitamin D: seen by Dr. SGabriel Carina she thinks secondary to low vitamin D, she is on higher supplementation.Last pth improved, she has a follow up coming up with her  Hemochromatosis : sees Dr FGrayland Ormondand has phlebotomy   Knee instability : she has intermittent pain, but worried about her knees giving out intermittently and caused her to fall once. No redness or effusion   Diet: trying to eat  healthy Exercise: needs to increase visit to the gym   USPSTF grade A and B recommendations    [facility]  0     Depression:  [facility] 08/31/2017 02/02/2017 08/28/2016 04/10/2016 09/06/2015  Decreased Interest 0 0 0 0 0  Down, Depressed, Hopeless 0 0 0 0 0  PHQ - 2 Score 0 0 0 0 0   Hypertension: BP Readings from Last 3 Encounters:  08/31/17 122/72  05/17/17 (!) 143/98  05/17/17 124/80   Obesity: Wt Readings from Last 3 Encounters:  08/31/17 259 lb 3.2 oz (117.6 kg)  05/17/17 259 lb 12.8 oz (117.8 kg)  05/17/17 259 lb 8 oz (117.7 kg)   BMI Readings from Last 3 Encounters:  08/31/17 40.60 kg/m  05/17/17 40.69 kg/m  05/17/17 40.64 kg/m    Hep C Screening:  STD testing and prevention (HIV/chl/gon/syphilis): N/A Intimate partner violence: negative screen  Sexual History/Pain during Intercourse: no pain during intercourse  Menstrual History/LMP/Abnormal Bleeding: s/p hysterectomy, supra-cervical for DUB Incontinence Symptoms: no symptoms   Advanced Care Planning: A voluntary discussion about advance care planning including the explanation and discussion of advance directives.  Discussed health care proxy and Living will, and the patient was able to identify a health care proxy as husband .  Patient does not have a living will at present time.   Breast cancer:  HM Mammogram  Date  Value Ref Range Status  04/11/2014 normal  Final    BRCA gene screening: N/A Cervical cancer screening: due for repeat   Osteoporosis Screening:  HM Dexa Scan  Date Value Ref Range Status  04/17/2014 spine -1.3 hip -0.9  Final    Lipids:  Lab Results  Component Value Date   CHOL 174 08/26/2017   CHOL 182 10/12/2016   CHOL 178 09/07/2016   Lab Results  Component Value Date   HDL 38 (L) 08/26/2017   HDL 44 10/12/2016   HDL 42 09/07/2016   Lab Results  Component Value Date   LDLCALC 104 (H) 08/26/2017    LDLCALC 108 (H) 10/12/2016   LDLCALC 109 (H) 09/07/2016   Lab Results  Component Value Date   TRIG 160 (H) 08/26/2017   TRIG 149 10/12/2016   TRIG 135 09/07/2016   Lab Results  Component Value Date   CHOLHDL 4.6 (H) 08/26/2017   CHOLHDL 4.1 10/12/2016   CHOLHDL 6.4 (H) 04/08/2016   No results found for: LDLDIRECT  Glucose:  Glucose  Date Value Ref Range Status  08/26/2017 116 (H) 65 - 99 mg/dL Final  05/13/2017 156 (H) 65 - 99 mg/dL Final  10/12/2016 112 (H) 65 - 99 mg/dL Final    Skin cancer: discuss atypical moles  Colorectal cancer: repeat in 2020 Lung cancer:   Low Dose CT Chest recommended if Age 55-80 years, 30 pack-year currently smoking OR have quit w/in 15years. Patient does not qualify.   ECG:up to date   Patient Active Problem List   Diagnosis Date Noted  . Cervical high risk HPV (human papillomavirus) test positive 09/03/2016  . B12 deficiency 09/04/2015  . Elevated PTHrP level 09/04/2015  . Benign hypertension 09/12/2014  . Acanthosis nigricans 09/09/2014  . Diabetes mellitus with renal manifestation ([place]) 09/09/2014  . Dyslipidemia 09/09/2014  . Abnormal electrocardiogram 09/09/2014  . Enlarged kidney 09/09/2014  . 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[place]) 09/09/2014  . Enlarged liver 09/09/2014  . Calcium blood increased 09/09/2014  . Adult hypothyroidism 09/09/2014  . Microalbuminuria 09/09/2014  . Morbid obesity due to excess calories ([place]) 09/09/2014  . Spinal stenosis of lumbar region with neurogenic claudication 09/09/2014  . Osteopenia 09/09/2014  . Vitamin D deficiency 09/09/2014    Past Surgical History:  Procedure Laterality Date  . ABDOMINAL HYSTERECTOMY  2005  . blood transfusion yearly    . BREAST REDUCTION SURGERY    . CESAREAN SECTION    . CESAREAN SECTION  1992  . CHOLECYSTECTOMY  2000  . lapcholecystectomy    . REDUCTION MAMMAPLASTY Bilateral 1985  . VESICOVAGINAL FISTULA CLOSURE W/ TAH      Family History  Problem Relation Age  of Onset  . Colon polyps Mother   . Hemachromatosis Mother   . Hypertension Mother   . Depression Sister   . Hypertension Brother   . Colon polyps Brother   . Cancer Father   . Breast cancer Cousin   . Breast cancer Cousin     Social History   Socioeconomic History  . Marital status: Married    Spouse name: [name]   . Number of children: 1  . Years of education: Not on file  . Highest education level: Some college, no degree  Occupational History  . Occupation: customer services     Comment: [place]  . Financial resource strain: Not hard at all  . Food insecurity:    Worry: Never true    Inability: Never true  . Transportation  needs:    Medical: No    Non-medical: No  Tobacco Use  . Smoking status: Former Smoker    Packs/day: 0.25    Years: 8.00    Pack years: 2.00    Types: Cigarettes    Start date: 01/19/1982    Last attempt to quit: 01/19/1990    Years since quitting: 27.6  . Smokeless tobacco: Never Used  Substance and Sexual Activity  . Alcohol use: Yes    Alcohol/week: 0.0 standard drinks    Comment: occasionally  . Drug use: No  . Sexual activity: Yes    Partners: Male  Lifestyle  . Physical activity:    Days per week: 3 days    Minutes per session: 60 min  . Stress: Not at all  Relationships  . Social connections:    Talks on phone: More than three times a week    Gets together: More than three times a week    Attends religious service: More than 4 times per year    Active member of club or organization: Yes    Attends meetings of clubs or organizations: More than 4 times per year    Relationship status: Married  . Intimate partner violence:    Fear of current or ex partner: No    Emotionally abused: No    Physically abused: No    Forced sexual activity: No  Other Topics Concern  . Not on file  Social History Narrative   Married; works  full time   Her daughter, [name] is a travel Marine scientist      Current Outpatient Medications:  .   amLODipine-benazepril (LOTREL) 5-20 MG capsule, TAKE 1 CAPSULE BY MOUTH  DAILY, Disp: 90 capsule, Rfl: 1 .  aspirin 81 MG EC tablet, Take 81 mg by mouth daily.  , Disp: , Rfl:  .  atorvastatin (LIPITOR) 40 MG tablet, Take 1 tablet (40 mg total) by mouth daily., Disp: 90 tablet, Rfl: 1 .  Exenatide ER (BYDUREON BCISE) 2 MG/0.85ML AUIJ, Inject 2 mg into the skin once a week., Disp: 12 pen, Rfl: 1 .  fluticasone (FLONASE) 50 MCG/ACT nasal spray, Place 2 sprays into both nostrils daily., Disp: 48 g, Rfl: 0 .  levothyroxine (SYNTHROID, LEVOTHROID) 75 MCG tablet, Take 1 tablet (75 mcg total) by mouth daily., Disp: 90 tablet, Rfl: 1 .  metFORMIN (GLUCOPHAGE-XR) 750 MG 24 hr tablet, Take 1 tablet (750 mg total) by mouth daily with breakfast., Disp: 90 tablet, Rfl: 1 .  ONE TOUCH ULTRA TEST test strip, Check fsbs twice week E11.29, Disp: 100 each, Rfl: 2 .  Vitamin D, Ergocalciferol, (DRISDOL) 50000 units CAPS capsule, Take 1 capsule (50,000 Units total) by mouth every 30 (thirty) days., Disp: 12 capsule, Rfl: 0  No Known Allergies   ROS  Constitutional: Negative for fever or weight change.  Respiratory: Negative for cough and shortness of breath.   Cardiovascular: Negative for chest pain or palpitations.  Gastrointestinal: Negative for abdominal pain, no bowel changes.  Musculoskeletal: Positive  for gait problem ( knees are giving out)  or joint swelling.  Skin: Negative for rash.  Neurological: Negative for dizziness or headache.  No other specific complaints in a complete review of systems (except as listed in HPI above).  Objective  Vitals:   08/31/17 0834  BP: 122/72  Pulse: 99  Resp: 16  Temp: 98.4 F (36.9 C)  TempSrc: Oral  SpO2: 98%  Weight: 259 lb 3.2 oz (117.6 kg)  Height: '5\' 7"'$  (  1.702 m)    Body mass index is 40.6 kg/m.  Physical Exam  Constitutional: Patient appears well-developed and obese . No distress.  HENT: Head: Normocephalic and atraumatic. Ears: B TMs ok, no  erythema or effusion; Nose: Nose normal. Mouth/Throat: Oropharynx is clear and moist. No oropharyngeal exudate.  Eyes: Conjunctivae and EOM are normal. Pupils are equal, round, and reactive to light. No scleral icterus.  Neck: Normal range of motion. Neck supple. No JVD present. No thyromegaly present.  Cardiovascular: Normal rate, regular rhythm and normal heart sounds.  No murmur heard. No BLE edema. Pulmonary/Chest: Effort normal and breath sounds normal. No respiratory distress. Abdominal: Soft. Bowel sounds are normal, no distension. There is no tenderness. no masses Breast: no lumps or masses, no nipple discharge or rashes FEMALE GENITALIA: External genitalia normal External urethra normal Vaginal vault normal without discharge or lesions Cervix normal without discharge or lesions Bimanual exam normal without masses RECTAL: not done Musculoskeletal: Normal range of motion, no joint effusions. No gross deformities Neurological: he is alert and oriented to person, place, and time. No cranial nerve deficit. Coordination, balance, strength, speech and gait are normal.  Skin: Skin is warm and dry. No rash noted. No erythema.  Psychiatric: Patient has a normal mood and affect. behavior is normal. Judgment and thought content normal.   Recent Results (from the past 2160 hour(s))  Fe+TIBC+Fer     Status: Abnormal   Collection Time: 08/26/17 10:34 AM  Result Value Ref Range   Total Iron Binding Capacity 245 (L) 250 - 450 ug/dL   UIBC 98 (L) 131 - 425 ug/dL   Iron 147 27 - 159 ug/dL   Iron Saturation 60 (H) 15 - 55 %   Ferritin 186 (H) 15 - 150 ng/mL  CBC with Differential/Platelet     Status: Abnormal   Collection Time: 08/26/17 10:34 AM  Result Value Ref Range   WBC 7.5 3.4 - 10.8 x10E3/uL   RBC 4.15 3.77 - 5.28 x10E6/uL   Hemoglobin 14.9 11.1 - 15.9 g/dL   Hematocrit 42.4 34.0 - 46.6 %   MCV 102 (H) 79 - 97 fL   MCH 35.9 (H) 26.6 - 33.0 pg   MCHC 35.1 31.5 - 35.7 g/dL   RDW 13.1  12.3 - 15.4 %   Platelets 253 150 - 450 x10E3/uL   Neutrophils 49 Not Estab. %   Lymphs 42 Not Estab. %   Monocytes 6 Not Estab. %   Eos 2 Not Estab. %   Basos 1 Not Estab. %   Neutrophils Absolute 3.7 1.4 - 7.0 x10E3/uL   Lymphocytes Absolute 3.1 0.7 - 3.1 x10E3/uL   Monocytes Absolute 0.4 0.1 - 0.9 x10E3/uL   EOS (ABSOLUTE) 0.1 0.0 - 0.4 x10E3/uL   Basophils Absolute 0.0 0.0 - 0.2 x10E3/uL   Immature Granulocytes 0 Not Estab. %   Immature Grans (Abs) 0.0 0.0 - 0.1 x10E3/uL  Thyroid Panel With TSH     Status: None   Collection Time: 08/26/17 10:34 AM  Result Value Ref Range   TSH 1.560 0.450 - 4.500 uIU/mL   T4, Total 8.1 4.5 - 12.0 ug/dL   T3 Uptake Ratio 24 24 - 39 %   Free Thyroxine Index 1.9 1.2 - 4.9  PTH, Intact and Calcium     Status: Abnormal   Collection Time: 08/26/17 10:34 AM  Result Value Ref Range   PTH 68 (H) 15 - 65 pg/mL   PTH Interp Comment     Comment: Interpretation  Intact PTH    Calcium                                 (pg/mL)      (mg/dL) Normal                          15 - 65     8.6 - 10.2 Primary Hyperparathyroidism         >65          >10.2 Secondary Hyperparathyroidism       >65          <10.2 Non-Parathyroid Hypercalcemia       <65          >10.2 Hypoparathyroidism                  <15          < 8.6 Non-Parathyroid Hypocalcemia    15 - 65          < 8.6   Comp. Metabolic Panel (12)     Status: Abnormal   Collection Time: 08/26/17 10:34 AM  Result Value Ref Range   Glucose 116 (H) 65 - 99 mg/dL   BUN 8 6 - 24 mg/dL   Creatinine, Ser 0.67 0.57 - 1.00 mg/dL   GFR calc non Af Amer 99 >59 mL/min/1.73   GFR calc Af Amer 114 >59 mL/min/1.73   BUN/Creatinine Ratio 12 9 - 23   Sodium 143 134 - 144 mmol/L   Potassium 4.4 3.5 - 5.2 mmol/L   Chloride 103 96 - 106 mmol/L   Calcium 10.2 8.7 - 10.2 mg/dL   Total Protein 6.9 6.0 - 8.5 g/dL   Albumin 4.4 3.5 - 5.5 g/dL   Globulin, Total 2.5 1.5 - 4.5 g/dL   Albumin/Globulin Ratio 1.8 1.2  - 2.2   Bilirubin Total 0.8 0.0 - 1.2 mg/dL   Alkaline Phosphatase 104 39 - 117 IU/L   AST 60 (H) 0 - 40 IU/L  Lipid panel     Status: Abnormal   Collection Time: 08/26/17 10:34 AM  Result Value Ref Range   Cholesterol, Total 174 100 - 199 mg/dL   Triglycerides 160 (H) 0 - 149 mg/dL   HDL 38 (L) >39 mg/dL   VLDL Cholesterol Cal 32 5 - 40 mg/dL   LDL Calculated 104 (H) 0 - 99 mg/dL   Chol/HDL Ratio 4.6 (H) 0.0 - 4.4 ratio    Comment:                                   T. Chol/HDL Ratio                                             Men  Women                               1/2 Avg.Risk  3.4    3.3                                   Avg.Risk  5.0  4.4                                2X Avg.Risk  9.6    7.1                                3X Avg.Risk 23.4   11.0   Measles/Mumps/Rubella Immunity     Status: Abnormal   Collection Time: 08/26/17 10:34 AM  Result Value Ref Range   Rubella Antibodies, IGG 1.76 Immune >0.99 index    Comment:                                 Non-immune       <0.90                                 Equivocal  0.90 - 0.99                                 Immune           >0.99    RUBEOLA AB, IGG >300.0 Immune >29.9 AU/mL    Comment:                                  Negative        <25.0                                  Equivocal 25.0 - 29.9                                  Positive        >29.9 Presence of antibodies to Rubeola is presumptive evidence of immunity except when acute infection is suspected.    MUMPS ABS, IGG <9.0 (L) Immune >10.9 AU/mL    Comment:                                 Negative         <9.0                                 Equivocal  9.0 - 10.9                                 Positive        >10.9 A positive result generally indicates past exposure to Mumps virus or previous vaccination.   Hemoglobin A1c     Status: Abnormal   Collection Time: 08/26/17 10:34 AM  Result Value Ref Range   Hgb A1c MFr Bld 6.7 (H) 4.8 - 5.6 %    Comment:           Prediabetes: 5.7 - 6.4          Diabetes: >6.4  Glycemic control for adults with diabetes: <7.0    Est. average glucose Bld gHb Est-mCnc 146 mg/dL  VITAMIN D 25 Hydroxy (Vit-D Deficiency, Fractures)     Status: None   Collection Time: 08/26/17 10:34 AM  Result Value Ref Range   Vit D, 25-Hydroxy 42.8 30.0 - 100.0 ng/mL    Comment: Vitamin D deficiency has been defined by the [place] practice guideline as a level of serum 25-OH vitamin D less than 20 ng/mL (1,2). The Endocrine Society went on to further define vitamin D insufficiency as a level between 21 and 29 ng/mL (2). 1. IOM (Institute of Medicine). 2010. Dietary reference    intakes for calcium and D. [place]: The    [employer]. 2. Holick MF, Binkley [place], Bischoff-Ferrari HA, et al.    Evaluation, treatment, and prevention of vitamin D    deficiency: an Endocrine Society clinical practice    guideline. JCEM. 2011 Jul; 96(7):1911-30.   Vitamin B12     Status: None   Collection Time: 08/26/17 10:34 AM  Result Value Ref Range   Vitamin B-12 1,036 232 - 1,245 pg/mL      PHQ2/9: Depression screen [facility] 2/9 08/31/2017 02/02/2017 08/28/2016 04/10/2016 09/06/2015  Decreased Interest 0 0 0 0 0  Down, Depressed, Hopeless 0 0 0 0 0  PHQ - 2 Score 0 0 0 0 0    Fall Risk: Fall Risk  05/17/2017 02/02/2017 08/28/2016 04/10/2016 09/06/2015  Falls in the past year? No No No No No    Functional Status Survey: Is the patient deaf or have difficulty hearing?: No Does the patient have difficulty seeing, even when wearing glasses/contacts?: Yes Does the patient have difficulty concentrating, remembering, or making decisions?: No Does the patient have difficulty walking or climbing stairs?: No Does the patient have difficulty dressing or bathing?: No Does the patient have difficulty doing errands alone such as visiting a doctor's office or shopping?: No   Assessment &  Plan  1. Well woman exam   2. Cervical cancer screening  - Pap IG and HPV (high risk) DNA detection  3. Adult hypothyroidism  - levothyroxine (SYNTHROID, LEVOTHROID) 75 MCG tablet; Take 1 tablet (75 mcg total) by mouth daily.  Dispense: 90 tablet; Refill: 1  4. Need for MMR vaccine  - MMR and varicella combined vaccine subcutaneous  5. Benign hypertension  Continue medication   6. Morbid obesity due to excess calories ([facility])  stable  7. Secondary hyperparathyroidism, non-renal ([place])  Continue follow up with Dr. Gabriel Carina   8. Recurrent instability of knee joint, unspecified laterality  - Ambulatory referral to Orthopedic Surgery  9. 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[place])  Keep follow up with hematologist   10. Type 2 diabetes mellitus with microalbuminuria, with long-term current use of insulin ([facility])  - Exenatide ER (BYDUREON BCISE) 2 MG/0.85ML AUIJ; Inject 2 mg into the skin once a week.  Dispense: 12 pen; Refill: 1 - metFORMIN (GLUCOPHAGE-XR) 750 MG 24 hr tablet; Take 1 tablet (750 mg total) by mouth daily with breakfast.  Dispense: 90 tablet; Refill: 1  -USPSTF grade A and B recommendations reviewed with patient; age-appropriate recommendations, preventive care, screening tests, etc discussed and encouraged; healthy living encouraged; see AVS for patient education given to patient -Discussed importance of 150 minutes of physical activity weekly, eat two servings of fish weekly, eat one serving of tree nuts ( cashews, pistachios, pecans, almonds.Marland Kitchen) every other day, eat 6 servings of fruit/vegetables daily and drink  plenty of water and avoid sweet beverages.

## 2017-09-02 ENCOUNTER — Encounter: Payer: Self-pay | Admitting: Oncology

## 2017-09-06 ENCOUNTER — Encounter: Payer: Self-pay | Admitting: Oncology

## 2017-09-06 LAB — PAP IG AND HPV HIGH-RISK
HPV, HIGH-RISK: POSITIVE — AB
PAP SMEAR COMMENT: 0

## 2017-09-06 LAB — SPECIMEN STATUS REPORT

## 2017-09-14 ENCOUNTER — Encounter: Payer: Self-pay | Admitting: Family Medicine

## 2017-10-04 ENCOUNTER — Other Ambulatory Visit: Payer: Self-pay | Admitting: Family Medicine

## 2017-10-04 DIAGNOSIS — E785 Hyperlipidemia, unspecified: Secondary | ICD-10-CM

## 2017-10-04 DIAGNOSIS — E038 Other specified hypothyroidism: Secondary | ICD-10-CM

## 2017-11-12 ENCOUNTER — Encounter: Payer: Self-pay | Admitting: Oncology

## 2017-11-14 NOTE — Progress Notes (Signed)
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  INTERVAL HISTORY: Patient returns to clinic today for routine six-month evaluation and consideration of phlebotomy.  She is undergoing parathyroid surgery at [facility] in the near future.  She currently feels well and is asymptomatic. She has no neurologic complaints.  She denies any recent fevers or illnesses.  She denies any chest pain or shortness of breath.  She denies any weakness or fatigue.  She has a good appetite and denies weight loss. She denies any nausea, vomiting, constipation, or diarrhea. She has no urinary complaints.  Patient feels at her baseline and offers no specific complaints today.  REVIEW OF SYSTEMS:   Review of Systems  Constitutional: Negative.  Negative for fever, malaise/fatigue and weight loss.  Respiratory: Negative.  Negative for cough and shortness of breath.   Cardiovascular: Negative.  Negative for chest pain and leg swelling.  Gastrointestinal: Negative.  Negative for abdominal pain, blood in stool and melena.  Genitourinary: Negative.  Negative for dysuria.  Musculoskeletal: Negative.  Negative for back pain.  Skin: Negative.  Negative for rash.  Neurological: Negative.  Negative for sensory change, focal weakness and weakness.  Psychiatric/Behavioral: Negative.  The patient is not nervous/anxious.     As per HPI. Otherwise, a complete review of systems is negative.  PAST MEDICAL HISTORY: Past Medical History:  Diagnosis Date  . Acquired acanthosis nigricans   . Diabetes type 2,  controlled ([place])    if controlled was not specified  . Hemochromatosis   . Hepatomegaly   . HLD (hyperlipidemia)   . HTN (hypertension)   . Hyperlipidemia   . Hypertension   . Hypertrophy of kidney   . Hypothyroidism   . Intertrigo   . Obesity   . Osteopenia   . Postinflammatory hyperpigmentation   . Snoring   . Thyroid disease   . Vitamin D deficiency     PAST SURGICAL HISTORY: Past Surgical History:  Procedure Laterality Date  . ABDOMINAL HYSTERECTOMY  2005  . blood transfusion yearly    . BREAST REDUCTION SURGERY    . CESAREAN SECTION    . CESAREAN SECTION  1992  . CHOLECYSTECTOMY  2000  . lapcholecystectomy    . REDUCTION MAMMAPLASTY Bilateral 1985  . VESICOVAGINAL FISTULA CLOSURE W/ TAH      FAMILY HISTORY Family History  Problem Relation Age of Onset  . Colon polyps Mother   . Hemachromatosis Mother   . Hypertension Mother   . Depression Sister   . Hypertension Brother   . Colon polyps Brother   . Cancer Father   . Breast cancer Cousin   . Breast cancer Cousin        ADVANCED DIRECTIVES:    HEALTH MAINTENANCE: Social History   Tobacco Use  . Smoking status: Former Smoker    Packs/day: 0.25    Years: 8.00    Pack years: 2.00    Types: Cigarettes    Start date: 01/19/1982    Last attempt to quit: 01/19/1990    Years since quitting: 27.8  . Smokeless tobacco: Never Used  Substance Use Topics  . Alcohol use: Yes  Alcohol/week: 0.0 standard drinks    Comment: occasionally  . Drug use: No     Colonoscopy:  PAP:  Bone density:  Lipid panel:  No Known Allergies  Current Outpatient Medications  Medication Sig Dispense Refill  . amLODipine-benazepril (LOTREL) 5-20 MG capsule TAKE 1 CAPSULE BY MOUTH  DAILY 90 capsule 1  . aspirin 81 MG EC tablet Take 81 mg by mouth daily.      Marland Kitchen atorvastatin (LIPITOR) 40 MG tablet TAKE 1 TABLET BY MOUTH  DAILY 90 tablet 1  . Exenatide ER (BYDUREON BCISE) 2 MG/0.85ML AUIJ Inject 2 mg into the skin once a  week. 12 pen 1  . fluticasone (FLONASE) 50 MCG/ACT nasal spray Place 2 sprays into both nostrils daily. 48 g 0  . levothyroxine (SYNTHROID, LEVOTHROID) 75 MCG tablet Take 1 tablet (75 mcg total) by mouth daily. 90 tablet 1  . metFORMIN (GLUCOPHAGE-XR) 750 MG 24 hr tablet Take 1 tablet (750 mg total) by mouth daily with breakfast. 90 tablet 1  . ONE TOUCH ULTRA TEST test strip Check fsbs twice week E11.29 100 each 2  . Vitamin D, Ergocalciferol, (DRISDOL) 50000 units CAPS capsule Take 1 capsule (50,000 Units total) by mouth every 30 (thirty) days. (Patient taking differently: Take 50,000 Units by mouth every 30 (thirty) days. Take 2 every week) 12 capsule 0   No current facility-administered medications for this visit.     OBJECTIVE: Vitals:   11/15/17 1519  BP: (!) 150/90  Pulse: 95  Resp: 18  Temp: 97.8 F (36.6 C)     Body mass index is 40.88 kg/m.    ECOG FS:0 - Asymptomatic  General: Well-developed, well-nourished, no acute distress. Eyes: Pink conjunctiva, anicteric sclera. HEENT: Normocephalic, moist mucous membranes. Lungs: Clear to auscultation bilaterally. Heart: Regular rate and rhythm. No rubs, murmurs, or gallops. Abdomen: Soft, nontender, nondistended. No organomegaly noted, normoactive bowel sounds. Musculoskeletal: No edema, cyanosis, or clubbing. Neuro: Alert, answering all questions appropriately. Cranial nerves grossly intact. Skin: No rashes or petechiae noted. Psych: Normal affect.  LAB RESULTS:  Lab Results  Component Value Date   NA 143 08/26/2017   K 4.4 08/26/2017   CL 103 08/26/2017   CO2 26 05/13/2017   GLUCOSE 116 (H) 08/26/2017   BUN 8 08/26/2017   CREATININE 0.67 08/26/2017   CALCIUM 10.2 08/26/2017   PROT 6.9 08/26/2017   ALBUMIN 4.4 08/26/2017   AST 60 (H) 08/26/2017   ALT 54 (H) 05/13/2017   ALKPHOS 104 08/26/2017   BILITOT 0.8 08/26/2017   GFRNONAA 99 08/26/2017   GFRAA 114 08/26/2017    Lab Results  Component Value Date   WBC  7.5 08/26/2017   NEUTROABS 3.7 08/26/2017   HGB 14.9 08/26/2017   HCT 42.4 08/26/2017   MCV 102 (H) 08/26/2017   PLT 253 08/26/2017     STUDIES: No results found.  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  PLAN:    1.  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  Her hemoglobin is 15.5.  All of her other laboratory work continues to be within normal limits.  Goal ferritin will be between 50 and 100.  Proceed with 400 mL phlebotomy today with One Blood.  Plan to increase her phlebotomy to 500 mL with her next phlebotomy draw.  Continue phlebotomy every 3 months.  Return to clinic in 3 months for phlebotomy only and then in 6 months for further evaluation  and continuation of phlebotomy.  Patient continues to get all of her laboratory work at [facility].   2.  Parathyroid surgery: Continue treatment and follow-up at [facility].  Patient expressed understanding and was in agreement with this plan. She also understands that She can call clinic at any time with any questions, concerns, or complaints.    Lloyd Huger, MD   11/17/2017 6:55 AM

## 2017-11-15 ENCOUNTER — Inpatient Hospital Stay: Payer: Managed Care, Other (non HMO)

## 2017-11-15 ENCOUNTER — Encounter: Payer: Self-pay | Admitting: Oncology

## 2017-11-15 ENCOUNTER — Inpatient Hospital Stay: Payer: Managed Care, Other (non HMO) | Attending: Oncology | Admitting: Oncology

## 2017-11-15 DIAGNOSIS — I10 Essential (primary) hypertension: Secondary | ICD-10-CM

## 2017-11-15 DIAGNOSIS — Z79899 Other long term (current) drug therapy: Secondary | ICD-10-CM | POA: Insufficient documentation

## 2017-11-15 DIAGNOSIS — Z7984 Long term (current) use of oral hypoglycemic drugs: Secondary | ICD-10-CM | POA: Diagnosis not present

## 2017-11-15 DIAGNOSIS — Z7982 Long term (current) use of aspirin: Secondary | ICD-10-CM | POA: Diagnosis not present

## 2017-11-15 DIAGNOSIS — Z803 Family history of malignant neoplasm of breast: Secondary | ICD-10-CM | POA: Diagnosis not present

## 2017-11-15 DIAGNOSIS — Z87891 Personal history of nicotine dependence: Secondary | ICD-10-CM | POA: Insufficient documentation

## 2017-11-15 DIAGNOSIS — Z808 Family history of malignant neoplasm of other organs or systems: Secondary | ICD-10-CM | POA: Diagnosis not present

## 2017-11-15 DIAGNOSIS — E119 Type 2 diabetes mellitus without complications: Secondary | ICD-10-CM | POA: Diagnosis not present

## 2017-11-15 NOTE — Progress Notes (Signed)
Pt in for follow up.  Seen at [facility] earlier today, having parathyroid removed on November 8th.

## 2017-11-26 HISTORY — PX: PARATHYROIDECTOMY: SHX19

## 2017-12-03 ENCOUNTER — Ambulatory Visit: Payer: Managed Care, Other (non HMO) | Admitting: Family Medicine

## 2017-12-03 ENCOUNTER — Encounter: Payer: Self-pay | Admitting: Family Medicine

## 2017-12-03 VITALS — BP 130/70 | HR 100 | Temp 98.5°F | Resp 16 | Ht 67.0 in | Wt 258.8 lb

## 2017-12-03 DIAGNOSIS — E1129 Type 2 diabetes mellitus with other diabetic kidney complication: Secondary | ICD-10-CM | POA: Diagnosis not present

## 2017-12-03 DIAGNOSIS — E039 Hypothyroidism, unspecified: Secondary | ICD-10-CM | POA: Diagnosis not present

## 2017-12-03 DIAGNOSIS — Z8639 Personal history of other endocrine, nutritional and metabolic disease: Secondary | ICD-10-CM

## 2017-12-03 DIAGNOSIS — E892 Postprocedural hypoparathyroidism: Secondary | ICD-10-CM

## 2017-12-03 DIAGNOSIS — E2839 Other primary ovarian failure: Secondary | ICD-10-CM

## 2017-12-03 DIAGNOSIS — Z794 Long term (current) use of insulin: Secondary | ICD-10-CM | POA: Diagnosis not present

## 2017-12-03 DIAGNOSIS — Z9009 Acquired absence of other part of head and neck: Secondary | ICD-10-CM

## 2017-12-03 DIAGNOSIS — I10 Essential (primary) hypertension: Secondary | ICD-10-CM | POA: Diagnosis not present

## 2017-12-03 DIAGNOSIS — R809 Proteinuria, unspecified: Secondary | ICD-10-CM | POA: Diagnosis not present

## 2017-12-03 LAB — POCT GLYCOSYLATED HEMOGLOBIN (HGB A1C): HbA1c, POC (controlled diabetic range): 6.9 % (ref 0.0–7.0)

## 2017-12-03 NOTE — Progress Notes (Signed)
Name: Tabitha Shaw   MRN: [id number]    DOB: 04-17-1960   Date:12/03/2017       Progress Note  Subjective  Chief Complaint  Chief Complaint  Patient presents with  . Follow-up  . Hypothyroidism    HPI  DMII :she wasusing Bydureon andwastolerating well,  hgbA1C is 6.9% slightly higher than last visit, but had recent surgery .  She states glucose at home has been in the 130's fasting, she has not been checking levels post-prandially lately. She has been compliant with her diet, she was  going to the gym a couple of days for either zumba or cardio and [employer] but off for the past week because of surgery. Denies polyphagia, polydipsia or polyuria, but she has nocturia once per night. Taking benazepril for microalbuminuria. Last LDL not at goal, on statin therapy.  HTN: taking medication daily and denies side effects, she has not been checking her bp at home, no chest pain or palpitation or dizziness.Continue Lotrel 5/20. Unchanged   Hyperlipidemia: taking Atorvastatin, denies myalgia, she has been compliant since she started taking medication every morning, last LDL is still above goal at 104, she is still on Atorvastatin 40 mg discussed changing to Crestor and she is willing to try it now   Hypothyroidism: she is taking Synthroid96mcgdaily , denies constipation now just had to take stool softener after the surgery,  she has dry skin. TSH at goal , continue medications. Since recent surgery we will recheck level   Morbid obesity:: her weight has been stable since last year. Still on GLP-1 agonist, going to the gym, we will recheck labs.   Primary  Hyperparathyroidism: seen by Dr. Gabriel Carina and referred to Dr. Maudie Mercury, found to have a right lower parathyroid enlargement and had surgery on 11/26/2017, still has dressing and would like to have it removed, no cramps since surgery, no fever or chills at this time.   Hemochromatosis : sees Dr Grayland Ormond and has phlebotomy .  Unchanged    Patient Active Problem List   Diagnosis Date Noted  . Cervical high risk HPV (human papillomavirus) test positive 09/03/2016  . B12 deficiency 09/04/2015  . Elevated PTHrP level 09/04/2015  . Benign hypertension 09/12/2014  . Acanthosis nigricans 09/09/2014  . Diabetes mellitus with renal manifestation ([place]) 09/09/2014  . Dyslipidemia 09/09/2014  . Abnormal electrocardiogram 09/09/2014  . Enlarged kidney 09/09/2014  . 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[place]) 09/09/2014  . Enlarged liver 09/09/2014  . Calcium blood increased 09/09/2014  . Adult hypothyroidism 09/09/2014  . Microalbuminuria 09/09/2014  . Morbid obesity due to excess calories ([place]) 09/09/2014  . Spinal stenosis of lumbar region with neurogenic claudication 09/09/2014  . Osteopenia 09/09/2014  . Vitamin D deficiency 09/09/2014    Past Surgical History:  Procedure Laterality Date  . ABDOMINAL HYSTERECTOMY  2005  . blood transfusion yearly    . BREAST REDUCTION SURGERY    . CESAREAN SECTION    . CESAREAN SECTION  1992  . CHOLECYSTECTOMY  2000  . lapcholecystectomy    . PARATHYROIDECTOMY Right 11/26/2017   right lower , Dr. Maudie Mercury at [facility]  . REDUCTION MAMMAPLASTY Bilateral 1985  . VESICOVAGINAL FISTULA CLOSURE W/ TAH      Family History  Problem Relation Age of Onset  . Colon polyps Mother   . Hemachromatosis Mother   . Hypertension Mother   . Depression Sister   . Hypertension Brother   . Colon polyps Brother   . Cancer Father   . Breast cancer  Cousin   . Breast cancer Cousin     Social History   Socioeconomic History  . Marital status: Married    Spouse name: Tabitha Shaw   . Number of children: 1  . Years of education: Not on file  . Highest education level: Some college, no degree  Occupational History  . Occupation: customer services     Comment: [employer]  . Financial resource strain: Not hard at all  . Food insecurity:    Worry: Never true    Inability: Never true  .  Transportation needs:    Medical: No    Non-medical: No  Tobacco Use  . Smoking status: Former Smoker    Packs/day: 0.25    Years: 8.00    Pack years: 2.00    Types: Cigarettes    Start date: 01/19/1982    Last attempt to quit: 01/19/1990    Years since quitting: 27.8  . Smokeless tobacco: Never Used  Substance and Sexual Activity  . Alcohol use: Yes    Alcohol/week: 0.0 standard drinks    Comment: occasionally  . Drug use: No  . Sexual activity: Yes    Partners: Male  Lifestyle  . Physical activity:    Days per week: 3 days    Minutes per session: 60 min  . Stress: Not at all  Relationships  . Social connections:    Talks on phone: More than three times a week    Gets together: More than three times a week    Attends religious service: More than 4 times per year    Active member of club or organization: Yes    Attends meetings of clubs or organizations: More than 4 times per year    Relationship status: Married  . Intimate partner violence:    Fear of current or ex partner: No    Emotionally abused: No    Physically abused: No    Forced sexual activity: No  Other Topics Concern  . Not on file  Social History Narrative   Married; works  full time   Her daughter, Tabitha Shaw is a travel Marine scientist      Current Outpatient Medications:  .  amLODipine-benazepril (LOTREL) 5-20 MG capsule, TAKE 1 CAPSULE BY MOUTH  DAILY, Disp: 90 capsule, Rfl: 1 .  aspirin 81 MG EC tablet, Take 81 mg by mouth daily.  , Disp: , Rfl:  .  atorvastatin (LIPITOR) 40 MG tablet, TAKE 1 TABLET BY MOUTH  DAILY, Disp: 90 tablet, Rfl: 1 .  Exenatide ER (BYDUREON BCISE) 2 MG/0.85ML AUIJ, Inject 2 mg into the skin once a week., Disp: 12 pen, Rfl: 1 .  fluticasone (FLONASE) 50 MCG/ACT nasal spray, Place 2 sprays into both nostrils daily., Disp: 48 g, Rfl: 0 .  levothyroxine (SYNTHROID, LEVOTHROID) 75 MCG tablet, Take 1 tablet (75 mcg total) by mouth daily., Disp: 90 tablet, Rfl: 1 .  metFORMIN (GLUCOPHAGE-XR) 750  MG 24 hr tablet, Take 1 tablet (750 mg total) by mouth daily with breakfast., Disp: 90 tablet, Rfl: 1 .  ONE TOUCH ULTRA TEST test strip, Check fsbs twice week E11.29, Disp: 100 each, Rfl: 2 .  Vitamin D, Ergocalciferol, (DRISDOL) 50000 units CAPS capsule, Take 1 capsule (50,000 Units total) by mouth every 30 (thirty) days. (Patient taking differently: Take 50,000 Units by mouth every 30 (thirty) days. Take 2 every week), Disp: 12 capsule, Rfl: 0 .  oxyCODONE (OXY IR/ROXICODONE) 5 MG immediate release tablet, Take by mouth., Disp: ,  Rfl:   No Known Allergies  I personally reviewed active problem list, medication list, allergies, family history, social history with the patient/caregiver today.   ROS  Constitutional: Negative for fever or weight change.  Respiratory: Negative for cough and shortness of breath.   Cardiovascular: Negative for chest pain or palpitations.  Gastrointestinal: Negative for abdominal pain, no bowel changes.  Musculoskeletal: Negative for gait problem or joint swelling.  Skin: Negative for rash.  Neurological: Negative for dizziness or headache.  No other specific complaints in a complete review of systems (except as listed in HPI above).  Objective  Vitals:   12/03/17 0746  BP: 130/70  Pulse: 100  Resp: 16  Temp: 98.5 F (36.9 C)  TempSrc: Oral  SpO2: 98%  Weight: 258 lb 12.8 oz (117.4 kg)  Height: 5\' 7"  (1.702 m)    Body mass index is 40.53 kg/m.  Physical Exam  Constitutional: Patient appears well-developed and well-nourished. Obese  No distress.  HEENT: head atraumatic, normocephalic, pupils equal and reactive to light, neck supple, dressing on anterior neck  throat within normal limits Cardiovascular: Normal rate, regular rhythm and normal heart sounds.  No murmur heard. No BLE edema. Pulmonary/Chest: Effort normal and breath sounds normal. No respiratory distress. Abdominal: Soft.  There is no tenderness. Psychiatric: Patient has a normal  mood and affect. behavior is normal. Judgment and thought content normal.  PHQ2/9: Depression screen [facility] 2/9 12/03/2017 08/31/2017 02/02/2017 08/28/2016 04/10/2016  Decreased Interest 0 0 0 0 0  Down, Depressed, Hopeless 0 0 0 0 0  PHQ - 2 Score 0 0 0 0 0  Altered sleeping 0 - - - -  Tired, decreased energy 0 - - - -  Change in appetite 0 - - - -  Feeling bad or failure about yourself  0 - - - -  Trouble concentrating 0 - - - -  Moving slowly or fidgety/restless 0 - - - -  PHQ-9 Score 0 - - - -  Difficult doing work/chores Not difficult at all - - - -     Fall Risk: Fall Risk  12/03/2017 05/17/2017 02/02/2017 08/28/2016 04/10/2016  Falls in the past year? 0 No No No No    Assessment & Plan  1. Adult hypothyroidism  - TSH  2. Benign hypertension  BP is at goal   3. Morbid obesity due to excess calories [facility])  Discussed with the patient the risk posed by an increased BMI. Discussed importance of portion control, calorie counting and at least 150 minutes of physical activity weekly. Avoid sweet beverages and drink more water. Eat at least 6 servings of fruit and vegetables daily    4. Type 2 diabetes mellitus with microalbuminuria, with long-term current use of insulin (HCC)  - POCT HgB A1C  5. History of parathyroidectomy  - VITAMIN D 25 Hydroxy (Vit-D Deficiency, Fractures) - PTH, Intact and Calcium - DG Bone Density; Future  6. History of primary hyperparathyroidism  - VITAMIN D 25 Hydroxy (Vit-D Deficiency, Fractures) - PTH, Intact and Calcium - DG Bone Density; Future  7. Ovarian failure  - DG Bone Density; Future

## 2017-12-25 ENCOUNTER — Other Ambulatory Visit: Payer: Self-pay | Admitting: Family Medicine

## 2017-12-25 DIAGNOSIS — R809 Proteinuria, unspecified: Principal | ICD-10-CM

## 2017-12-25 DIAGNOSIS — Z794 Long term (current) use of insulin: Principal | ICD-10-CM

## 2017-12-25 DIAGNOSIS — E1129 Type 2 diabetes mellitus with other diabetic kidney complication: Secondary | ICD-10-CM

## 2018-01-10 ENCOUNTER — Telehealth: Payer: Self-pay

## 2018-01-10 NOTE — Telephone Encounter (Signed)
Prior Authorization was approved for Bydureon through 01/08/2019.

## 2018-01-16 ENCOUNTER — Other Ambulatory Visit: Payer: Self-pay | Admitting: Family Medicine

## 2018-01-16 DIAGNOSIS — I10 Essential (primary) hypertension: Secondary | ICD-10-CM

## 2018-02-08 ENCOUNTER — Ambulatory Visit
Admission: RE | Admit: 2018-02-08 | Discharge: 2018-02-08 | Disposition: A | Discharge status: Home / Self Care | Payer: Managed Care, Other (non HMO) | Source: Ambulatory Visit | Attending: Family Medicine | Admitting: Family Medicine

## 2018-02-08 DIAGNOSIS — Z8639 Personal history of other endocrine, nutritional and metabolic disease: Secondary | ICD-10-CM | POA: Diagnosis present

## 2018-02-08 DIAGNOSIS — Z9009 Acquired absence of other part of head and neck: Secondary | ICD-10-CM | POA: Insufficient documentation

## 2018-02-08 DIAGNOSIS — E2839 Other primary ovarian failure: Secondary | ICD-10-CM | POA: Diagnosis present

## 2018-02-14 ENCOUNTER — Inpatient Hospital Stay: Payer: Managed Care, Other (non HMO)

## 2018-03-08 ENCOUNTER — Ambulatory Visit: Payer: Managed Care, Other (non HMO) | Admitting: Family Medicine

## 2018-03-21 ENCOUNTER — Encounter: Payer: Self-pay | Admitting: Nurse Practitioner

## 2018-03-21 ENCOUNTER — Ambulatory Visit: Payer: Managed Care, Other (non HMO) | Admitting: Nurse Practitioner

## 2018-03-21 VITALS — BP 122/74 | HR 97 | Temp 98.7°F | Resp 14 | Ht 67.0 in | Wt 261.0 lb

## 2018-03-21 DIAGNOSIS — R809 Proteinuria, unspecified: Secondary | ICD-10-CM

## 2018-03-21 DIAGNOSIS — E1129 Type 2 diabetes mellitus with other diabetic kidney complication: Secondary | ICD-10-CM | POA: Diagnosis not present

## 2018-03-21 DIAGNOSIS — Z794 Long term (current) use of insulin: Secondary | ICD-10-CM | POA: Diagnosis not present

## 2018-03-21 DIAGNOSIS — J069 Acute upper respiratory infection, unspecified: Secondary | ICD-10-CM | POA: Diagnosis not present

## 2018-03-21 MED ORDER — BENZONATATE 200 MG PO CAPS: 200.0000 mg | ORAL_CAPSULE | Freq: Three times a day (TID) | ORAL | 0 refills | Status: DC | PRN

## 2018-03-21 MED ORDER — LORATADINE 10 MG PO TABS: 10.0000 mg | ORAL_TABLET | Freq: Every day | ORAL | 0 refills | Status: DC

## 2018-03-21 MED ORDER — EXENATIDE ER 2 MG/0.85ML ~~LOC~~ AUIJ: 2.0000 mg | AUTO-INJECTOR | SUBCUTANEOUS | 0 refills | Status: DC

## 2018-03-21 NOTE — Patient Instructions (Signed)
-   If your symptoms worsen within the next week please call/message and let me know and I will send you in something - Take mucinex twice a day, cough medicine rx up to three times a day as needed, take claritin to dry you out and take flonase 2 sprays each nostril daily for the next 4 days and then just as needed.   You likely have a viral upper respiratory infection (URI). Antibiotics will not reduce the number of days you are ill or prevent you from getting bacterial rhinosinusitis. A URI can take up to 14 days to resolve, but typically last between 7-11 days. Your body is so smart and strong that it will be fighting this illness off for you but it is important that you drink plenty of fluids, rest. Cover your nose/mouth when you cough or sneeze and wash your hands well and often. Here are some helpful things you can use or pick up over the counter from the pharmacy to help with your symptoms:   For Fever/Pain: Acetaminophen every 6 hours as needed (maximum of 3000mg  a day). If you are still uncomfortable you can add ibuprofen OR naproxen  For coughing: try dextromethorphan for a cough suppressant, and/or a cool mist humidifier, lozenges  For sore throat: saline gargles, honey herbal tea, lozenges, throat spray  To dry out your nose: try an antihistamine like loratadine (non-sedating) or diphenhydramine (sedating) or others To relieve a stuffy nose: try flonase neti pot To make blowing your nose easier and relieve chest congestion: guaifenesin 400mg  every 4-6 hours of guaifenesin ER [phone] mg every 12 hours. Do not take more than 2,400mg  a day.   Try to use PLAIN allergy medicine without the decongestant. Avoid: phenylephrine, phenylpropanolamine, and pseudoephredine

## 2018-03-21 NOTE — Progress Notes (Addendum)
Name: Tabitha Shaw   MRN: [id number]    DOB: Apr 05, 1960   Date:05/11/2018       Progress Note  Subjective  Chief Complaint  Chief Complaint  Patient presents with  . URI    patient has been having sx since 03/10/18. productive cough, sneezinf, r maxiallry sinus pain when touched, some sore throat. otc mucinex and coricidan.    HPI  Patient presents to clinic with illness ongoing for 11 days. First symptom was sore throat then sneezing, coughing, low grade fever early on. Onset was progressive states today is feeling better then before.  Cough: dry; weak, worse at night Has had some popping sounds, and muffled sounds,  Nasal congestion, bilateral facial pain, decreased sense of smell.  Sore throat has resolved.   denies dysphagia, voice change, shortness of breath, stridor, myalgias, malaise, headache. Has taken mucinex, coricidon with some relief    Patient Active Problem List   Diagnosis Date Noted  . Cervical high risk HPV (human papillomavirus) test positive 09/03/2016  . B12 deficiency 09/04/2015  . Elevated PTHrP level 09/04/2015  . Benign hypertension 09/12/2014  . Acanthosis nigricans 09/09/2014  . Diabetes mellitus with renal manifestation ([place]) 09/09/2014  . Dyslipidemia 09/09/2014  . Abnormal electrocardiogram 09/09/2014  . Enlarged kidney 09/09/2014  . 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[place]) 09/09/2014  . Enlarged liver 09/09/2014  . Calcium blood increased 09/09/2014  . Adult hypothyroidism 09/09/2014  . Microalbuminuria 09/09/2014  . Morbid obesity due to excess calories ([place]) 09/09/2014  . Spinal stenosis of lumbar region with neurogenic claudication 09/09/2014  . Osteopenia 09/09/2014  . Vitamin D deficiency 09/09/2014    Past Medical History:  Diagnosis Date  . Acquired acanthosis nigricans   . Diabetes type 2, controlled ([place])    if controlled was not specified  . Hemochromatosis   . Hepatomegaly   . HLD (hyperlipidemia)   . HTN (hypertension)   .  Hyperlipidemia   . Hypertension   . Hypertrophy of kidney   . Hypothyroidism   . Intertrigo   . Obesity   . Osteopenia   . Postinflammatory hyperpigmentation   . Snoring   . Thyroid disease   . Vitamin D deficiency     Past Surgical History:  Procedure Laterality Date  . ABDOMINAL HYSTERECTOMY  2005  . blood transfusion yearly    . BREAST REDUCTION SURGERY    . CESAREAN SECTION    . CESAREAN SECTION  1992  . CHOLECYSTECTOMY  2000  . lapcholecystectomy    . PARATHYROIDECTOMY Right 11/26/2017   right lower , [facility] Maudie Mercury [facility]  . REDUCTION MAMMAPLASTY Bilateral 1985  . VESICOVAGINAL FISTULA CLOSURE W/ TAH      Social History   Tobacco Use  . Smoking status: Former Smoker    Packs/day: 0.25    Years: 8.00    Pack years: 2.00    Types: Cigarettes    Start date: 01/19/1982    Last attempt to quit: 01/19/1990    Years since quitting: 28.3  . Smokeless tobacco: Never Used  Substance Use Topics  . Alcohol use: Yes    Alcohol/week: 0.0 standard drinks    Comment: occasionally     Current Outpatient Medications:  .  amLODipine-benazepril (LOTREL) 5-20 MG capsule, TAKE 1 CAPSULE BY MOUTH  DAILY, Disp: 90 capsule, Rfl: 1 .  aspirin 81 MG EC tablet, Take 81 mg by mouth daily.  , Disp: , Rfl:  .  atorvastatin (LIPITOR) 40 MG tablet, TAKE 1 TABLET BY MOUTH  DAILY, Disp: 90 tablet, Rfl: 1 .  Exenatide ER (BYDUREON BCISE) 2 MG/0.85ML AUIJ, Inject 2 mg into the skin once a week., Disp: 12 pen, Rfl: 0 .  fluticasone (FLONASE) 50 MCG/ACT nasal spray, Place 2 sprays into both nostrils daily., Disp: 48 g, Rfl: 0 .  metFORMIN (GLUCOPHAGE-XR) 750 MG 24 hr tablet, TAKE 1 TABLET BY MOUTH  DAILY WITH BREAKFAST, Disp: 90 tablet, Rfl: 1 .  ONE TOUCH ULTRA TEST test strip, CHECK FASTING BLOOD SUGAR  TWICE WEEKLY, Disp: 100 each, Rfl: 1 .  Vitamin D, Ergocalciferol, (DRISDOL) 50000 units CAPS capsule, Take 1 capsule (50,000 Units total) by mouth every 30 (thirty) days. (Patient taking differently:  Take 50,000 Units by mouth every 30 (thirty) days. Take 2 every week), Disp: 12 capsule, Rfl: 0 .  benzonatate (TESSALON) 200 MG capsule, Take 1 capsule (200 mg total) by mouth 3 (three) times daily as needed for cough., Disp: 30 capsule, Rfl: 0 .  levothyroxine (SYNTHROID, LEVOTHROID) 75 MCG tablet, Take 1 tablet (75 mcg total) by mouth daily., Disp: 90 tablet, Rfl: 1 .  loratadine (CLARITIN) 10 MG tablet, Take 1 tablet (10 mg total) by mouth daily., Disp: 30 tablet, Rfl: 0  No Known Allergies  ROS    No other specific complaints in a complete review of systems (except as listed in HPI above).  Objective  Vitals:   03/21/18 1446  BP: 122/74  Pulse: 97  Resp: 14  Temp: 98.7 F (37.1 C)  TempSrc: Oral  SpO2: 98%  Weight: 261 lb (118.4 kg)  Height: 5\' 7"  (1.702 m)     Body mass index is 40.88 kg/m.  Nursing Note and Vital Signs reviewed.  Physical Exam HENT:     Head: Normocephalic and atraumatic.     Right Ear: Hearing, tympanic membrane, ear canal and external ear normal.     Left Ear: Hearing, ear canal and external ear normal. A middle ear effusion is present.     Nose:     Right Sinus: Maxillary sinus tenderness present. No frontal sinus tenderness.     Left Sinus: Maxillary sinus tenderness present. No frontal sinus tenderness.     Comments: Mild bilateral maxillary tenderness     Mouth/Throat:     Mouth: Mucous membranes are moist.     Pharynx: Uvula midline. No oropharyngeal exudate or posterior oropharyngeal erythema.  Eyes:     General:        Right eye: No discharge.        Left eye: No discharge.     Conjunctiva/sclera: Conjunctivae normal.  Neck:     Musculoskeletal: Normal range of motion.  Cardiovascular:     Rate and Rhythm: Normal rate.  Pulmonary:     Effort: Pulmonary effort is normal.     Breath sounds: Normal breath sounds.  Lymphadenopathy:     Cervical: No cervical adenopathy.  Skin:    General: Skin is warm and dry.     Findings: No  rash.  Neurological:     Mental Status: She is alert.  Psychiatric:        Judgment: Judgment normal.    Diabetic Foot Exam - Simple   Simple Foot Form Diabetic Foot exam was performed with the following findings:  Yes 03/21/2018  3:14 PM  Visual Inspection No deformities, no ulcerations, no other skin breakdown bilaterally:  Yes Sensation Testing Intact to touch and monofilament testing bilaterally:  Yes Pulse Check Posterior Tibialis and Dorsalis pulse intact bilaterally:  Yes  Comments      No results found for this or any previous visit (from the past 48 hour(s)).  Assessment & Plan  1. Viral upper respiratory tract infection As symptoms are improving hold off on antibiotic therapy- discussed if secondary worsening or further lack of improvement let us know.  - benzonatate (TESSALON) 200 MG capsule; Take 1 capsule (200 mg total) by mouth 3 (three) times daily as needed for cough.  Dispense: 30 capsule; Refill: 0 - loratadine (CLARITIN) 10 MG tablet; Take 1 tablet (10 mg total) by mouth daily.  Dispense: 30 tablet; Refill: 0  2. Type 2 diabetes mellitus with microalbuminuria, with long-term current use of insulin (H[facility]) Foot exam completed.  - Exenatide ER (BYDUREON BCISE) 2 MG/0.85ML AUIJ; Inject 2 mg into the skin once a week.  Dispense: 12 pen; Refill: 0

## 2018-04-11 ENCOUNTER — Ambulatory Visit: Payer: Managed Care, Other (non HMO) | Admitting: Family Medicine

## 2018-04-20 ENCOUNTER — Telehealth: Payer: Self-pay | Admitting: *Deleted

## 2018-04-20 NOTE — Telephone Encounter (Signed)
I called her back and told her to have blood work done at [facility] and then call back to let team know that it was done and they will look out for lab results and then tell the patient what kindof appt she may need based on the labs. She states that another girl called her today. She will get it done

## 2018-04-20 NOTE — Telephone Encounter (Signed)
-----   Message from Lloyd Huger, MD sent at 04/19/2018  2:59 PM EDT ----- Have her get her labs at [facility], then we can call her when we see the results as too what to do.    ----- Message ----- From: Luella Cook, RN Sent: 04/19/2018   1:26 PM EDT To: Lloyd Huger, MD, [place]ene Dales, #  This lady I was talking to because she works at [employer]. Then she asked me about her appt. She says she usually comes about every 3 months and she missed an appt. She has an appt 4/27 to see md and one blood. I did tell her that so far the one blood has been cancelling since the covid 19 virus. I told her I would send the provider and his team a message about her and request that she get a call back .  Cell [phone].   Thanks [employer]

## 2018-04-24 ENCOUNTER — Encounter: Payer: Self-pay | Admitting: Family Medicine

## 2018-04-25 ENCOUNTER — Other Ambulatory Visit: Payer: Self-pay

## 2018-04-25 ENCOUNTER — Other Ambulatory Visit: Payer: Self-pay | Admitting: Family Medicine

## 2018-04-25 DIAGNOSIS — E039 Hypothyroidism, unspecified: Secondary | ICD-10-CM

## 2018-04-25 MED ORDER — LEVOTHYROXINE SODIUM 75 MCG PO TABS: 75.0000 ug | ORAL_TABLET | Freq: Every day | ORAL | 1 refills | Status: DC

## 2018-04-27 ENCOUNTER — Encounter: Payer: Self-pay | Admitting: Oncology

## 2018-05-10 ENCOUNTER — Other Ambulatory Visit: Payer: Self-pay | Admitting: Nurse Practitioner

## 2018-05-10 DIAGNOSIS — R809 Proteinuria, unspecified: Principal | ICD-10-CM

## 2018-05-10 DIAGNOSIS — Z794 Long term (current) use of insulin: Principal | ICD-10-CM

## 2018-05-10 DIAGNOSIS — E1129 Type 2 diabetes mellitus with other diabetic kidney complication: Secondary | ICD-10-CM

## 2018-05-11 NOTE — Telephone Encounter (Signed)
Can you see if she will do a virtual visit for chronic disease management. Last appointment in march was for acute issue.

## 2018-05-14 ENCOUNTER — Other Ambulatory Visit: Payer: Self-pay | Admitting: Family Medicine

## 2018-05-14 DIAGNOSIS — E1129 Type 2 diabetes mellitus with other diabetic kidney complication: Secondary | ICD-10-CM

## 2018-05-14 DIAGNOSIS — Z794 Long term (current) use of insulin: Principal | ICD-10-CM

## 2018-05-14 DIAGNOSIS — R809 Proteinuria, unspecified: Principal | ICD-10-CM

## 2018-05-16 ENCOUNTER — Ambulatory Visit: Payer: Managed Care, Other (non HMO) | Admitting: Oncology

## 2018-06-06 LAB — HM DIABETES EYE EXAM

## 2018-06-07 ENCOUNTER — Encounter: Payer: Self-pay | Admitting: Family Medicine

## 2018-06-23 ENCOUNTER — Telehealth: Payer: Self-pay | Admitting: *Deleted

## 2018-06-23 NOTE — Telephone Encounter (Signed)
Called patient and let her know that I would leave her [facility] order at the front desk.

## 2018-06-23 NOTE — Telephone Encounter (Signed)
Would like updated labs.

## 2018-06-23 NOTE — Telephone Encounter (Signed)
Patient has an appointment with Dr Grayland Ormond next week and she is asking if she needs labs repeated or if he will use labs from April. She gets labs drawn at [facility] so if she needs more lab drawn, she will need an order form and will pick it up if you call her. Please advise

## 2018-06-26 NOTE — Progress Notes (Signed)
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  INTERVAL HISTORY: Patient returns to clinic today for repeat laboratory work and consideration of phlebotomy.  She continues to feel well and remains asymptomatic. She has no neurologic complaints.  She denies any recent fevers or illnesses.  She denies any chest pain, shortness of breath, cough, or hemoptysis.  She denies any weakness or fatigue.  She has a good appetite and denies weight loss. She denies any nausea, vomiting, constipation, or diarrhea. She has no urinary complaints.  Patient feels at her baseline offers no specific complaints today.  REVIEW OF SYSTEMS:   Review of Systems  Constitutional: Negative.  Negative for fever, malaise/fatigue and weight loss.  Respiratory: Negative.  Negative for cough and shortness of breath.   Cardiovascular: Negative.  Negative for chest pain and leg swelling.  Gastrointestinal: Negative.  Negative for abdominal pain, blood in stool and melena.  Genitourinary: Negative.  Negative for dysuria.  Musculoskeletal: Negative.  Negative for back pain.  Skin: Negative.  Negative for rash.  Neurological: Negative.  Negative for sensory change, focal weakness and weakness.  Psychiatric/Behavioral: Negative.  The patient is not nervous/anxious.     As per HPI. Otherwise, a complete review of systems is negative.  PAST MEDICAL HISTORY: Past Medical History:  Diagnosis Date  . Acquired acanthosis nigricans   . Diabetes type 2, controlled ([place])    if controlled was not specified  .  Hemochromatosis   . Hepatomegaly   . HLD (hyperlipidemia)   . HTN (hypertension)   . Hyperlipidemia   . Hypertension   . Hypertrophy of kidney   . Hypothyroidism   . Intertrigo   . Obesity   . Osteopenia   . Postinflammatory hyperpigmentation   . Snoring   . Thyroid disease   . Vitamin D deficiency     PAST SURGICAL HISTORY: Past Surgical History:  Procedure Laterality Date  . ABDOMINAL HYSTERECTOMY  2005  . blood transfusion yearly    . BREAST REDUCTION SURGERY    . CESAREAN SECTION    . CESAREAN SECTION  1992  . CHOLECYSTECTOMY  2000  . lapcholecystectomy    . PARATHYROIDECTOMY Right 11/26/2017   right lower , Dr. Maudie Mercury at [facility]  . REDUCTION MAMMAPLASTY Bilateral 1985  . VESICOVAGINAL FISTULA CLOSURE W/ TAH      FAMILY HISTORY Family History  Problem Relation Age of Onset  . Colon polyps Mother   . Hemachromatosis Mother   . Hypertension Mother   . Depression Sister   . Hypertension Brother   . Colon polyps Brother   . Cancer Father   . Breast cancer Cousin   . Breast cancer Cousin        ADVANCED DIRECTIVES:    HEALTH MAINTENANCE: Social History   Tobacco Use  . Smoking status: Former Smoker    Packs/day: 0.25    Years: 8.00    Pack years: 2.00    Types: Cigarettes    Start date: 01/19/1982    Last attempt to quit: 01/19/1990    Years since quitting: 28.4  . Smokeless tobacco: Never Used  Substance Use Topics  . Alcohol use:  Yes    Alcohol/week: 0.0 standard drinks    Comment: occasionally  . Drug use: No     Colonoscopy:  PAP:  Bone density:  Lipid panel:  No Known Allergies  Current Outpatient Medications  Medication Sig Dispense Refill  . amLODipine-benazepril (LOTREL) 5-20 MG capsule TAKE 1 CAPSULE BY MOUTH  DAILY 90 capsule 1  . aspirin 81 MG EC tablet Take 81 mg by mouth daily.      Marland Kitchen atorvastatin (LIPITOR) 40 MG tablet TAKE 1 TABLET BY MOUTH  DAILY 90 tablet 1  . BYDUREON BCISE 2 MG/0.85ML AUIJ INJECT SUBCUTANEOUSLY 2MG   ONCE A WEEK  10.2 mL 1  . fluticasone (FLONASE) 50 MCG/ACT nasal spray Place 2 sprays into both nostrils daily. 48 g 0  . levothyroxine (SYNTHROID, LEVOTHROID) 75 MCG tablet Take 1 tablet (75 mcg total) by mouth daily. 90 tablet 1  . loratadine (CLARITIN) 10 MG tablet Take 1 tablet (10 mg total) by mouth daily. 30 tablet 0  . metFORMIN (GLUCOPHAGE-XR) 750 MG 24 hr tablet TAKE 1 TABLET BY MOUTH  DAILY WITH BREAKFAST 90 tablet 0  . ONE TOUCH ULTRA TEST test strip CHECK FASTING BLOOD SUGAR  TWICE WEEKLY 100 each 1  . Vitamin D, Ergocalciferol, (DRISDOL) 50000 units CAPS capsule Take 1 capsule (50,000 Units total) by mouth every 30 (thirty) days. (Patient taking differently: Take 50,000 Units by mouth every 30 (thirty) days. Take 2 every week) 12 capsule 0   No current facility-administered medications for this visit.     OBJECTIVE: Vitals:   06/28/18 1429  BP: 121/82  Pulse: 77  Temp: 98.7 F (37.1 C)     Body mass index is 40.52 kg/m.    ECOG FS:0 - Asymptomatic  General: Well-developed, well-nourished, no acute distress. Eyes: Pink conjunctiva, anicteric sclera. HEENT: Normocephalic, moist mucous membranes. Lungs: Clear to auscultation bilaterally. Heart: Regular rate and rhythm. No rubs, murmurs, or gallops. Abdomen: Soft, nontender, nondistended. No organomegaly noted, normoactive bowel sounds. Musculoskeletal: No edema, cyanosis, or clubbing. Neuro: Alert, answering all questions appropriately. Cranial nerves grossly intact. Skin: No rashes or petechiae noted. Psych: Normal affect.  LAB RESULTS:  Lab Results  Component Value Date   NA 143 08/26/2017   K 4.4 08/26/2017   CL 103 08/26/2017   CO2 26 05/13/2017   GLUCOSE 116 (H) 08/26/2017   BUN 8 08/26/2017   CREATININE 0.67 08/26/2017   CALCIUM 10.2 08/26/2017   PROT 6.9 08/26/2017   ALBUMIN 4.4 08/26/2017   AST 60 (H) 08/26/2017   ALT 54 (H) 05/13/2017   ALKPHOS 104 08/26/2017   BILITOT 0.8 08/26/2017   GFRNONAA 99 08/26/2017    GFRAA 114 08/26/2017    Lab Results  Component Value Date   WBC 7.5 08/26/2017   NEUTROABS 3.7 08/26/2017   HGB 14.9 08/26/2017   HCT 42.4 08/26/2017   MCV 102 (H) 08/26/2017   PLT 253 08/26/2017     STUDIES: No results found.  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  PLAN:    1.  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  Hemoglobin is stable at 15.0.  All of her other laboratory work continues to be within normal limits.  Goal ferritin will be between 50 and 100.  Proceed with 500 mL phlebotomy today.  Return to clinic in 3 months for phlebotomy only and then in 6 months with repeat laboratory work, further evaluation, and continuation of treatment. Patient continues  to get all of her laboratory work at [facility].   2.  Parathyroid surgery: Continue treatment and follow-up at [facility].  I spent a total of 30 minutes face-to-face with the patient of which greater than 50% of the visit was spent in counseling and coordination of care as detailed above.   Patient expressed understanding and was in agreement with this plan. She also understands that She can call clinic at any time with any questions, concerns, or complaints.    Lloyd Huger, MD   06/29/2018 7:11 AM

## 2018-06-27 ENCOUNTER — Encounter: Payer: Self-pay | Admitting: Oncology

## 2018-06-28 ENCOUNTER — Inpatient Hospital Stay: Payer: Managed Care, Other (non HMO) | Attending: Oncology | Admitting: Oncology

## 2018-06-28 ENCOUNTER — Encounter: Payer: Self-pay | Admitting: Oncology

## 2018-06-28 ENCOUNTER — Other Ambulatory Visit: Payer: Self-pay

## 2018-06-28 ENCOUNTER — Inpatient Hospital Stay: Payer: Managed Care, Other (non HMO)

## 2018-06-28 DIAGNOSIS — Z87891 Personal history of nicotine dependence: Secondary | ICD-10-CM | POA: Diagnosis not present

## 2018-06-28 DIAGNOSIS — E119 Type 2 diabetes mellitus without complications: Secondary | ICD-10-CM

## 2018-06-28 DIAGNOSIS — Z8371 Family history of colonic polyps: Secondary | ICD-10-CM

## 2018-06-28 DIAGNOSIS — E039 Hypothyroidism, unspecified: Secondary | ICD-10-CM

## 2018-06-28 DIAGNOSIS — Z8249 Family history of ischemic heart disease and other diseases of the circulatory system: Secondary | ICD-10-CM | POA: Diagnosis not present

## 2018-06-28 DIAGNOSIS — Z809 Family history of malignant neoplasm, unspecified: Secondary | ICD-10-CM

## 2018-06-28 DIAGNOSIS — Z79899 Other long term (current) drug therapy: Secondary | ICD-10-CM | POA: Insufficient documentation

## 2018-06-28 DIAGNOSIS — Z803 Family history of malignant neoplasm of breast: Secondary | ICD-10-CM

## 2018-06-28 DIAGNOSIS — Z818 Family history of other mental and behavioral disorders: Secondary | ICD-10-CM | POA: Diagnosis not present

## 2018-06-28 DIAGNOSIS — I10 Essential (primary) hypertension: Secondary | ICD-10-CM

## 2018-06-28 NOTE — Progress Notes (Signed)
Patient stated that she had been doing well. 

## 2018-07-06 ENCOUNTER — Other Ambulatory Visit: Payer: Self-pay | Admitting: Family Medicine

## 2018-07-06 DIAGNOSIS — I10 Essential (primary) hypertension: Secondary | ICD-10-CM

## 2018-07-19 ENCOUNTER — Other Ambulatory Visit: Payer: Self-pay | Admitting: Family Medicine

## 2018-08-05 ENCOUNTER — Encounter: Payer: Self-pay | Admitting: Family Medicine

## 2018-08-07 ENCOUNTER — Other Ambulatory Visit: Payer: Self-pay | Admitting: Family Medicine

## 2018-08-07 DIAGNOSIS — E1129 Type 2 diabetes mellitus with other diabetic kidney complication: Secondary | ICD-10-CM

## 2018-08-07 DIAGNOSIS — Z794 Long term (current) use of insulin: Secondary | ICD-10-CM

## 2018-08-09 ENCOUNTER — Encounter: Payer: Self-pay | Admitting: General Surgery

## 2018-08-12 ENCOUNTER — Other Ambulatory Visit: Payer: Self-pay | Admitting: Family Medicine

## 2018-08-12 DIAGNOSIS — Z01419 Encounter for gynecological examination (general) (routine) without abnormal findings: Secondary | ICD-10-CM

## 2018-08-19 ENCOUNTER — Other Ambulatory Visit: Payer: Self-pay | Admitting: Family Medicine

## 2018-08-19 DIAGNOSIS — Z1231 Encounter for screening mammogram for malignant neoplasm of breast: Secondary | ICD-10-CM

## 2018-08-23 ENCOUNTER — Ambulatory Visit
Admission: RE | Admit: 2018-08-23 | Discharge: 2018-08-23 | Disposition: A | Discharge status: Home / Self Care | Payer: Managed Care, Other (non HMO) | Source: Ambulatory Visit | Attending: Family Medicine | Admitting: Family Medicine

## 2018-08-23 ENCOUNTER — Other Ambulatory Visit: Payer: Self-pay

## 2018-08-23 DIAGNOSIS — Z1231 Encounter for screening mammogram for malignant neoplasm of breast: Secondary | ICD-10-CM | POA: Insufficient documentation

## 2018-08-23 IMAGING — MG DIGITAL SCREENING BILATERAL MAMMOGRAM WITH TOMO AND CAD
8 of 14 series · 8 of 40 positions shown · non-contrast
Comparison: Previous exam(s).

CLINICAL DATA: Screening.

EXAM:
DIGITAL SCREENING BILATERAL MAMMOGRAM WITH TOMO AND CAD

[L AT synth-2D]
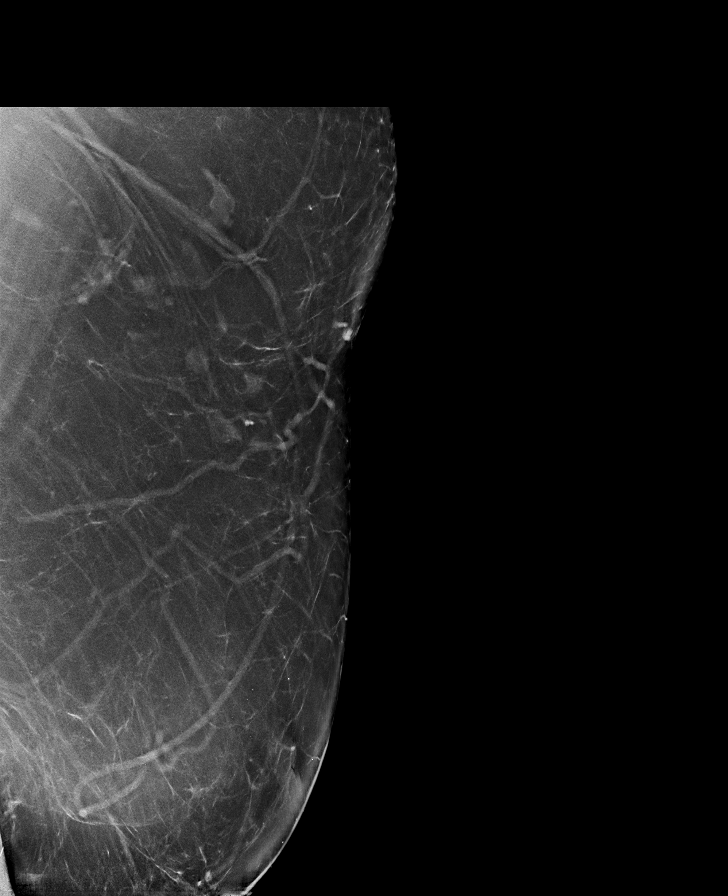

[L CC synth-2D (1 of 2)]
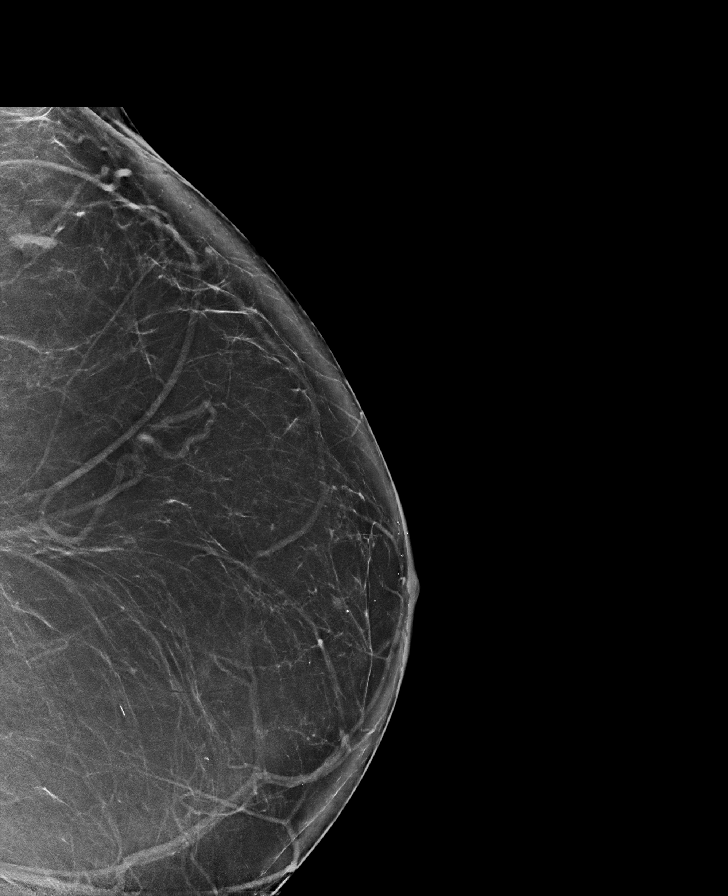

[L MLO synth-2D]
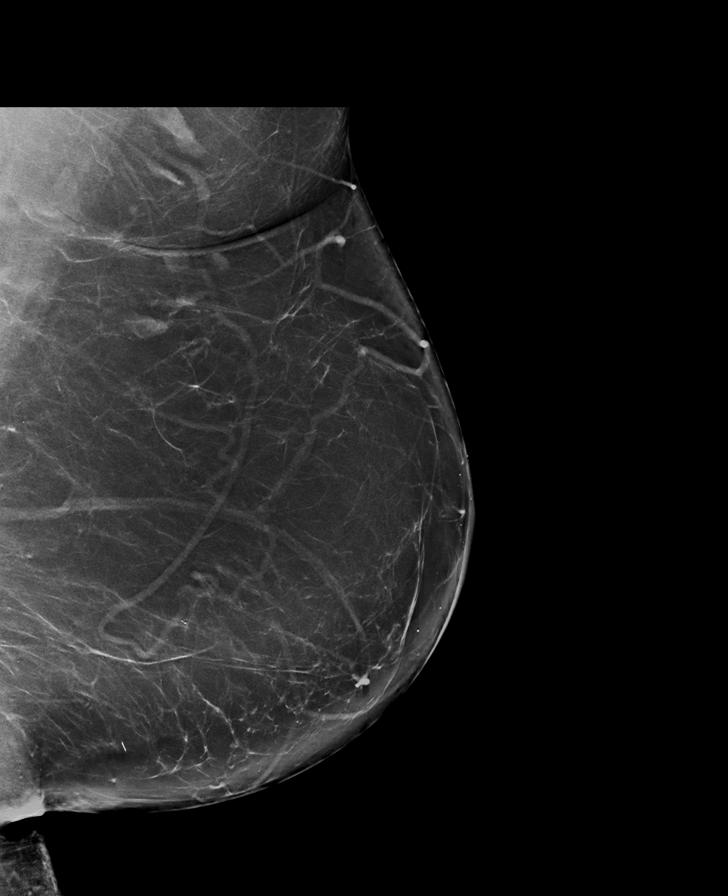

[L CC synth-2D (2 of 2)]
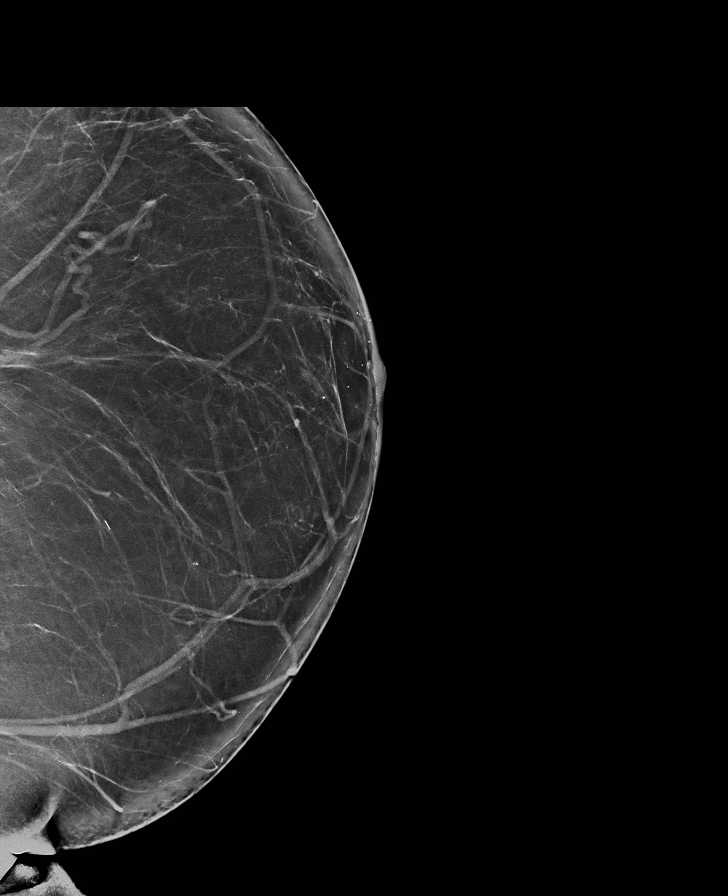

[R CC synth-2D (1 of 2)]
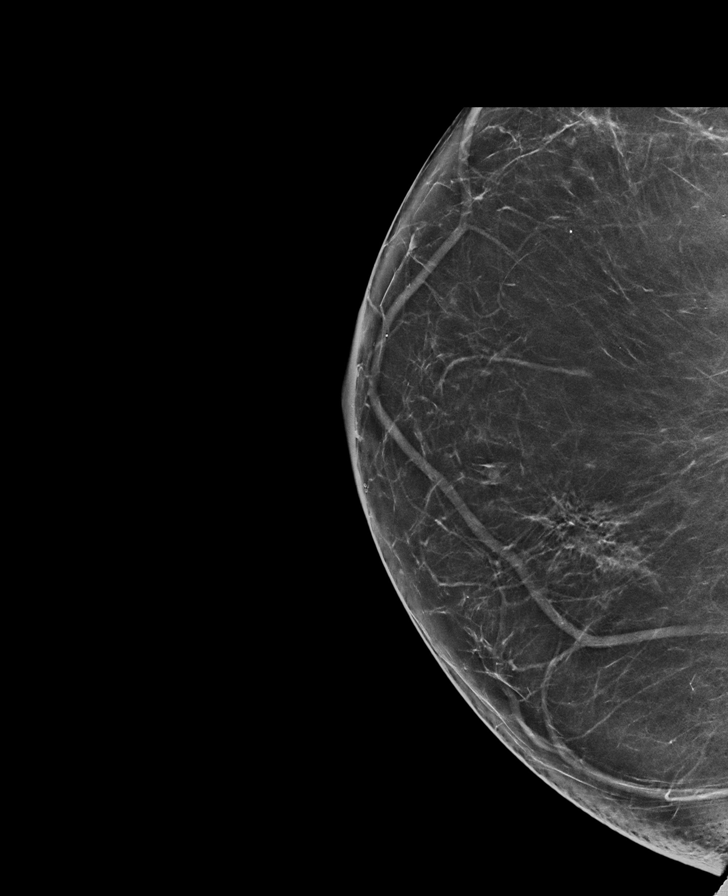

[R CC synth-2D (2 of 2)]
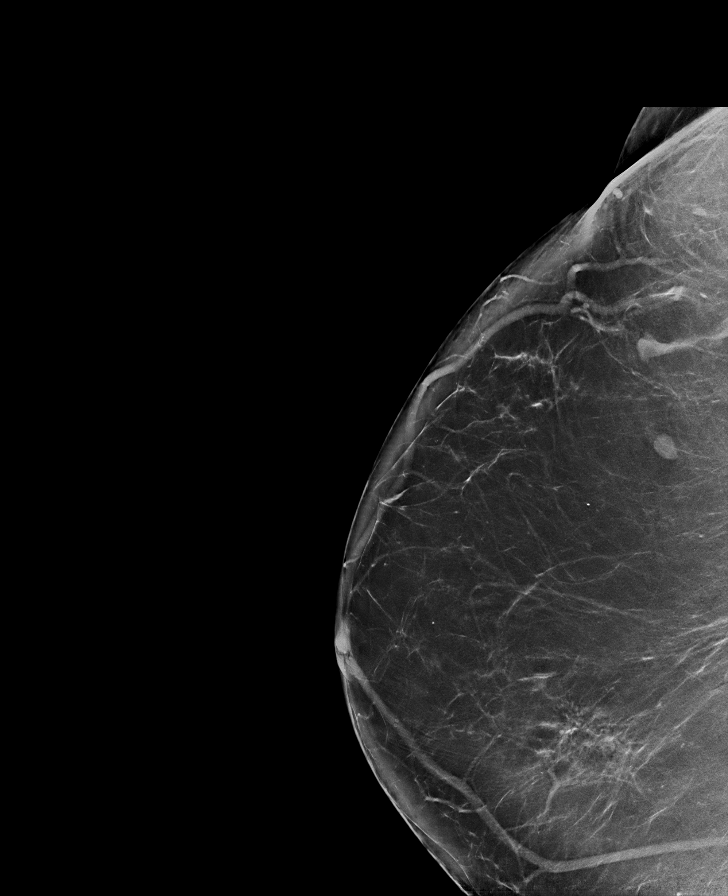

[R MLO synth-2D]
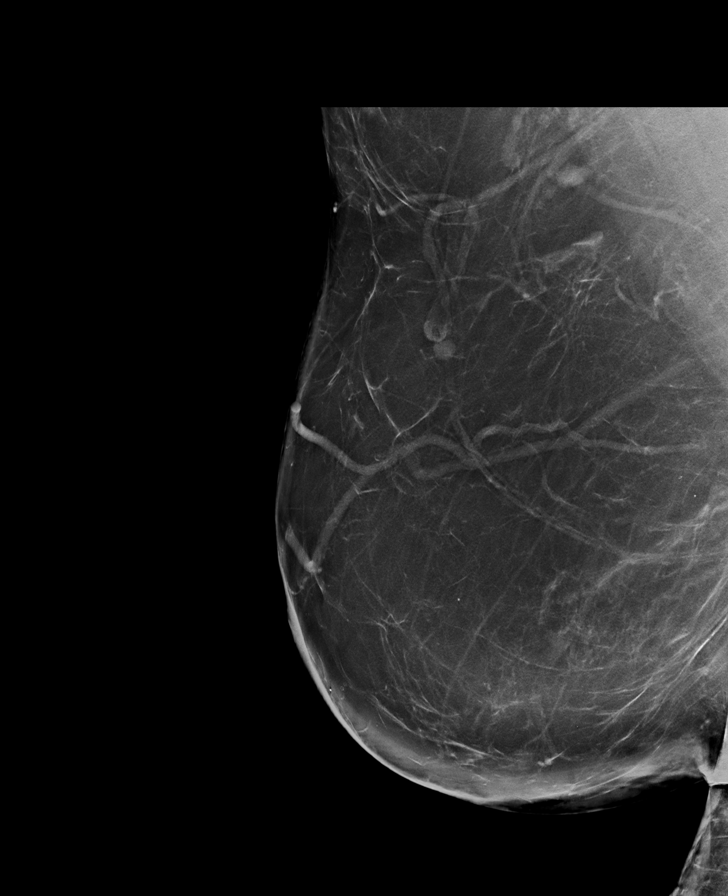

[R CC tomo · tomo slice 46/91.0]
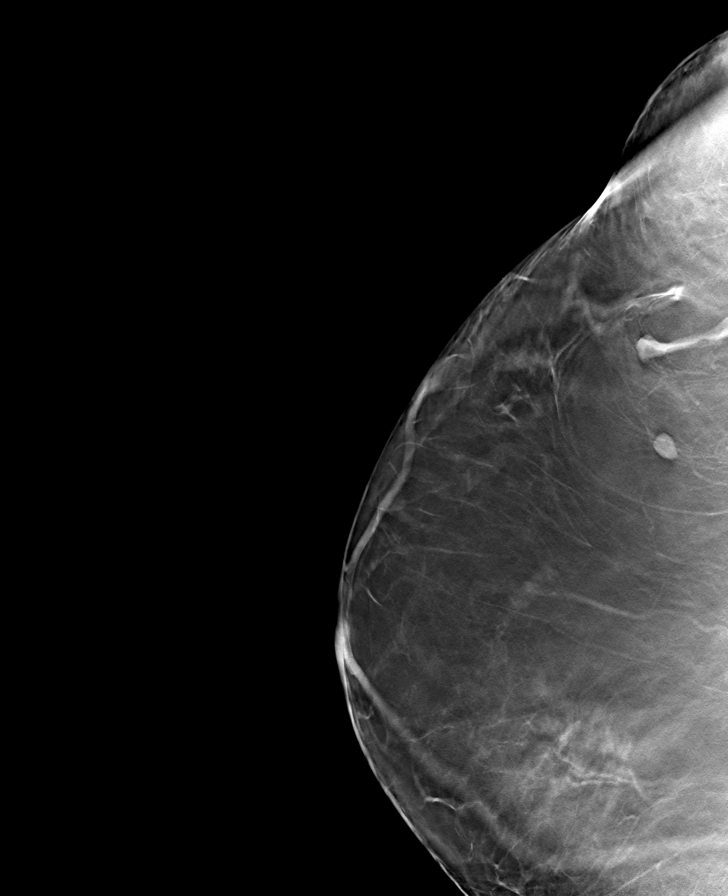

[8 of 40 positions shown; findings below may reference images not displayed]

ACR Breast Density Category b: There are scattered areas of
fibroglandular density.
FINDINGS: There are no findings suspicious for malignancy. Images were
processed with CAD.
IMPRESSION: No mammographic evidence of malignancy. A result letter of this
screening mammogram will be mailed directly to the patient.

RECOMMENDATION:
Screening mammogram in one year. (Code:[TQ])

BI-RADS CATEGORY  1: Negative.

## 2018-09-06 LAB — COMPREHENSIVE METABOLIC PANEL
ALT: 73 IU/L — ABNORMAL HIGH (ref 0–32)
AST: 68 IU/L — ABNORMAL HIGH (ref 0–40)
Albumin/Globulin Ratio: 1.7 (ref 1.2–2.2)
Albumin: 4.7 g/dL (ref 3.8–4.9)
Alkaline Phosphatase: 83 IU/L (ref 39–117)
BUN/Creatinine Ratio: 13 (ref 9–23)
BUN: 9 mg/dL (ref 6–24)
Bilirubin Total: 0.8 mg/dL (ref 0.0–1.2)
CO2: 23 mmol/L (ref 20–29)
Calcium: 9.7 mg/dL (ref 8.7–10.2)
Chloride: 98 mmol/L (ref 96–106)
Creatinine, Ser: 0.69 mg/dL (ref 0.57–1.00)
GFR calc Af Amer: 113 mL/min/{1.73_m2} (ref >59)
GFR calc non Af Amer: 98 mL/min/{1.73_m2} (ref >59)
Globulin, Total: 2.7 g/dL (ref 1.5–4.5)
Glucose: 174 mg/dL — ABNORMAL HIGH (ref 65–99)
Potassium: 4.3 mmol/L (ref 3.5–5.2)
Sodium: 138 mmol/L (ref 134–144)
Total Protein: 7.4 g/dL (ref 6.0–8.5)

## 2018-09-06 LAB — LIPID PANEL
Chol/HDL Ratio: 6 ratio — ABNORMAL HIGH (ref 0.0–4.4)
Cholesterol, Total: 253 mg/dL — ABNORMAL HIGH (ref 100–199)
HDL: 42 mg/dL (ref >39)
LDL Calculated: 162 mg/dL — ABNORMAL HIGH (ref 0–99)
Triglycerides: 246 mg/dL — ABNORMAL HIGH (ref 0–149)
VLDL Cholesterol Cal: 49 mg/dL — ABNORMAL HIGH (ref 5–40)

## 2018-09-06 LAB — HEMOGLOBIN A1C
Est. average glucose Bld gHb Est-mCnc: 186 mg/dL
Hgb A1c MFr Bld: 8.1 % — ABNORMAL HIGH (ref 4.8–5.6)

## 2018-09-06 LAB — SAR COV2 SEROLOGY (COVID19)AB(IGG),IA: SARS-CoV-2 Ab, IgG: NEGATIVE

## 2018-09-06 LAB — HEPATITIS PANEL, ACUTE
Hep A IgM: NEGATIVE
Hep B C IgM: NEGATIVE
Hep C Virus Ab: 0.1 s/co ratio (ref 0.0–0.9)
Hepatitis B Surface Ag: NEGATIVE

## 2018-09-06 LAB — VITAMIN D 25 HYDROXY (VIT D DEFICIENCY, FRACTURES): Vit D, 25-Hydroxy: 47.4 ng/mL (ref 30.0–100.0)

## 2018-09-06 LAB — TSH: TSH: 1.36 u[IU]/mL (ref 0.450–4.500)

## 2018-09-06 LAB — B12 AND FOLATE PANEL
Folate: 8.7 ng/mL (ref >3.0)
Vitamin B-12: 354 pg/mL (ref 232–1245)

## 2018-09-07 ENCOUNTER — Other Ambulatory Visit: Payer: Self-pay

## 2018-09-07 ENCOUNTER — Ambulatory Visit (INDEPENDENT_AMBULATORY_CARE_PROVIDER_SITE_OTHER): Payer: Managed Care, Other (non HMO) | Admitting: Family Medicine

## 2018-09-07 ENCOUNTER — Encounter: Payer: Self-pay | Admitting: Family Medicine

## 2018-09-07 VITALS — BP 124/70 | HR 97 | Temp 97.1°F | Resp 16 | Ht 67.0 in | Wt 257.6 lb

## 2018-09-07 DIAGNOSIS — E1129 Type 2 diabetes mellitus with other diabetic kidney complication: Secondary | ICD-10-CM

## 2018-09-07 DIAGNOSIS — Z113 Encounter for screening for infections with a predominantly sexual mode of transmission: Secondary | ICD-10-CM

## 2018-09-07 DIAGNOSIS — Z9009 Acquired absence of other part of head and neck: Secondary | ICD-10-CM

## 2018-09-07 DIAGNOSIS — E892 Postprocedural hypoparathyroidism: Secondary | ICD-10-CM

## 2018-09-07 DIAGNOSIS — R809 Proteinuria, unspecified: Secondary | ICD-10-CM

## 2018-09-07 DIAGNOSIS — Z1211 Encounter for screening for malignant neoplasm of colon: Secondary | ICD-10-CM | POA: Diagnosis not present

## 2018-09-07 DIAGNOSIS — Z124 Encounter for screening for malignant neoplasm of cervix: Secondary | ICD-10-CM

## 2018-09-07 DIAGNOSIS — Z01419 Encounter for gynecological examination (general) (routine) without abnormal findings: Secondary | ICD-10-CM | POA: Diagnosis not present

## 2018-09-07 DIAGNOSIS — Z794 Long term (current) use of insulin: Secondary | ICD-10-CM | POA: Diagnosis not present

## 2018-09-07 DIAGNOSIS — E039 Hypothyroidism, unspecified: Secondary | ICD-10-CM

## 2018-09-07 DIAGNOSIS — E785 Hyperlipidemia, unspecified: Secondary | ICD-10-CM

## 2018-09-07 DIAGNOSIS — Z9089 Acquired absence of other organs: Secondary | ICD-10-CM

## 2018-09-07 DIAGNOSIS — E559 Vitamin D deficiency, unspecified: Secondary | ICD-10-CM

## 2018-09-07 DIAGNOSIS — I10 Essential (primary) hypertension: Secondary | ICD-10-CM

## 2018-09-07 MED ORDER — OZEMPIC (1 MG/DOSE) 2 MG/1.5ML ~~LOC~~ SOPN: 1.0000 mg | PEN_INJECTOR | SUBCUTANEOUS | 1 refills | Status: DC

## 2018-09-07 MED ORDER — LEVOTHYROXINE SODIUM 75 MCG PO TABS: 75.0000 ug | ORAL_TABLET | Freq: Every day | ORAL | 1 refills | Status: DC

## 2018-09-07 MED ORDER — VITAMIN D (ERGOCALCIFEROL) 1.25 MG (50000 UNIT) PO CAPS: 50000.0000 [IU] | ORAL_CAPSULE | ORAL | 1 refills | Status: DC

## 2018-09-07 MED ORDER — ROSUVASTATIN CALCIUM 40 MG PO TABS: 40.0000 mg | ORAL_TABLET | Freq: Every day | ORAL | 1 refills | Status: DC

## 2018-09-07 NOTE — Progress Notes (Signed)
Name: Tabitha Shaw   MRN: [id number]    DOB: January 09, 1961   Date:09/07/2018       Progress Note  Subjective  Chief Complaint  Chief Complaint  Patient presents with  . Well Woman Exam    HPI   Patient presents for annual CPE and follow up  DMII :hgbA1C spiked since last visit up to 8.1% She states glucose at home has been in the 120's-140's but spiking to 180's  Shehas not been compliant with her diet lately, stress eating but is willing to do better, also not taking metformin but will start now.Denies polyphagia, polydipsia or polyuria, but she has nocturia once per night. Taking benazepril for microalbuminuria. Last LDL also went up to 162, and she has been compliant with statin therapy   HTN: taking medication daily and denies side effects, bp is at goal today, no chest pain or palpitation or dizziness.Continue Lotrel 5/20. Continue medications  Hyperlipidemia: taking Atorvastatin, denies myalgia, she has been compliant since she started taking medication every morning,last LDL is still above goal at 162  we will to rosuvastatin since not at goal   Hypothyroidism: she is taking Synthroid60mgdaily , denies constipation now just had to take stool softener after the surgery,  she has dry skin. TSH at goal , continue medications. Reviewed last level  Morbid obesity::her weight has been stable since last year. Still on GLP-1 agonist, she needs to resume physical activity and diet   Primary  Hyperparathyroidism: seen by Dr. SGabriel Carinaand referred to Dr. KMaudie Mercury found to have a right lower parathyroid enlargement and had surgery on 11/26/2017, and has been released since   Hemochromatosis : sees Dr FGrayland Ormondand has phlebotomy . Stable   Diet: not currently following a diabetic diet  Exercise: discussed importance of physical activity   USPSTF grade A and B recommendations    [facility]  0      Hypertension: BP Readings from Last 3 Encounters:  09/07/18 124/70  06/28/18 96/66  06/28/18 121/82   Obesity: Wt Readings from Last 3 Encounters:  09/07/18 257 lb 9.6 oz (116.8 kg)  06/28/18 258 lb 11.2 oz (117.3 kg)  03/21/18 261 lb (118.4 kg)   BMI Readings from Last 3 Encounters:  09/07/18 40.35 kg/m  06/28/18 40.52 kg/m  03/21/18 40.88 kg/m    Hep C Screening: 08/2018 STD testing and prevention (HIV/chl/gon/syphilis): she would like to be checked because of history of of positive HPV  Intimate partner violence: negative  Sexual History/Pain during Intercourse: not sexually active since she was told she had HPV , he works 3rd shift and worries about it Menstrual History/LMP/Abnormal Bleeding: s/p hysterectomy  Incontinence Symptoms: no problems   Advanced Care Planning: A voluntary discussion about advance care planning including the explanation and discussion of advance directives.  Discussed health care proxy and Living will, and the patient was able to identify a health care proxy as husband   Patient does not have a living will at present time.  Breast cancer: mammogram 08/23/2018 BRCA gene screening: N/A Cervical cancer screening: today, history of positive HPV   Osteoporosis Screening: osteopenia, she is taking vitamin D, discussed calcium diet   Lipids:  Lab Results  Component Value Date   CHOL 253 (H) 09/05/2018   CHOL 174 08/26/2017   CHOL 182 10/12/2016   Lab Results  Component Value Date   HDL 42 09/05/2018   HDL 38 (L) 08/26/2017  HDL 44 10/12/2016   Lab Results  Component Value Date   LDLCALC 162 (H) 09/05/2018   LDLCALC 104 (H) 08/26/2017   LDLCALC 108 (H) 10/12/2016   Lab Results  Component Value Date   TRIG 246 (H) 09/05/2018   TRIG 160 (H) 08/26/2017   TRIG 149 10/12/2016   Lab Results  Component Value Date   CHOLHDL 6.0 (H) 09/05/2018   CHOLHDL 4.6 (H) 08/26/2017   CHOLHDL 4.1 10/12/2016   No results found for:  LDLDIRECT  Glucose:  Glucose  Date Value Ref Range Status  09/05/2018 174 (H) 65 - 99 mg/dL Final  08/26/2017 116 (H) 65 - 99 mg/dL Final  05/13/2017 156 (H) 65 - 99 mg/dL Final    Skin cancer: discussed atypical lesions  Colorectal cancer: referral placed today  Lung cancer:   Low Dose CT Chest recommended if Age 49-80 years, 30 pack-year currently smoking OR have quit w/in 15years. Patient does not qualify.   ECG: 2011   Patient Active Problem List   Diagnosis Date Noted  . Cervical high risk HPV (human papillomavirus) test positive 09/03/2016  . B12 deficiency 09/04/2015  . Elevated PTHrP level 09/04/2015  . Benign hypertension 09/12/2014  . Acanthosis nigricans 09/09/2014  . Diabetes mellitus with renal manifestation ([place]) 09/09/2014  . Dyslipidemia 09/09/2014  . Abnormal electrocardiogram 09/09/2014  . Enlarged kidney 09/09/2014  . 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 Enlarged liver 09/09/2014  . Calcium blood increased 09/09/2014  . Adult hypothyroidism 09/09/2014  . Microalbuminuria 09/09/2014  . Morbid obesity due to excess calories ([place]) 09/09/2014  . Spinal stenosis of lumbar region with neurogenic claudication 09/09/2014  . Osteopenia 09/09/2014  . Vitamin D deficiency 09/09/2014    Past Surgical History:  Procedure Laterality Date  . ABDOMINAL HYSTERECTOMY  2005  . blood transfusion yearly    . BREAST REDUCTION SURGERY    . CESAREAN SECTION    . CESAREAN SECTION  1992  . CHOLECYSTECTOMY  2000  . lapcholecystectomy    . PARATHYROIDECTOMY Right 11/26/2017   right lower , Dr[facility]audie [facility]  . REDUCTION MAMMAPLASTY Bilateral 1985  . VESICOVAGINAL FISTULA CLOSURE W/ TAH      Family History  Problem Relation Age of Onset  . Colon polyps Mother   . Hemachromatosis Mother   . Hypertension Mother   . Depression Sister   . Hypertension Brother   . Colon polyps Brother   . Cancer Father   . Breast cancer Cousin   . Breast cancer Cousin     Social  History   Socioeconomic History  . Marital status: Married    Spouse name: [name]   . Number of children: 1  . Years of education: Not on file  . Highest education level: Some college, no degree  Occupational History  . Occupation: customer services     Comment: [employer]  . Financial resource strain: Not hard at all  . Food insecurity    Worry: Never true    Inability: Never true  . Transportation needs    Medical: No    Non-medical: No  Tobacco Use  . Smoking status: Former Smoker    Packs/day: 0.25    Years: 8.00    Pack years: 2.00    Types: Cigarettes    Start date: 01/19/1982    Quit date: 01/19/1990    Years since quitting: 28.6  . Smokeless tobacco: Never Used  Substance and Sexual Activity  . Alcohol use: Yes  Alcohol/week: 0.0 standard drinks    Comment: occasionally  . Drug use: No  . Sexual activity: Yes    Partners: Male    Birth control/protection: Surgical  Lifestyle  . Physical activity    Days per week: 0 days    Minutes per session: 0 min  . Stress: Not at all  Relationships  . Social connections    Talks on phone: More than three times a week    Gets together: More than three times a week    Attends religious service: More than 4 times per year    Active member of club or organization: Yes    Attends meetings of clubs or organizations: More than 4 times per year    Relationship status: Married  . Intimate partner violence    Fear of current or ex partner: No    Emotionally abused: No    Physically abused: No    Forced sexual activity: No  Other Topics Concern  . Not on file  Social History Narrative   Married; works  full time   Her daughter, [name] is a travel Marine scientist      Current Outpatient Medications:  .  amLODipine-benazepril (LOTREL) 5-20 MG capsule, TAKE 1 CAPSULE BY MOUTH  DAILY, Disp: 90 capsule, Rfl: 1 .  aspirin 81 MG EC tablet, Take 81 mg by mouth daily.  , Disp: , Rfl:  .  ONE TOUCH ULTRA TEST test strip, CHECK  FASTING BLOOD SUGAR  TWICE WEEKLY, Disp: 100 each, Rfl: 1 .  fluticasone (FLONASE) 50 MCG/ACT nasal spray, Place 2 sprays into both nostrils daily. (Patient not taking: Reported on 09/07/2018), Disp: 48 g, Rfl: 0 .  levothyroxine (SYNTHROID) 75 MCG tablet, Take 1 tablet (75 mcg total) by mouth daily., Disp: 90 tablet, Rfl: 1 .  loratadine (CLARITIN) 10 MG tablet, Take 1 tablet (10 mg total) by mouth daily. (Patient not taking: Reported on 09/07/2018), Disp: 30 tablet, Rfl: 0 .  metFORMIN (GLUCOPHAGE-XR) 750 MG 24 hr tablet, TAKE 1 TABLET BY MOUTH  DAILY WITH BREAKFAST (Patient not taking: Reported on 09/07/2018), Disp: 90 tablet, Rfl: 0 .  rosuvastatin (CRESTOR) 40 MG tablet, Take 1 tablet (40 mg total) by mouth daily., Disp: 90 tablet, Rfl: 1 .  Semaglutide, 1 MG/DOSE, (OZEMPIC, 1 MG/DOSE,) 2 MG/1.5ML SOPN, Inject 1 mg into the skin once a week., Disp: 6 pen, Rfl: 1 .  Vitamin D, Ergocalciferol, (DRISDOL) 1.25 MG (50000 UT) CAPS capsule, Take 1 capsule (50,000 Units total) by mouth every 7 (seven) days. Take 2 every week, Disp: 24 capsule, Rfl: 1  No Known Allergies   ROS  Constitutional: Negative for fever or weight change.  Respiratory: Negative for cough and shortness of breath.   Cardiovascular: Negative for chest pain or palpitations.  Gastrointestinal: Negative for abdominal pain, no bowel changes.  Musculoskeletal: Negative for gait problem or joint swelling.  Skin: Negative for rash.  Neurological: Negative for dizziness or headache.  No other specific complaints in a complete review of systems (except as listed in HPI above).  Objective  Vitals:   09/07/18 0855  BP: 124/70  Pulse: 97  Resp: 16  Temp: (!) 97.1 F (36.2 C)  TempSrc: Temporal  SpO2: 98%  Weight: 257 lb 9.6 oz (116.8 kg)  Height: _0  (1.702 m)    Body mass index is 40.35 kg/m.  Physical Exam  Constitutional: Patient appears well-developed and well-nourished. Obese No distress.  HENT: Head:  Normocephalic and atraumatic. Ears: B TMs ok,  no erythema or effusion; Nose: Nose normal. Mouth/Throat: Oropharynx is clear and moist. No oropharyngeal exudate.  Eyes: Conjunctivae and EOM are normal. Pupils are equal, round, and reactive to light. No scleral icterus.  Neck: Normal range of motion. Neck supple. No JVD present. No thyromegaly present.  Cardiovascular: Normal rate, regular rhythm and normal heart sounds.  No murmur heard. No BLE edema. Pulmonary/Chest: Effort normal and breath sounds normal. No respiratory distress. Abdominal: Soft. Bowel sounds are normal, no distension. There is no tenderness. no masses Breast: no lumps or masses, no nipple discharge or rashes FEMALE GENITALIA:  External genitalia normal External urethra normal Vaginal vault normal without discharge or lesions Cervix normal without discharge or lesions Bimanual exam normal without masses RECTAL: not done  Musculoskeletal: Normal range of motion, no joint effusions. No gross deformities Neurological: he is alert and oriented to person, place, and time. No cranial nerve deficit. Coordination, balance, strength, speech and gait are normal.  Skin: Skin is warm and dry. No rash noted. No erythema.  Psychiatric: Patient has a normal mood and affect. behavior is normal. Judgment and thought content normal.   Recent Results (from the past 2160 hour(s))  B12 and Folate Panel     Status: None   Collection Time: 09/05/18 11:25 AM  Result Value Ref Range   Vitamin B-12 354 232 - 1,245 pg/mL   Folate 8.7 >3.0 ng/mL    Comment: A serum folate concentration of less than 3.1 ng/mL is considered to represent clinical deficiency.   VITAMIN D 25 Hydroxy (Vit-D Deficiency, Fractures)     Status: None   Collection Time: 09/05/18 11:25 AM  Result Value Ref Range   Vit D, 25-Hydroxy 47.4 30.0 - 100.0 ng/mL    Comment: Vitamin D deficiency has been defined by the [place] practice  guideline as a level of serum 25-OH vitamin D less than 20 ng/mL (1,2). The Endocrine Society went on to further define vitamin D insufficiency as a level between 21 and 29 ng/mL (2). 1. IOM (Institute of Medicine). 2010. Dietary reference    intakes for calcium and D. [place]: The    [employer]. 2. Holick MF, Binkley [place], Bischoff-Ferrari HA, et al.    Evaluation, treatment, and prevention of vitamin D    deficiency: an Endocrine Society clinical practice    guideline. JCEM. 2011 Jul; 96(7):1911-30.   Lipid panel     Status: Abnormal   Collection Time: 09/05/18 11:25 AM  Result Value Ref Range   Cholesterol, Total 253 (H) 100 - 199 mg/dL   Triglycerides 246 (H) 0 - 149 mg/dL   HDL 42 >39 mg/dL   VLDL Cholesterol Cal 49 (H) 5 - 40 mg/dL   LDL Calculated 162 (H) 0 - 99 mg/dL   Chol/HDL Ratio 6.0 (H) 0.0 - 4.4 ratio    Comment:                                   T. Chol/HDL Ratio                                             Men  Women  1/2 Avg.Risk  3.4    3.3                                   Avg.Risk  5.0    4.4                                2X Avg.Risk  9.6    7.1                                3X Avg.Risk 23.4   11.0   Hemoglobin A1c     Status: Abnormal   Collection Time: 09/05/18 11:25 AM  Result Value Ref Range   Hgb A1c MFr Bld 8.1 (H) 4.8 - 5.6 %    Comment:          Prediabetes: 5.7 - 6.4          Diabetes: >6.4          Glycemic control for adults with diabetes: <7.0    Est. average glucose Bld gHb Est-mCnc 186 mg/dL  Hepatitis panel, acute     Status: None   Collection Time: 09/05/18 11:25 AM  Result Value Ref Range   Hep A IgM Negative Negative   Hepatitis B Surface Ag Negative Negative   Hep B C IgM Negative Negative   Hep C Virus Ab 0.1 0.0 - 0.9 s/co ratio    Comment:                                   Negative:     < 0.8                              Indeterminate: 0.8 - 0.9                                    Positive:     > 0.9  The CDC recommends that a positive HCV antibody result  be followed up with a HCV Nucleic Acid Amplification  test ([id number]).   Comprehensive metabolic panel     Status: Abnormal   Collection Time: 09/05/18 11:25 AM  Result Value Ref Range   Glucose 174 (H) 65 - 99 mg/dL   BUN 9 6 - 24 mg/dL   Creatinine, Ser 0.69 0.57 - 1.00 mg/dL   GFR calc non Af Amer 98 >59 mL/min/1.73   GFR calc Af Amer 113 >59 mL/min/1.73   BUN/Creatinine Ratio 13 9 - 23   Sodium 138 134 - 144 mmol/L   Potassium 4.3 3.5 - 5.2 mmol/L   Chloride 98 96 - 106 mmol/L   CO2 23 20 - 29 mmol/L   Calcium 9.7 8.7 - 10.2 mg/dL   Total Protein 7.4 6.0 - 8.5 g/dL   Albumin 4.7 3.8 - 4.9 g/dL   Globulin, Total 2.7 1.5 - 4.5 g/dL   Albumin/Globulin Ratio 1.7 1.2 - 2.2   Bilirubin Total 0.8 0.0 - 1.2 mg/dL   Alkaline Phosphatase 83 39 - 117 IU/L   AST 68 (H) 0 - 40 IU/L   ALT 73 (H) 0 - 32 IU/L  TSH     Status: None   Collection Time: 09/05/18 11:25 AM  Result Value Ref Range   TSH 1.360 0.450 - 4.500 uIU/mL  SAR CoV2 Serology (COVID 19)AB(IGG)IA     Status: None   Collection Time: 09/05/18 11:25 AM  Result Value Ref Range   Abbott SARS-CoV-2 Ab, IgG Negative Negative    Comment: This sample does not contain detectable SARS-CoV-2 IgG antibodies. This negative result does not rule out SARS-CoV-2 infection. Correlation with epidemiologic risk factors and other clinical and laboratory findings is recommended. Serologic results should not be used as the sole basis to diagnose or exclude recent SARS-CoV-2 infection. This assay was performed using the Abbott SARS-CoV-2 IgG assay.      PHQ2/9: Depression screen [facility] 2/9 09/07/2018 03/21/2018 12/03/2017 08/31/2017 02/02/2017  Decreased Interest 0 0 0 0 0  Down, Depressed, Hopeless 0 0 0 0 0  PHQ - 2 Score 0 0 0 0 0  Altered sleeping 0 0 0 - -  Tired, decreased energy 1 0 0 - -  Change in appetite 1 0 0 - -  Feeling bad or failure about yourself  0 0  0 - -  Trouble concentrating 0 0 0 - -  Moving slowly or fidgety/restless 0 0 0 - -  Suicidal thoughts 0 0 - - -  PHQ-9 Score 2 0 0 - -  Difficult doing work/chores Not difficult at all Not difficult at all Not difficult at all - -     Fall Risk: Fall Risk  09/07/2018 03/21/2018 12/03/2017 05/17/2017 02/02/2017  Falls in the past year? 0 0 0 No No  Number falls in past yr: 0 0 - - -  Injury with Fall? 0 0 - - -     Functional Status Survey: Is the patient deaf or have difficulty hearing?: No Does the patient have difficulty seeing, even when wearing glasses/contacts?: Yes Does the patient have difficulty concentrating, remembering, or making decisions?: No Does the patient have difficulty walking or climbing stairs?: No Does the patient have difficulty dressing or bathing?: No Does the patient have difficulty doing errands alone such as visiting a doctor's office or shopping?: No   Assessment & Plan  1. Type 2 diabetes mellitus with microalbuminuria, with long-term current use of insulin (HCC)   2. Cervical cancer screening  - Pap IG, CT/NG NAA, and HPV (high risk)  3. Colon cancer screening  - Ambulatory referral to Gastroenterology  4. Well woman exam   5. Adult hypothyroidism  - levothyroxine (SYNTHROID) 75 MCG tablet; Take 1 tablet (75 mcg total) by mouth daily.  Dispense: 90 tablet; Refill: 1  6. Benign hypertension  At goal  7. Morbid obesity due to excess calories ([place])  Needs to resume diet and exercise   8. History of parathyroidectomy   9. 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[place])  Under the care of Dr. Grayland Ormond   10. Dyslipidemia   11. Routine screening for STI (sexually transmitted infection)  - HIV Antibody (routine testing w rflx) - RPR - Hepatitis panel, acute  12. Vitamin D deficiency  - Vitamin D, Ergocalciferol, (DRISDOL) 1.25 MG (50000 UT) CAPS capsule; Take 1 capsule (50,000 Units total) by mouth every 7 (seven) days. Take 2 every week   Dispense: 24 capsule; Refill: 1   -USPSTF grade A and B recommendations reviewed with patient; age-appropriate recommendations, preventive care, screening tests, etc discussed and encouraged; healthy living encouraged; see AVS for patient education given to patient -Discussed importance of 150 minutes  of physical activity weekly, eat two servings of fish weekly, eat one serving of tree nuts ( cashews, pistachios, pecans, almonds.Marland Kitchen) every other day, eat 6 servings of fruit/vegetables daily and drink plenty of water and avoid sweet beverages.

## 2018-09-07 NOTE — Patient Instructions (Signed)

## 2018-09-12 LAB — SPECIMEN STATUS REPORT

## 2018-09-12 LAB — PAP IG, CT-NG NAA, HPV HIGH-RISK
Chlamydia, Nuc. Acid Amp: NEGATIVE
Gonococcus by Nucleic Acid Amp: NEGATIVE
HPV, high-risk: POSITIVE — AB

## 2018-09-15 ENCOUNTER — Encounter: Payer: Self-pay | Admitting: *Deleted

## 2018-09-15 ENCOUNTER — Other Ambulatory Visit: Payer: Self-pay

## 2018-09-15 ENCOUNTER — Telehealth: Payer: Self-pay | Admitting: Gastroenterology

## 2018-09-15 DIAGNOSIS — Z86010 Personal history of colonic polyps: Secondary | ICD-10-CM

## 2018-09-15 DIAGNOSIS — Z1211 Encounter for screening for malignant neoplasm of colon: Secondary | ICD-10-CM

## 2018-09-15 NOTE — Telephone Encounter (Signed)
Gastroenterology Pre-Procedure Review  Request Date: Friday 10/07/2018 [facility] Requesting Physician: Dr. Bonna Gains    PATIENT REVIEW QUESTIONS: The patient responded to the following health history questions as indicated:    1. Are you having any GI issues? no 2. Do you have a personal history of Polyps? yes (5 yrs ago) 3. Do you have a family history of Colon Cancer or Polyps? yes (Mother polyps and Brother polyps) 4. Diabetes Mellitus? yes (Type II) 5. Joint replacements in the past 12 months?no 6. Major health problems in the past 3 months?no 7. Any artificial heart valves, MVP, or defibrillator?no    MEDICATIONS & ALLERGIES:    Patient reports the following regarding taking any anticoagulation/antiplatelet therapy:   Plavix, Coumadin, Eliquis, Xarelto, Lovenox, Pradaxa, Brilinta, or Effient? no Aspirin? yes (81 mg)  Patient confirms/reports the following medications:  Current Outpatient Medications  Medication Sig Dispense Refill  . amLODipine-benazepril (LOTREL) 5-20 MG capsule TAKE 1 CAPSULE BY MOUTH  DAILY 90 capsule 1  . aspirin 81 MG EC tablet Take 81 mg by mouth daily.      . fluticasone (FLONASE) 50 MCG/ACT nasal spray Place 2 sprays into both nostrils daily. (Patient not taking: Reported on 09/07/2018) 48 g 0  . levothyroxine (SYNTHROID) 75 MCG tablet Take 1 tablet (75 mcg total) by mouth daily. 90 tablet 1  . loratadine (CLARITIN) 10 MG tablet Take 1 tablet (10 mg total) by mouth daily. (Patient not taking: Reported on 09/07/2018) 30 tablet 0  . metFORMIN (GLUCOPHAGE-XR) 750 MG 24 hr tablet TAKE 1 TABLET BY MOUTH  DAILY WITH BREAKFAST (Patient not taking: Reported on 09/07/2018) 90 tablet 0  . ONE TOUCH ULTRA TEST test strip CHECK FASTING BLOOD SUGAR  TWICE WEEKLY 100 each 1  . rosuvastatin (CRESTOR) 40 MG tablet Take 1 tablet (40 mg total) by mouth daily. 90 tablet 1  . Semaglutide, 1 MG/DOSE, (OZEMPIC, 1 MG/DOSE,) 2 MG/1.5ML SOPN Inject 1 mg into the skin once a week. 6 pen 1   . Vitamin D, Ergocalciferol, (DRISDOL) 1.25 MG (50000 UT) CAPS capsule Take 1 capsule (50,000 Units total) by mouth every 7 (seven) days. Take 2 every week 24 capsule 1   No current facility-administered medications for this visit.     Patient confirms/reports the following allergies:  No Known Allergies  No orders of the defined types were placed in this encounter.   AUTHORIZATION INFORMATION Primary Insurance: 1D#: Group #:  Secondary Insurance: 1D#: Group #:  SCHEDULE INFORMATION: Date:  Time: Location:

## 2018-09-23 ENCOUNTER — Other Ambulatory Visit: Payer: Self-pay | Admitting: Family Medicine

## 2018-09-23 ENCOUNTER — Other Ambulatory Visit: Payer: Self-pay

## 2018-09-24 LAB — RPR: RPR Ser Ql: NONREACTIVE

## 2018-09-24 LAB — HIV ANTIBODY (ROUTINE TESTING W REFLEX): HIV Screen 4th Generation wRfx: NONREACTIVE

## 2018-09-27 ENCOUNTER — Encounter: Payer: Self-pay | Admitting: Oncology

## 2018-09-27 ENCOUNTER — Inpatient Hospital Stay: Payer: Managed Care, Other (non HMO) | Attending: Oncology

## 2018-09-27 ENCOUNTER — Other Ambulatory Visit: Payer: Self-pay

## 2018-09-29 ENCOUNTER — Other Ambulatory Visit: Payer: Self-pay

## 2018-09-29 ENCOUNTER — Encounter: Payer: Self-pay | Admitting: *Deleted

## 2018-09-29 DIAGNOSIS — Z1211 Encounter for screening for malignant neoplasm of colon: Secondary | ICD-10-CM

## 2018-10-07 ENCOUNTER — Ambulatory Visit: Admit: 2018-10-07 | Payer: Managed Care, Other (non HMO) | Admitting: Gastroenterology

## 2018-10-07 SURGERY — COLONOSCOPY WITH PROPOFOL
Anesthesia: General

## 2018-10-17 ENCOUNTER — Other Ambulatory Visit: Payer: Self-pay | Admitting: Family Medicine

## 2018-10-17 DIAGNOSIS — E785 Hyperlipidemia, unspecified: Secondary | ICD-10-CM

## 2018-10-17 NOTE — Telephone Encounter (Signed)
Requested medication (s) are due for refill today: yes  Requested medication (s) are on the active medication list: yes  Last refill:  08/29/2018  Future visit scheduled: yes  Notes to clinic: review for refill Medication was discontinued   Requested Prescriptions  Pending Prescriptions Disp Refills   atorvastatin (LIPITOR) 40 MG tablet [Pharmacy Med Name: ATORVASTATIN  40MG   TAB] 90 tablet 3    Sig: TAKE 1 TABLET BY MOUTH  DAILY     Cardiovascular:  Antilipid - Statins Failed - 10/17/2018  5:00 AM      Failed - Total Cholesterol in normal range and within 360 days    Cholesterol, Total  Date Value Ref Range Status  09/05/2018 253 (H) 100 - 199 mg/dL Final         Failed - LDL in normal range and within 360 days    LDL Calculated  Date Value Ref Range Status  09/05/2018 162 (H) 0 - 99 mg/dL Final         Failed - Triglycerides in normal range and within 360 days    Triglycerides  Date Value Ref Range Status  09/05/2018 246 (H) 0 - 149 mg/dL Final   Triglyceride fasting, serum  Date Value Ref Range Status  12/27/2008 202 mg/dL          Passed - HDL in normal range and within 360 days    HDL  Date Value Ref Range Status  09/05/2018 42 >39 mg/dL Final         Passed - Patient is not pregnant      Passed - Valid encounter within last 12 months    Recent Outpatient Visits          1 month ago Type 2 diabetes mellitus with microalbuminuria, with long-term current use of insulin [facility])   [facility] Steele Sizer, MD   7 months ago Viral upper respiratory tract infection   [place] [place], NP   10 months ago Adult hypothyroidism   [facility] Steele Sizer, MD   1 year ago Well woman exam   [facility] Steele Sizer, MD   1 year ago Type 2 diabetes mellitus with microalbuminuria, with long-term current use of insulin [facility])   [facility] Steele Sizer, MD      Future Appointments            In 1 month Ancil Boozer, Drue Stager, MD [facility]

## 2018-10-26 ENCOUNTER — Other Ambulatory Visit: Payer: Self-pay | Admitting: Family Medicine

## 2018-10-26 DIAGNOSIS — E1129 Type 2 diabetes mellitus with other diabetic kidney complication: Secondary | ICD-10-CM

## 2018-10-26 DIAGNOSIS — Z794 Long term (current) use of insulin: Secondary | ICD-10-CM

## 2018-11-08 ENCOUNTER — Other Ambulatory Visit: Payer: Self-pay

## 2018-11-08 ENCOUNTER — Other Ambulatory Visit
Admission: RE | Admit: 2018-11-08 | Discharge: 2018-11-08 | Disposition: A | Discharge status: Home / Self Care | Payer: Managed Care, Other (non HMO) | Source: Ambulatory Visit | Attending: Gastroenterology | Admitting: Gastroenterology

## 2018-11-08 DIAGNOSIS — Z01812 Encounter for preprocedural laboratory examination: Secondary | ICD-10-CM | POA: Insufficient documentation

## 2018-11-08 DIAGNOSIS — Z20828 Contact with and (suspected) exposure to other viral communicable diseases: Secondary | ICD-10-CM | POA: Insufficient documentation

## 2018-11-08 LAB — SARS CORONAVIRUS 2 (TAT 6-24 HRS): SARS Coronavirus 2: NEGATIVE

## 2018-11-11 ENCOUNTER — Ambulatory Visit: Payer: Managed Care, Other (non HMO) | Admitting: Anesthesiology

## 2018-11-11 ENCOUNTER — Encounter
Admission: RE | Disposition: A | Discharge status: Home / Self Care | Payer: Self-pay | Source: Ambulatory Visit | Attending: Gastroenterology

## 2018-11-11 ENCOUNTER — Ambulatory Visit
Admission: RE | Admit: 2018-11-11 | Discharge: 2018-11-11 | Disposition: A | Discharge status: Home / Self Care | Payer: Managed Care, Other (non HMO) | Source: Ambulatory Visit | Attending: Gastroenterology | Admitting: Gastroenterology

## 2018-11-11 DIAGNOSIS — I10 Essential (primary) hypertension: Secondary | ICD-10-CM | POA: Diagnosis not present

## 2018-11-11 DIAGNOSIS — K6289 Other specified diseases of anus and rectum: Secondary | ICD-10-CM | POA: Insufficient documentation

## 2018-11-11 DIAGNOSIS — Z79899 Other long term (current) drug therapy: Secondary | ICD-10-CM | POA: Insufficient documentation

## 2018-11-11 DIAGNOSIS — E119 Type 2 diabetes mellitus without complications: Secondary | ICD-10-CM | POA: Insufficient documentation

## 2018-11-11 DIAGNOSIS — E559 Vitamin D deficiency, unspecified: Secondary | ICD-10-CM | POA: Diagnosis not present

## 2018-11-11 DIAGNOSIS — D125 Benign neoplasm of sigmoid colon: Secondary | ICD-10-CM | POA: Insufficient documentation

## 2018-11-11 DIAGNOSIS — Z860100 Personal history of colon polyps, unspecified: Secondary | ICD-10-CM

## 2018-11-11 DIAGNOSIS — Z87891 Personal history of nicotine dependence: Secondary | ICD-10-CM | POA: Diagnosis not present

## 2018-11-11 DIAGNOSIS — Z7984 Long term (current) use of oral hypoglycemic drugs: Secondary | ICD-10-CM | POA: Diagnosis not present

## 2018-11-11 DIAGNOSIS — K635 Polyp of colon: Secondary | ICD-10-CM

## 2018-11-11 DIAGNOSIS — Z1211 Encounter for screening for malignant neoplasm of colon: Secondary | ICD-10-CM | POA: Diagnosis not present

## 2018-11-11 DIAGNOSIS — E785 Hyperlipidemia, unspecified: Secondary | ICD-10-CM | POA: Insufficient documentation

## 2018-11-11 DIAGNOSIS — Z6839 Body mass index (BMI) 39.0-39.9, adult: Secondary | ICD-10-CM | POA: Insufficient documentation

## 2018-11-11 DIAGNOSIS — E039 Hypothyroidism, unspecified: Secondary | ICD-10-CM | POA: Diagnosis not present

## 2018-11-11 DIAGNOSIS — Z79890 Hormone replacement therapy (postmenopausal): Secondary | ICD-10-CM | POA: Diagnosis not present

## 2018-11-11 DIAGNOSIS — K5730 Diverticulosis of large intestine without perforation or abscess without bleeding: Secondary | ICD-10-CM | POA: Diagnosis not present

## 2018-11-11 DIAGNOSIS — Z7982 Long term (current) use of aspirin: Secondary | ICD-10-CM | POA: Insufficient documentation

## 2018-11-11 DIAGNOSIS — D120 Benign neoplasm of cecum: Secondary | ICD-10-CM | POA: Diagnosis not present

## 2018-11-11 HISTORY — PX: COLONOSCOPY WITH PROPOFOL: SHX5780

## 2018-11-11 LAB — GLUCOSE, CAPILLARY: Glucose-Capillary: 122 mg/dL — ABNORMAL HIGH (ref 70–99)

## 2018-11-11 SURGERY — COLONOSCOPY WITH PROPOFOL
Anesthesia: General

## 2018-11-11 MED ORDER — PROPOFOL 500 MG/50ML IV EMUL
INTRAVENOUS | Status: DC | PRN
  Administered 2018-11-11: 130 ug/kg/min via INTRAVENOUS

## 2018-11-11 MED ORDER — PROPOFOL 10 MG/ML IV BOLUS
INTRAVENOUS | Status: DC | PRN
  Administered 2018-11-11: 40 mg via INTRAVENOUS
  Administered 2018-11-11: 80 mg via INTRAVENOUS
  Administered 2018-11-11: 30 mg via INTRAVENOUS

## 2018-11-11 MED ORDER — PROPOFOL 500 MG/50ML IV EMUL
INTRAVENOUS | Status: AC
  Filled 2018-11-11: qty 50

## 2018-11-11 MED ORDER — PROPOFOL 10 MG/ML IV BOLUS
INTRAVENOUS | Status: AC
  Filled 2018-11-11: qty 20

## 2018-11-11 MED ORDER — SODIUM CHLORIDE 0.9 % IV SOLN
INTRAVENOUS | Status: DC
  Administered 2018-11-11: 1000 mL via INTRAVENOUS

## 2018-11-11 NOTE — Anesthesia Preprocedure Evaluation (Addendum)
Anesthesia Evaluation  Patient identified by MRN, date of birth, ID band Patient awake    Reviewed: Allergy & Precautions, H&P , NPO status , Patient's Chart, lab work & pertinent test results  Airway Mallampati: III  TM Distance: >3 FB     Dental  (+) Teeth Intact   Pulmonary neg pulmonary ROS, neg COPD, former smoker,           Cardiovascular hypertension, (-) angina(-) Past MI      Neuro/Psych negative neurological ROS  negative psych ROS   GI/Hepatic negative GI ROS, Neg liver ROS,   Endo/Other  diabetesHypothyroidism Morbid obesity  Renal/GU Renal disease (hypertrophy of kidney)  negative genitourinary   Musculoskeletal   Abdominal   Peds  Hematology negative hematology ROS (+)   Anesthesia Other Findings Past Medical History: No date: Acquired acanthosis nigricans No date: Diabetes type 2, controlled (HCC)     Comment:  if controlled was not specified No date: Hemochromatosis No date: Hepatomegaly No date: HLD (hyperlipidemia) No date: HTN (hypertension) No date: Hyperlipidemia No date: Hypertension No date: Hypertrophy of kidney No date: Hypothyroidism No date: Intertrigo No date: Obesity No date: Osteopenia No date: Postinflammatory hyperpigmentation No date: Snoring No date: Thyroid disease No date: Vitamin D deficiency  Past Surgical History: 2005: ABDOMINAL HYSTERECTOMY No date: blood transfusion yearly No date: BREAST REDUCTION SURGERY No date: CESAREAN SECTION 1992: CESAREAN SECTION 2000: CHOLECYSTECTOMY No date: lapcholecystectomy 11/26/2017: PARATHYROIDECTOMY; Right     Comment:  right lower , Dr. Maudie Mercury at [place]: [place]; Bilateral No date: VESICOVAGINAL FISTULA CLOSURE W/ TAH     Reproductive/Obstetrics negative OB ROS                           Anesthesia Physical Anesthesia Plan  ASA: III  Anesthesia Plan: General   Post-op Pain  Management:    Induction:   PONV Risk Score and Plan: Propofol infusion and TIVA  Airway Management Planned: Natural Airway and Nasal Cannula  Additional Equipment:   Intra-op Plan:   Post-operative Plan:   Informed Consent: I have reviewed the patients History and Physical, chart, labs and discussed the procedure including the risks, benefits and alternatives for the proposed anesthesia with the patient or authorized representative who has indicated his/her understanding and acceptance.     Dental Advisory Given  Plan Discussed with: Anesthesiologist, CRNA and Surgeon  Anesthesia Plan Comments:        Anesthesia Quick Evaluation

## 2018-11-11 NOTE — Anesthesia Post-op Follow-up Note (Signed)
Anesthesia QCDR form completed.        

## 2018-11-11 NOTE — Anesthesia Postprocedure Evaluation (Signed)
Anesthesia Post Note  Patient: Tabitha Shaw  Procedure(s) Performed: COLONOSCOPY WITH PROPOFOL (N/A )  Patient location during evaluation: PACU Anesthesia Type: General Level of consciousness: awake and alert Pain management: pain level controlled Vital Signs Assessment: post-procedure vital signs reviewed and stable Respiratory status: spontaneous breathing, nonlabored ventilation and respiratory function stable Cardiovascular status: blood pressure returned to baseline and stable Postop Assessment: no apparent nausea or vomiting Anesthetic complications: no     Last Vitals:  Vitals:   11/11/18 1032 11/11/18 1042  BP: 102/80 109/82  Pulse:    Resp:    Temp:    SpO2:      Last Pain:  Vitals:   11/11/18 0824  TempSrc: Tympanic  PainSc: 0-No pain                 Durenda Hurt

## 2018-11-11 NOTE — H&P (Signed)
Vonda Antigua, MD [address], [place], [place], [place], [address] [address], [place], [place], [place], [address] Phone: [phone]  Fax: [phone]  Primary Care Physician:  Steele Sizer, MD   Pre-Procedure History & Physical: HPI:  Tabitha Shaw is a 58 y.o. female is here for a colonoscopy.   Past Medical History:  Diagnosis Date  . Acquired acanthosis nigricans   . Diabetes type 2, controlled ([place])    if controlled was not specified  . Hemochromatosis   . Hepatomegaly   . HLD (hyperlipidemia)   . HTN (hypertension)   . Hyperlipidemia   . Hypertension   . Hypertrophy of kidney   . Hypothyroidism   . Intertrigo   . Obesity   . Osteopenia   . Postinflammatory hyperpigmentation   . Snoring   . Thyroid disease   . Vitamin D deficiency     Past Surgical History:  Procedure Laterality Date  . ABDOMINAL HYSTERECTOMY  2005  . blood transfusion yearly    . BREAST REDUCTION SURGERY    . CESAREAN SECTION    . CESAREAN SECTION  1992  . CHOLECYSTECTOMY  2000  . lapcholecystectomy    . PARATHYROIDECTOMY Right 11/26/2017   right lower , Dr[facility]aud[facility] at [facility]  . REDUCTION MAMMAPLASTY Bilateral 1985  . VESICOVAGINAL FISTULA CLOSURE W/ TAH      Prior to Admission medications   Medication Sig Start Date End Date Taking? Authorizing Provider  amLODipine-benazepril (LOTREL) 5-20 MG capsule TAKE 1 CAPSULE BY MOUTH  DAILY 07/07/18  Yes Sowles, Drue Stager, MD  aspirin 81 MG EC tablet Take 81 mg by mouth daily.     Yes [provider]  fluticasone (FLONASE) 50 MCG/ACT nasal spray Place 2 sprays into both nostrils daily. 07/13/16  Yes Sowles, Drue Stager, MD  levothyroxine (SYNTHROID) 75 MCG tablet Take 1 tablet (75 mcg total) by mouth daily. 09/07/18  Yes Sowles, Drue Stager, MD  loratadine (CLARITIN) 10 MG tablet Take 1 tablet (10 mg total) by mouth daily. 03/21/18  Yes Poulose, Bethel Born, NP  metFORMIN (GLUCOPHAGE-XR) 750 MG 24 hr tablet TAKE 1 TABLET BY MOUTH   DAILY WITH BREAKFAST 10/26/18  Yes Sowles, Drue Stager, MD  ONE TOUCH ULTRA TEST test strip CHECK FASTING BLOOD SUGAR  TWICE WEEKLY 12/25/17  Yes Sowles, Drue Stager, MD  rosuvastatin (CRESTOR) 40 MG tablet Take 1 tablet (40 mg total) by mouth daily. 09/07/18  Yes Sowles, Drue Stager, MD  Semaglutide, 1 MG/DOSE, (OZEMPIC, 1 MG/DOSE,) 2 MG/1.5ML SOPN Inject 1 mg into the skin once a week. 09/07/18  Yes Sowles, Drue Stager, MD  Vitamin D, Ergocalciferol, (DRISDOL) 1.25 MG (50000 UT) CAPS capsule Take 1 capsule (50,000 Units total) by mouth every 7 (seven) days. Take 2 every week 09/07/18  Yes Steele Sizer, MD    Allergies as of 09/29/2018  . (No Known Allergies)    Family History  Problem Relation Age of Onset  . Colon polyps Mother   . Hemachromatosis Mother   . Hypertension Mother   . Depression Sister   . Hypertension Brother   . Colon polyps Brother   . Cancer Father   . Breast cancer Cousin   . Breast cancer Cousin     Social History   Socioeconomic History  . Marital status: Married    Spouse name: [name]   . Number of children: 1  . Years of education: Not on file  . Highest education level: Some college, no degree  Occupational History  . Occupation: Designer, television/film set  Comment: [employer]   Social Needs  . Financial resource strain: Not hard at all  . Food insecurity    Worry: Never true    Inability: Never true  . Transportation needs    Medical: No    Non-medical: No  Tobacco Use  . Smoking status: Former Smoker    Packs/day: 0.25    Years: 8.00    Pack years: 2.00    Types: Cigarettes    Start date: 01/19/1982    Quit date: 01/19/1990    Years since quitting: 28.8  . Smokeless tobacco: Never Used  Substance and Sexual Activity  . Alcohol use: Yes    Alcohol/week: 0.0 standard drinks    Comment: occasionally  . Drug use: No  . Sexual activity: Yes    Partners: Male    Birth control/protection: Surgical  Lifestyle  . Physical activity    Days per week: 0 days    Minutes  per session: 0 min  . Stress: Not at all  Relationships  . Social connections    Talks on phone: More than three times a week    Gets together: More than three times a week    Attends religious service: More than 4 times per year    Active member of club or organization: Yes    Attends meetings of clubs or organizations: More than 4 times per year    Relationship status: Married  . Intimate partner violence    Fear of current or ex partner: No    Emotionally abused: No    Physically abused: No    Forced sexual activity: No  Other Topics Concern  . Not on file  Social History Narrative   Married; works  full time   Her daughter, [name] is a travel Marine scientist     Review of Systems: See HPI, otherwise negative ROS  Physical Exam: BP (!) 141/94   Pulse 76   Temp (!) 96 F (35.6 C) (Tympanic)   Resp 20   Ht 5\' 7"  (1.702 m)   Wt 113.4 kg   SpO2 98%   BMI 39.16 kg/m  General:   Alert,  pleasant and cooperative in NAD Head:  Normocephalic and atraumatic. Neck:  Supple; no masses or thyromegaly. Lungs:  Clear throughout to auscultation, normal respiratory effort.    Heart:  +S1, +S2, Regular rate and rhythm, No edema. Abdomen:  Soft, nontender and nondistended. Normal bowel sounds, without guarding, and without rebound.   Neurologic:  Alert and  oriented x4;  grossly normal neurologically.  Impression/Plan: Tabitha Shaw is here for a colonoscopy to be performed for history of colon polyps in 2015 Risks, benefits, limitations, and alternatives regarding  colonoscopy have been reviewed with the patient.  Questions have been answered.  All parties agreeable.   Virgel Manifold, MD  11/11/2018, 9:17 AM

## 2018-11-11 NOTE — Transfer of Care (Signed)
Immediate Anesthesia Transfer of Care Note  Patient: Tabitha Shaw  Procedure(s) Performed: COLONOSCOPY WITH PROPOFOL (N/A )  Patient Location: PACU  Anesthesia Type:General  Level of Consciousness: sedated  Airway & Oxygen Therapy: Patient Spontanous Breathing and Patient connected to nasal cannula oxygen  Post-op Assessment: Report given to RN and Post -op Vital signs reviewed and stable  Post vital signs: Reviewed and stable  Last Vitals:  Vitals Value Taken Time  BP 102/80 11/11/18 1023  Temp    Pulse 100 11/11/18 1025  Resp 16 11/11/18 1025  SpO2 98 % 11/11/18 1025  Vitals shown include unvalidated device data.  Last Pain:  Vitals:   11/11/18 0824  TempSrc: Tympanic  PainSc: 0-No pain         Complications: No apparent anesthesia complications

## 2018-11-11 NOTE — Op Note (Signed)
[facility] Patient Name: Tabitha Shaw Procedure Date: 11/11/2018 9:17 AM MRN: [id number] Account #: [id number] Date of Birth: [birth year] Admit Type: Inpatient Age: 58 Room: [facility] ENDO ROOM 2 Gender: Female Note Status: Finalized Procedure:            Colonoscopy Indications:          High risk colon cancer surveillance: Personal history                        of colonic polyps Providers:            Elbie Statzer B. Bonna Gains MD, MD Medicines:            Monitored Anesthesia Care Complications:        No immediate complications. Procedure:            Pre-Anesthesia Assessment:                       - ASA Grade Assessment: II - A patient with mild                        systemic disease.                       - Prior to the procedure, a History and Physical was                        performed, and patient medications, allergies and                        sensitivities were reviewed. The patient's tolerance of                        previous anesthesia was reviewed.                       - The risks and benefits of the procedure and the                        sedation options and risks were discussed with the                        patient. All questions were answered and informed                        consent was obtained.                       - Patient identification and proposed procedure were                        verified prior to the procedure by the physician, the                        nurse, the anesthesiologist, the anesthetist and the                        technician. The procedure was verified in the procedure                        room.  After obtaining informed consent, the colonoscope was                        passed under direct vision. Throughout the procedure,                        the patient's blood pressure, pulse, and oxygen                        saturations were monitored continuously. The                   Colonoscope was introduced through the anus and                        advanced to the the cecum, identified by appendiceal                        orifice and ileocecal valve. The colonoscopy was                        performed with ease. The patient tolerated the                        procedure well. The quality of the bowel preparation                        was good. Findings:      The perianal and digital rectal examinations were normal.      The colon (entire examined portion) was tortuous. External pressure was       applied to the patient's abdomen to assist advancement of the scope.      A 10 mm polyp was found in the cecum. The polyp was sessile. Polypectomy       was attempted, initially using a cold snare. Polyp resection was       incomplete with this device. This intervention then required a different       device and polypectomy technique. The polyp was removed with a jumbo       cold forceps. Resection and retrieval were complete. Polyp was removed       piecemeal due to its positioning. The colon was tortuous, requiring       abdominal pressure to reach the cecum, and the polyp could not be       repositioned to a position that would allow for removal in one piece.      There was a medium-sized lipoma, in the transverse colon. Biopsies were       taken with a cold forceps for histology.      A 4 mm polyp was found in the sigmoid colon. The polyp was flat. The       polyp was removed with a jumbo cold forceps. Resection and retrieval       were complete.      A few diverticula were found in the sigmoid colon.      The exam was otherwise without abnormality.      The rectum, sigmoid colon, descending colon, transverse colon, ascending       colon and cecum appeared normal.      Anal papilla(e) were hypertrophied.      The retroflexed view of the distal rectum and anal verge was normal and  showed no anal or rectal abnormalities. Impression:            - Tortuous colon.                       - One 10 mm polyp in the cecum, removed with a jumbo                        cold forceps. Resected and retrieved.                       - Medium-sized lipoma in the transverse colon. Biopsied.                       - One 4 mm polyp in the sigmoid colon, removed with a                        jumbo cold forceps. Resected and retrieved.                       - Diverticulosis in the sigmoid colon.                       - The examination was otherwise normal.                       - The rectum, sigmoid colon, descending colon,                        transverse colon, ascending colon and cecum are normal.                       - Anal papilla(e) were hypertrophied.                       - The distal rectum and anal verge are normal on                        retroflexion view. Recommendation:       - Discharge patient to home (with escort).                       - Advance diet as tolerated.                       - Continue present medications.                       - Await pathology results.                       - Repeat colonoscopy in 1 year, due to piecemeal                        polypectomy in the cecum.                       - The findings and recommendations were discussed with                        the patient.                       -  The findings and recommendations were discussed with                        the patient's family.                       - Return to primary care physician as previously                        scheduled.                       - High fiber diet. Procedure Code(s):    --- Professional ---                       [phone], Colonoscopy, flexible; with biopsy, single or                        multiple Diagnosis Code(s):    --- Professional ---                       Z86.010, Personal history of colonic polyps                       K63.5, Polyp of colon                       K57.30, Diverticulosis of large intestine without                         perforation or abscess without bleeding                       Q43.8, Other specified congenital malformations of                        intestine CPT copyright 2019 American Medical Association. All rights reserved. The codes documented in this report are preliminary and upon coder review may  be revised to meet current compliance requirements.  Vonda Antigua, MD Margretta Sidle B. Bonna Gains MD, MD 11/11/2018 10:27:27 AM This report has been signed electronically. Number of Addenda: 0 Note Initiated On: 11/11/2018 9:17 AM Scope Withdrawal Time: 0 hours 24 minutes 10 seconds  Total Procedure Duration: 0 hours 43 minutes 16 seconds  Estimated Blood Loss: Estimated blood loss: none.      [facility]

## 2018-11-14 ENCOUNTER — Encounter: Payer: Self-pay | Admitting: Gastroenterology

## 2018-11-14 LAB — SURGICAL PATHOLOGY

## 2018-11-15 ENCOUNTER — Encounter: Payer: Self-pay | Admitting: Gastroenterology

## 2018-11-18 ENCOUNTER — Other Ambulatory Visit: Payer: Self-pay

## 2018-11-18 ENCOUNTER — Ambulatory Visit (INDEPENDENT_AMBULATORY_CARE_PROVIDER_SITE_OTHER): Payer: Managed Care, Other (non HMO)

## 2018-11-18 DIAGNOSIS — Z23 Encounter for immunization: Secondary | ICD-10-CM | POA: Diagnosis not present

## 2018-11-22 ENCOUNTER — Encounter

## 2018-12-09 ENCOUNTER — Ambulatory Visit (INDEPENDENT_AMBULATORY_CARE_PROVIDER_SITE_OTHER): Payer: Managed Care, Other (non HMO) | Admitting: Family Medicine

## 2018-12-09 ENCOUNTER — Encounter: Payer: Self-pay | Admitting: Family Medicine

## 2018-12-09 ENCOUNTER — Other Ambulatory Visit: Payer: Self-pay

## 2018-12-09 DIAGNOSIS — E039 Hypothyroidism, unspecified: Secondary | ICD-10-CM

## 2018-12-09 DIAGNOSIS — Z9089 Acquired absence of other organs: Secondary | ICD-10-CM

## 2018-12-09 DIAGNOSIS — E1129 Type 2 diabetes mellitus with other diabetic kidney complication: Secondary | ICD-10-CM | POA: Diagnosis not present

## 2018-12-09 DIAGNOSIS — R413 Other amnesia: Secondary | ICD-10-CM

## 2018-12-09 DIAGNOSIS — R809 Proteinuria, unspecified: Secondary | ICD-10-CM

## 2018-12-09 DIAGNOSIS — Z794 Long term (current) use of insulin: Secondary | ICD-10-CM

## 2018-12-09 DIAGNOSIS — D126 Benign neoplasm of colon, unspecified: Secondary | ICD-10-CM

## 2018-12-09 DIAGNOSIS — Z9009 Acquired absence of other part of head and neck: Secondary | ICD-10-CM | POA: Diagnosis not present

## 2018-12-09 DIAGNOSIS — E559 Vitamin D deficiency, unspecified: Secondary | ICD-10-CM

## 2018-12-09 DIAGNOSIS — E892 Postprocedural hypoparathyroidism: Secondary | ICD-10-CM

## 2018-12-09 DIAGNOSIS — I10 Essential (primary) hypertension: Secondary | ICD-10-CM | POA: Diagnosis not present

## 2018-12-09 DIAGNOSIS — E785 Hyperlipidemia, unspecified: Secondary | ICD-10-CM

## 2018-12-09 MED ORDER — VITAMIN D (ERGOCALCIFEROL) 1.25 MG (50000 UNIT) PO CAPS: 50000.0000 [IU] | ORAL_CAPSULE | ORAL | 1 refills | Status: DC

## 2018-12-09 MED ORDER — BLOOD GLUCOSE METER KIT: PACK | 0 refills | Status: DC

## 2018-12-09 NOTE — Progress Notes (Signed)
Name: Tabitha Shaw   MRN: [id number]    DOB: 1961/01/15   Date:12/09/2018       Progress Note  Subjective  Chief Complaint  Chief Complaint  Patient presents with  . Diabetes  . Hypothyroidism  . Hypertension  . Dyslipidemia    I connected with  [name]  on 12/09/18 at  7:40 AM EST by a video enabled telemedicine application and verified that I am speaking with the correct person using two identifiers.  I discussed the limitations of evaluation and management by telemedicine and the availability of in person appointments. The patient expressed understanding and agreed to proceed. Staff also discussed with the patient that there may be a patient responsible charge related to this service. Patient Location: at home  Provider Location: [facility]   HPI  DMII :hgbA1C spiked since last visit up to 8.1% She states glucose at home has been in the 120's-140'sSheis now on Ozempic and Metformin , she states her diet has improved Denies polyphagia, polydipsia or polyuria, but she has nocturia once per night. Taking benazepril for microalbuminuria. Last LDL also went up to 162 but she is now on Crestor 40 and we will recheck labs   HTN: taking medication daily and denies side effects, she was not able to check bp at home,  no chest pain or palpitationordizziness.Continue Lotrel 5/20, she needs a refill   Hyperlipidemia: she was taking Atorvastatin but LDL was high at 162 , so we August we switched to Crestor ,denies myalgia,she has been compliant since she started taking medication every morning.  Hypothyroidism: she is taking Synthroid42mgdaily , no change in bowel movement of dry skin  Morbid obesity::her weight has been stable since last year. Still on GLP-1 agonist, she is also on Metformin , she is not able to check weigh at home, explained importance of getting a scale  PrimaryHyperparathyroidism: seen by Dr. SGabriel Carinaand referred  to Dr. KMaudie Mercury found to have a right lower parathyroid enlargement and had surgery on 11/26/2017, and has been released since, we will recheck level since vitamin D is very low despite taking rx twice a week    Hemochromatosis : sees Dr FGrayland Ormondand has phlebotomy.She has a follow up with hematologist in December  Memory difficulties: she is worried because daughter noticed she has been forgetful, there is a family history of Alzheimer's dementia, and her 23&Me test was positive. Discussed MMS, signs and symptoms of Alzheimer's and need to do mental exercises, download Luminosity app...  Patient Active Problem List   Diagnosis Date Noted  . Personal history of colonic polyps   . Polyp of colon   . Cervical high risk HPV (human papillomavirus) test positive 09/03/2016  . B12 deficiency 09/04/2015  . Elevated PTHrP level 09/04/2015  . Benign hypertension 09/12/2014  . Acanthosis nigricans 09/09/2014  . Diabetes mellitus with renal manifestation ([place] 09/09/2014  . Dyslipidemia 09/09/2014  . Abnormal electrocardiogram 09/09/2014  . Enlarged kidney 09/09/2014  . 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[place] 09/09/2014  . Enlarged liver 09/09/2014  . Calcium blood increased 09/09/2014  . Adult hypothyroidism 09/09/2014  . Microalbuminuria 09/09/2014  . Morbid obesity due to excess calories ([place] 09/09/2014  . Spinal stenosis of lumbar region with neurogenic claudication 09/09/2014  . Osteopenia 09/09/2014  . Vitamin D deficiency 09/09/2014    Past Surgical History:  Procedure Laterality Date  . ABDOMINAL HYSTERECTOMY  2005  . blood transfusion yearly    . BREAST REDUCTION SURGERY    .  CESAREAN SECTION    . CESAREAN SECTION  1992  . CHOLECYSTECTOMY  2000  . COLONOSCOPY WITH PROPOFOL N/A 11/11/2018   Procedure: COLONOSCOPY WITH PROPOFOL;  Surgeon: Virgel Manifold, MD;  Location: [facility] ENDOSCOPY;  Service: Endoscopy;  Laterality: N/A;  . lapcholecystectomy    . PARATHYROIDECTOMY Right 11/26/2017    right lower , Dr. Maudie Mercury at [facility]  . REDUCTION MAMMAPLASTY Bilateral 1985  . VESICOVAGINAL FISTULA CLOSURE W/ TAH      Family History  Problem Relation Age of Onset  . Colon polyps Mother   . Hemachromatosis Mother   . Hypertension Mother   . Depression Sister   . Hypertension Brother   . Colon polyps Brother   . Cancer Father   . Breast cancer Cousin   . Breast cancer Cousin     Social History   Socioeconomic History  . Marital status: Married    Spouse name: [name]   . Number of children: 1  . Years of education: Not on file  . Highest education level: Some college, no degree  Occupational History  . Occupation: customer services     Comment: [place]  . Financial resource strain: Not hard at all  . Food insecurity    Worry: Never true    Inability: Never true  . Transportation needs    Medical: No    Non-medical: No  Tobacco Use  . Smoking status: Former Smoker    Packs/day: 0.25    Years: 8.00    Pack years: 2.00    Types: Cigarettes    Start date: 01/19/1982    Quit date: 01/19/1990    Years since quitting: 28.9  . Smokeless tobacco: Never Used  Substance and Sexual Activity  . Alcohol use: Yes    Alcohol/week: 0.0 standard drinks    Comment: occasionally  . Drug use: No  . Sexual activity: Yes    Partners: Male    Birth control/protection: Surgical  Lifestyle  . Physical activity    Days per week: 0 days    Minutes per session: 0 min  . Stress: Not at all  Relationships  . Social connections    Talks on phone: More than three times a week    Gets together: More than three times a week    Attends religious service: More than 4 times per year    Active member of club or organization: Yes    Attends meetings of clubs or organizations: More than 4 times per year    Relationship status: Married  . Intimate partner violence    Fear of current or ex partner: No    Emotionally abused: No    Physically abused: No    Forced sexual activity: No   Other Topics Concern  . Not on file  Social History Narrative   Married; works  full time   Her daughter, [name] is a travel Marine scientist      Current Outpatient Medications:  .  amLODipine-benazepril (LOTREL) 5-20 MG capsule, TAKE 1 CAPSULE BY MOUTH  DAILY, Disp: 90 capsule, Rfl: 1 .  aspirin 81 MG EC tablet, Take 81 mg by mouth daily.  , Disp: , Rfl:  .  clobetasol (TEMOVATE) 0.05 % external solution, APP TO SCALP BID, Disp: , Rfl:  .  fluticasone (FLONASE) 50 MCG/ACT nasal spray, Place 2 sprays into both nostrils daily., Disp: 48 g, Rfl: 0 .  hydroquinone 4 % cream, APP EXT TO FACE Q NIGHT, Disp: , Rfl:  .  ketoconazole (NIZORAL) 2 % shampoo, APP EXT AA 3 TIMES A WK UTD, Disp: , Rfl:  .  levothyroxine (SYNTHROID) 75 MCG tablet, Take 1 tablet (75 mcg total) by mouth daily., Disp: 90 tablet, Rfl: 1 .  metFORMIN (GLUCOPHAGE-XR) 750 MG 24 hr tablet, TAKE 1 TABLET BY MOUTH  DAILY WITH BREAKFAST, Disp: 90 tablet, Rfl: 3 .  ONE TOUCH ULTRA TEST test strip, CHECK FASTING BLOOD SUGAR  TWICE WEEKLY, Disp: 100 each, Rfl: 1 .  pimecrolimus (ELIDEL) 1 % cream, APP EXT AA BID, Disp: , Rfl:  .  rosuvastatin (CRESTOR) 40 MG tablet, Take 1 tablet (40 mg total) by mouth daily., Disp: 90 tablet, Rfl: 1 .  Semaglutide, 1 MG/DOSE, (OZEMPIC, 1 MG/DOSE,) 2 MG/1.5ML SOPN, Inject 1 mg into the skin once a week., Disp: 6 pen, Rfl: 1 .  [START ON 12/12/2018] Vitamin D, Ergocalciferol, (DRISDOL) 1.25 MG (50000 UT) CAPS capsule, Take 1 capsule (50,000 Units total) by mouth 2 (two) times a week. Take 2 every week, Disp: 24 capsule, Rfl: 1 .  blood glucose meter kit and supplies, Dispense based on patient and insurance preference. Use up to four times daily as directed. (FOR ICD-10 E10.9, E11.9)., Disp: 1 each, Rfl: 0 .  loratadine (CLARITIN) 10 MG tablet, Take 1 tablet (10 mg total) by mouth daily. (Patient not taking: Reported on 12/09/2018), Disp: 30 tablet, Rfl: 0  No Known Allergies  I personally reviewed active  problem list, medication list, allergies, family history, social history, health maintenance with the patient/caregiver today.   ROS  Ten systems reviewed and is negative except as mentioned in HPI   Objective  Virtual encounter, vitals not obtained.  There is no height or weight on file to calculate BMI.  Physical Exam  Awake, alert and oriented  PHQ2/9: Depression screen [facility] 2/9 12/09/2018 09/07/2018 03/21/2018 12/03/2017 08/31/2017  Decreased Interest 0 0 0 0 0  Down, Depressed, Hopeless 0 0 0 0 0  PHQ - 2 Score 0 0 0 0 0  Altered sleeping 0 0 0 0 -  Tired, decreased energy 0 1 0 0 -  Change in appetite 0 1 0 0 -  Feeling bad or failure about yourself  0 0 0 0 -  Trouble concentrating 0 0 0 0 -  Moving slowly or fidgety/restless 0 0 0 0 -  Suicidal thoughts 0 0 0 - -  PHQ-9 Score 0 2 0 0 -  Difficult doing work/chores - Not difficult at all Not difficult at all Not difficult at all -   PHQ-2/9 Result is negative.    Fall Risk: Fall Risk  12/09/2018 09/07/2018 03/21/2018 12/03/2017 05/17/2017  Falls in the past year? 0 0 0 0 No  Number falls in past yr: 0 0 0 - -  Injury with Fall? 0 0 0 - -     Assessment & Plan  1. Type 2 diabetes mellitus with microalbuminuria, with long-term current use of insulin (HCC)  - blood glucose meter kit and supplies; Dispense based on patient and insurance preference. Use up to four times daily as directed. (FOR ICD-10 E10.9, E11.9).  Dispense: 1 each; Refill: 0 - Hemoglobin A1c  2. Benign hypertension  - Comprehensive metabolic panel  3. Adult hypothyroidism   4. History of parathyroidectomy  - Parathyroid hormone, intact (no Ca)  5. Morbid obesity due to excess calories [facility])  Discussed with the patient the risk posed by an increased BMI. Discussed importance of portion control, calorie counting and at  least 150 minutes of physical activity weekly. Avoid sweet beverages and drink more water. Eat at least 6 servings of fruit and  vegetables daily   6. 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[place])  Keep follow up with hematologist   7. Dyslipidemia  On crestor and we will recheck labs   8. Vitamin D deficiency  - Vitamin D, Ergocalciferol, (DRISDOL) 1.25 MG (50000 UT) CAPS capsule; Take 1 capsule (50,000 Units total) by mouth 2 (two) times a week. Take 2 every week  Dispense: 24 capsule; Refill: 1 - Parathyroid hormone, intact (no Ca)  9. Tubular adenoma of colon  She will go for repeat   10. Memory difficulty  She did a 23 and Me test and was positive for Alzheimer's , she also has a family history of Alzhemeir's, advised to return for MMS , monitor for symptoms and we will refer her to neurologist if needed   I discussed the assessment and treatment plan with the patient. The patient was provided an opportunity to ask questions and all were answered. The patient agreed with the plan and demonstrated an understanding of the instructions.  The patient was advised to call back or seek an in-person evaluation if the symptoms worsen or if the condition fails to improve as anticipated.  I provided 25  minutes of non-face-to-face time during this encounter.

## 2018-12-12 ENCOUNTER — Encounter: Payer: Self-pay | Admitting: Family Medicine

## 2018-12-22 ENCOUNTER — Encounter: Payer: Self-pay | Admitting: Oncology

## 2018-12-27 ENCOUNTER — Inpatient Hospital Stay: Payer: Managed Care, Other (non HMO) | Admitting: Oncology

## 2018-12-27 ENCOUNTER — Encounter: Payer: Self-pay | Admitting: Oncology

## 2018-12-29 ENCOUNTER — Inpatient Hospital Stay: Payer: Managed Care, Other (non HMO) | Attending: Oncology

## 2018-12-29 ENCOUNTER — Other Ambulatory Visit: Payer: Self-pay

## 2019-02-21 ENCOUNTER — Other Ambulatory Visit: Payer: Self-pay | Admitting: Family Medicine

## 2019-02-21 NOTE — Telephone Encounter (Signed)
Requested Prescriptions  Pending Prescriptions Disp Refills  . OZEMPIC, 1 MG/DOSE, 2 MG/1.5ML SOPN [Pharmacy Med Name: OZEMPIC 1MG  PER DOSE (2X2MG  PEN)] 9 mL     Sig: INJECT 1 MG UNDER THE SKIN ONCE A WEEK     Endocrinology:  Diabetes - GLP-1 Receptor Agonists Failed - 02/21/2019  3:38 AM      Failed - HBA1C is between 0 and 7.9 and within 180 days    HbA1c, POC (controlled diabetic range)  Date Value Ref Range Status  12/03/2017 6.9 0.0 - 7.0 % Final   Hgb A1c MFr Bld  Date Value Ref Range Status  09/05/2018 8.1 (H) 4.8 - 5.6 % Final    Comment:             Prediabetes: 5.7 - 6.4          Diabetes: >6.4          Glycemic control for adults with diabetes: <7.0          Passed - Valid encounter within last 6 months    Recent Outpatient Visits          2 months ago Type 2 diabetes mellitus with microalbuminuria, with long-term current use of insulin [facility])   [facility], Drue Stager, MD   5 months ago Type 2 diabetes mellitus with microalbuminuria, with long-term current use of insulin [facility])   [facility] Steele Sizer, MD   11 months ago Viral upper respiratory tract infection   [place], NP   1 year ago Adult hypothyroidism   [facility] Steele Sizer, MD   1 year ago Well woman exam   [facility] Steele Sizer, MD

## 2019-03-04 ENCOUNTER — Other Ambulatory Visit: Payer: Self-pay | Admitting: Family Medicine

## 2019-03-04 DIAGNOSIS — E039 Hypothyroidism, unspecified: Secondary | ICD-10-CM

## 2019-03-04 NOTE — Telephone Encounter (Signed)
Requested Prescriptions  Pending Prescriptions Disp Refills  . levothyroxine (SYNTHROID) 75 MCG tablet [Pharmacy Med Name: LEVOTHYROXINE 0.075MG  (75MCG) TABS] 90 tablet 1    Sig: TAKE 1 TABLET BY MOUTH DAILY     Endocrinology:  Hypothyroid Agents Failed - 03/04/2019  9:38 AM      Failed - TSH needs to be rechecked within 3 months after an abnormal result. Refill until TSH is due.      Passed - TSH in normal range and within 360 days    TSH  Date Value Ref Range Status  09/05/2018 1.360 0.450 - 4.500 uIU/mL Final         Passed - Valid encounter within last 12 months    Recent Outpatient Visits          2 months ago Type 2 diabetes mellitus with microalbuminuria, with long-term current use of insulin [facility])   [facility], Drue Stager, MD   5 months ago Type 2 diabetes mellitus with microalbuminuria, with long-term current use of insulin [facility])   [facility] Steele Sizer, MD   11 months ago Viral upper respiratory tract infection   [place], NP   1 year ago Adult hypothyroidism   [facility] Steele Sizer, MD   1 year ago Well woman exam   [facility] Steele Sizer, MD

## 2019-03-18 ENCOUNTER — Encounter: Payer: Self-pay | Admitting: Family Medicine

## 2019-03-20 ENCOUNTER — Other Ambulatory Visit: Payer: Self-pay

## 2019-03-20 DIAGNOSIS — E559 Vitamin D deficiency, unspecified: Secondary | ICD-10-CM

## 2019-03-20 MED ORDER — VITAMIN D (ERGOCALCIFEROL) 1.25 MG (50000 UNIT) PO CAPS: 50000.0000 [IU] | ORAL_CAPSULE | ORAL | 1 refills | Status: DC

## 2019-03-21 ENCOUNTER — Telehealth: Payer: Self-pay

## 2019-03-21 NOTE — Telephone Encounter (Signed)
Copied from [place] [id number]. Topic: General - Inquiry >> Mar 21, 2019  8:28 AM Lennox Solders wrote: Reason for CRM: pt has an appt with dr Ancil Boozer on 04-19-2019. Pt works at [employer] and would like order to have a1c, chole and any other blood work dr Allie Bossier would like her to have. Pt will come pick up order

## 2019-03-23 NOTE — Telephone Encounter (Signed)
Call to patient- patient would like a printed lab order- she is going to come to office and pick it up today.

## 2019-03-23 NOTE — Telephone Encounter (Signed)
Pt notified is picking up today.

## 2019-03-28 ENCOUNTER — Encounter: Payer: Self-pay | Admitting: Oncology

## 2019-03-28 ENCOUNTER — Other Ambulatory Visit: Payer: Self-pay | Admitting: Emergency Medicine

## 2019-03-29 ENCOUNTER — Other Ambulatory Visit: Payer: Self-pay

## 2019-03-29 ENCOUNTER — Inpatient Hospital Stay: Payer: Managed Care, Other (non HMO) | Attending: Oncology

## 2019-03-29 ENCOUNTER — Inpatient Hospital Stay: Payer: Managed Care, Other (non HMO)

## 2019-04-09 ENCOUNTER — Encounter: Payer: Self-pay | Admitting: Family Medicine

## 2019-04-09 DIAGNOSIS — E559 Vitamin D deficiency, unspecified: Secondary | ICD-10-CM

## 2019-04-10 MED ORDER — VITAMIN D (ERGOCALCIFEROL) 1.25 MG (50000 UNIT) PO CAPS: 50000.0000 [IU] | ORAL_CAPSULE | ORAL | 1 refills | Status: DC

## 2019-04-19 ENCOUNTER — Ambulatory Visit: Payer: Managed Care, Other (non HMO) | Admitting: Family Medicine

## 2019-05-12 ENCOUNTER — Other Ambulatory Visit: Payer: Self-pay | Admitting: Family Medicine

## 2019-05-12 LAB — COMPREHENSIVE METABOLIC PANEL
Albumin: 4.8 (ref 3.5–5.0)
Calcium: 7.6 — AB (ref 8.7–10.7)

## 2019-05-12 LAB — BASIC METABOLIC PANEL
BUN: 11 (ref 4–21)
Chloride: 100 (ref 99–108)
Creatinine: 0.9 (ref 0.5–1.1)
Glucose: 123
Potassium: 4.5 (ref 3.4–5.3)
Sodium: 141 (ref 137–147)

## 2019-05-12 LAB — LIPID PANEL
Cholesterol: 139 (ref 0–200)
HDL: 42 (ref 35–70)
LDL Cholesterol: 21
Triglycerides: 115 (ref 40–160)

## 2019-05-12 LAB — HEPATIC FUNCTION PANEL
ALT: 24 (ref 7–35)
AST: 25 (ref 13–35)
Alkaline Phosphatase: 72 (ref 25–125)

## 2019-05-12 LAB — HEMOGLOBIN A1C: Hemoglobin A1C: 6

## 2019-05-13 LAB — PARATHYROID HORMONE, INTACT (NO CA): PTH: 34 pg/mL (ref 15–65)

## 2019-05-13 LAB — COMPREHENSIVE METABOLIC PANEL
ALT: 24 IU/L (ref 0–32)
AST: 25 IU/L (ref 0–40)
Albumin/Globulin Ratio: 1.7 (ref 1.2–2.2)
Albumin: 4.8 g/dL (ref 3.8–4.9)
Alkaline Phosphatase: 72 IU/L (ref 39–117)
BUN/Creatinine Ratio: 14 (ref 9–23)
BUN: 11 mg/dL (ref 6–24)
Bilirubin Total: 0.8 mg/dL (ref 0.0–1.2)
CO2: 27 mmol/L (ref 20–29)
Calcium: 10.1 mg/dL (ref 8.7–10.2)
Chloride: 100 mmol/L (ref 96–106)
Creatinine, Ser: 0.81 mg/dL (ref 0.57–1.00)
GFR calc Af Amer: 94 mL/min/{1.73_m2} (ref >59)
GFR calc non Af Amer: 81 mL/min/{1.73_m2} (ref >59)
Globulin, Total: 2.8 g/dL (ref 1.5–4.5)
Glucose: 123 mg/dL — ABNORMAL HIGH (ref 65–99)
Potassium: 4.5 mmol/L (ref 3.5–5.2)
Sodium: 141 mmol/L (ref 134–144)
Total Protein: 7.6 g/dL (ref 6.0–8.5)

## 2019-05-13 LAB — LIPID PANEL
Chol/HDL Ratio: 3.3 ratio (ref 0.0–4.4)
Cholesterol, Total: 139 mg/dL (ref 100–199)
HDL: 42 mg/dL (ref >39)
LDL Chol Calc (NIH): 76 mg/dL (ref 0–99)
Triglycerides: 115 mg/dL (ref 0–149)
VLDL Cholesterol Cal: 21 mg/dL (ref 5–40)

## 2019-05-13 LAB — HEMOGLOBIN A1C
Est. average glucose Bld gHb Est-mCnc: 126 mg/dL
Hgb A1c MFr Bld: 6 % — ABNORMAL HIGH (ref 4.8–5.6)

## 2019-05-15 ENCOUNTER — Encounter: Payer: Self-pay | Admitting: Family Medicine

## 2019-05-15 ENCOUNTER — Ambulatory Visit: Payer: Managed Care, Other (non HMO) | Admitting: Family Medicine

## 2019-05-15 ENCOUNTER — Other Ambulatory Visit: Payer: Self-pay | Admitting: Family Medicine

## 2019-05-15 ENCOUNTER — Other Ambulatory Visit: Payer: Self-pay

## 2019-05-15 VITALS — BP 112/74 | HR 80 | Temp 96.9°F | Resp 16 | Ht 67.0 in | Wt 246.8 lb

## 2019-05-15 DIAGNOSIS — E538 Deficiency of other specified B group vitamins: Secondary | ICD-10-CM

## 2019-05-15 DIAGNOSIS — E039 Hypothyroidism, unspecified: Secondary | ICD-10-CM | POA: Diagnosis not present

## 2019-05-15 DIAGNOSIS — E559 Vitamin D deficiency, unspecified: Secondary | ICD-10-CM | POA: Diagnosis not present

## 2019-05-15 DIAGNOSIS — R809 Proteinuria, unspecified: Secondary | ICD-10-CM

## 2019-05-15 DIAGNOSIS — Z9009 Acquired absence of other part of head and neck: Secondary | ICD-10-CM

## 2019-05-15 DIAGNOSIS — I10 Essential (primary) hypertension: Secondary | ICD-10-CM | POA: Diagnosis not present

## 2019-05-15 DIAGNOSIS — E1129 Type 2 diabetes mellitus with other diabetic kidney complication: Secondary | ICD-10-CM | POA: Diagnosis not present

## 2019-05-15 DIAGNOSIS — E785 Hyperlipidemia, unspecified: Secondary | ICD-10-CM

## 2019-05-15 DIAGNOSIS — Z794 Long term (current) use of insulin: Secondary | ICD-10-CM

## 2019-05-15 DIAGNOSIS — E892 Postprocedural hypoparathyroidism: Secondary | ICD-10-CM

## 2019-05-15 LAB — POCT UA - MICROALBUMIN: Microalbumin Ur, POC: 100 mg/L

## 2019-05-15 MED ORDER — AMLODIPINE BESY-BENAZEPRIL HCL 5-20 MG PO CAPS: 1.0000 | ORAL_CAPSULE | Freq: Every day | ORAL | 1 refills | Status: DC

## 2019-05-15 MED ORDER — OZEMPIC (1 MG/DOSE) 2 MG/1.5ML ~~LOC~~ SOPN: 1.0000 mg | PEN_INJECTOR | SUBCUTANEOUS | 0 refills | Status: DC

## 2019-05-15 NOTE — Telephone Encounter (Signed)
Requested medication (s) are due for refill today: yes  Requested medication (s) are on the active medication list: yes  Last refill:  02/21/2019  Future visit scheduled: yes  Notes to clinic:  patient has appointment today Wanted to send just incase of any change to medication   Requested Prescriptions  Pending Prescriptions Disp Refills   OZEMPIC, 1 MG/DOSE, 2 MG/1.5ML SOPN [Pharmacy Med Name: OZEMPIC 1MG  PER DOSE (2X2MG  PEN)] 9 mL 0    Sig: INJECT 1 MG UNDER THE SKIN ONCE A WEEK      Endocrinology:  Diabetes - GLP-1 Receptor Agonists Failed - 05/15/2019  3:40 AM      Failed - HBA1C is between 0 and 7.9 and within 180 days    HbA1c, POC (controlled diabetic range)  Date Value Ref Range Status  12/03/2017 6.9 0.0 - 7.0 % Final   Hgb A1c MFr Bld  Date Value Ref Range Status  09/05/2018 8.1 (H) 4.8 - 5.6 % Final    Comment:             Prediabetes: 5.7 - 6.4          Diabetes: >6.4          Glycemic control for adults with diabetes: <7.0           Passed - Valid encounter within last 6 months    Recent Outpatient Visits           5 months ago Type 2 diabetes mellitus with microalbuminuria, with long-term current use of insulin [facility])   [facility], Drue Stager, MD   8 months ago Type 2 diabetes mellitus with microalbuminuria, with long-term current use of insulin [facility])   [facility] Steele Sizer, MD   1 year ago Viral upper respiratory tract infection   [place], [place], NP   1 year ago Adult hypothyroidism   [facility] Steele Sizer, MD   1 year ago Well woman exam   [facility] Steele Sizer, MD       Future Appointments             Today Steele Sizer, MD [facility]

## 2019-05-15 NOTE — Addendum Note (Signed)
Addended by: Inda Coke on: 05/15/2019 04:01 PM   Modules accepted: Orders

## 2019-05-15 NOTE — Progress Notes (Signed)
Name: Tabitha Shaw   MRN: [id number]    DOB: 03-20-1960   Date:05/15/2019       Progress Note  Subjective  Chief Complaint  Chief Complaint  Patient presents with  . Medication Refill  . Diabetes  . Hypertension    Denies any symptoms  . Hypothyroidism  . Hyperlipidemia  . Primary Hyperparathyroidism  . Morbid obesity    HPI  DMII :hgbA1Cspiked to 8.1% back in August 2020 but today is down to 6 %She states glucose at home has been in the120's-130's now [facility] now on Ozempic and Metformin. She states her diet is better that she was surprised her A1C is so well controlled.  Denies polyphagia, polydipsia or polyuria, but she has nocturia once per night - stable. She has been taking benazepril for microalbuminuria. LastLDL had gone  up to 162 and last week down to 76 . We will check urine micro today   HTN: taking medication daily and denies side effects,she was not able to check bp at home, no chest pain or palpitationordizziness.Continue Lotrel 5/20, she needs a refill   Hyperlipidemia: she was taking Atorvastatin but LDL was high at 162 , so we August we switched to Crestor seems to be working better with LDL down to 76 .  Hypothyroidism: she is taking Synthroid75mgdaily , no change in bowel movement of dry skin, denies dysphagia.   Morbid obesity::her weight has lost 15 lbs in the past year, doing well on GLP-1 agonist and also taking Metformin , she is walking three days a week for at least 30 minutes. She has been drinking more water, it has been easier since she started working from home, eating out less often   PrimaryHyperparathyroidism: seen by Dr. SGabriel Carinaand referred to Dr. KMaudie Mercury found to have a right lower parathyroid enlargement and had surgery on 11/26/2017,and has been released since, last Pth was normal this past week, vitamin D last checked also within normal limits   Hemochromatosis : sees Dr FGrayland Ormondand has phlebotomy. Last treatment  was 03/2019,removal of 300 ml   Memory difficulties: she is worried because daughter noticed she has been forgetful, there is a family history of Alzheimer's dementia, and her 23&Me test was positive. Discussed MMS, signs and symptoms of Alzheimer's and need to do mental exercises, she has downloaded some memory games on her phone   Patient Active Problem List   Diagnosis Date Noted  . Personal history of colonic polyps   . Polyp of colon   . Cervical high risk HPV (human papillomavirus) test positive 09/03/2016  . B12 deficiency 09/04/2015  . Elevated PTHrP level 09/04/2015  . Benign hypertension 09/12/2014  . Acanthosis nigricans 09/09/2014  . Diabetes mellitus with renal manifestation ([place] 09/09/2014  . Dyslipidemia 09/09/2014  . Abnormal electrocardiogram 09/09/2014  . Enlarged kidney 09/09/2014  . 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[place] 09/09/2014  . Enlarged liver 09/09/2014  . Calcium blood increased 09/09/2014  . Adult hypothyroidism 09/09/2014  . Microalbuminuria 09/09/2014  . Morbid obesity due to excess calories ([place] 09/09/2014  . Spinal stenosis of lumbar region with neurogenic claudication 09/09/2014  . Osteopenia 09/09/2014  . Vitamin D deficiency 09/09/2014    Past Surgical History:  Procedure Laterality Date  . ABDOMINAL HYSTERECTOMY  2005  . blood transfusion yearly    . BREAST REDUCTION SURGERY    . CESAREAN SECTION    . CESAREAN SECTION  1992  . CHOLECYSTECTOMY  2000  . COLONOSCOPY WITH PROPOFOL N/A 11/11/2018   Procedure: COLONOSCOPY  WITH PROPOFOL;  Surgeon: Virgel Manifold, MD;  Location: [facility] ENDOSCOPY;  Service: Endoscopy;  Laterality: N/A;  . lapcholecystectomy    . PARATHYROIDECTOMY Right 11/26/2017   right lower , Dr. Maudie Mercury at [facility]  . REDUCTION MAMMAPLASTY Bilateral 1985  . VESICOVAGINAL FISTULA CLOSURE W/ TAH      Family History  Problem Relation Age of Onset  . Colon polyps Mother   . Hemachromatosis Mother   . Hypertension Mother   .  Depression Sister   . Hypertension Brother   . Colon polyps Brother   . Cancer Father   . Breast cancer Cousin   . Breast cancer Cousin     Social History   Tobacco Use  . Smoking status: Former Smoker    Packs/day: 0.25    Years: 8.00    Pack years: 2.00    Types: Cigarettes    Start date: 01/19/1982    Quit date: 01/19/1990    Years since quitting: 29.3  . Smokeless tobacco: Never Used  Substance Use Topics  . Alcohol use: Yes    Alcohol/week: 0.0 standard drinks    Comment: occasionally     Current Outpatient Medications:  .  amLODipine-benazepril (LOTREL) 5-20 MG capsule, Take 1 capsule by mouth daily., Disp: 90 capsule, Rfl: 1 .  aspirin 81 MG EC tablet, Take 81 mg by mouth daily.  , Disp: , Rfl:  .  blood glucose meter kit and supplies, Dispense based on patient and insurance preference. Use up to four times daily as directed. (FOR ICD-10 E10.9, E11.9)., Disp: 1 each, Rfl: 0 .  clobetasol (TEMOVATE) 0.05 % external solution, APP TO SCALP BID, Disp: , Rfl:  .  fluticasone (FLONASE) 50 MCG/ACT nasal spray, Place 2 sprays into both nostrils daily., Disp: 48 g, Rfl: 0 .  hydroquinone 4 % cream, APP EXT TO FACE Q NIGHT, Disp: , Rfl:  .  ketoconazole (NIZORAL) 2 % shampoo, APP EXT AA 3 TIMES A WK UTD, Disp: , Rfl:  .  levothyroxine (SYNTHROID) 75 MCG tablet, TAKE 1 TABLET BY MOUTH DAILY, Disp: 90 tablet, Rfl: 1 .  loratadine (CLARITIN) 10 MG tablet, Take 1 tablet (10 mg total) by mouth daily., Disp: 30 tablet, Rfl: 0 .  meloxicam (MOBIC) 7.5 MG tablet, Mobic 7.5 mg tablet  Take 1 tablet twice a day by oral route., Disp: , Rfl:  .  metFORMIN (GLUCOPHAGE-XR) 750 MG 24 hr tablet, TAKE 1 TABLET BY MOUTH  DAILY WITH BREAKFAST, Disp: 90 tablet, Rfl: 3 .  ONE TOUCH ULTRA TEST test strip, CHECK FASTING BLOOD SUGAR  TWICE WEEKLY, Disp: 100 each, Rfl: 1 .  pimecrolimus (ELIDEL) 1 % cream, APP EXT AA BID, Disp: , Rfl:  .  rosuvastatin (CRESTOR) 40 MG tablet, TAKE 1 TABLET BY MOUTH DAILY,  Disp: 90 tablet, Rfl: 1 .  Semaglutide, 1 MG/DOSE, (OZEMPIC, 1 MG/DOSE,) 2 MG/1.5ML SOPN, Inject 1 mg into the skin once a week., Disp: 9 mL, Rfl: 0 .  Vitamin D, Ergocalciferol, (DRISDOL) 1.25 MG (50000 UNIT) CAPS capsule, Take 1 capsule (50,000 Units total) by mouth 2 (two) times a week. Take 2 every week, Disp: 24 capsule, Rfl: 1  No Known Allergies  I personally reviewed active problem list, medication list, allergies, family history, social history, health maintenance with the patient/caregiver today.   ROS  Constitutional: Negative for fever , positive for weight change.  Respiratory: Negative for cough and shortness of breath.   Cardiovascular: Negative for chest pain or palpitations.  Gastrointestinal:  Negative for abdominal pain, no bowel changes.  Musculoskeletal: Negative for gait problem or joint swelling.  Skin: Negative for rash.  Neurological: Negative for dizziness or headache.  No other specific complaints in a complete review of systems (except as listed in HPI above).  Objective  Vitals:   05/15/19 1508  BP: 112/74  Pulse: 80  Resp: 16  Temp: (!) 96.9 F (36.1 C)  TempSrc: Temporal  SpO2: 99%  Weight: 246 lb 12.8 oz (111.9 kg)  Height: _0  (1.702 m)    Body mass index is 38.65 kg/m.  Physical Exam  Constitutional: Patient appears well-developed and well-nourished. Obese  No distress.  HEENT: head atraumatic, normocephalic, pupils equal and reactive to light,  neck supple, throat within normal limits Cardiovascular: Normal rate, regular rhythm and normal heart sounds.  No murmur heard. No BLE edema. Pulmonary/Chest: Effort normal and breath sounds normal. No respiratory distress. Abdominal: Soft.  There is no tenderness. Psychiatric: Patient has a normal mood and affect. behavior is normal. Judgment and thought content normal.  Recent Results (from the past 2160 hour(s))  Basic metabolic panel     Status: None   Collection Time: 05/12/19 12:00 AM   Result Value Ref Range   Glucose 123    BUN 11 4 - 21   Creatinine 0.9 0.5 - 1.1   Potassium 4.5 3.4 - 5.3   Sodium 141 137 - 147   Chloride 100 99 - 108  Comprehensive metabolic panel     Status: Abnormal   Collection Time: 05/12/19 12:00 AM  Result Value Ref Range   Calcium 7.6 (A) 8.7 - 10.7   Albumin 4.8 3.5 - 5.0  Lipid panel     Status: None   Collection Time: 05/12/19 12:00 AM  Result Value Ref Range   Triglycerides 115 40 - 160   Cholesterol 139 0 - 200   HDL 42 35 - 70   LDL Cholesterol 21   Hepatic function panel     Status: None   Collection Time: 05/12/19 12:00 AM  Result Value Ref Range   Alkaline Phosphatase 72 25 - 125   ALT 24 7 - 35   AST 25 13 - 35  Hemoglobin A1c     Status: None   Collection Time: 05/12/19 12:00 AM  Result Value Ref Range   Hemoglobin A1C 6.0     Diabetic Foot Exam: Diabetic Foot Exam - Simple   Simple Foot Form Diabetic Foot exam was performed with the following findings: Yes 05/15/2019  3:52 PM  Visual Inspection No deformities, no ulcerations, no other skin breakdown bilaterally: Yes Sensation Testing Intact to touch and monofilament testing bilaterally: Yes Pulse Check Posterior Tibialis and Dorsalis pulse intact bilaterally: Yes Comments      PHQ2/9: Depression screen [facility] 2/9 05/15/2019 12/09/2018 09/07/2018 03/21/2018 12/03/2017  Decreased Interest 0 0 0 0 0  Down, Depressed, Hopeless 0 0 0 0 0  PHQ - 2 Score 0 0 0 0 0  Altered sleeping 1 0 0 0 0  Tired, decreased energy 0 0 1 0 0  Change in appetite 0 0 1 0 0  Feeling bad or failure about yourself  0 0 0 0 0  Trouble concentrating 0 0 0 0 0  Moving slowly or fidgety/restless 0 0 0 0 0  Suicidal thoughts 0 0 0 0 -  PHQ-9 Score 1 0 2 0 0  Difficult doing work/chores Not difficult at all - Not difficult at all Not difficult  at all Not difficult at all    phq 9 is negative   Fall Risk: Fall Risk  05/15/2019 12/09/2018 09/07/2018 03/21/2018 12/03/2017  Falls in the past  year? 0 0 0 0 0  Number falls in past yr: 0 0 0 0 -  Injury with Fall? 0 0 0 0 -    Functional Status Survey: Is the patient deaf or have difficulty hearing?: No Does the patient have difficulty seeing, even when wearing glasses/contacts?: No Does the patient have difficulty concentrating, remembering, or making decisions?: No Does the patient have difficulty walking or climbing stairs?: No Does the patient have difficulty dressing or bathing?: No Does the patient have difficulty doing errands alone such as visiting a doctor's office or shopping?: No    Assessment & Plan  1. Type 2 diabetes mellitus with microalbuminuria, with long-term current use of insulin (HCC)  - Semaglutide, 1 MG/DOSE, (OZEMPIC, 1 MG/DOSE,) 2 MG/1.5ML SOPN; Inject 1 mg into the skin once a week.  Dispense: 9 mL; Refill: 0  2. Adult hypothyroidism  We will add TSH to her labs  3. History of parathyroidectomy  Doing well   4. Benign hypertension  - amLODipine-benazepril (LOTREL) 5-20 MG capsule; Take 1 capsule by mouth daily.  Dispense: 90 capsule; Refill: 1  5. Morbid obesity due to excess calories [facility])  Discussed with the patient the risk posed by an increased BMI. Discussed importance of portion control, calorie counting and at least 150 minutes of physical activity weekly. Avoid sweet beverages and drink more water. Eat at least 6 servings of fruit and vegetables daily   6. Dyslipidemia  Continue crestor   7. 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[place])   8. B12 deficiency  Increase supplementation to three times a week   9. Vitamin D deficiency  Continue vitamin D supplementation

## 2019-06-07 LAB — TSH: TSH: 0.742 u[IU]/mL (ref 0.450–4.500)

## 2019-06-07 LAB — SPECIMEN STATUS REPORT

## 2019-06-14 ENCOUNTER — Telehealth: Payer: Self-pay | Admitting: Gastroenterology

## 2019-06-14 NOTE — Telephone Encounter (Signed)
Called patient and left a voicemail to call me back to schedule her colonoscopy on November 17, 2019 since she was wanting a Friday.

## 2019-06-14 NOTE — Telephone Encounter (Signed)
Please call, would like to go ahead and schedule her follow up colonoscopy in October.  Needs 1 year colonoscopy follow up.  Would like to schedule on a Friday.

## 2019-06-15 NOTE — Telephone Encounter (Signed)
Patient was placed on the recall list since she did not answer my call again and did not call me back.

## 2019-06-22 ENCOUNTER — Telehealth (INDEPENDENT_AMBULATORY_CARE_PROVIDER_SITE_OTHER): Payer: Self-pay | Admitting: Gastroenterology

## 2019-06-22 DIAGNOSIS — Z86010 Personal history of colonic polyps: Secondary | ICD-10-CM

## 2019-06-22 NOTE — Progress Notes (Signed)
Gastroenterology Pre-Procedure Review  Request Date: Friday 11/17/19 Requesting Physician: Dr. Bonna Gains  PATIENT REVIEW QUESTIONS: The patient responded to the following health history questions as indicated:    1. Are you having any GI issues? no 2. Do you have a personal history of Polyps? yes (11/11/18 with Dr. Bonna Gains) 3. Do you have a family history of Colon Cancer or Polyps? yes (mother and brother colon polyps) 4. Diabetes Mellitus? yes (type 2) 5. Joint replacements in the past 12 months?no 6. Major health problems in the past 3 months?no 7. Any artificial heart valves, MVP, or defibrillator?no    MEDICATIONS & ALLERGIES:    Patient reports the following regarding taking any anticoagulation/antiplatelet therapy:   Plavix, Coumadin, Eliquis, Xarelto, Lovenox, Pradaxa, Brilinta, or Effient? no Aspirin? no  Patient confirms/reports the following medications:  Current Outpatient Medications  Medication Sig Dispense Refill  . amLODipine-benazepril (LOTREL) 5-20 MG capsule Take 1 capsule by mouth daily. 90 capsule 1  . aspirin 81 MG EC tablet Take 81 mg by mouth daily.      . blood glucose meter kit and supplies Dispense based on patient and insurance preference. Use up to four times daily as directed. (FOR ICD-10 E10.9, E11.9). 1 each 0  . fluticasone (FLONASE) 50 MCG/ACT nasal spray Place 2 sprays into both nostrils daily. 48 g 0  . hydroquinone 4 % cream APP EXT TO FACE Q NIGHT    . ketoconazole (NIZORAL) 2 % shampoo APP EXT AA 3 TIMES A WK UTD    . levothyroxine (SYNTHROID) 75 MCG tablet TAKE 1 TABLET BY MOUTH DAILY 90 tablet 1  . meloxicam (MOBIC) 7.5 MG tablet Mobic 7.5 mg tablet  Take 1 tablet twice a day by oral route.    . metFORMIN (GLUCOPHAGE-XR) 750 MG 24 hr tablet TAKE 1 TABLET BY MOUTH  DAILY WITH BREAKFAST 90 tablet 3  . ONE TOUCH ULTRA TEST test strip CHECK FASTING BLOOD SUGAR  TWICE WEEKLY 100 each 1  . pimecrolimus (ELIDEL) 1 % cream APP EXT AA BID    .  rosuvastatin (CRESTOR) 40 MG tablet TAKE 1 TABLET BY MOUTH DAILY 90 tablet 1  . Semaglutide, 1 MG/DOSE, (OZEMPIC, 1 MG/DOSE,) 2 MG/1.5ML SOPN Inject 1 mg into the skin once a week. 9 mL 0  . Vitamin D, Ergocalciferol, (DRISDOL) 1.25 MG (50000 UNIT) CAPS capsule Take 1 capsule (50,000 Units total) by mouth 2 (two) times a week. Take 2 every week 24 capsule 1  . clobetasol (TEMOVATE) 0.05 % external solution APP TO SCALP BID    . loratadine (CLARITIN) 10 MG tablet Take 1 tablet (10 mg total) by mouth daily. (Patient not taking: Reported on 06/22/2019) 30 tablet 0   No current facility-administered medications for this visit.    Patient confirms/reports the following allergies:  No Known Allergies  No orders of the defined types were placed in this encounter.   AUTHORIZATION INFORMATION Primary Insurance: 1D#: Group #:  Secondary Insurance: 1D#: Group #:  SCHEDULE INFORMATION: Date: Friday 11/17/19 Time: Location:[facility]

## 2019-06-23 ENCOUNTER — Encounter: Payer: Self-pay | Admitting: Family Medicine

## 2019-06-23 ENCOUNTER — Telehealth (INDEPENDENT_AMBULATORY_CARE_PROVIDER_SITE_OTHER): Payer: Managed Care, Other (non HMO) | Admitting: Family Medicine

## 2019-06-23 VITALS — Ht 67.0 in | Wt 246.0 lb

## 2019-06-23 DIAGNOSIS — J069 Acute upper respiratory infection, unspecified: Secondary | ICD-10-CM

## 2019-06-23 DIAGNOSIS — R0981 Nasal congestion: Secondary | ICD-10-CM | POA: Diagnosis not present

## 2019-06-23 MED ORDER — FLUTICASONE PROPIONATE 50 MCG/ACT NA SUSP: 2.0000 | Freq: Every day | NASAL | 0 refills | Status: DC

## 2019-06-23 MED ORDER — BENZONATATE 100 MG PO CAPS: 100.0000 mg | ORAL_CAPSULE | Freq: Two times a day (BID) | ORAL | 0 refills | Status: DC | PRN

## 2019-06-23 NOTE — Progress Notes (Signed)
Name: Tabitha Shaw   MRN: [id number]    DOB: 03-15-1960   Date:06/23/2019       Progress Note  Subjective  Chief Complaint  Chief Complaint  Patient presents with  . Nasal Congestion    Sunday morning patient woke up sneezing, congested, sinus draining    I connected with  Tabitha Shaw on 06/23/19 at 10:40 AM EDT by telephone and verified that I am speaking with the correct person using two identifiers.  I discussed the limitations, risks, security and privacy concerns of performing an evaluation and management service by telephone and the availability of in person appointments. Staff also discussed with the patient that there may be a patient responsible charge related to this service. Patient Location: at home  Provider Location: [facility]   HPI  URI: she woke up on Sunday with nasal congestion, sneezing, dry cough, initially clear rhinorrhea but that has resolved, now when she blows her nose she has mild yellow drainage. She noticed scratchy throat yesterday but has resolved. She states no fever. She denies sick contacts. She had both COVID-19 vaccine. She has been taking Coricidin HBP and benadryl prn She denies headache but has post-nasal drainage and had  mild nausea this am. She has been out of Flonase    Patient Active Problem List   Diagnosis Date Noted  . Personal history of colonic polyps   . Polyp of colon   . Cervical high risk HPV (human papillomavirus) test positive 09/03/2016  . B12 deficiency 09/04/2015  . Elevated PTHrP level 09/04/2015  . Benign hypertension 09/12/2014  . Acanthosis nigricans 09/09/2014  . Diabetes mellitus with renal manifestation ([place]) 09/09/2014  . Dyslipidemia 09/09/2014  . Abnormal electrocardiogram 09/09/2014  . Enlarged kidney 09/09/2014  . 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[place]) 09/09/2014  . Enlarged liver 09/09/2014  . Calcium blood increased 09/09/2014  . Adult hypothyroidism 09/09/2014  . Microalbuminuria 09/09/2014  . Morbid  obesity due to excess calories ([place]) 09/09/2014  . Spinal stenosis of lumbar region with neurogenic claudication 09/09/2014  . Osteopenia 09/09/2014  . Vitamin D deficiency 09/09/2014    Past Surgical History:  Procedure Laterality Date  . ABDOMINAL HYSTERECTOMY  2005  . blood transfusion yearly    . BREAST REDUCTION SURGERY    . CESAREAN SECTION    . CESAREAN SECTION  1992  . CHOLECYSTECTOMY  2000  . COLONOSCOPY WITH PROPOFOL N/A 11/11/2018   Procedure: COLONOSCOPY WITH PROPOFOL;  Surgeon: Virgel Manifold, MD;  Location: [facility] ENDOSCOPY;  Service: Endoscopy;  Laterality: N/A;  . lapcholecystectomy    . PARATHYROIDECTOMY Right 11/26/2017   right lower , [facility] Maudie [facility] at [facility]  . REDUCTION MAMMAPLASTY Bilateral 1985  . VESICOVAGINAL FISTULA CLOSURE W/ TAH      Family History  Problem Relation Age of Onset  . Colon polyps Mother   . Hemachromatosis Mother   . Hypertension Mother   . Depression Sister   . Hypertension Brother   . Colon polyps Brother   . Cancer Father   . Breast cancer Cousin   . Breast cancer Cousin      Current Outpatient Medications:  .  amLODipine-benazepril (LOTREL) 5-20 MG capsule, Take 1 capsule by mouth daily., Disp: 90 capsule, Rfl: 1 .  aspirin 81 MG EC tablet, Take 81 mg by mouth daily.  , Disp: , Rfl:  .  blood glucose meter kit and supplies, Dispense based on patient and insurance preference. Use up to four times daily as directed. (FOR  ICD-10 E10.9, E11.9)., Disp: 1 each, Rfl: 0 .  clobetasol (TEMOVATE) 0.05 % external solution, APP TO SCALP BID, Disp: , Rfl:  .  fluticasone (FLONASE) 50 MCG/ACT nasal spray, Place 2 sprays into both nostrils daily., Disp: 48 g, Rfl: 0 .  hydroquinone 4 % cream, APP EXT TO FACE Q NIGHT, Disp: , Rfl:  .  ketoconazole (NIZORAL) 2 % shampoo, APP EXT AA 3 TIMES A WK UTD, Disp: , Rfl:  .  levothyroxine (SYNTHROID) 75 MCG tablet, TAKE 1 TABLET BY MOUTH DAILY, Disp: 90 tablet, Rfl: 1 .  loratadine (CLARITIN) 10 MG tablet,  Take 1 tablet (10 mg total) by mouth daily., Disp: 30 tablet, Rfl: 0 .  meloxicam (MOBIC) 7.5 MG tablet, Mobic 7.5 mg tablet  Take 1 tablet twice a day by oral route., Disp: , Rfl:  .  metFORMIN (GLUCOPHAGE-XR) 750 MG 24 hr tablet, TAKE 1 TABLET BY MOUTH  DAILY WITH BREAKFAST, Disp: 90 tablet, Rfl: 3 .  ONE TOUCH ULTRA TEST test strip, CHECK FASTING BLOOD SUGAR  TWICE WEEKLY, Disp: 100 each, Rfl: 1 .  pimecrolimus (ELIDEL) 1 % cream, APP EXT AA BID, Disp: , Rfl:  .  rosuvastatin (CRESTOR) 40 MG tablet, TAKE 1 TABLET BY MOUTH DAILY, Disp: 90 tablet, Rfl: 1 .  Semaglutide, 1 MG/DOSE, (OZEMPIC, 1 MG/DOSE,) 2 MG/1.5ML SOPN, Inject 1 mg into the skin once a week., Disp: 9 mL, Rfl: 0 .  Vitamin D, Ergocalciferol, (DRISDOL) 1.25 MG (50000 UNIT) CAPS capsule, Take 1 capsule (50,000 Units total) by mouth 2 (two) times a week. Take 2 every week, Disp: 24 capsule, Rfl: 1  No Known Allergies  I personally reviewed active problem list, medication list, allergies, family history, social history, health maintenance with the patient/caregiver today.   ROS  Ten systems reviewed and is negative except as mentioned in HPI   Objective  Virtual encounter, vitals not obtained.  Body mass index is 38.53 kg/m.  Physical Exam  Awake, alert and oriented  PHQ2/9: Depression screen [facility] 2/9 06/23/2019 05/15/2019 12/09/2018 09/07/2018 03/21/2018  Decreased Interest 0 0 0 0 0  Down, Depressed, Hopeless 0 0 0 0 0  PHQ - 2 Score 0 0 0 0 0  Altered sleeping 0 1 0 0 0  Tired, decreased energy 0 0 0 1 0  Change in appetite 0 0 0 1 0  Feeling bad or failure about yourself  0 0 0 0 0  Trouble concentrating 0 0 0 0 0  Moving slowly or fidgety/restless 0 0 0 0 0  Suicidal thoughts 0 0 0 0 0  PHQ-9 Score 0 1 0 2 0  Difficult doing work/chores Not difficult at all Not difficult at all - Not difficult at all Not difficult at all   PHQ-2/9 Result is negative.    Fall Risk: Fall Risk  06/23/2019 05/15/2019 12/09/2018  09/07/2018 03/21/2018  Falls in the past year? 0 0 0 0 0  Number falls in past yr: 0 0 0 0 0  Injury with Fall? 0 0 0 0 0     Assessment & Plan  1. Nasal congestion  - fluticasone (FLONASE) 50 MCG/ACT nasal spray; Place 2 sprays into both nostrils daily.  Dispense: 48 g; Refill: 0  2. Upper respiratory tract infection, unspecified type  - benzonatate (TESSALON) 100 MG capsule; Take 1-2 capsules (100-200 mg total) by mouth 2 (two) times daily as needed.  Dispense: 40 capsule; Refill: 0  Advised fluids, rest, continue coricidin hbp, try  nasal saline and resume flonase, call back if worsening of symptoms   I discussed the assessment and treatment plan with the patient. The patient was provided an opportunity to ask questions and all were answered. The patient agreed with the plan and demonstrated an understanding of the instructions.   The patient was advised to call back or seek an in-person evaluation if the symptoms worsen or if the condition fails to improve as anticipated.  I provided 15 minutes of non-face-to-face time during this encounter.  Loistine Chance, MD

## 2019-06-29 ENCOUNTER — Ambulatory Visit: Payer: Managed Care, Other (non HMO) | Admitting: Oncology

## 2019-06-29 ENCOUNTER — Other Ambulatory Visit: Payer: Managed Care, Other (non HMO)

## 2019-07-06 NOTE — Progress Notes (Signed)
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  INTERVAL HISTORY: Patient returns to clinic today for repeat laboratory work, further evaluation, and consideration of additional phlebotomy.  She continues to feel well and remains asymptomatic. She has no neurologic complaints.  She denies any recent fevers or illnesses.  She denies any chest pain, shortness of breath, cough, or hemoptysis.  She denies any weakness or fatigue.  She has a good appetite and denies weight loss. She denies any nausea, vomiting, constipation, or diarrhea. She has no urinary complaints.  Patient feels at her baseline and offers no specific complaints today.  REVIEW OF SYSTEMS:   Review of Systems  Constitutional: Negative.  Negative for fever, malaise/fatigue and weight loss.  Respiratory: Negative.  Negative for cough and shortness of breath.   Cardiovascular: Negative.  Negative for chest pain and leg swelling.  Gastrointestinal: Negative.  Negative for abdominal pain, blood in stool and melena.  Genitourinary: Negative.  Negative for dysuria.  Musculoskeletal: Negative.  Negative for back pain.  Skin: Negative.  Negative for rash.  Neurological: Negative.  Negative for sensory change, focal weakness and weakness.  Psychiatric/Behavioral: Negative.  The patient is not nervous/anxious.     As per HPI. Otherwise, a complete review of systems is negative.  PAST MEDICAL HISTORY: Past Medical History:  Diagnosis Date  . Acquired acanthosis nigricans   . Diabetes type 2, controlled ([place])     if controlled was not specified  . Hemochromatosis   . Hepatomegaly   . HLD (hyperlipidemia)   . HTN (hypertension)   . Hyperlipidemia   . Hypertension   . Hypertrophy of kidney   . Hypothyroidism   . Intertrigo   . Obesity   . Osteopenia   . Postinflammatory hyperpigmentation   . Snoring   . Thyroid disease   . Vitamin D deficiency     PAST SURGICAL HISTORY: Past Surgical History:  Procedure Laterality Date  . ABDOMINAL HYSTERECTOMY  2005  . blood transfusion yearly    . BREAST REDUCTION SURGERY    . CESAREAN SECTION    . CESAREAN SECTION  1992  . CHOLECYSTECTOMY  2000  . COLONOSCOPY WITH PROPOFOL N/A 11/11/2018   Procedure: COLONOSCOPY WITH PROPOFOL;  Surgeon: Virgel Manifold, MD;  Location: [facility] ENDOSCOPY;  Service: Endoscopy;  Laterality: N/A;  . lapcholecystectomy    . PARATHYROIDECTOMY Right 11/26/2017   right lower , Dr[facility] Maudie Mercury at [facility]  . REDUCTION MAMMAPLASTY Bilateral 1985  . VESICOVAGINAL FISTULA CLOSURE W/ TAH      FAMILY HISTORY Family History  Problem Relation Age of Onset  . Colon polyps Mother   . Hemachromatosis Mother   . Hypertension Mother   . Depression Sister   . Hypertension Brother   . Colon polyps Brother   . Cancer Father   . Breast cancer Cousin   . Breast cancer Cousin        ADVANCED DIRECTIVES:    HEALTH MAINTENANCE: Social History   Tobacco Use  . Smoking status: Former Smoker    Packs/day: 0.25    Years: 8.00    Pack years: 2.00    Types: Cigarettes  Start date: 01/19/1982    Quit date: 01/19/1990    Years since quitting: 29.4  . Smokeless tobacco: Never Used  Vaping Use  . Vaping Use: Never used  Substance Use Topics  . Alcohol use: Yes    Alcohol/week: 0.0 standard drinks    Comment: occasionally  . Drug use: No     Colonoscopy:  PAP:  Bone density:  Lipid panel:  No Known Allergies  Current Outpatient Medications  Medication Sig Dispense Refill  . amLODipine-benazepril (LOTREL) 5-20 MG capsule Take 1  capsule by mouth daily. 90 capsule 1  . aspirin 81 MG EC tablet Take 81 mg by mouth daily.      . benzonatate (TESSALON) 100 MG capsule Take 1-2 capsules (100-200 mg total) by mouth 2 (two) times daily as needed. 40 capsule 0  . blood glucose meter kit and supplies Dispense based on patient and insurance preference. Use up to four times daily as directed. (FOR ICD-10 E10.9, E11.9). 1 each 0  . clobetasol (TEMOVATE) 0.05 % external solution APP TO SCALP BID    . fluticasone (FLONASE) 50 MCG/ACT nasal spray Place 2 sprays into both nostrils daily. 48 g 0  . hydroquinone 4 % cream APP EXT TO FACE Q NIGHT    . ketoconazole (NIZORAL) 2 % shampoo APP EXT AA 3 TIMES A WK UTD    . levothyroxine (SYNTHROID) 75 MCG tablet TAKE 1 TABLET BY MOUTH DAILY 90 tablet 1  . loratadine (CLARITIN) 10 MG tablet Take 1 tablet (10 mg total) by mouth daily. 30 tablet 0  . meloxicam (MOBIC) 7.5 MG tablet Mobic 7.5 mg tablet  Take 1 tablet twice a day by oral route.    . metFORMIN (GLUCOPHAGE-XR) 750 MG 24 hr tablet TAKE 1 TABLET BY MOUTH  DAILY WITH BREAKFAST 90 tablet 3  . ONE TOUCH ULTRA TEST test strip CHECK FASTING BLOOD SUGAR  TWICE WEEKLY 100 each 1  . pimecrolimus (ELIDEL) 1 % cream APP EXT AA BID    . rosuvastatin (CRESTOR) 40 MG tablet TAKE 1 TABLET BY MOUTH DAILY 90 tablet 1  . Semaglutide, 1 MG/DOSE, (OZEMPIC, 1 MG/DOSE,) 2 MG/1.5ML SOPN Inject 1 mg into the skin once a week. 9 mL 0  . Vitamin D, Ergocalciferol, (DRISDOL) 1.25 MG (50000 UNIT) CAPS capsule Take 1 capsule (50,000 Units total) by mouth 2 (two) times a week. Take 2 every week 24 capsule 1   No current facility-administered medications for this visit.    OBJECTIVE: Vitals:   07/11/19 1314  BP: 120/84  Pulse: 80  Resp: 19  Temp: (!) 97 F (36.1 C)  SpO2: 97%     Body mass index is 39 kg/m.    ECOG FS:0 - Asymptomatic  General: Well-developed, well-nourished, no acute distress. Eyes: Pink conjunctiva, anicteric sclera. HEENT:  Normocephalic, moist mucous membranes. Lungs: No audible wheezing or coughing. Heart: Regular rate and rhythm. Abdomen: Soft, nontender, no obvious distention. Musculoskeletal: No edema, cyanosis, or clubbing. Neuro: Alert, answering all questions appropriately. Cranial nerves grossly intact. Skin: No rashes or petechiae noted. Psych: Normal affect.   LAB RESULTS:  Lab Results  Component Value Date   NA 141 05/12/2019   K 4.5 05/12/2019   CL 100 05/12/2019   CO2 27 05/12/2019   GLUCOSE 123 (H) 05/12/2019   BUN 11 05/12/2019   CREATININE 0.81 05/12/2019   CALCIUM 10.1 05/12/2019   PROT 7.6 05/12/2019   ALBUMIN 4.8 05/12/2019   AST 25 05/12/2019   ALT 24  05/12/2019   ALKPHOS 72 05/12/2019   BILITOT 0.8 05/12/2019   GFRNONAA 81 05/12/2019   GFRAA 94 05/12/2019    Lab Results  Component Value Date   WBC 7.5 08/26/2017   NEUTROABS 3.7 08/26/2017   HGB 14.9 08/26/2017   HCT 42.4 08/26/2017   MCV 102 (H) 08/26/2017   PLT 253 08/26/2017     STUDIES: No results found.  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  PLAN:    1.  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  All of her other laboratory work continues to be within normal limits.  Goal ferritin will be between 50 and 100.  Although patient is at goal, she wishes to proceed with 500 mL phlebotomy today.  Return to clinic in 6 months with repeat laboratory work, further evaluation, and continuation of treatment if needed. Patient continues to get all of her laboratory work at [facility].   2.  Parathyroid surgery: Continue treatment and follow-up at [facility].  I spent a total of 20 minutes reviewing chart data, face-to-face evaluation with the patient, counseling and coordination of care as detailed above.   Patient expressed understanding and was in agreement with this plan. She also understands that She can call clinic at any  time with any questions, concerns, or complaints.    Lloyd Huger, MD   07/12/2019 7:01 AM

## 2019-07-10 ENCOUNTER — Encounter: Payer: Self-pay | Admitting: Oncology

## 2019-07-10 NOTE — Progress Notes (Signed)
Patient prescreened for appointment. Patient has no concerns or questions.  

## 2019-07-11 ENCOUNTER — Inpatient Hospital Stay: Payer: Managed Care, Other (non HMO) | Attending: Oncology | Admitting: Oncology

## 2019-07-11 ENCOUNTER — Other Ambulatory Visit: Payer: Self-pay

## 2019-07-11 ENCOUNTER — Inpatient Hospital Stay: Payer: Managed Care, Other (non HMO)

## 2019-07-11 DIAGNOSIS — Z803 Family history of malignant neoplasm of breast: Secondary | ICD-10-CM | POA: Insufficient documentation

## 2019-07-11 DIAGNOSIS — Z87891 Personal history of nicotine dependence: Secondary | ICD-10-CM | POA: Insufficient documentation

## 2019-07-11 DIAGNOSIS — Z809 Family history of malignant neoplasm, unspecified: Secondary | ICD-10-CM | POA: Diagnosis not present

## 2019-07-11 DIAGNOSIS — Z86010 Personal history of colonic polyps: Secondary | ICD-10-CM | POA: Diagnosis not present

## 2019-07-11 DIAGNOSIS — Z90710 Acquired absence of both cervix and uterus: Secondary | ICD-10-CM | POA: Diagnosis not present

## 2019-07-17 LAB — HM DIABETES EYE EXAM

## 2019-08-04 ENCOUNTER — Other Ambulatory Visit: Payer: Self-pay

## 2019-08-04 DIAGNOSIS — Z20822 Contact with and (suspected) exposure to covid-19: Secondary | ICD-10-CM

## 2019-08-06 ENCOUNTER — Other Ambulatory Visit: Payer: Self-pay | Admitting: Family Medicine

## 2019-08-06 DIAGNOSIS — E1129 Type 2 diabetes mellitus with other diabetic kidney complication: Secondary | ICD-10-CM

## 2019-08-06 NOTE — Telephone Encounter (Signed)
Requested Prescriptions  Pending Prescriptions Disp Refills   OZEMPIC, 1 MG/DOSE, 2 MG/1.5ML SOPN [Pharmacy Med Name: OZEMPIC 1MG  PER DOSE (2X2MG  PEN)] 9 mL 0    Sig: INJECT 1 MG INTO THE SKIN ONCE A WEEK     Endocrinology:  Diabetes - GLP-1 Receptor Agonists Passed - 08/06/2019 11:02 AM      Passed - HBA1C is between 0 and 7.9 and within 180 days    Hemoglobin A1C  Date Value Ref Range Status  05/12/2019 6.0  Final   Hgb A1c MFr Bld  Date Value Ref Range Status  05/12/2019 6.0 (H) 4.8 - 5.6 % Final    Comment:             Prediabetes: 5.7 - 6.4          Diabetes: >6.4          Glycemic control for adults with diabetes: <7.0          Passed - Valid encounter within last 6 months    Recent Outpatient Visits          1 month ago Upper respiratory tract infection, unspecified type   [facility] [place], Drue Stager, MD   2 months ago Type 2 diabetes mellitus with microalbuminuria, with long-term current use of insulin [facility])   [facility] [place], Drue Stager, MD   8 months ago Type 2 diabetes mellitus with microalbuminuria, with long-term current use of insulin [facility])   [facility] [place], Drue Stager, MD   11 months ago Type 2 diabetes mellitus with microalbuminuria, with long-term current use of insulin [facility])   [facility] Steele Sizer, MD   1 year ago Viral upper respiratory tract infection   [place], NP      Future Appointments            In 1 month Ancil Boozer, Drue Stager, MD [facility], [facility]

## 2019-08-23 ENCOUNTER — Other Ambulatory Visit: Payer: Self-pay | Admitting: Family Medicine

## 2019-08-24 ENCOUNTER — Encounter: Payer: Self-pay | Admitting: Family Medicine

## 2019-08-24 ENCOUNTER — Other Ambulatory Visit: Payer: Self-pay | Admitting: Family Medicine

## 2019-08-24 DIAGNOSIS — Z1231 Encounter for screening mammogram for malignant neoplasm of breast: Secondary | ICD-10-CM

## 2019-08-24 LAB — SAR COV2 SEROLOGY (COVID19)AB(IGG),IA: DiaSorin SARS-CoV-2 Ab, IgG: POSITIVE

## 2019-08-29 ENCOUNTER — Other Ambulatory Visit: Payer: Self-pay

## 2019-08-29 ENCOUNTER — Ambulatory Visit
Admission: RE | Admit: 2019-08-29 | Discharge: 2019-08-29 | Disposition: A | Discharge status: Home / Self Care | Payer: Managed Care, Other (non HMO) | Source: Ambulatory Visit | Attending: Family Medicine | Admitting: Family Medicine

## 2019-08-29 DIAGNOSIS — Z1231 Encounter for screening mammogram for malignant neoplasm of breast: Secondary | ICD-10-CM | POA: Insufficient documentation

## 2019-08-29 LAB — SPECIMEN STATUS REPORT

## 2019-08-29 LAB — SARS-COV-2 SEMI-QUANTITATIVE TOTAL ANTIBODY, SPIKE
SARS-CoV-2 Semi-Quant Total Ab: 153.8 U/mL (ref <0.8)
SARS-CoV-2 Spike Ab Interp: POSITIVE

## 2019-08-29 IMAGING — MG DIGITAL SCREENING BILAT W/ TOMO W/ CAD
8 of 14 series · 8 of 40 positions shown · non-contrast
Comparison: Previous exam(s).

ACR Breast Density Category a: The breast tissue is almost entirely
fatty.

CLINICAL DATA: Screening.

EXAM:
DIGITAL SCREENING BILATERAL MAMMOGRAM WITH TOMO AND CAD

[R MLO synth-2D (1 of 2)]
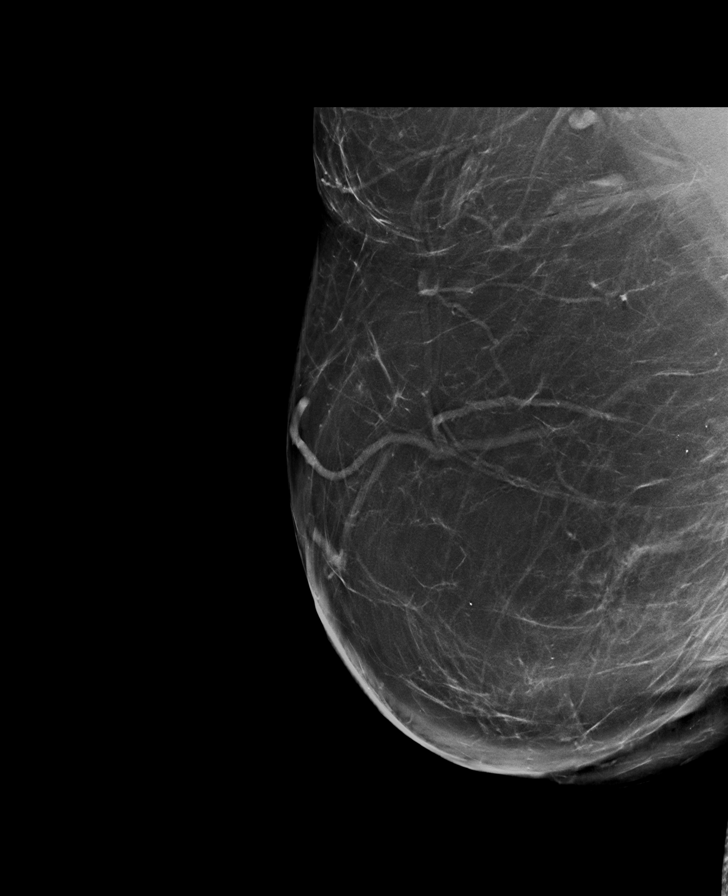

[L MLO synth-2D (1 of 2)]
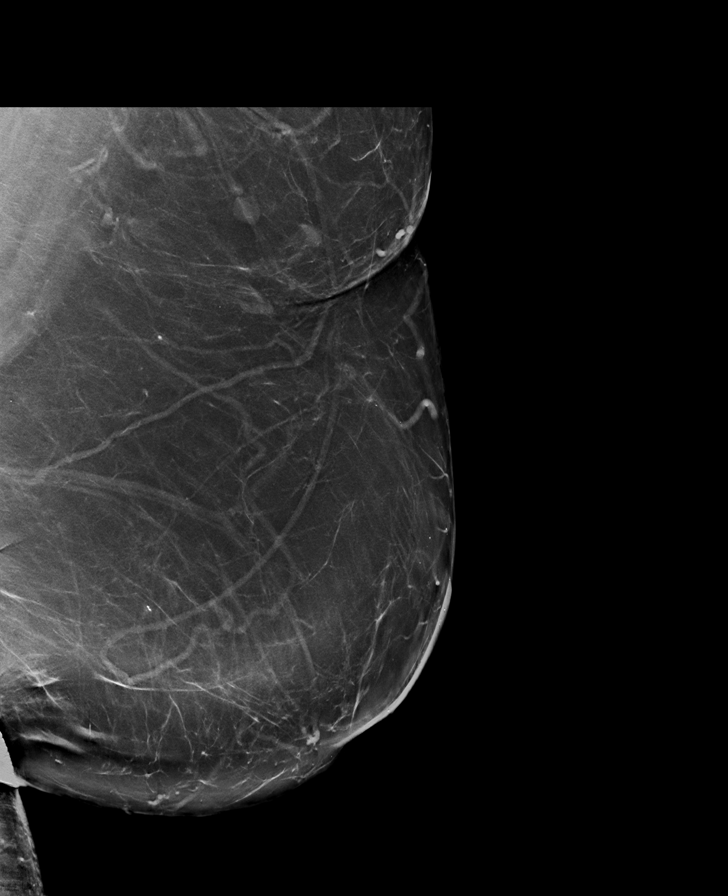

[L CC synth-2D]
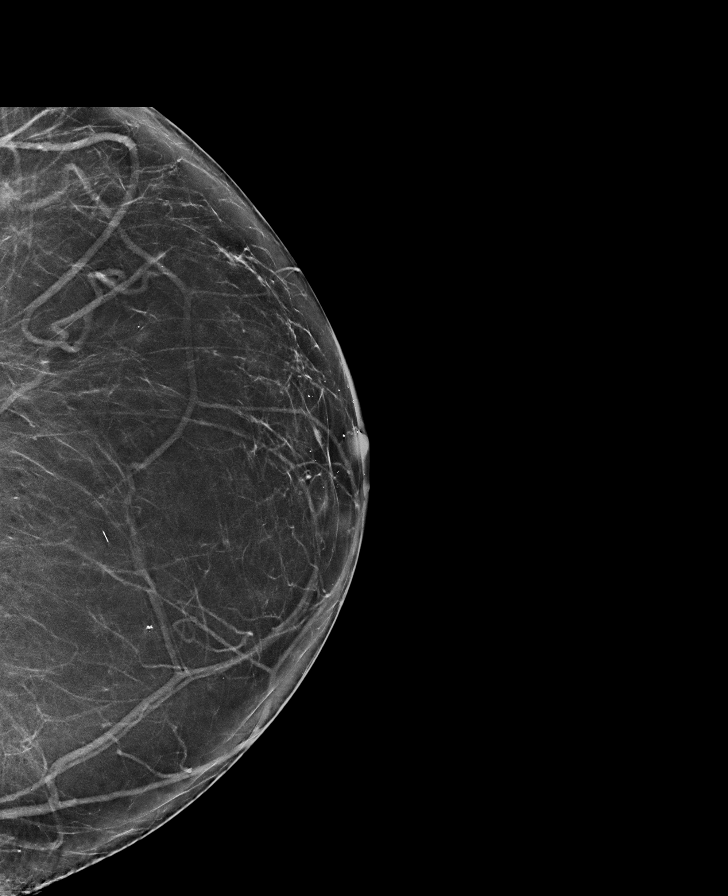

[R MLO synth-2D (2 of 2)]
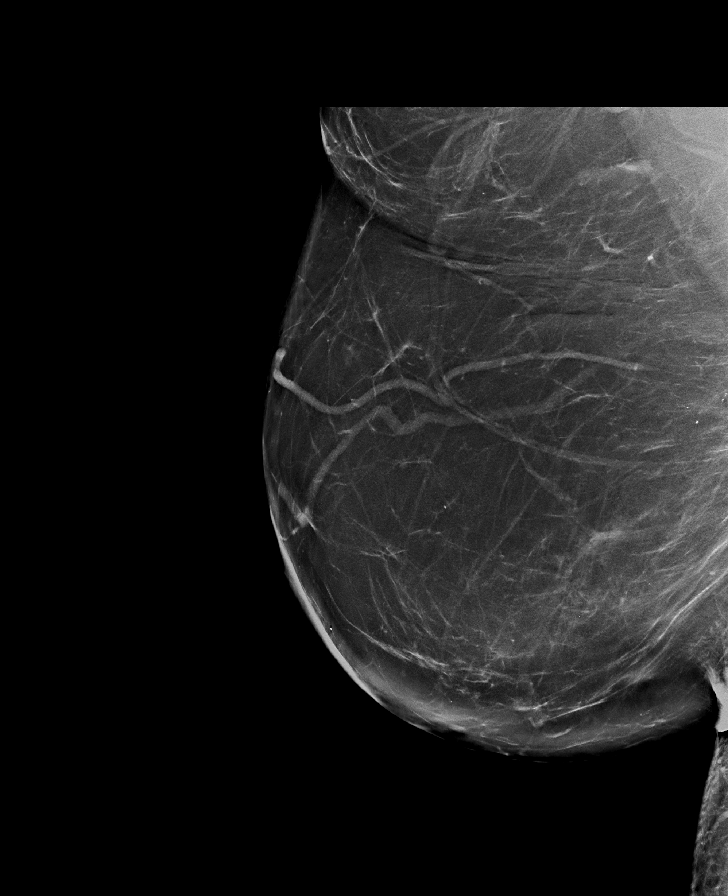

[L MLO synth-2D (2 of 2)]
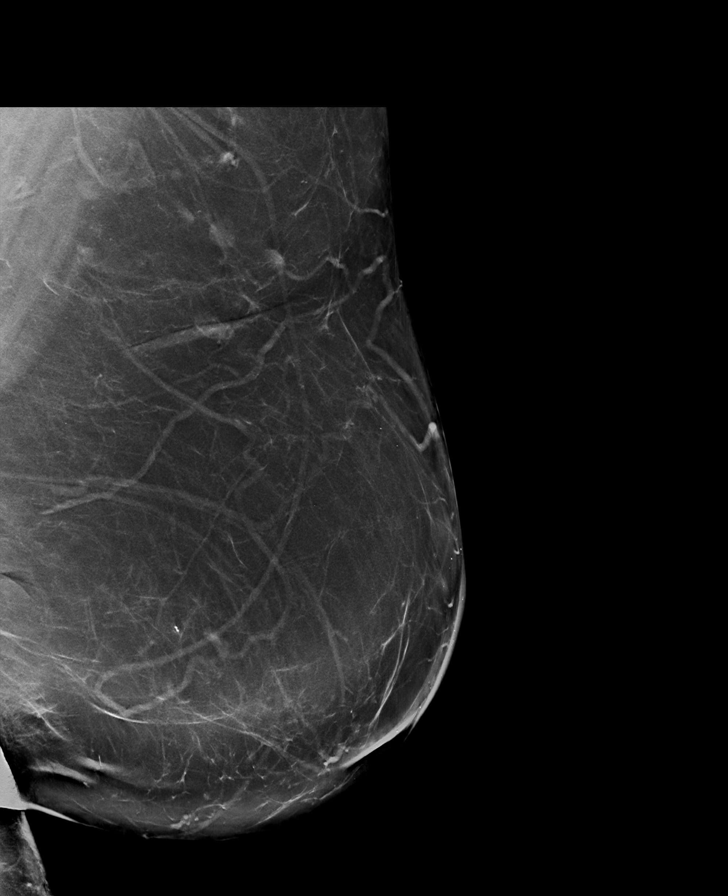

[R CC synth-2D]
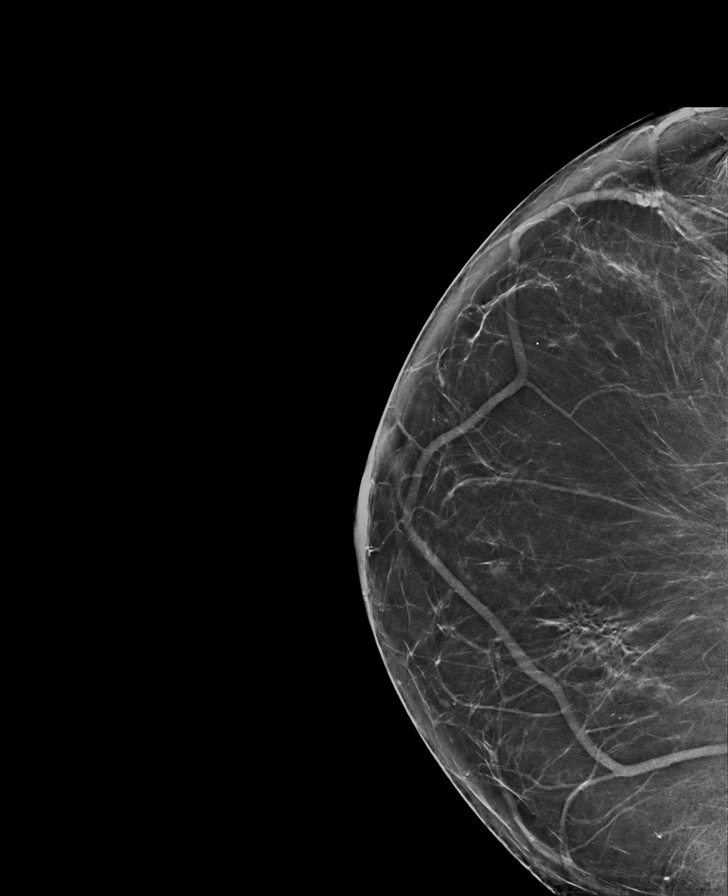

[R CV synth-2D]
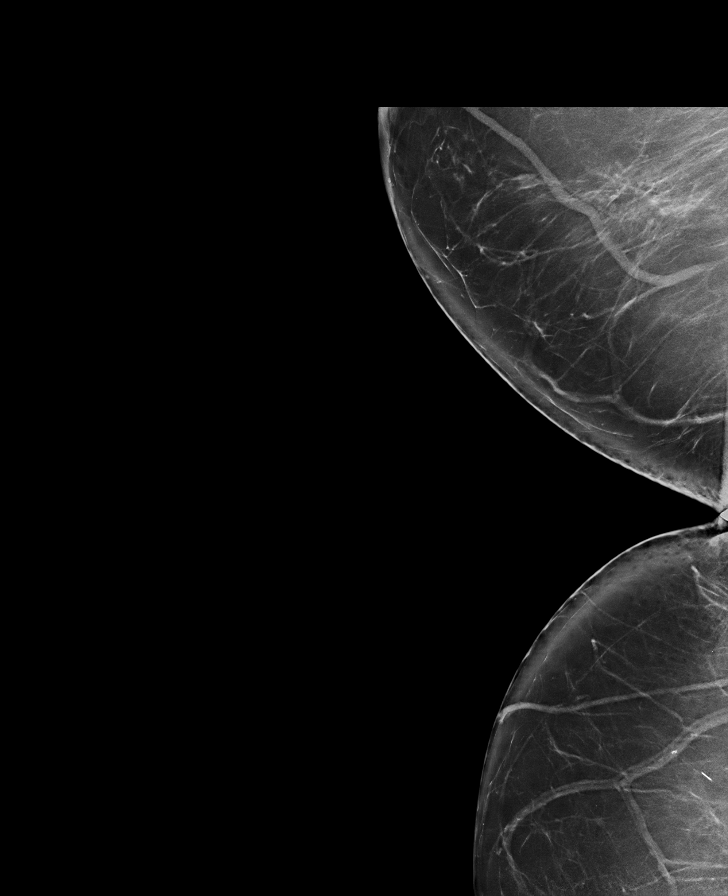

[R CC tomo · tomo slice 37/73.0]
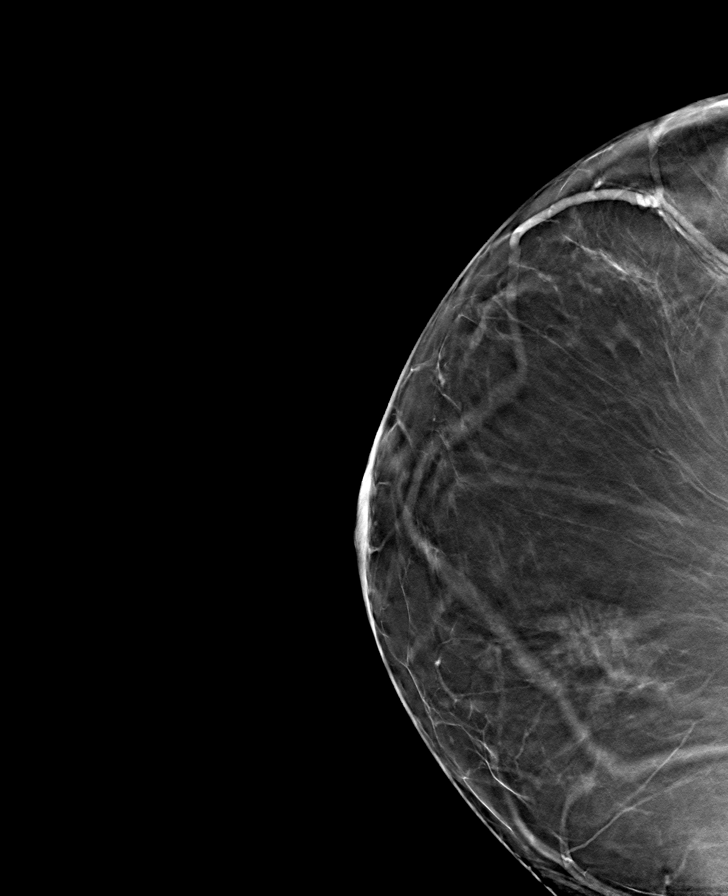

[8 of 40 positions shown; findings below may reference images not displayed]

FINDINGS: There are no findings suspicious for malignancy. Images were
processed with CAD.
IMPRESSION: No mammographic evidence of malignancy. A result letter of this
screening mammogram will be mailed directly to the patient.

RECOMMENDATION:
Screening mammogram in one year. (Code:[TA])

BI-RADS CATEGORY  1: Negative.

## 2019-08-31 ENCOUNTER — Other Ambulatory Visit: Payer: Self-pay | Admitting: Family Medicine

## 2019-08-31 DIAGNOSIS — E039 Hypothyroidism, unspecified: Secondary | ICD-10-CM

## 2019-09-18 ENCOUNTER — Other Ambulatory Visit: Payer: Self-pay | Admitting: Family Medicine

## 2019-09-18 DIAGNOSIS — R0981 Nasal congestion: Secondary | ICD-10-CM

## 2019-09-19 ENCOUNTER — Ambulatory Visit (INDEPENDENT_AMBULATORY_CARE_PROVIDER_SITE_OTHER): Payer: Managed Care, Other (non HMO) | Admitting: Family Medicine

## 2019-09-19 ENCOUNTER — Other Ambulatory Visit (HOSPITAL_COMMUNITY)
Admission: RE | Admit: 2019-09-19 | Discharge: 2019-09-19 | Disposition: A | Discharge status: Home / Self Care | Payer: Managed Care, Other (non HMO) | Source: Ambulatory Visit | Attending: Family Medicine | Admitting: Family Medicine

## 2019-09-19 ENCOUNTER — Other Ambulatory Visit: Payer: Self-pay

## 2019-09-19 ENCOUNTER — Encounter: Payer: Self-pay | Admitting: Family Medicine

## 2019-09-19 VITALS — BP 130/80 | HR 94 | Temp 98.3°F | Resp 16 | Ht 66.25 in | Wt 248.6 lb

## 2019-09-19 DIAGNOSIS — Z0000 Encounter for general adult medical examination without abnormal findings: Secondary | ICD-10-CM

## 2019-09-19 DIAGNOSIS — Z124 Encounter for screening for malignant neoplasm of cervix: Secondary | ICD-10-CM | POA: Insufficient documentation

## 2019-09-19 DIAGNOSIS — Z23 Encounter for immunization: Secondary | ICD-10-CM | POA: Diagnosis not present

## 2019-09-19 NOTE — Progress Notes (Signed)
Name: Tabitha Shaw   MRN: [id number]    DOB: 08/09/1960   Date:09/19/2019       Progress Note  Subjective  Chief Complaint  Chief Complaint  Patient presents with  . Annual Exam    HPI  Patient presents for annual CPE.  Diet: not enough vegetables, she eats fruit daily but only one serving. , needs to increase to 5-6 servings per day, she is not having enough calcium in her diet  Exercise: walking 3 days a week, advised to increase to 5 days a week, total 150 minutes per week.     Office Visit from 09/19/2019 in [facility]  AUDIT-C Score 0     Depression: Phq 9 is  negative Depression screen [facility] 2/9 09/19/2019 06/23/2019 05/15/2019 12/09/2018 09/07/2018  Decreased Interest 0 0 0 0 0  Down, Depressed, Hopeless 0 0 0 0 0  PHQ - 2 Score 0 0 0 0 0  Altered sleeping 0 0 1 0 0  Tired, decreased energy 0 0 0 0 1  Change in appetite 0 0 0 0 1  Feeling bad or failure about yourself  0 0 0 0 0  Trouble concentrating 0 0 0 0 0  Moving slowly or fidgety/restless 0 0 0 0 0  Suicidal thoughts 0 0 0 0 0  PHQ-9 Score 0 0 1 0 2  Difficult doing work/chores - Not difficult at all Not difficult at all - Not difficult at all  Some recent data might be hidden   Hypertension: BP Readings from Last 3 Encounters:  09/19/19 130/80  07/11/19 120/84  05/15/19 112/74   Obesity: Wt Readings from Last 3 Encounters:  09/19/19 248 lb 9.6 oz (112.8 kg)  07/11/19 249 lb (112.9 kg)  06/23/19 246 lb (111.6 kg)   BMI Readings from Last 3 Encounters:  09/19/19 39.82 kg/m  07/11/19 39.00 kg/m  06/23/19 38.53 kg/m     Vaccines:   Shingrix: 5-64 yo and ask insurance if covered when patient above 53 yo  Pneumonia: 12/31/2009  Flu: Done today   Hep C Screening: 09/05/2018 STD testing and prevention (HIV/chl/gon/syphilis): N/A Intimate partner violence: None Sexual History :no pain during intercourse , one partner - husband  Menstrual History/LMP/Abnormal Bleeding: s/p  hysterectomy, supra-cervical for DUB Incontinence Symptoms: no symptoms   Breast cancer:  - Last Mammogram: 08/29/2019 - BRCA gene screening: N/A  Osteoporosis: Discussed high calcium and vitamin D supplementation, weight bearing exercises  Cervical cancer screening: Today  Skin cancer: Discussed monitoring for atypical lesions  Colorectal cancer: 11/11/2018 Lung cancer:   Low Dose CT Chest recommended if Age 15-80 years, 30 pack-year currently smoking OR have quit w/in 15years. Patient does not qualify.   ECG:up to date   Advanced Care Planning: A voluntary discussion about advance care planning including the explanation and discussion of advance directives.  Discussed health care proxy and Living will, and the patient was able to identify a health care proxy as husband and daughter  .  Patient does  have a living will at present time.    Lipids: Lab Results  Component Value Date   CHOL 139 05/12/2019   CHOL 139 05/12/2019   CHOL 253 (H) 09/05/2018   Lab Results  Component Value Date   HDL 42 05/12/2019   HDL 42 05/12/2019   HDL 42 09/05/2018   Lab Results  Component Value Date   LDLCALC 76 05/12/2019   LDLCALC 21 05/12/2019   LDLCALC 162 (H)  09/05/2018   Lab Results  Component Value Date   TRIG 115 05/12/2019   TRIG 115 05/12/2019   TRIG 246 (H) 09/05/2018   Lab Results  Component Value Date   CHOLHDL 3.3 05/12/2019   CHOLHDL 6.0 (H) 09/05/2018   CHOLHDL 4.6 (H) 08/26/2017   No results found for: LDLDIRECT  Glucose: Glucose  Date Value Ref Range Status  05/12/2019 123 (H) 65 - 99 mg/dL Final  09/05/2018 174 (H) 65 - 99 mg/dL Final  08/26/2017 116 (H) 65 - 99 mg/dL Final   Glucose-Capillary  Date Value Ref Range Status  11/11/2018 122 (H) 70 - 99 mg/dL Final    Patient Active Problem List   Diagnosis Date Noted  . Personal history of colonic polyps   . Polyp of colon   . Cervical high risk HPV (human papillomavirus) test positive 09/03/2016  .  B12 deficiency 09/04/2015  . Elevated PTHrP level 09/04/2015  . Benign hypertension 09/12/2014  . Acanthosis nigricans 09/09/2014  . Diabetes mellitus with renal manifestation ([place]) 09/09/2014  . Dyslipidemia 09/09/2014  . Abnormal electrocardiogram 09/09/2014  . Enlarged kidney 09/09/2014  . 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[place]) 09/09/2014  . Enlarged liver 09/09/2014  . Calcium blood increased 09/09/2014  . Adult hypothyroidism 09/09/2014  . Microalbuminuria 09/09/2014  . Morbid obesity due to excess calories ([place]) 09/09/2014  . Spinal stenosis of lumbar region with neurogenic claudication 09/09/2014  . Osteopenia 09/09/2014  . Vitamin D deficiency 09/09/2014    Past Surgical History:  Procedure Laterality Date  . ABDOMINAL HYSTERECTOMY  2005  . blood transfusion yearly    . BREAST REDUCTION SURGERY    . CESAREAN SECTION    . CESAREAN SECTION  1992  . CHOLECYSTECTOMY  2000  . COLONOSCOPY WITH PROPOFOL N/A 11/11/2018   Procedure: COLONOSCOPY WITH PROPOFOL;  Surgeon: Virgel Manifold, MD;  Location: [facility] ENDOSCOPY;  Service: Endoscopy;  Laterality: N/A;  . lapcholecystectomy    . PARATHYROIDECTOMY Right 11/26/2017   right lower , Dr. Maudie Mercury at [facility]  . REDUCTION MAMMAPLASTY Bilateral 1985  . VESICOVAGINAL FISTULA CLOSURE W/ TAH      Family History  Problem Relation Age of Onset  . Colon polyps Mother   . Hemachromatosis Mother   . Hypertension Mother   . Depression Sister   . Hypertension Brother   . Colon polyps Brother   . Cancer Father   . Breast cancer Cousin   . Breast cancer Cousin     Social History   Socioeconomic History  . Marital status: Married    Spouse name: [name]   . Number of children: 1  . Years of education: Not on file  . Highest education level: Some college, no degree  Occupational History  . Occupation: customer services     Comment: [employer]   Tobacco Use  . Smoking status: Former Smoker    Packs/day: 0.25    Years: 8.00    Pack years: 2.00     Types: Cigarettes    Start date: 01/19/1982    Quit date: 01/19/1990    Years since quitting: 29.6  . Smokeless tobacco: Never Used  Vaping Use  . Vaping Use: Never used  Substance and Sexual Activity  . Alcohol use: Yes    Alcohol/week: 0.0 standard drinks    Comment: occasionally  . Drug use: No  . Sexual activity: Yes    Partners: Male    Birth control/protection: Surgical  Other Topics Concern  . Not on file  Social History Narrative  Married; works  full time   Her daughter, [name] is a travel Technical brewer Strain: [place]   . Difficulty of Paying Living Expenses: Not hard at all  Food Insecurity: No Food Insecurity  . Worried About Charity fundraiser in the Last Year: Never true  . Ran Out of Food in the Last Year: Never true  Transportation Needs: No Transportation Needs  . Lack of Transportation (Medical): No  . Lack of Transportation (Non-Medical): No  Physical Activity: Insufficiently Active  . Days of Exercise per Week: 3 days  . Minutes of Exercise per Session: 30 min  Stress: No Stress Concern Present  . Feeling of Stress : Only a little  Social Connections: Socially Integrated  . Frequency of Communication with Friends and Family: Twice a week  . Frequency of Social Gatherings with Friends and Family: Once a week  . Attends Religious Services: 1 to 4 times per year  . Active Member of Clubs or Organizations: Yes  . Attends Archivist Meetings: 1 to 4 times per year  . Marital Status: Married  Human resources officer Violence: Not At Risk  . Fear of Current or Ex-Partner: No  . Emotionally Abused: No  . Physically Abused: No  . Sexually Abused: No     Current Outpatient Medications:  .  amLODipine-benazepril (LOTREL) 5-20 MG capsule, Take 1 capsule by mouth daily., Disp: 90 capsule, Rfl: 1 .  aspirin 81 MG EC tablet, Take 81 mg by mouth daily.  , Disp: , Rfl:  .  blood glucose meter kit and supplies,  Dispense based on patient and insurance preference. Use up to four times daily as directed. (FOR ICD-10 E10.9, E11.9)., Disp: 1 each, Rfl: 0 .  clobetasol (TEMOVATE) 0.05 % external solution, APP TO SCALP BID, Disp: , Rfl:  .  fluticasone (FLONASE) 50 MCG/ACT nasal spray, SHAKE LIQUID AND USE 2 SPRAYS IN EACH NOSTRIL DAILY, Disp: 48 g, Rfl: 0 .  hydroquinone 4 % cream, APP EXT TO FACE Q NIGHT, Disp: , Rfl:  .  levothyroxine (SYNTHROID) 75 MCG tablet, TAKE 1 TABLET BY MOUTH DAILY, Disp: 90 tablet, Rfl: 2 .  metFORMIN (GLUCOPHAGE-XR) 750 MG 24 hr tablet, TAKE 1 TABLET BY MOUTH  DAILY WITH BREAKFAST, Disp: 90 tablet, Rfl: 3 .  ONE TOUCH ULTRA TEST test strip, CHECK FASTING BLOOD SUGAR  TWICE WEEKLY, Disp: 100 each, Rfl: 1 .  pimecrolimus (ELIDEL) 1 % cream, APP EXT AA BID, Disp: , Rfl:  .  rosuvastatin (CRESTOR) 40 MG tablet, TAKE 1 TABLET BY MOUTH DAILY, Disp: 90 tablet, Rfl: 2 .  Vitamin D, Ergocalciferol, (DRISDOL) 1.25 MG (50000 UNIT) CAPS capsule, Take 1 capsule (50,000 Units total) by mouth 2 (two) times a week. Take 2 every week, Disp: 24 capsule, Rfl: 1 .  ketoconazole (NIZORAL) 2 % shampoo, APP EXT AA 3 TIMES A WK UTD (Patient not taking: Reported on 09/19/2019), Disp: , Rfl:  .  loratadine (CLARITIN) 10 MG tablet, Take 1 tablet (10 mg total) by mouth daily. (Patient not taking: Reported on 09/19/2019), Disp: 30 tablet, Rfl: 0 .  meloxicam (MOBIC) 7.5 MG tablet, Mobic 7.5 mg tablet  Take 1 tablet twice a day by oral route. (Patient not taking: Reported on 09/19/2019), Disp: , Rfl:  .  OZEMPIC, 1 MG/DOSE, 4 MG/3ML SOPN, Inject 1 mg into the skin once a week., Disp: , Rfl:   No Known Allergies   ROS  Constitutional: Negative for fever or weight change.  Respiratory: Negative for cough and shortness of breath.   Cardiovascular: Negative for chest pain or palpitations.  Gastrointestinal: Negative for abdominal pain, no bowel changes.  Musculoskeletal: Negative for gait problem or joint  swelling.  Skin: Negative for rash.  Neurological: Negative for dizziness or headache.  No other specific complaints in a complete review of systems (except as listed in HPI above).  Objective  Vitals:   09/19/19 0808  BP: 130/80  Pulse: 94  Resp: 16  Temp: 98.3 F (36.8 C)  TempSrc: Oral  SpO2: 98%  Weight: 248 lb 9.6 oz (112.8 kg)  Height: 5' 6.25" (1.683 m)    Body mass index is 39.82 kg/m.  Physical Exam  Constitutional: Patient appears well-developed and well-nourished. Obesity No distress.  HENT: Head: Normocephalic and atraumatic. Ears: B TMs ok, no erythema or effusion; Nose: Not done   Mouth/Throat: not done  Eyes: Conjunctivae and EOM are normal. Pupils are equal, round, and reactive to light. No scleral icterus.  Neck: Normal range of motion. Neck supple. No JVD present. No thyromegaly present.  Cardiovascular: Normal rate, regular rhythm and normal heart sounds.  No murmur heard. No BLE edema. Pulmonary/Chest: Effort normal and breath sounds normal. No respiratory distress. Abdominal: Soft. Bowel sounds are normal, no distension. There is no tenderness. no masses Breast: no lumps or masses, no nipple discharge or rashes FEMALE GENITALIA:  External genitalia normal External urethra normal Vaginal vault normal without discharge or lesions Cervix normal without discharge or lesions Bimanual exam normal without masses RECTAL: not done  Musculoskeletal: Normal range of motion, no joint effusions. No gross deformities Neurological: he is alert and oriented to person, place, and time. No cranial nerve deficit. Coordination, balance, strength, speech and gait are normal.  Skin: Skin is warm and dry. No rash noted. No erythema.  Psychiatric: Patient has a normal mood and affect. behavior is normal. Judgment and thought content normal.    Fall Risk: Fall Risk  09/19/2019 06/23/2019 05/15/2019 12/09/2018 09/07/2018  Falls in the past year? 0 0 0 0 0  Number falls in past  yr: 0 0 0 0 0  Injury with Fall? 0 0 0 0 0     Functional Status Survey: Is the patient deaf or have difficulty hearing?: No Does the patient have difficulty seeing, even when wearing glasses/contacts?: No Does the patient have difficulty concentrating, remembering, or making decisions?: No Does the patient have difficulty walking or climbing stairs?: No Does the patient have difficulty dressing or bathing?: No Does the patient have difficulty doing errands alone such as visiting a doctor's office or shopping?: No   Assessment & Plan  1. Well adult exam   2. Need for immunization against influenza  - Flu Vaccine QUAD 36+ mos IM  3. Cervical cancer screening  - Cytology - PAP  4. Need for Tdap vaccination  - Tdap vaccine greater than or equal to 7yo IM  -USPSTF grade A and B recommendations reviewed with patient; age-appropriate recommendations, preventive care, screening tests, etc discussed and encouraged; healthy living encouraged; see AVS for patient education given to patient -Discussed importance of 150 minutes of physical activity weekly, eat two servings of fish weekly, eat one serving of tree nuts ( cashews, pistachios, pecans, almonds.Marland Kitchen) every other day, eat 6 servings of fruit/vegetables daily and drink plenty of water and avoid sweet beverages.

## 2019-09-19 NOTE — Patient Instructions (Addendum)
Check with insurance if they will pay for shingrix    Preventive Care 37-57 Years Old, Female Preventive care refers to visits with your health care provider and lifestyle choices that can promote health and wellness. This includes:  A yearly physical exam. This may also be called an annual well check.  Regular dental visits and eye exams.  Immunizations.  Screening for certain conditions.  Healthy lifestyle choices, such as eating a healthy diet, getting regular exercise, not using drugs or products that contain nicotine and tobacco, and limiting alcohol use. What can I expect for my preventive care visit? Physical exam Your health care provider will check your:  Height and weight. This may be used to calculate body mass index (BMI), which tells if you are at a healthy weight.  Heart rate and blood pressure.  Skin for abnormal spots. Counseling Your health care provider may ask you questions about your:  Alcohol, tobacco, and drug use.  Emotional well-being.  Home and relationship well-being.  Sexual activity.  Eating habits.  Work and work Statistician.  Method of birth control.  Menstrual cycle.  Pregnancy history. What immunizations do I need?  Influenza (flu) vaccine  This is recommended every year. Tetanus, diphtheria, and pertussis (Tdap) vaccine  You may need a Td booster every 10 years. Varicella (chickenpox) vaccine  You may need this if you have not been vaccinated. Zoster (shingles) vaccine  You may need this after age 57. Measles, mumps, and rubella (MMR) vaccine  You may need at least one dose of MMR if you were born in 1955 or later. You may also need a second dose. Pneumococcal conjugate (PCV13) vaccine  You may need this if you have certain conditions and were not previously vaccinated. Pneumococcal polysaccharide (PPSV23) vaccine  You may need one or two doses if you smoke cigarettes or if you have certain conditions. Meningococcal  conjugate (MenACWY) vaccine  You may need this if you have certain conditions. Hepatitis A vaccine  You may need this if you have certain conditions or if you travel or work in places where you may be exposed to hepatitis A. Hepatitis B vaccine  You may need this if you have certain conditions or if you travel or work in places where you may be exposed to hepatitis B. Haemophilus influenzae type b (Hib) vaccine  You may need this if you have certain conditions. Human papillomavirus (HPV) vaccine  If recommended by your health care provider, you may need three doses over 6 months. You may receive vaccines as individual doses or as more than one vaccine together in one shot (combination vaccines). Talk with your health care provider about the risks and benefits of combination vaccines. What tests do I need? Blood tests  Lipid and cholesterol levels. These may be checked every 5 years, or more frequently if you are over 57 years old.  Hepatitis C test.  Hepatitis B test. Screening  Lung cancer screening. You may have this screening every year starting at age 57 if you have a 30-pack-year history of smoking and currently smoke or have quit within the past 15 years.  Colorectal cancer screening. All adults should have this screening starting at age 10 and continuing until age 57. Your health care provider may recommend screening at age 57 if you are at increased risk. You will have tests every 1-10 years, depending on your results and the type of screening test.  Diabetes screening. This is done by checking your blood sugar (glucose) after  you have not eaten for a while (fasting). You may have this done every 1-3 years.  Mammogram. This may be done every 1-2 years. Talk with your health care provider about when you should start having regular mammograms. This may depend on whether you have a family history of breast cancer.  BRCA-related cancer screening. This may be done if you have a  family history of breast, ovarian, tubal, or peritoneal cancers.  Pelvic exam and Pap test. This may be done every 3 years starting at age 57. Starting at age 57, this may be done every 5 years if you have a Pap test in combination with an HPV test. Other tests  Sexually transmitted disease (STD) testing.  Bone density scan. This is done to screen for osteoporosis. You may have this scan if you are at high risk for osteoporosis. Follow these instructions at home: Eating and drinking  Eat a diet that includes fresh fruits and vegetables, whole grains, lean protein, and low-fat dairy.  Take vitamin and mineral supplements as recommended by your health care provider.  Do not drink alcohol if: ? Your health care provider tells you not to drink. ? You are pregnant, may be pregnant, or are planning to become pregnant.  If you drink alcohol: ? Limit how much you have to 0-1 drink a day. ? Be aware of how much alcohol is in your drink. In the [place] one drink equals one 12 oz bottle of beer (355 mL), one 5 oz glass of wine (148 mL), or one 1 oz glass of hard liquor (44 mL). Lifestyle  Take daily care of your teeth and gums.  Stay active. Exercise for at least 30 minutes on 5 or more days each week.  Do not use any products that contain nicotine or tobacco, such as cigarettes, e-cigarettes, and chewing tobacco. If you need help quitting, ask your health care provider.  If you are sexually active, practice safe sex. Use a condom or other form of birth control (contraception) in order to prevent pregnancy and STIs (sexually transmitted infections).  If told by your health care provider, take low-dose aspirin daily starting at age 75. What's next?  Visit your health care provider once a year for a well check visit.  Ask your health care provider how often you should have your eyes and teeth checked.  Stay up to date on all vaccines. This information is not intended to replace advice given  to you by your health care provider. Make sure you discuss any questions you have with your health care provider. Document Revised: 09/16/2017 Document Reviewed: 09/16/2017 Elsevier Patient Education  2020 [employer].

## 2019-09-22 ENCOUNTER — Telehealth: Payer: Self-pay

## 2019-09-22 ENCOUNTER — Encounter: Payer: Self-pay | Admitting: Family Medicine

## 2019-09-22 DIAGNOSIS — R87610 Atypical squamous cells of undetermined significance on cytologic smear of cervix (ASC-US): Secondary | ICD-10-CM

## 2019-09-22 DIAGNOSIS — Z8619 Personal history of other infectious and parasitic diseases: Secondary | ICD-10-CM

## 2019-09-22 LAB — CYTOLOGY - PAP
Comment: NEGATIVE
Comment: NEGATIVE
Diagnosis: UNDETERMINED — AB
HPV 16: NEGATIVE
HPV 18 / 45: NEGATIVE
High risk HPV: POSITIVE — AB

## 2019-09-27 ENCOUNTER — Telehealth: Payer: Self-pay | Admitting: Obstetrics & Gynecology

## 2019-09-27 NOTE — Telephone Encounter (Signed)
[facility] referring for ASCUS of cervix with negative high risk HPV, History of HPV infection. Called and left voicemail for patient to call back to be scheduled.

## 2019-09-28 NOTE — Telephone Encounter (Signed)
Called and left voicemail for patient to call back to be scheduled. 

## 2019-09-29 ENCOUNTER — Other Ambulatory Visit: Payer: Self-pay | Admitting: Family Medicine

## 2019-09-29 DIAGNOSIS — E559 Vitamin D deficiency, unspecified: Secondary | ICD-10-CM

## 2019-09-29 NOTE — Telephone Encounter (Signed)
Requested  medications are  due for refill today yes  Requested medications are on the active medication list yes  Last refill 6/18  Last visit Aug 2020  Future visit scheduled Nov 2021  Notes to clinic Not Delegated.

## 2019-10-06 NOTE — Telephone Encounter (Signed)
Referral placed.

## 2019-10-23 ENCOUNTER — Encounter: Payer: Self-pay | Admitting: Family Medicine

## 2019-10-24 ENCOUNTER — Encounter: Payer: Self-pay | Admitting: Obstetrics & Gynecology

## 2019-10-24 ENCOUNTER — Ambulatory Visit (INDEPENDENT_AMBULATORY_CARE_PROVIDER_SITE_OTHER): Payer: Managed Care, Other (non HMO) | Admitting: Obstetrics & Gynecology

## 2019-10-24 ENCOUNTER — Other Ambulatory Visit: Payer: Self-pay

## 2019-10-24 VITALS — BP 140/96 | Ht 67.0 in | Wt 252.0 lb

## 2019-10-24 DIAGNOSIS — R87610 Atypical squamous cells of undetermined significance on cytologic smear of cervix (ASC-US): Secondary | ICD-10-CM | POA: Insufficient documentation

## 2019-10-24 DIAGNOSIS — R87810 Cervical high risk human papillomavirus (HPV) DNA test positive: Secondary | ICD-10-CM

## 2019-10-24 NOTE — Progress Notes (Signed)
Referring Provider:  Dr Ancil Boozer  HPI:  Tabitha Shaw is a 59 y.o.  G1P1  who presents today for evaluation and management of abnormal cervical cytology.    Dysplasia History:  ASCUS w HPV    Prior cryo to cervix in 2000s    Prior [place] in late 2000s for AUB  ROS:  Pertinent items are noted in HPI.  OB History  Gravida Para Term Preterm AB Living  _0 SAB TAB Ectopic Multiple Live Births               # Outcome Date GA Lbr Len/2nd Weight Sex Delivery Anes PTL Lv  1 Para             Obstetric Comments  1st Menstrual Cycle:  15  1st Pregnancy:  30    Past Medical History:  Diagnosis Date  . Acquired acanthosis nigricans   . Diabetes type 2, controlled ([place])    if controlled was not specified  . Hemochromatosis   . Hepatomegaly   . HLD (hyperlipidemia)   . HTN (hypertension)   . Hyperlipidemia   . Hypertension   . Hypertrophy of kidney   . Hypothyroidism   . Intertrigo   . Obesity   . Osteopenia   . Postinflammatory hyperpigmentation   . Snoring   . Thyroid disease   . Vitamin D deficiency     Past Surgical History:  Procedure Laterality Date  . ABDOMINAL HYSTERECTOMY  2005  . blood transfusion yearly    . BREAST REDUCTION SURGERY    . CESAREAN SECTION    . CESAREAN SECTION  1992  . CHOLECYSTECTOMY  2000  . COLONOSCOPY WITH PROPOFOL N/A 11/11/2018   Procedure: COLONOSCOPY WITH PROPOFOL;  Surgeon: Virgel Manifold, MD;  Location: [facility] ENDOSCOPY;  Service: Endoscopy;  Laterality: N/A;  . lapcholecystectomy    . PARATHYROIDECTOMY Right 11/26/2017   right lower , Dr[facility]aud[facility] at [facility]  . REDUCTION MAMMAPLASTY Bilateral 1985  . VESICOVAGINAL FISTULA CLOSURE W/ TAH      SOCIAL HISTORY: Social History   Substance and Sexual Activity  Alcohol Use Yes  . Alcohol/week: 0.0 standard drinks   Comment: occasionally   Social History   Substance and Sexual Activity  Drug Use No     Family History  Problem Relation Age of Onset  . Colon polyps  Mother   . Hemachromatosis Mother   . Hypertension Mother   . Depression Sister   . Hypertension Brother   . Colon polyps Brother   . Cancer Father   . Breast cancer Cousin   . Breast cancer Cousin     ALLERGIES:  Patient has no known allergies.  Current Outpatient Medications on File Prior to Visit  Medication Sig Dispense Refill  . amLODipine-benazepril (LOTREL) 5-20 MG capsule Take 1 capsule by mouth daily. 90 capsule 1  . aspirin 81 MG EC tablet Take 81 mg by mouth daily.      . blood glucose meter kit and supplies Dispense based on patient and insurance preference. Use up to four times daily as directed. (FOR ICD-10 E10.9, E11.9). 1 each 0  . clobetasol (TEMOVATE) 0.05 % external solution APP TO SCALP BID    . fluticasone (FLONASE) 50 MCG/ACT nasal spray SHAKE LIQUID AND USE 2 SPRAYS IN EACH NOSTRIL DAILY 48 g 0  . hydroquinone 4 % cream APP EXT TO FACE Q NIGHT    . ketoconazole (NIZORAL) 2 %  shampoo APP EXT AA 3 TIMES A WK UTD    . levothyroxine (SYNTHROID) 75 MCG tablet TAKE 1 TABLET BY MOUTH DAILY 90 tablet 2  . metFORMIN (GLUCOPHAGE-XR) 750 MG 24 hr tablet TAKE 1 TABLET BY MOUTH  DAILY WITH BREAKFAST 90 tablet 3  . ONE TOUCH ULTRA TEST test strip CHECK FASTING BLOOD SUGAR  TWICE WEEKLY 100 each 1  . OZEMPIC, 1 MG/DOSE, 4 MG/3ML SOPN Inject 1 mg into the skin once a week.    . pimecrolimus (ELIDEL) 1 % cream APP EXT AA BID    . rosuvastatin (CRESTOR) 40 MG tablet TAKE 1 TABLET BY MOUTH DAILY 90 tablet 2  . Vitamin D, Ergocalciferol, (DRISDOL) 1.25 MG (50000 UNIT) CAPS capsule TAKE ONE CAPSULE BY MOUTH TWICE A WEEK 24 capsule 1  . loratadine (CLARITIN) 10 MG tablet Take 1 tablet (10 mg total) by mouth daily. (Patient not taking: Reported on 09/19/2019) 30 tablet 0  . meloxicam (MOBIC) 7.5 MG tablet Mobic 7.5 mg tablet  Take 1 tablet twice a day by oral route. (Patient not taking: Reported on 09/19/2019)     No current facility-administered medications on file prior to visit.     Physical Exam: -Vitals:  BP (!) 140/96   Ht _0  (1.702 m)   Wt 252 lb (114.3 kg)   BMI 39.47 kg/m  GEN: WD, WN, NAD.  A+ O x 3, good mood and affect. ABD:  NT, ND.  Soft, no masses.  No hernias noted.   Pelvic:   Vulva: Normal appearance.  No lesions.  Vagina: No lesions or abnormalities noted.  Support: Normal pelvic support.  Urethra No masses tenderness or scarring.  Meatus Normal size without lesions or prolapse.  Cervix: See below.  Anus: Normal exam.  No lesions.  Perineum: Normal exam.  No lesions.        Bimanual   Uterus: Normal size.  Non-tender.  Mobile.  AV.  Adnexae: No masses.  Non-tender to palpation.  Cul-de-sac: Negative for abnormality.   PROCEDURE: 1.  Urine Pregnancy Test:  not done 2.  Colposcopy performed with 4% acetic acid after verbal consent obtained                                         -Aceto-white Lesions Location(s): none.              -Biopsy performed at 12, 6 o'clock               -ECC indicated and performed: Yes.  Scant specimen with narrow os and small cervix (prior LSH)     -Biopsy sites made hemostatic with pressure, AgNO3, and/or Monsel's solution   -Satisfactory colposcopy: Yes.      -Evidence of Invasive cervical CA :  NO  ASSESSMENT:  Tabitha Shaw is a 59 y.o. G1P1 here for  1. ASCUS with positive high risk HPV cervical   .  PLAN: 1.  I discussed the grading system of pap smears and HPV high risk viral types.  We will discuss and base management after colpo results return. 2. Follow up PAP 6 months, vs intervention if high grade dysplasia identified     Barnett Applebaum, MD, [facility], [facility] 10/24/2019  3:48 PM

## 2019-10-24 NOTE — Patient Instructions (Signed)

## 2019-10-27 LAB — ANATOMIC PATHOLOGY REPORT

## 2019-10-28 ENCOUNTER — Other Ambulatory Visit: Payer: Self-pay | Admitting: Family Medicine

## 2019-10-28 NOTE — Telephone Encounter (Signed)
Requested medication (s) are due for refill today: Amt. Not specified  Requested medication (s) are on the active medication list: yes  Last refill: 09/19/19  Future visit scheduled yes 11/22/19  Notes to clinic: Historical provider  Requested Prescriptions  Pending Prescriptions Disp Refills   OZEMPIC, 1 MG/DOSE, 4 MG/3ML SOPN [Pharmacy Med Name: OZEMPIC 1MG  PER DOSE (1X4MG  PEN)] 9 mL     Sig: INJECT 1MG  INTO THE SKIN ONCE A WEEK      Endocrinology:  Diabetes - GLP-1 Receptor Agonists Passed - 10/28/2019  3:37 AM      Passed - HBA1C is between 0 and 7.9 and within 180 days    Hemoglobin A1C  Date Value Ref Range Status  05/12/2019 6.0  Final   Hgb A1c MFr Bld  Date Value Ref Range Status  05/12/2019 6.0 (H) 4.8 - 5.6 % Final    Comment:             Prediabetes: 5.7 - 6.4          Diabetes: >6.4          Glycemic control for adults with diabetes: <7.0           Passed - Valid encounter within last 6 months    Recent Outpatient Visits           1 month ago Well adult exam   [facility] [place], Drue Stager, MD   4 months ago Upper respiratory tract infection, unspecified type   [facility] Steele Sizer, MD   5 months ago Type 2 diabetes mellitus with microalbuminuria, with long-term current use of insulin [facility])   [facility] [place], Drue Stager, MD   10 months ago Type 2 diabetes mellitus with microalbuminuria, with long-term current use of insulin [facility])   [facility] [place], Drue Stager, MD   1 year ago Type 2 diabetes mellitus with microalbuminuria, with long-term current use of insulin [facility])   [facility] Steele Sizer, MD       Future Appointments             In 3 weeks Steele Sizer, MD [facility], [facility]

## 2019-11-09 ENCOUNTER — Encounter: Payer: Self-pay | Admitting: Family Medicine

## 2019-11-10 ENCOUNTER — Encounter: Payer: Self-pay | Admitting: Internal Medicine

## 2019-11-10 ENCOUNTER — Ambulatory Visit: Payer: Managed Care, Other (non HMO) | Admitting: Internal Medicine

## 2019-11-10 ENCOUNTER — Other Ambulatory Visit: Payer: Self-pay

## 2019-11-10 VITALS — BP 124/82 | HR 98 | Temp 98.2°F | Resp 16 | Ht 66.25 in | Wt 252.0 lb

## 2019-11-10 DIAGNOSIS — R350 Frequency of micturition: Secondary | ICD-10-CM | POA: Diagnosis not present

## 2019-11-10 DIAGNOSIS — N3001 Acute cystitis with hematuria: Secondary | ICD-10-CM

## 2019-11-10 LAB — POCT URINALYSIS DIPSTICK
Appearance: ABNORMAL
Bilirubin, UA: NEGATIVE
Blood, UA: POSITIVE
Glucose, UA: NEGATIVE
Ketones, UA: NEGATIVE
Nitrite, UA: POSITIVE
Odor: ABNORMAL
Protein, UA: POSITIVE — AB
Spec Grav, UA: 1.025 (ref 1.010–1.025)
Urobilinogen, UA: 0.2 E.U./dL
pH, UA: 6 (ref 5.0–8.0)

## 2019-11-10 MED ORDER — SULFAMETHOXAZOLE-TRIMETHOPRIM 800-160 MG PO TABS: 1.0000 | ORAL_TABLET | Freq: Two times a day (BID) | ORAL | 0 refills | Status: AC

## 2019-11-10 NOTE — Progress Notes (Signed)
Patient ID: Tabitha Shaw, female    DOB: [birth year], 59 y.o.   MRN: [id number]  PCP: Steele Sizer, MD  No chief complaint on file.   Subjective:   Tabitha Shaw is a 59 y.o. female, presents to clinic with CC of the following:  No chief complaint on file.   HPI:  Patient is a 59 year old female patient of Dr. Ancil Boozer Last visit with her was 09/19/2019 She sent the following message to the office yesterday:     I think I have a UTI going to bathroom several time wondering if you could call me some medicine in I use [facility] near [place] The response was as follows:     Hi Mrs. Malloy,   We don't call in antibiotics, it is our office policy, please call and schedule a visit.  Take care,   Dr. Ancil Boozer She follows up today with UTI concerns  She notes couple days back, she started to develop symptoms that include frequency, pressure with urination, and minimal burning at times.  Denies any blood in the urine.  No flank pains, no fevers.  She notes it has been a long time since she has had a urinary tract infection.  Denies any nausea or vomiting. She works for [employer], and asked that the urine culture be sent to [employer] She denies any allergies to medications.  Patient Active Problem List   Diagnosis Date Noted  . ASCUS with positive high risk HPV cervical 10/24/2019  . Personal history of colonic polyps   . Polyp of colon   . Cervical high risk HPV (human papillomavirus) test positive 09/03/2016  . B12 deficiency 09/04/2015  . Elevated PTHrP level 09/04/2015  . Benign hypertension 09/12/2014  . Acanthosis nigricans 09/09/2014  . Diabetes mellitus with renal manifestation ([place]) 09/09/2014  . Dyslipidemia 09/09/2014  . Abnormal electrocardiogram 09/09/2014  . Enlarged kidney 09/09/2014  . 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[place]) 09/09/2014  . Enlarged liver 09/09/2014  . Calcium blood increased 09/09/2014  . Adult hypothyroidism 09/09/2014  .  Microalbuminuria 09/09/2014  . Morbid obesity due to excess calories ([place]) 09/09/2014  . Spinal stenosis of lumbar region with neurogenic claudication 09/09/2014  . Osteopenia 09/09/2014  . Vitamin D deficiency 09/09/2014      Current Outpatient Medications:  .  amLODipine-benazepril (LOTREL) 5-20 MG capsule, Take 1 capsule by mouth daily., Disp: 90 capsule, Rfl: 1 .  aspirin 81 MG EC tablet, Take 81 mg by mouth daily.  , Disp: , Rfl:  .  blood glucose meter kit and supplies, Dispense based on patient and insurance preference. Use up to four times daily as directed. (FOR ICD-10 E10.9, E11.9)., Disp: 1 each, Rfl: 0 .  fluticasone (FLONASE) 50 MCG/ACT nasal spray, SHAKE LIQUID AND USE 2 SPRAYS IN EACH NOSTRIL DAILY, Disp: 48 g, Rfl: 0 .  levothyroxine (SYNTHROID) 75 MCG tablet, TAKE 1 TABLET BY MOUTH DAILY, Disp: 90 tablet, Rfl: 2 .  metFORMIN (GLUCOPHAGE-XR) 750 MG 24 hr tablet, TAKE 1 TABLET BY MOUTH  DAILY WITH BREAKFAST, Disp: 90 tablet, Rfl: 3 .  ONE TOUCH ULTRA TEST test strip, CHECK FASTING BLOOD SUGAR  TWICE WEEKLY, Disp: 100 each, Rfl: 1 .  OZEMPIC, 1 MG/DOSE, 4 MG/3ML SOPN, INJECT 1MG INTO THE SKIN ONCE A WEEK, Disp: 9 mL, Rfl: 0 .  rosuvastatin (CRESTOR) 40 MG tablet, TAKE 1 TABLET BY MOUTH DAILY, Disp: 90 tablet, Rfl: 2 .  Vitamin D, Ergocalciferol, (DRISDOL) 1.25 MG (50000 UNIT) CAPS  capsule, TAKE ONE CAPSULE BY MOUTH TWICE A WEEK, Disp: 24 capsule, Rfl: 1   No Known Allergies   Past Surgical History:  Procedure Laterality Date  . ABDOMINAL HYSTERECTOMY  2005  . blood transfusion yearly    . BREAST REDUCTION SURGERY    . CESAREAN SECTION    . CESAREAN SECTION  1992  . CHOLECYSTECTOMY  2000  . COLONOSCOPY WITH PROPOFOL N/A 11/11/2018   Procedure: COLONOSCOPY WITH PROPOFOL;  Surgeon: Virgel Manifold, MD;  Location: [facility] ENDOSCOPY;  Service: Endoscopy;  Laterality: N/A;  . lapcholecystectomy    . PARATHYROIDECTOMY Right 11/26/2017   right lower , Dr. Maudie Mercury at [facility]  .  REDUCTION MAMMAPLASTY Bilateral 1985  . VESICOVAGINAL FISTULA CLOSURE W/ TAH       Family History  Problem Relation Age of Onset  . Colon polyps Mother   . Hemachromatosis Mother   . Hypertension Mother   . Depression Sister   . Hypertension Brother   . Colon polyps Brother   . Cancer Father   . Breast cancer Cousin   . Breast cancer Cousin      Social History   Tobacco Use  . Smoking status: Former Smoker    Packs/day: 0.25    Years: 8.00    Pack years: 2.00    Types: Cigarettes    Start date: 01/19/1982    Quit date: 01/19/1990    Years since quitting: 29.8  . Smokeless tobacco: Never Used  Substance Use Topics  . Alcohol use: Yes    Alcohol/week: 0.0 standard drinks    Comment: occasionally    With staff assistance, above reviewed with the patient today.  ROS: As per HPI, otherwise no specific complaints on a limited and focused system review   Results for orders placed or performed in visit on 11/10/19 (from the past 72 hour(s))  POCT Urinalysis Dipstick     Status: Abnormal   Collection Time: 11/10/19  1:47 PM  Result Value Ref Range   Color, UA Yellow    Clarity, UA Cloudy    Glucose, UA Negative Negative   Bilirubin, UA Negative    Ketones, UA Negative    Spec Grav, UA 1.025 1.010 - 1.025   Blood, UA Positive     Comment: Moderate   pH, UA 6.0 5.0 - 8.0   Protein, UA Positive (A) Negative   Urobilinogen, UA 0.2 0.2 or 1.0 E.U./dL   Nitrite, UA Positive    Leukocytes, UA Moderate (2+) (A) Negative   Appearance Abnormal    Odor Abnormal      PHQ2/9: Depression screen [facility] 2/9 11/10/2019 09/19/2019 06/23/2019 05/15/2019 12/09/2018  Decreased Interest 0 0 0 0 0  Down, Depressed, Hopeless 0 0 0 0 0  PHQ - 2 Score 0 0 0 0 0  Altered sleeping - 0 0 1 0  Tired, decreased energy - 0 0 0 0  Change in appetite - 0 0 0 0  Feeling bad or failure about yourself  - 0 0 0 0  Trouble concentrating - 0 0 0 0  Moving slowly or fidgety/restless - 0 0 0 0  Suicidal  thoughts - 0 0 0 0  PHQ-9 Score - 0 0 1 0  Difficult doing work/chores - - Not difficult at all Not difficult at all -  Some recent data might be hidden   PHQ-2/9 Result is neg  Fall Risk: Fall Risk  11/10/2019 09/19/2019 06/23/2019 05/15/2019 12/09/2018  Falls in the past year? 0  0 0 0 0  Number falls in past yr: 0 0 0 0 0  Injury with Fall? 0 0 0 0 0      Objective:   Vitals:   11/10/19 1343  BP: 124/82  Pulse: 98  Resp: 16  Temp: 98.2 F (36.8 C)  TempSrc: Oral  SpO2: 97%  Weight: 252 lb (114.3 kg)  Height: 5' 6.25" (1.683 m)    Body mass index is 40.37 kg/m.  Physical Exam   NAD, masked, pleasant HEENT - [place]/AT, sclera anicteric, conj - non-inj'ed Neck - supple, no adenopathy,  Car - RRR, not tachycardic Pulm- RR and effort normal at rest, Abd - soft, NT diffusely including nontender over the suprapubic region,  Back - no CVA tenderness Neuro/psychiatric - affect was not flat, appropriate with conversation  Alert with normal speech  Results for orders placed or performed in visit on 11/10/19  POCT Urinalysis Dipstick  Result Value Ref Range   Color, UA Yellow    Clarity, UA Cloudy    Glucose, UA Negative Negative   Bilirubin, UA Negative    Ketones, UA Negative    Spec Grav, UA 1.025 1.010 - 1.025   Blood, UA Positive    pH, UA 6.0 5.0 - 8.0   Protein, UA Positive (A) Negative   Urobilinogen, UA 0.2 0.2 or 1.0 E.U./dL   Nitrite, UA Positive    Leukocytes, UA Moderate (2+) (A) Negative   Appearance Abnormal    Odor Abnormal    Urine dip was positive as noted above    Assessment & Plan:    1. Urinary frequency 2. Acute cystitis with hematuria  Discussed with patient concern for an infection based on the urine dip, which was positive as noted above. will start an antibiotic, with Bactrim DS-1 twice daily prescribed. Also will send the urine for culture, and she wanted to do this through [place] Also emphasized staying well-hydrated presently.  If  symptoms not improving or more problematic, she should follow-up.  - Urine Culture - sulfamethoxazole-trimethoprim (BACTRIM DS) 800-160 MG tablet; Take 1 tablet by mouth 2 (two) times daily for 5 days.  Dispense: 10 tablet; Refill: 0        CLIFFORD D HENDRICKSON, MD 11/10/19 2:00 PM

## 2019-11-10 NOTE — Patient Instructions (Signed)
Urinary Tract Infection, Adult A urinary tract infection (UTI) is an infection of any part of the urinary tract. The urinary tract includes:  The kidneys.  The ureters.  The bladder.  The urethra. These organs make, store, and get rid of pee (urine) in the body. What are the causes? This is caused by germs (bacteria) in your genital area. These germs grow and cause swelling (inflammation) of your urinary tract. What increases the risk? You are more likely to develop this condition if:  You have a small, thin tube (catheter) to drain pee.  You cannot control when you pee or poop (incontinence).  You are female, and: ? You use these methods to prevent pregnancy:  A medicine that kills sperm (spermicide).  A device that blocks sperm (diaphragm). ? You have low levels of a female hormone (estrogen). ? You are pregnant.  You have genes that add to your risk.  You are sexually active.  You take antibiotic medicines.  You have trouble peeing because of: ? A prostate that is bigger than normal, if you are female. ? A blockage in the part of your body that drains pee from the bladder (urethra). ? A kidney stone. ? A nerve condition that affects your bladder (neurogenic bladder). ? Not getting enough to drink. ? Not peeing often enough.  You have other conditions, such as: ? Diabetes. ? A weak disease-fighting system (immune system). ? Sickle cell disease. ? Gout. ? Injury of the spine. What are the signs or symptoms? Symptoms of this condition include:  Needing to pee right away (urgently).  Peeing often.  Peeing small amounts often.  Pain or burning when peeing.  Blood in the pee.  Pee that smells bad or not like normal.  Trouble peeing.  Pee that is cloudy.  Fluid coming from the vagina, if you are female.  Pain in the belly or lower back. Other symptoms include:  Throwing up (vomiting).  No urge to eat.  Feeling mixed up (confused).  Being tired  and grouchy (irritable).  A fever.  Watery poop (diarrhea). How is this treated? This condition may be treated with:  Antibiotic medicine.  Other medicines.  Drinking enough water. Follow these instructions at home:  Medicines  Take over-the-counter and prescription medicines only as told by your doctor.  If you were prescribed an antibiotic medicine, take it as told by your doctor. Do not stop taking it even if you start to feel better. General instructions  Make sure you: ? Pee until your bladder is empty. ? Do not hold pee for a long time. ? Empty your bladder after sex. ? Wipe from front to back after pooping if you are a female. Use each tissue one time when you wipe.  Drink enough fluid to keep your pee pale yellow.  Keep all follow-up visits as told by your doctor. This is important. Contact a doctor if:  You do not get better after 1-2 days.  Your symptoms go away and then come back. Get help right away if:  You have very bad back pain.  You have very bad pain in your lower belly.  You have a fever.  You are sick to your stomach (nauseous).  You are throwing up. Summary  A urinary tract infection (UTI) is an infection of any part of the urinary tract.  This condition is caused by germs in your genital area.  There are many risk factors for a UTI. These include having a small, thin   tube to drain pee and not being able to control when you pee or poop.  Treatment includes antibiotic medicines for germs.  Drink enough fluid to keep your pee pale yellow. This information is not intended to replace advice given to you by your health care provider. Make sure you discuss any questions you have with your health care provider. Document Revised: 12/23/2017 Document Reviewed: 07/15/2017 Elsevier Patient Education  2020 [employer]vier Inc.  

## 2019-11-13 ENCOUNTER — Telehealth: Payer: Self-pay

## 2019-11-13 NOTE — Telephone Encounter (Signed)
Returned patients call. Informed patient it is okay for her to finish her antibiotics. Patient states she will be finished on Wednesday. Pt verbalized understanding.

## 2019-11-14 ENCOUNTER — Other Ambulatory Visit: Payer: Self-pay | Admitting: Family Medicine

## 2019-11-14 DIAGNOSIS — I10 Essential (primary) hypertension: Secondary | ICD-10-CM

## 2019-11-15 ENCOUNTER — Telehealth: Payer: Self-pay

## 2019-11-15 NOTE — Telephone Encounter (Signed)
Returned patients call. Patient had left a voicemail saying she was informed her procedure had been cancelled. I called to the Endo unit to see why and there was no note provided. Informed patient she was placed back on the schedule. Pt stated she would like to just reschedule for another time. Pt will call to reschedule.

## 2019-11-16 LAB — SPECIMEN STATUS REPORT

## 2019-11-16 LAB — URINE CULTURE

## 2019-11-17 ENCOUNTER — Ambulatory Visit: Admit: 2019-11-17 | Payer: Managed Care, Other (non HMO) | Admitting: Gastroenterology

## 2019-11-17 SURGERY — COLONOSCOPY WITH PROPOFOL
Anesthesia: General

## 2019-11-21 ENCOUNTER — Telehealth: Payer: Self-pay

## 2019-11-21 NOTE — Progress Notes (Signed)
Name: Tabitha Shaw   MRN: [id number]    DOB: 03/27/1960   Date:11/22/2019       Progress Note  Subjective  Chief Complaint  Follow up   HPI   DMII :hgbA1Cspiked to 8.1% back in August 2020 it was  down to 6 % on her last visit and today is up to 6.7 % She states glucose at home has been in the 130's and once it went up to 200 . Sheis now on Ozempic and Metformin. She states she has been eating more bread, specially bagels from breakfast . Denies polyphagia, polydipsia or polyuria, but she has nocturia once per night - stable. She has been taking benazepril for microalbuminuria. LastLDL had gone  up to 162 and last week down to 76 , she states she will try to improve her diet   HTN: taking medication daily and denies side effects,she was not able to check bp at home, no chest pain, palpitationordizziness.Continue Lotrel 5/20.  Hyperlipidemia: she was taking Atorvastatin but LDL was high at 162 , so we August we switched to Crestor seems to be working better with LDL down to 76, we discussed adding Zetia but she would like to hold off for now  .  Hypothyroidism: she is taking Synthroid26mgdaily , no change in bowel movement of dry skin, denies dysphagia or weight gain   Morbid obesity::she has lost 10 lbs since started on GLP-1 agonist and also taking Metformin back 08/2018 , she is walking three days a week for at least 30 minutes. She has been drinking more water, she still working from home and is eating out less often, but has increased the amount of carbs in her diet lately and will try to improve her diet   PrimaryHyperparathyroidism: seen by Dr. SGabriel Carinaand referred to Dr. KMaudie Mercury found to have a right lower parathyroid enlargement and had surgery on 11/26/2017,and has been released since, last Pth war normal. Denies cramps, GERD symptoms. Feeling well   Hemochromatosis : sees Dr FGrayland Ormondand has phlebotomy. Last treatment was 03/2019 ,removal of 300 ml , she  went back to see him in June and did not need to have phlebotomy   Colon polyps: colonoscopy was postponed, scheduling mistake but will get it done in Dec   Memory difficulties: she is worried because daughter noticed she has been forgetful, there is a family history of Alzheimer's dementia, and her 23&Me test was positive. 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 HKenton Kingfisherand is going back in April for another pap smear  Patient Active Problem List   Diagnosis Date Noted  . ASCUS with positive high risk HPV cervical 10/24/2019  . Personal history of colonic polyps   . Polyp of colon   . Cervical high risk HPV (human papillomavirus) test positive 09/03/2016  . B12 deficiency 09/04/2015  . Elevated PTHrP level 09/04/2015  . Benign hypertension 09/12/2014  . Acanthosis nigricans 09/09/2014  . Diabetes mellitus with renal manifestation ([place] 09/09/2014  . Dyslipidemia 09/09/2014  . Abnormal electrocardiogram 09/09/2014  . Enlarged kidney 09/09/2014  . 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[place] 09/09/2014  . Enlarged liver 09/09/2014  . Calcium blood increased 09/09/2014  . Adult hypothyroidism 09/09/2014  . Microalbuminuria 09/09/2014  . Morbid obesity due to excess calories ([place] 09/09/2014  . Spinal stenosis of lumbar region with  neurogenic claudication 09/09/2014  . Osteopenia 09/09/2014  . Vitamin D deficiency 09/09/2014    Past Surgical History:  Procedure Laterality Date  . ABDOMINAL HYSTERECTOMY  2005  . blood transfusion yearly    . BREAST REDUCTION SURGERY    . CESAREAN SECTION    . CESAREAN SECTION  1992  . CHOLECYSTECTOMY  2000  . COLONOSCOPY WITH PROPOFOL N/A 11/11/2018   Procedure: COLONOSCOPY WITH PROPOFOL;  Surgeon: Virgel Manifold, MD;  Location: [facility] ENDOSCOPY;  Service: Endoscopy;  Laterality: N/A;  . lapcholecystectomy     . PARATHYROIDECTOMY Right 11/26/2017   right lower , Dr. Maudie Mercury at [facility]  . REDUCTION MAMMAPLASTY Bilateral 1985  . VESICOVAGINAL FISTULA CLOSURE W/ TAH      Family History  Problem Relation Age of Onset  . Colon polyps Mother   . Hemachromatosis Mother   . Hypertension Mother   . Depression Sister   . Hypertension Brother   . Colon polyps Brother   . Cancer Father   . Breast cancer Cousin   . Breast cancer Cousin     Social History   Tobacco Use  . Smoking status: Former Smoker    Packs/day: 0.25    Years: 8.00    Pack years: 2.00    Types: Cigarettes    Start date: 01/19/1982    Quit date: 01/19/1990    Years since quitting: 29.8  . Smokeless tobacco: Never Used  Substance Use Topics  . Alcohol use: Yes    Alcohol/week: 0.0 standard drinks    Comment: occasionally     Current Outpatient Medications:  .  amLODipine-benazepril (LOTREL) 5-20 MG capsule, TAKE 1 CAPSULE BY MOUTH DAILY, Disp: 90 capsule, Rfl: 0 .  aspirin 81 MG EC tablet, Take 81 mg by mouth daily.  , Disp: , Rfl:  .  blood glucose meter kit and supplies, Dispense based on patient and insurance preference. Use up to four times daily as directed. (FOR ICD-10 E10.9, E11.9)., Disp: 1 each, Rfl: 0 .  fluticasone (FLONASE) 50 MCG/ACT nasal spray, SHAKE LIQUID AND USE 2 SPRAYS IN EACH NOSTRIL DAILY, Disp: 48 g, Rfl: 0 .  levothyroxine (SYNTHROID) 75 MCG tablet, TAKE 1 TABLET BY MOUTH DAILY, Disp: 90 tablet, Rfl: 2 .  metFORMIN (GLUCOPHAGE-XR) 750 MG 24 hr tablet, TAKE 1 TABLET BY MOUTH  DAILY WITH BREAKFAST, Disp: 90 tablet, Rfl: 3 .  ONE TOUCH ULTRA TEST test strip, CHECK FASTING BLOOD SUGAR  TWICE WEEKLY, Disp: 100 each, Rfl: 1 .  OZEMPIC, 1 MG/DOSE, 4 MG/3ML SOPN, INJECT 1MG INTO THE SKIN ONCE A WEEK, Disp: 9 mL, Rfl: 0 .  rosuvastatin (CRESTOR) 40 MG tablet, TAKE 1 TABLET BY MOUTH DAILY, Disp: 90 tablet, Rfl: 2 .  SUPREP BOWEL PREP KIT 17.5-3.13-1.6 GM/177ML SOLN, Take by mouth as directed., Disp: , Rfl:  .   Vitamin D, Ergocalciferol, (DRISDOL) 1.25 MG (50000 UNIT) CAPS capsule, TAKE ONE CAPSULE BY MOUTH TWICE A WEEK, Disp: 24 capsule, Rfl: 1  No Known Allergies  I personally reviewed active problem list, medication list, allergies, family history, social history, health maintenance with the patient/caregiver today.   ROS  Constitutional: Negative for fever or weight change.  Respiratory: Negative for cough and shortness of breath.   Cardiovascular: Negative for chest pain or palpitations.  Gastrointestinal: Negative for abdominal pain, no bowel changes.  Musculoskeletal: Negative for gait problem or joint swelling.  Skin: Negative for rash.  Neurological: Negative for dizziness or headache.  No other specific complaints in a  complete review of systems (except as listed in HPI above).  Objective  Vitals:   11/22/19 0746  BP: 132/80  Pulse: 90  Resp: 17  Temp: 98.1 F (36.7 C)  TempSrc: Oral  SpO2: 98%  Weight: 247 lb 14.4 oz (112.4 kg)  Height: _0  (1.676 m)    Body mass index is 40.01 kg/m.  Physical Exam  Constitutional: Patient appears well-developed and well-nourished. Obese  No distress.  HEENT: head atraumatic, normocephalic, pupils equal and reactive to light, neck supple Cardiovascular: Normal rate, regular rhythm and normal heart sounds.  No murmur heard. No BLE edema. Pulmonary/Chest: Effort normal and breath sounds normal. No respiratory distress. Abdominal: Soft.  There is no tenderness. Psychiatric: Patient has a normal mood and affect. behavior is normal. Judgment and thought content normal.  Recent Results (from the past 2160 hour(s))  Cytology - PAP     Status: Abnormal   Collection Time: 09/19/19  8:30 AM  Result Value Ref Range   High risk HPV Positive (A)    HPV 16 Negative    HPV 18 / 45 Negative    Adequacy      Satisfactory for evaluation; transformation zone component PRESENT.   Diagnosis (A)     - Atypical squamous cells of undetermined  significance (ASC-US)   Comment Normal Reference Range HPV - Negative    Comment Normal Reference Range HPV 16 18 45 -Negative   Pathology ([facility])     Status: Abnormal   Collection Time: 10/24/19  3:50 PM  Result Value Ref Range   Diagnosis synopsis: See below: (A)     Comment: Specimen A-Endocervical Curettings: VERY SCANT TISSUE WITH MINUTE FRAGMENTS OF SQUAMOUS METAPLASIA ONLY. Specimen B-Cervical Biopsy, cervix at 6:00: LOW-GRADE SQUAMOUS INTRAEPITHELIAL LESION (CIN 1). Specimen C-Cervical Biopsy, cervix at 12:00: BENIGN ECTOCERVICAL TISSUE WITH FOCAL CHRONIC INFLAMMATION. TRANSFORMATION ZONE NOT REPRESENTED.    Specimen: Comment     Comment: Specimen A-Endocervical Curettings Specimen B-Cervical Biopsy, cervix at 6:00 Specimen C-Cervical Biopsy, cervix at 12:00    Diagnosis: See below: (A)     Comment: Specimen A-VERY SCANT TISSUE WITH MINUTE FRAGMENTS OF SQUAMOUS METAPLASIA ONLY. Specimen B-LOW-GRADE SQUAMOUS INTRAEPITHELIAL LESION (CIN Specimen C-BENIGN ECTOCERVICAL TISSUE WITH FOCAL CHRONIC INFLAMMATION. TRANSFORMATION ZONE NOT REPRESENTED.    Gross description: Comment     Comment: The specimen is received in formalin labeled "GUYE MALLOY, Presley, ecc". Received is a 0.1 x 0.1 x 0.1 cm aggregate of tan, mucinous material. The specimen is submitted entirely in cassette 1. The specimen is received in formalin labeled "GUYE MALLOY, Jaretzi, 6:00". Received is 1 fragment measuring 0.4 cm. entirely in cassette 1. The specimen is received in formalin labeled "GUYE MALLOY, [place], 12:00". Received is 1 fragment measuring 0.2 cm. entirely in cassette 1.    Electronically signed by: Comment     Comment: Heidi Dach, MD, Pathologist   CPT code(s): Comment     Comment: Specimen [id number] Specimen [id number] Specimen [phone]    CPT Disclaimer: Comment     Comment: CPT codes, as published by the AMA, are provided for informational purpose only as the assignment of the  CPT codes is the responsibility of the billing party.    Clinician provided ICD: Comment     Comment: R87.610, R87.810   Pathologist provided ICD: N87.0   POCT Urinalysis Dipstick     Status: Abnormal   Collection Time: 11/10/19  1:47 PM  Result Value Ref Range   Color, UA Yellow    Clarity, UA  Cloudy    Glucose, UA Negative Negative   Bilirubin, UA Negative    Ketones, UA Negative    Spec Grav, UA 1.025 1.010 - 1.025   Blood, UA Positive     Comment: Moderate   pH, UA 6.0 5.0 - 8.0   Protein, UA Positive (A) Negative   Urobilinogen, UA 0.2 0.2 or 1.0 E.U./dL   Nitrite, UA Positive    Leukocytes, UA Moderate (2+) (A) Negative   Appearance Abnormal    Odor Abnormal   Urine Culture     Status: Abnormal   Collection Time: 11/13/19 12:00 AM   Specimen: Urine   Urine  Result Value Ref Range   Urine Culture, Routine Final report (A)    Organism ID, Bacteria Escherichia coli (A)     Comment: Cefazolin <=4 ug/mL Cefazolin with an MIC <=16 predicts susceptibility to the oral agents cefaclor, cefdinir, cefpodoxime, cefprozil, cefuroxime, cephalexin, and loracarbef when used for therapy of uncomplicated urinary tract infections due to E. coli, Klebsiella pneumoniae, and Proteus mirabilis. Greater than 100,000 colony forming units per mL    Antimicrobial Susceptibility Comment     Comment:       ** S = Susceptible; I = Intermediate; R = Resistant **                    P = Positive; N = Negative             MICS are expressed in micrograms per mL    Antibiotic                 RSLT#1    RSLT#2    RSLT#3    RSLT#4 Amoxicillin/Clavulanic Acid    S Ampicillin                     S Cefepime                       S Ceftriaxone                    S Cefuroxime                     S Ciprofloxacin                  S Ertapenem                      S Gentamicin                     S Imipenem                       S Levofloxacin                   S Meropenem                       S Nitrofurantoin                 S Piperacillin/Tazobactam        S Tetracycline                   S Tobramycin                     S Trimethoprim/Sulfa  S   Specimen status report     Status: None   Collection Time: 11/13/19 12:00 AM  Result Value Ref Range   specimen status report Comment     Comment: Please note Please note The date and/or time of collection was not indicated on the requisition as required by state and federal law.  The date of receipt of the specimen was used as the collection date if not supplied.   POCT HgB A1C     Status: Abnormal   Collection Time: 11/22/19  7:51 AM  Result Value Ref Range   Hemoglobin A1C 6.7 (A) 4.0 - 5.6 %   HbA1c POC (<> result, manual entry)     HbA1c, POC (prediabetic range)     HbA1c, POC (controlled diabetic range)        PHQ2/9: Depression screen [facility] 2/9 11/22/2019 11/10/2019 09/19/2019 06/23/2019 05/15/2019  Decreased Interest 0 0 0 0 0  Down, Depressed, Hopeless 0 0 0 0 0  PHQ - 2 Score 0 0 0 0 0  Altered sleeping - - 0 0 1  Tired, decreased energy - - 0 0 0  Change in appetite - - 0 0 0  Feeling bad or failure about yourself  - - 0 0 0  Trouble concentrating - - 0 0 0  Moving slowly or fidgety/restless - - 0 0 0  Suicidal thoughts - - 0 0 0  PHQ-9 Score - - 0 0 1  Difficult doing work/chores - - - Not difficult at all Not difficult at all  Some recent data might be hidden    phq 9 is negative   Fall Risk: Fall Risk  11/22/2019 11/10/2019 09/19/2019 06/23/2019 05/15/2019  Falls in the past year? 0 0 0 0 0  Number falls in past yr: 0 0 0 0 0  Injury with Fall? 0 0 0 0 0      Functional Status Survey: Is the patient deaf or have difficulty hearing?: No Does the patient have difficulty seeing, even when wearing glasses/contacts?: No Does the patient have difficulty concentrating, remembering, or making decisions?: No Does the patient have difficulty walking or climbing stairs?: No Does the patient have  difficulty dressing or bathing?: No Does the patient have difficulty doing errands alone such as visiting a doctor's office or shopping?: No    Assessment & Plan  1. Type 2 diabetes mellitus with microalbuminuria, with long-term current use of insulin (HCC)  - POCT HgB A1C - metFORMIN (GLUCOPHAGE-XR) 750 MG 24 hr tablet; Take 1 tablet (750 mg total) by mouth daily with breakfast.  Dispense: 90 tablet; Refill: 3  2. Dyslipidemia  Continue crestor and change diet   3. 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[place])   Under the care of Dr. Grayland Ormond   4. Benign hypertension  - amLODipine-benazepril (LOTREL) 5-20 MG capsule; Take 1 capsule by mouth daily.  Dispense: 90 capsule; Refill: 0  5. B12 deficiency  Needs to resume supplementation   6. Vitamin D deficiency   Continue supplementation   7. Adult hypothyroidism  Continue current dose of thyroid medication  8. History of parathyroidectomy ([place])  Last level at goal   9. Morbid obesity with BMI of 40.0-44.9, adult [facility])  Discussed with the patient the risk posed by an increased BMI. Discussed importance of portion control, calorie counting and at least 150 minutes of physical activity weekly. Avoid sweet beverages and drink more water. Eat at least 6 servings of fruit and vegetables daily   10. Dysplasia of  cervix, low grade (CIN 1)  Keep follow up with Dr. Kenton Kingfisher   11. Need for shingles vaccine  - Varicella-zoster vaccine IM

## 2019-11-21 NOTE — Telephone Encounter (Signed)
Returned patients call to schedule her colonoscopy.  Originally scheduled for 11/17/19 however she said her appt was canceled for some reason. She will call the office back to schedule with Dr. Bonna Gains.  She is thinking about Friday 01/05/20 at [facility].  She already has her bowel prep.  Advised that COVID testing will be on the Wed prior to the Friday's procedure. Will await for her to call back to confirm the date.  Thanks,  [place], [place]

## 2019-11-22 ENCOUNTER — Ambulatory Visit: Payer: Managed Care, Other (non HMO) | Admitting: Family Medicine

## 2019-11-22 ENCOUNTER — Encounter: Payer: Self-pay | Admitting: Family Medicine

## 2019-11-22 ENCOUNTER — Other Ambulatory Visit: Payer: Self-pay

## 2019-11-22 VITALS — BP 132/80 | HR 90 | Temp 98.1°F | Resp 17 | Ht 66.0 in | Wt 247.9 lb

## 2019-11-22 DIAGNOSIS — Z23 Encounter for immunization: Secondary | ICD-10-CM | POA: Diagnosis not present

## 2019-11-22 DIAGNOSIS — E785 Hyperlipidemia, unspecified: Secondary | ICD-10-CM | POA: Diagnosis not present

## 2019-11-22 DIAGNOSIS — E892 Postprocedural hypoparathyroidism: Secondary | ICD-10-CM

## 2019-11-22 DIAGNOSIS — R809 Proteinuria, unspecified: Secondary | ICD-10-CM | POA: Diagnosis not present

## 2019-11-22 DIAGNOSIS — I10 Essential (primary) hypertension: Secondary | ICD-10-CM | POA: Diagnosis not present

## 2019-11-22 DIAGNOSIS — N870 Mild cervical dysplasia: Secondary | ICD-10-CM

## 2019-11-22 DIAGNOSIS — Z794 Long term (current) use of insulin: Secondary | ICD-10-CM | POA: Diagnosis not present

## 2019-11-22 DIAGNOSIS — Z6841 Body Mass Index (BMI) 40.0 and over, adult: Secondary | ICD-10-CM

## 2019-11-22 DIAGNOSIS — E1129 Type 2 diabetes mellitus with other diabetic kidney complication: Secondary | ICD-10-CM | POA: Diagnosis not present

## 2019-11-22 DIAGNOSIS — E039 Hypothyroidism, unspecified: Secondary | ICD-10-CM

## 2019-11-22 DIAGNOSIS — Z1211 Encounter for screening for malignant neoplasm of colon: Secondary | ICD-10-CM

## 2019-11-22 DIAGNOSIS — E559 Vitamin D deficiency, unspecified: Secondary | ICD-10-CM

## 2019-11-22 DIAGNOSIS — E538 Deficiency of other specified B group vitamins: Secondary | ICD-10-CM

## 2019-11-22 LAB — POCT GLYCOSYLATED HEMOGLOBIN (HGB A1C): Hemoglobin A1C: 6.7 % — AB (ref 4.0–5.6)

## 2019-11-22 MED ORDER — METFORMIN HCL ER 750 MG PO TB24: 750.0000 mg | ORAL_TABLET | Freq: Every day | ORAL | 3 refills | Status: DC

## 2019-11-22 MED ORDER — AMLODIPINE BESY-BENAZEPRIL HCL 5-20 MG PO CAPS: 1.0000 | ORAL_CAPSULE | Freq: Every day | ORAL | 0 refills | Status: DC

## 2019-12-27 ENCOUNTER — Other Ambulatory Visit: Payer: Self-pay

## 2019-12-27 DIAGNOSIS — Z86010 Personal history of colonic polyps: Secondary | ICD-10-CM

## 2020-01-14 NOTE — Progress Notes (Signed)
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  INTERVAL HISTORY: Patient returns to clinic today for routine 60-monthevaluation and continuation of phlebotomy.  She continues to feel well and remains asymptomatic. She has no neurologic complaints.  She denies any recent fevers or illnesses.  She denies any chest pain, shortness of breath, cough, or hemoptysis.  She denies any weakness or fatigue.  She has a good appetite and denies weight loss. She denies any nausea, vomiting, constipation, or diarrhea. She has no urinary complaints.  Patient offers no specific complaints today.  REVIEW OF SYSTEMS:   Review of Systems  Constitutional: Negative.  Negative for fever, malaise/fatigue and weight loss.  Respiratory: Negative.  Negative for cough and shortness of breath.   Cardiovascular: Negative.  Negative for chest pain and leg swelling.  Gastrointestinal: Negative.  Negative for abdominal pain, blood in stool and melena.  Genitourinary: Negative.  Negative for dysuria.  Musculoskeletal: Negative.  Negative for back pain.  Skin: Negative.  Negative for rash.  Neurological: Negative.  Negative for sensory change, focal weakness and weakness.  Psychiatric/Behavioral: Negative.  The patient is not nervous/anxious.     As per HPI. Otherwise, a complete review of systems is negative.  PAST MEDICAL HISTORY: Past Medical History:  Diagnosis Date  . Acquired acanthosis nigricans   . Diabetes type 2, controlled ([place]    if controlled was not specified  . Hemochromatosis   .  Hepatomegaly   . HLD (hyperlipidemia)   . HTN (hypertension)   . Hyperlipidemia   . Hypertension   . Hypertrophy of kidney   . Hypothyroidism   . Intertrigo   . Obesity   . Osteopenia   . Postinflammatory hyperpigmentation   . Snoring   . Thyroid disease   . Vitamin D deficiency     PAST SURGICAL HISTORY: Past Surgical History:  Procedure Laterality Date  . ABDOMINAL HYSTERECTOMY  2005  . blood transfusion yearly    . BREAST REDUCTION SURGERY    . CESAREAN SECTION    . CESAREAN SECTION  1992  . CHOLECYSTECTOMY  2000  . COLONOSCOPY WITH PROPOFOL N/A 11/11/2018   Procedure: COLONOSCOPY WITH PROPOFOL;  Surgeon: TVirgel Manifold MD;  Location: [facility] ENDOSCOPY;  Service: Endoscopy;  Laterality: N/A;  . lapcholecystectomy    . PARATHYROIDECTOMY Right 11/26/2017   [facility] . REDUCTION MAMMAPLASTY Bilateral 1985  . VESICOVAGINAL FISTULA CLOSURE W/ TAH      FAMILY HISTORY Family History  Problem Relation Age of Onset  . Colon polyps Mother   . Hemachromatosis Mother   . Hypertension Mother   . Depression Sister   . Hypertension Brother   . Colon polyps Brother   . Cancer Father   . Breast cancer Cousin   . Breast cancer Cousin        ADVANCED DIRECTIVES:    HEALTH MAINTENANCE: Social History   Tobacco Use  . Smoking status: Former Smoker    Packs/day: 0.25    Years: 8.00    Pack years: 2.00    Types: Cigarettes    Start date: 01/19/1982    Quit  date: 01/19/1990    Years since quitting: 30.0  . Smokeless tobacco: Never Used  Vaping Use  . Vaping Use: Never used  Substance Use Topics  . Alcohol use: Yes    Alcohol/week: 0.0 standard drinks    Comment: occasionally  . Drug use: No     Colonoscopy:  PAP:  Bone density:  Lipid panel:  No Known Allergies  Current Outpatient Medications  Medication Sig Dispense Refill  . amLODipine-benazepril (LOTREL) 5-20 MG capsule Take 1 capsule by mouth daily. 90 capsule 0  . aspirin 81 MG  EC tablet Take 81 mg by mouth daily.    . blood glucose meter kit and supplies Dispense based on patient and insurance preference. Use up to four times daily as directed. (FOR ICD-10 E10.9, E11.9). 1 each 0  . fluticasone (FLONASE) 50 MCG/ACT nasal spray SHAKE LIQUID AND USE 2 SPRAYS IN EACH NOSTRIL DAILY 48 g 0  . levothyroxine (SYNTHROID) 75 MCG tablet TAKE 1 TABLET BY MOUTH DAILY 90 tablet 2  . metFORMIN (GLUCOPHAGE-XR) 750 MG 24 hr tablet Take 1 tablet (750 mg total) by mouth daily with breakfast. 90 tablet 3  . ONE TOUCH ULTRA TEST test strip CHECK FASTING BLOOD SUGAR  TWICE WEEKLY 100 each 1  . OZEMPIC, 1 MG/DOSE, 4 MG/3ML SOPN INJECT 1MG INTO THE SKIN ONCE A WEEK 9 mL 0  . rosuvastatin (CRESTOR) 40 MG tablet TAKE 1 TABLET BY MOUTH DAILY 90 tablet 2  . Vitamin D, Ergocalciferol, (DRISDOL) 1.25 MG (50000 UNIT) CAPS capsule TAKE ONE CAPSULE BY MOUTH TWICE A WEEK 24 capsule 1  . SUPREP BOWEL PREP KIT 17.5-3.13-1.6 GM/177ML SOLN Take by mouth as directed. (Patient not taking: Reported on 01/18/2020)     No current facility-administered medications for this visit.    OBJECTIVE: Vitals:   01/18/20 1433  BP: 131/82  Pulse: 78  Temp: 98.3 F (36.8 C)  SpO2: 99%     Body mass index is 41.55 kg/m.    ECOG FS:0 - Asymptomatic  General: Well-developed, well-nourished, no acute distress. Eyes: Pink conjunctiva, anicteric sclera. HEENT: Normocephalic, moist mucous membranes. Lungs: No audible wheezing or coughing. Heart: Regular rate and rhythm. Abdomen: Soft, nontender, no obvious distention. Musculoskeletal: No edema, cyanosis, or clubbing. Neuro: Alert, answering all questions appropriately. Cranial nerves grossly intact. Skin: No rashes or petechiae noted. Psych: Normal affect.  LAB RESULTS:  Lab Results  Component Value Date   NA 141 05/12/2019   K 4.5 05/12/2019   CL 100 05/12/2019   CO2 27 05/12/2019   GLUCOSE 123 (H) 05/12/2019   BUN 11 05/12/2019   CREATININE 0.81  05/12/2019   CALCIUM 10.1 05/12/2019   PROT 7.6 05/12/2019   ALBUMIN 4.8 05/12/2019   AST 25 05/12/2019   ALT 24 05/12/2019   ALKPHOS 72 05/12/2019   BILITOT 0.8 05/12/2019   GFRNONAA 81 05/12/2019   GFRAA 94 05/12/2019    Lab Results  Component Value Date   WBC 7.5 08/26/2017   NEUTROABS 3.7 08/26/2017   HGB 14.9 08/26/2017   HCT 42.4 08/26/2017   MCV 102 (H) 08/26/2017   PLT 253 08/26/2017     STUDIES: No results found.  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  PLAN:    1.  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  All of her other laboratory work continues to be within normal limits.  Goal ferritin is between 50 and 100.  Proceed with 500  mL phlebotomy today.  Return to clinic in 6 months with repeat laboratory work, further evaluation, and continuation of treatment if needed.  Patient continues to get all of her laboratory work at [facility].   2.  Parathyroid surgery: Continue treatment and follow-up at [facility].  I spent a total of 20 minutes reviewing chart data, face-to-face evaluation with the patient, counseling and coordination of care as detailed above.    Patient expressed understanding and was in agreement with this plan. She also understands that She can call clinic at any time with any questions, concerns, or complaints.    Lloyd Huger, MD   01/19/2020 8:36 AM

## 2020-01-16 ENCOUNTER — Encounter: Payer: Self-pay | Admitting: Oncology

## 2020-01-17 ENCOUNTER — Encounter: Payer: Self-pay | Admitting: Family Medicine

## 2020-01-18 ENCOUNTER — Inpatient Hospital Stay: Payer: Managed Care, Other (non HMO) | Attending: Oncology | Admitting: Oncology

## 2020-01-18 ENCOUNTER — Other Ambulatory Visit: Payer: Self-pay

## 2020-01-18 ENCOUNTER — Inpatient Hospital Stay: Payer: Managed Care, Other (non HMO)

## 2020-01-18 ENCOUNTER — Encounter: Payer: Self-pay | Admitting: Oncology

## 2020-01-18 DIAGNOSIS — Z86010 Personal history of colonic polyps: Secondary | ICD-10-CM | POA: Diagnosis not present

## 2020-01-18 DIAGNOSIS — Z87891 Personal history of nicotine dependence: Secondary | ICD-10-CM | POA: Insufficient documentation

## 2020-01-18 DIAGNOSIS — Z809 Family history of malignant neoplasm, unspecified: Secondary | ICD-10-CM | POA: Insufficient documentation

## 2020-01-18 DIAGNOSIS — Z90710 Acquired absence of both cervix and uterus: Secondary | ICD-10-CM | POA: Insufficient documentation

## 2020-01-18 DIAGNOSIS — Z803 Family history of malignant neoplasm of breast: Secondary | ICD-10-CM | POA: Insufficient documentation

## 2020-01-18 NOTE — Progress Notes (Signed)
Stable at discharge 

## 2020-01-18 NOTE — Progress Notes (Signed)
Patient denies any concerns today.  

## 2020-01-22 ENCOUNTER — Other Ambulatory Visit: Payer: Self-pay | Admitting: Family Medicine

## 2020-02-12 ENCOUNTER — Other Ambulatory Visit: Payer: Self-pay | Admitting: Family Medicine

## 2020-02-12 DIAGNOSIS — E559 Vitamin D deficiency, unspecified: Secondary | ICD-10-CM

## 2020-02-12 NOTE — Telephone Encounter (Signed)
  Requested medication (s) are on the active medication list: yes  Last refill:  12/22/2019  Future visit scheduled: yes  Notes to clinic: this refill cannot be delegated    Requested Prescriptions  Pending Prescriptions Disp Refills   Vitamin D, Ergocalciferol, (DRISDOL) 1.25 MG (50000 UNIT) CAPS capsule [Pharmacy Med Name: VITAMIN D2 50,000IU (ERGO) CAP RX] 24 capsule 1    Sig: TAKE 1 CAPSULE BY MOUTH 2 TIMES A WEEK      Endocrinology:  Vitamins - Vitamin D Supplementation Failed - 02/12/2020  6:20 AM      Failed - 50,000 IU strengths are not delegated      Failed - Phosphate in normal range and within 360 days    No results found for: PHOS        Failed - Vitamin D in normal range and within 360 days    Vit D, 25-Hydroxy  Date Value Ref Range Status  09/05/2018 47.4 30.0 - 100.0 ng/mL Final    Comment:    Vitamin D deficiency has been defined by the Institute of Medicine and an Endocrine Society practice guideline as a level of serum 25-OH vitamin D less than 20 ng/mL (1,2). The Endocrine Society went on to further define vitamin D insufficiency as a level between 21 and 29 ng/mL (2). 1. IOM (Institute of Medicine). 2010. Dietary reference    intakes for calcium and D. [place]: The    [employer]. 2. Holick MF, Binkley Bellwood, Bischoff-Ferrari HA, et al.    Evaluation, treatment, and prevention of vitamin D    deficiency: an Endocrine Society clinical practice    guideline. JCEM. 2011 Jul; 96(7):1911-30.           Passed - Ca in normal range and within 360 days    Calcium  Date Value Ref Range Status  05/12/2019 10.1 8.7 - 10.2 mg/dL Final   Calcium, Ion  Date Value Ref Range Status  09/03/2015 5.5 4.5 - 5.6 mg/dL Final          Passed - Valid encounter within last 12 months    Recent Outpatient Visits           2 months ago Type 2 diabetes mellitus with microalbuminuria, with long-term current use of insulin [facility])   [place] Steele Sizer, MD   3 months ago Urinary frequency   [facility] Towanda Malkin, MD   4 months ago Well adult exam   [facility] Steele Sizer, MD   7 months ago Upper respiratory tract infection, unspecified type   [facility] Steele Sizer, MD   9 months ago Type 2 diabetes mellitus with microalbuminuria, with long-term current use of insulin [facility])   [facility] Steele Sizer, MD       Future Appointments             In 2 weeks Steele Sizer, MD [facility], [facility]

## 2020-02-14 ENCOUNTER — Other Ambulatory Visit
Admission: RE | Admit: 2020-02-14 | Discharge: 2020-02-14 | Disposition: A | Discharge status: Home / Self Care | Payer: Managed Care, Other (non HMO) | Source: Ambulatory Visit | Attending: Gastroenterology | Admitting: Gastroenterology

## 2020-02-14 ENCOUNTER — Other Ambulatory Visit: Payer: Self-pay

## 2020-02-14 DIAGNOSIS — Z20822 Contact with and (suspected) exposure to covid-19: Secondary | ICD-10-CM | POA: Diagnosis not present

## 2020-02-14 DIAGNOSIS — Z01812 Encounter for preprocedural laboratory examination: Secondary | ICD-10-CM | POA: Diagnosis not present

## 2020-02-14 LAB — SARS CORONAVIRUS 2 (TAT 6-24 HRS): SARS Coronavirus 2: NEGATIVE

## 2020-02-16 ENCOUNTER — Encounter
Admission: RE | Disposition: A | Discharge status: Home / Self Care | Payer: Self-pay | Source: Home / Self Care | Attending: Gastroenterology

## 2020-02-16 ENCOUNTER — Ambulatory Visit: Payer: Managed Care, Other (non HMO) | Admitting: Certified Registered Nurse Anesthetist

## 2020-02-16 ENCOUNTER — Other Ambulatory Visit: Payer: Self-pay

## 2020-02-16 ENCOUNTER — Encounter: Payer: Self-pay | Admitting: Gastroenterology

## 2020-02-16 ENCOUNTER — Ambulatory Visit
Admission: RE | Admit: 2020-02-16 | Discharge: 2020-02-16 | Disposition: A | Discharge status: Home / Self Care | Payer: Managed Care, Other (non HMO) | Attending: Gastroenterology | Admitting: Gastroenterology

## 2020-02-16 DIAGNOSIS — Z7984 Long term (current) use of oral hypoglycemic drugs: Secondary | ICD-10-CM | POA: Diagnosis not present

## 2020-02-16 DIAGNOSIS — Z7982 Long term (current) use of aspirin: Secondary | ICD-10-CM | POA: Insufficient documentation

## 2020-02-16 DIAGNOSIS — D123 Benign neoplasm of transverse colon: Secondary | ICD-10-CM | POA: Insufficient documentation

## 2020-02-16 DIAGNOSIS — K635 Polyp of colon: Secondary | ICD-10-CM | POA: Diagnosis not present

## 2020-02-16 DIAGNOSIS — Z79890 Hormone replacement therapy (postmenopausal): Secondary | ICD-10-CM | POA: Insufficient documentation

## 2020-02-16 DIAGNOSIS — D1779 Benign lipomatous neoplasm of other sites: Secondary | ICD-10-CM | POA: Insufficient documentation

## 2020-02-16 DIAGNOSIS — Z1211 Encounter for screening for malignant neoplasm of colon: Secondary | ICD-10-CM | POA: Diagnosis not present

## 2020-02-16 DIAGNOSIS — Z87891 Personal history of nicotine dependence: Secondary | ICD-10-CM | POA: Insufficient documentation

## 2020-02-16 DIAGNOSIS — Z86010 Personal history of colonic polyps: Secondary | ICD-10-CM | POA: Insufficient documentation

## 2020-02-16 DIAGNOSIS — K6289 Other specified diseases of anus and rectum: Secondary | ICD-10-CM | POA: Diagnosis not present

## 2020-02-16 DIAGNOSIS — D125 Benign neoplasm of sigmoid colon: Secondary | ICD-10-CM | POA: Diagnosis not present

## 2020-02-16 DIAGNOSIS — Z79899 Other long term (current) drug therapy: Secondary | ICD-10-CM | POA: Insufficient documentation

## 2020-02-16 HISTORY — PX: COLONOSCOPY WITH PROPOFOL: SHX5780

## 2020-02-16 LAB — GLUCOSE, CAPILLARY: Glucose-Capillary: 119 mg/dL — ABNORMAL HIGH (ref 70–99)

## 2020-02-16 SURGERY — COLONOSCOPY WITH PROPOFOL
Anesthesia: General

## 2020-02-16 MED ORDER — PROPOFOL 10 MG/ML IV BOLUS
INTRAVENOUS | Status: DC | PRN
  Administered 2020-02-16: 30 mg via INTRAVENOUS
  Administered 2020-02-16 (×2): 50 mg via INTRAVENOUS

## 2020-02-16 MED ORDER — LIDOCAINE HCL (PF) 2 % IJ SOLN
INTRAMUSCULAR | Status: AC
  Filled 2020-02-16: qty 10

## 2020-02-16 MED ORDER — LIDOCAINE HCL (CARDIAC) PF 100 MG/5ML IV SOSY
PREFILLED_SYRINGE | INTRAVENOUS | Status: DC | PRN
  Administered 2020-02-16: 100 mg via INTRAVENOUS

## 2020-02-16 MED ORDER — SODIUM CHLORIDE 0.9 % IV SOLN
INTRAVENOUS | Status: DC
  Administered 2020-02-16: 1000 mL via INTRAVENOUS

## 2020-02-16 MED ORDER — PROPOFOL 500 MG/50ML IV EMUL
INTRAVENOUS | Status: DC | PRN
  Administered 2020-02-16: 50 ug/kg/min via INTRAVENOUS

## 2020-02-16 MED ORDER — PROPOFOL 10 MG/ML IV BOLUS
INTRAVENOUS | Status: AC
  Filled 2020-02-16: qty 80

## 2020-02-16 NOTE — Transfer of Care (Signed)
Immediate Anesthesia Transfer of Care Note  Patient: Tabitha Shaw  Procedure(s) Performed: COLONOSCOPY WITH PROPOFOL (N/A )  Patient Location: PACU and Endoscopy Unit  Anesthesia Type:General  Level of Consciousness: awake, drowsy and patient cooperative  Airway & Oxygen Therapy: Patient Spontanous Breathing  Post-op Assessment: Report given to RN and Post -op Vital signs reviewed and stable  Post vital signs: Reviewed and stable  Last Vitals:  Vitals Value Taken Time  BP 106/72 02/16/20 1036  Temp    Pulse 86 02/16/20 1037  Resp 20 02/16/20 1037  SpO2 99 % 02/16/20 1037  Vitals shown include unvalidated device data.  Last Pain:  Vitals:   02/16/20 0900  TempSrc: Temporal  PainSc: 0-No pain         Complications: No complications documented.

## 2020-02-16 NOTE — Op Note (Signed)
[facility] Gastroenterology Patient Name: Tabitha Shaw Procedure Date: 02/16/2020 9:46 AM MRN: [id number] Account #: [id number] Date of Birth: 06-18-1960 Admit Type: Outpatient Age: 60 Room: [facility] ENDO ROOM 2 Gender: Female Note Status: Finalized Procedure:             Colonoscopy Indications:           High risk colon cancer surveillance: Personal history                         of colonic polyps Providers:             Maira Christon B. Bonna Gains MD, MD Referring MD:          Bethena Roys. Sowles, MD (Referring MD) Medicines:             Monitored Anesthesia Care Complications:         No immediate complications. Procedure:             Pre-Anesthesia Assessment:                        - ASA Grade Assessment: II - A patient with mild                         systemic disease.                        - Prior to the procedure, a History and Physical was                         performed, and patient medications, allergies and                         sensitivities were reviewed. The patient's tolerance                         of previous anesthesia was reviewed.                        - The risks and benefits of the procedure and the                         sedation options and risks were discussed with the                         patient. All questions were answered and informed                         consent was obtained.                        - Patient identification and proposed procedure were                         verified prior to the procedure by the physician, the                         nurse, the anesthesiologist, the anesthetist and the                         technician. The  procedure was verified in the                         procedure room.                        After obtaining informed consent, the colonoscope was                         passed under direct vision. Throughout the procedure,                         the patient's blood pressure, pulse,  and oxygen                         saturations were monitored continuously. The                         Colonoscope was introduced through the anus and                         advanced to the the cecum, identified by appendiceal                         orifice and ileocecal valve. The colonoscopy was                         performed with ease. The patient tolerated the                         procedure well. The quality of the bowel preparation                         was good. Findings:      The perianal and digital rectal examinations were normal.      Two sessile polyps were found in the sigmoid colon and hepatic flexure.       The polyps were 4 to 5 mm in size. These polyps were removed with a cold       snare. Resection and retrieval were complete.      There was a medium-sized lipoma, in the ascending colon. Biopsies were       done on previous procedure      The exam was otherwise without abnormality.      The rectum, sigmoid colon, descending colon, transverse colon, ascending       colon and cecum appeared normal.      Anal papilla(e) were hypertrophied.      No additional abnormalities were found on retroflexion. Impression:            - Two 4 to 5 mm polyps in the sigmoid colon and at the                         hepatic flexure, removed with a cold snare. Resected                         and retrieved.                        - Medium-sized lipoma in the ascending colon.                        -  The examination was otherwise normal.                        - The rectum, sigmoid colon, descending colon,                         transverse colon, ascending colon and cecum are normal.                        - Anal papilla(e) were hypertrophied. Recommendation:        - Discharge patient to home (with escort).                        - Advance diet as tolerated.                        - Continue present medications.                        - Await pathology results.                         - Repeat colonoscopy date to be determined after                         pending pathology results are reviewed.                        - The findings and recommendations were discussed with                         the patient.                        - The findings and recommendations were discussed with                         the patient's family.                        - Return to primary care physician as previously                         scheduled. Procedure Code(s):     --- Professional ---                        [phone], Colonoscopy, flexible; with removal of                         tumor(s), polyp(s), or other lesion(s) by snare                         technique Diagnosis Code(s):     --- Professional ---                        Z86.010, Personal history of colonic polyps                        K63.5, Polyp of colon CPT copyright 2019 American Medical Association. All rights reserved. The codes documented in this report are preliminary  and upon coder review may  be revised to meet current compliance requirements.  Vonda Antigua, MD Margretta Sidle B. Bonna Gains MD, MD 02/16/2020 10:49:56 AM This report has been signed electronically. Number of Addenda: 0 Note Initiated On: 02/16/2020 9:46 AM Scope Withdrawal Time: 0 hours 16 minutes 26 seconds  Estimated Blood Loss:  Estimated blood loss: none.      [facility]

## 2020-02-16 NOTE — Anesthesia Preprocedure Evaluation (Signed)
Anesthesia Evaluation  Patient identified by MRN, date of birth, ID band Patient awake    Reviewed: Allergy & Precautions, H&P , NPO status , Patient's Chart, lab work & pertinent test results  History of Anesthesia Complications Negative for: history of anesthetic complications  Airway Mallampati: III  TM Distance: >3 FB     Dental  (+) Teeth Intact, Dental Advidsory Given   Pulmonary neg pulmonary ROS, neg shortness of breath, neg COPD, neg recent URI, former smoker,           Cardiovascular Exercise Tolerance: Good hypertension, (-) angina(-) Past MI and (-) Cardiac Stents (-) dysrhythmias (-) Valvular Problems/Murmurs     Neuro/Psych negative neurological ROS  negative psych ROS   GI/Hepatic negative GI ROS, Neg liver ROS,   Endo/Other  diabetesHypothyroidism Morbid obesity  Renal/GU Renal disease (hypertrophy of kidney)  negative genitourinary   Musculoskeletal   Abdominal   Peds  Hematology negative hematology ROS (+)   Anesthesia Other Findings Past Medical History: No date: Acquired acanthosis nigricans No date: Diabetes type 2, controlled ([place])     Comment:  if controlled was not specified No date: Hemochromatosis No date: Hepatomegaly No date: HLD (hyperlipidemia) No date: HTN (hypertension) No date: Hyperlipidemia No date: Hypertension No date: Hypertrophy of kidney No date: Hypothyroidism No date: Intertrigo No date: Obesity No date: Osteopenia No date: Postinflammatory hyperpigmentation No date: Snoring No date: Thyroid disease No date: Vitamin D deficiency  Past Surgical History: 2005: ABDOMINAL HYSTERECTOMY No date: blood transfusion yearly No date: BREAST REDUCTION SURGERY No date: CESAREAN SECTION 1992: CESAREAN SECTION 2000: CHOLECYSTECTOMY No date: lapcholecystectomy 11/26/2017: PARATHYROIDECTOMY; Right     Comment:  right lower , Dr. Maudie Mercury at [place]: [place];  Bilateral No date: VESICOVAGINAL FISTULA CLOSURE W/ TAH     Reproductive/Obstetrics negative OB ROS                             Anesthesia Physical  Anesthesia Plan  ASA: III  Anesthesia Plan: General   Post-op Pain Management:    Induction: Intravenous  PONV Risk Score and Plan: Propofol infusion and TIVA  Airway Management Planned: Natural Airway and Nasal Cannula  Additional Equipment:   Intra-op Plan:   Post-operative Plan:   Informed Consent: I have reviewed the patients History and Physical, chart, labs and discussed the procedure including the risks, benefits and alternatives for the proposed anesthesia with the patient or authorized representative who has indicated his/her understanding and acceptance.     Dental Advisory Given  Plan Discussed with: Anesthesiologist, CRNA and Surgeon  Anesthesia Plan Comments:         Anesthesia Quick Evaluation

## 2020-02-16 NOTE — Anesthesia Postprocedure Evaluation (Signed)
Anesthesia Post Note  Patient: Tabitha Shaw  Procedure(s) Performed: COLONOSCOPY WITH PROPOFOL (N/A )  Patient location during evaluation: Endoscopy Anesthesia Type: General Level of consciousness: awake and alert Pain management: pain level controlled Vital Signs Assessment: post-procedure vital signs reviewed and stable Respiratory status: spontaneous breathing, nonlabored ventilation, respiratory function stable and patient connected to nasal cannula oxygen Cardiovascular status: blood pressure returned to baseline and stable Postop Assessment: no apparent nausea or vomiting Anesthetic complications: no   No complications documented.   Last Vitals:  Vitals:   02/16/20 1037 02/16/20 1107  BP:  105/69  Pulse:    Resp:    Temp: (!) 35.8 C   SpO2:      Last Pain:  Vitals:   02/16/20 1107  TempSrc:   PainSc: 0-No pain                 Martha Clan

## 2020-02-16 NOTE — H&P (Signed)
Vonda Antigua, MD [address] [place], [place], [place], [address] [address], [place], [place], [place], [address] Phone: [phone]  Fax: [phone]  Primary Care Physician:  Steele Sizer, MD   Pre-Procedure History & Physical: HPI:  Tabitha Shaw is a 60 y.o. female is here for a colonoscopy.   Past Medical History:  Diagnosis Date  . Acquired acanthosis nigricans   . Diabetes type 2, controlled ([place])    if controlled was not specified  . Hemochromatosis   . Hepatomegaly   . HLD (hyperlipidemia)   . HTN (hypertension)   . Hyperlipidemia   . Hypertension   . Hypertrophy of kidney   . Hypothyroidism   . Intertrigo   . Obesity   . Osteopenia   . Postinflammatory hyperpigmentation   . Snoring   . Thyroid disease   . Vitamin D deficiency     Past Surgical History:  Procedure Laterality Date  . ABDOMINAL HYSTERECTOMY  2005  . blood transfusion yearly    . BREAST REDUCTION SURGERY    . CESAREAN SECTION    . CESAREAN SECTION  1992  . CHOLECYSTECTOMY  2000  . COLONOSCOPY WITH PROPOFOL N/A 11/11/2018   Procedure: COLONOSCOPY WITH PROPOFOL;  Surgeon: Virgel Manifold, MD;  Location: [facility] ENDOSCOPY;  Service: Endoscopy;  Laterality: N/A;  . lapcholecystectomy    . PARATHYROIDECTOMY Right 11/26/2017   right lower , Dr. Maudie Mercury at [facility]  . REDUCTION MAMMAPLASTY Bilateral 1985  . VESICOVAGINAL FISTULA CLOSURE W/ TAH      Prior to Admission medications   Medication Sig Start Date End Date Taking? Authorizing Provider  amLODipine-benazepril (LOTREL) 5-20 MG capsule Take 1 capsule by mouth daily. 11/22/19  Yes Steele Sizer, MD  aspirin 81 MG EC tablet Take 81 mg by mouth daily.   Yes [provider]  blood glucose meter kit and supplies Dispense based on patient and insurance preference. Use up to four times daily as directed. (FOR ICD-10 E10.9, E11.9). 12/09/18  Yes Sowles, Drue Stager, MD  fluticasone (FLONASE) 50 MCG/ACT nasal spray SHAKE  LIQUID AND USE 2 SPRAYS IN EACH NOSTRIL DAILY 09/18/19  Yes Sowles, Drue Stager, MD  levothyroxine (SYNTHROID) 75 MCG tablet TAKE 1 TABLET BY MOUTH DAILY 08/31/19  Yes Sowles, Drue Stager, MD  metFORMIN (GLUCOPHAGE-XR) 750 MG 24 hr tablet Take 1 tablet (750 mg total) by mouth daily with breakfast. 11/22/19  Yes Sowles, Drue Stager, MD  ONE TOUCH ULTRA TEST test strip CHECK FASTING BLOOD SUGAR  TWICE WEEKLY 12/25/17  Yes Sowles, Drue Stager, MD  OZEMPIC, 1 MG/DOSE, 4 MG/3ML SOPN INJECT 1 MG UNDER THE SKIN ONCE A WEEK 01/22/20  Yes Sowles, Drue Stager, MD  rosuvastatin (CRESTOR) 40 MG tablet TAKE 1 TABLET BY MOUTH DAILY 08/31/19  Yes Steele Sizer, MD  SUPREP BOWEL PREP KIT 17.5-3.13-1.6 GM/177ML SOLN Take by mouth as directed. 11/04/19  Yes [provider]  Vitamin D, Ergocalciferol, (DRISDOL) 1.25 MG (50000 UNIT) CAPS capsule TAKE 1 CAPSULE BY MOUTH 2 TIMES A WEEK 02/12/20  Yes Steele Sizer, MD    Allergies as of 12/28/2019  . (No Known Allergies)    Family History  Problem Relation Age of Onset  . Colon polyps Mother   . Hemachromatosis Mother   . Hypertension Mother   . Depression Sister   . Hypertension Brother   . Colon polyps Brother   . Cancer Father   . Breast cancer Cousin   . Breast cancer Cousin     Social History   Socioeconomic  History  . Marital status: Married    Spouse name: [name]   . Number of children: 1  . Years of education: Not on file  . Highest education level: Some college, no degree  Occupational History  . Occupation: customer services     Comment: [employer]   Tobacco Use  . Smoking status: Former Smoker    Packs/day: 0.25    Years: 8.00    Pack years: 2.00    Types: Cigarettes    Start date: 01/19/1982    Quit date: 01/19/1990    Years since quitting: 30.0  . Smokeless tobacco: Never Used  Vaping Use  . Vaping Use: Never used  Substance and Sexual Activity  . Alcohol use: Yes    Alcohol/week: 0.0 standard drinks    Comment: occasionally  . Drug use: No  .  Sexual activity: Yes    Partners: Male    Birth control/protection: Surgical  Other Topics Concern  . Not on file  Social History Narrative   Married; works  full time   Her daughter, [name] is a travel Marine scientist    Social Determinants of Radio broadcast assistant Strain: [place]   . Difficulty of Paying Living Expenses: Not hard at all  Food Insecurity: No Food Insecurity  . Worried About Charity fundraiser in the Last Year: Never true  . Ran Out of Food in the Last Year: Never true  Transportation Needs: No Transportation Needs  . Lack of Transportation (Medical): No  . Lack of Transportation (Non-Medical): No  Physical Activity: Insufficiently Active  . Days of Exercise per Week: 3 days  . Minutes of Exercise per Session: 30 min  Stress: No Stress Concern Present  . Feeling of Stress : Only a little  Social Connections: Socially Integrated  . Frequency of Communication with Friends and Family: Twice a week  . Frequency of Social Gatherings with Friends and Family: Once a week  . Attends Religious Services: 1 to 4 times per year  . Active Member of Clubs or Organizations: Yes  . Attends Archivist Meetings: 1 to 4 times per year  . Marital Status: Married  Human resources officer Violence: Not At Risk  . Fear of Current or Ex-Partner: No  . Emotionally Abused: No  . Physically Abused: No  . Sexually Abused: No    Review of Systems: See HPI, otherwise negative ROS  Physical Exam: BP (!) 138/100   Pulse 85   Temp (!) 96.4 F (35.8 C) (Temporal)   Resp 18   Ht 5' 7"  (1.702 m)   Wt 117.9 kg   SpO2 99%   BMI 40.72 kg/m  General:   Alert,  pleasant and cooperative in NAD Head:  Normocephalic and atraumatic. Neck:  Supple; no masses or thyromegaly. Lungs:  Clear throughout to auscultation, normal respiratory effort.    Heart:  +S1, +S2, Regular rate and rhythm, No edema. Abdomen:  Soft, nontender and nondistended. Normal bowel sounds, without guarding, and  without rebound.   Neurologic:  Alert and  oriented x4;  grossly normal neurologically.  Impression/Plan: Tabitha Shaw is here for a colonoscopy to be history of polyps  Risks, benefits, limitations, and alternatives regarding  colonoscopy have been reviewed with the patient.  Questions have been answered.  All parties agreeable.   Virgel Manifold, MD  02/16/2020, 9:51 AM

## 2020-02-17 NOTE — Progress Notes (Signed)
Non-identified Voicemail.  No Message left. ?

## 2020-02-19 ENCOUNTER — Encounter: Payer: Self-pay | Admitting: Gastroenterology

## 2020-02-19 LAB — SURGICAL PATHOLOGY

## 2020-02-20 ENCOUNTER — Encounter: Payer: Self-pay | Admitting: Gastroenterology

## 2020-02-26 NOTE — Progress Notes (Signed)
Name: Tabitha Shaw   MRN: [id number]    DOB: May 09, 1960   Date:02/28/2020       Progress Note  Subjective  Chief Complaint  Follow up   HPI   DMII :hgbA1Cspiked to 8.1% back in August 2020 down 6 % up to 6.7 % today is down again to 6%  She states glucose at home has been 110-129  in the [facility] compliant  Ozempic and Metformin. She states she is being more mindful with her diet, , avoiding a lot of bagels . Denies polyphagia, polydipsia or polyuria, but she has nocturia once per night - stable. She has been taking benazepril for microalbuminuria. LastLDL down from  162  to 76 , she has been doing better with her diet, taking statin therapy  HTN: taking medication daily, denies  chest pain, palpitationordizziness.Continue Lotrel 5/20.  Hyperlipidemia: she was taking Atorvastatin but LDL was high at 162 , so we switched to Crestor in 2020 Crestor seems to be working better with LDL down to 76, we discussed adding Zetia but she would like to hold off for now , we will recheck labs   Hypothyroidism: she is taking Synthroid4mgdaily , no change in bowel movement of dry skin, denies dysphagia , positive for weight loss   Morbid obesity::she continues to lose weight, she states working from home, but takes breaks every hour to do something around the house, she is taking Ozempic and Metformin, she states it has been too cold to walk outside, but she tries to move inside her house .  She has been drinking more water, she still working from home and is eating out less often, she is now cutting down on carbs again   PrimaryHyperparathyroidism: seen by Dr. SGabriel Carinaand referred to Dr. KMaudie Mercury found to have a right lower parathyroid enlargement and had surgery on 11/26/2017,and has been released since, last Pth was normal. Denies cramps, GERD symptoms. Unchanged   Hemochromatosis : sees Dr FGrayland Ormondand has phlebotomy. Last treatment was 12/2019 ,removal of 500 ml.  Memory  difficulties: she is worried because daughter noticed she has been forgetful, there is a family history of Alzheimer's dementia, and her 23&Me test was positive. Discussed MMS, signs and symptoms of Alzheimer's , she has been working on puzzles every night. Discussed referral neurologist   CIN1: she was seen by Dr. HKenton Kingfisherand is going back in April for another pap smear, she had a biopsy and will go back soon for follow up  Patient Active Problem List   Diagnosis Date Noted  . Dysplasia of cervix, low grade (CIN 1) 11/22/2019  . ASCUS with positive high risk HPV cervical 10/24/2019  . Personal history of colonic polyps   . Polyp of colon   . Cervical high risk HPV (human papillomavirus) test positive 09/03/2016  . B12 deficiency 09/04/2015  . Elevated PTHrP level 09/04/2015  . Benign hypertension 09/12/2014  . Acanthosis nigricans 09/09/2014  . Diabetes mellitus with renal manifestation ([place] 09/09/2014  . Dyslipidemia 09/09/2014  . Abnormal electrocardiogram 09/09/2014  . Enlarged kidney 09/09/2014  . 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[place] 09/09/2014  . Enlarged liver 09/09/2014  . Calcium blood increased 09/09/2014  . Adult hypothyroidism 09/09/2014  . Microalbuminuria 09/09/2014  . Morbid obesity due to excess calories ([place] 09/09/2014  . Spinal stenosis of lumbar region with neurogenic claudication 09/09/2014  . Osteopenia 09/09/2014  . Vitamin D deficiency 09/09/2014    Past Surgical History:  Procedure Laterality Date  . ABDOMINAL HYSTERECTOMY  2005  .  blood transfusion yearly    . BREAST REDUCTION SURGERY    . CESAREAN SECTION    . CESAREAN SECTION  1992  . CHOLECYSTECTOMY  2000  . COLONOSCOPY WITH PROPOFOL N/A 11/11/2018   Procedure: COLONOSCOPY WITH PROPOFOL;  Surgeon: Virgel Manifold, MD;  Location: [facility] ENDOSCOPY;  Service: Endoscopy;  Laterality: N/A;  . COLONOSCOPY WITH PROPOFOL N/A 02/16/2020   Procedure: COLONOSCOPY WITH PROPOFOL;  Surgeon: Virgel Manifold,  MD;  Location: [facility] ENDOSCOPY;  Service: Endoscopy;  Laterality: N/A;  . lapcholecystectomy    . PARATHYROIDECTOMY Right 11/26/2017   right lower , [facility]audie [facility] at [facility]  . REDUCTION MAMMAPLASTY Bilateral 1985  . VESICOVAGINAL FISTULA CLOSURE W/ TAH      Family History  Problem Relation Age of Onset  . Colon polyps Mother   . Hemachromatosis Mother   . Hypertension Mother   . Depression Sister   . Hypertension Brother   . Colon polyps Brother   . Cancer Father   . Breast cancer Cousin   . Breast cancer Cousin     Social History   Tobacco Use  . Smoking status: Former Smoker    Packs/day: 0.25    Years: 8.00    Pack years: 2.00    Types: Cigarettes    Start date: 01/19/1982    Quit date: 01/19/1990    Years since quitting: 30.1  . Smokeless tobacco: Never Used  Substance Use Topics  . Alcohol use: Yes    Alcohol/week: 0.0 standard drinks    Comment: occasionally     Current Outpatient Medications:  .  amLODipine-benazepril (LOTREL) 5-20 MG capsule, Take 1 capsule by mouth daily., Disp: 90 capsule, Rfl: 0 .  aspirin 81 MG EC tablet, Take 81 mg by mouth daily., Disp: , Rfl:  .  benzonatate (TESSALON) 100 MG capsule, benzonatate 100 mg capsule, Disp: , Rfl:  .  blood glucose meter kit and supplies, Dispense based on patient and insurance preference. Use up to four times daily as directed. (FOR ICD-10 E10.9, E11.9)., Disp: 1 each, Rfl: 0 .  fluticasone (FLONASE) 50 MCG/ACT nasal spray, SHAKE LIQUID AND USE 2 SPRAYS IN EACH NOSTRIL DAILY, Disp: 48 g, Rfl: 0 .  levothyroxine (SYNTHROID) 75 MCG tablet, TAKE 1 TABLET BY MOUTH DAILY, Disp: 90 tablet, Rfl: 2 .  meloxicam (MOBIC) 7.5 MG tablet, Take 7.5 mg by mouth 2 (two) times daily., Disp: , Rfl:  .  metFORMIN (GLUCOPHAGE-XR) 750 MG 24 hr tablet, Take 1 tablet (750 mg total) by mouth daily with breakfast., Disp: 90 tablet, Rfl: 3 .  ONE TOUCH ULTRA TEST test strip, CHECK FASTING BLOOD SUGAR  TWICE WEEKLY, Disp: 100 each, Rfl: 1 .   OZEMPIC, 1 MG/DOSE, 4 MG/3ML SOPN, INJECT 1 MG UNDER THE SKIN ONCE A WEEK, Disp: 9 mL, Rfl: 0 .  rosuvastatin (CRESTOR) 40 MG tablet, TAKE 1 TABLET BY MOUTH DAILY, Disp: 90 tablet, Rfl: 2 .  sulfamethoxazole-trimethoprim (BACTRIM DS) 800-160 MG tablet, sulfamethoxazole 800 mg-trimethoprim 160 mg tablet  TAKE 1 TABLET BY MOUTH TWICE DAILY FOR 5 DAYS, Disp: , Rfl:  .  Vitamin D, Ergocalciferol, (DRISDOL) 1.25 MG (50000 UNIT) CAPS capsule, TAKE 1 CAPSULE BY MOUTH 2 TIMES A WEEK, Disp: 24 capsule, Rfl: 1 .  ketoconazole (NIZORAL) 2 % shampoo, ketoconazole 2 % shampoo  APPLY EXTERNALLY 3 TIMES A WEEK (Patient not taking: Reported on 02/28/2020), Disp: , Rfl:   No Known Allergies  I personally reviewed active problem list, medication list, allergies, family history,  social history, health maintenance with the patient/caregiver today.   ROS  Constitutional: Negative for fever , positive for weight change, lost 6 lbs .  Respiratory: Negative for cough and shortness of breath.   Cardiovascular: Negative for chest pain or palpitations.  Gastrointestinal: Negative for abdominal pain, no bowel changes.  Musculoskeletal: Negative for gait problem or joint swelling.  Skin: Negative for rash.  Neurological: Negative for dizziness or headache.  No other specific complaints in a complete review of systems (except as listed in HPI above).  Objective  Vitals:   02/28/20 0738  BP: 122/84  Pulse: 92  Resp: 16  Temp: 98.1 F (36.7 C)  TempSrc: Oral  SpO2: 99%  Weight: 251 lb (113.9 kg)  Height: 5' 6"  (1.676 m)    Body mass index is 40.51 kg/m.  Physical Exam  Constitutional: Patient appears well-developed and well-nourished. Obese  No distress.  HEENT: head atraumatic, normocephalic, pupils equal and reactive to light,  neck supple Cardiovascular: Normal rate, regular rhythm and normal heart sounds.  No murmur heard. No BLE edema. Pulmonary/Chest: Effort normal and breath sounds normal. No  respiratory distress. Abdominal: Soft.  There is no tenderness. Psychiatric: Patient has a normal mood and affect. behavior is normal. Judgment and thought content normal.  Recent Results (from the past 2160 hour(s))  SARS CORONAVIRUS 2 (TAT 6-24 HRS) Nasopharyngeal Nasopharyngeal Swab     Status: None   Collection Time: 02/14/20  9:13 AM   Specimen: Nasopharyngeal Swab  Result Value Ref Range   SARS Coronavirus 2 NEGATIVE NEGATIVE    Comment: (NOTE) SARS-CoV-2 target nucleic acids are NOT DETECTED.  The SARS-CoV-2 RNA is generally detectable in upper and lower respiratory specimens during the acute phase of infection. Negative results do not preclude SARS-CoV-2 infection, do not rule out co-infections with other pathogens, and should not be used as the sole basis for treatment or other patient management decisions. Negative results must be combined with clinical observations, patient history, and epidemiological information. The expected result is Negative.  Fact Sheet for Patients: SugarRoll.be  Fact Sheet for Healthcare Providers: https://www.woods-mathews.com/  This test is not yet approved or cleared by the [place] FDA and  has been authorized for detection and/or diagnosis of SARS-CoV-2 by FDA under an Emergency Use Authorization (EUA). This EUA will remain  in effect (meaning this test can be used) for the duration of the COVID-19 declaration under Se ction 564(b)(1) of the Act, 21 U.S.C. section 360bbb-3(b)(1), unless the authorization is terminated or revoked sooner.  Performed at [facility], [place] [address] [place], [place] [address]   Glucose, capillary     Status: Abnormal   Collection Time: 02/16/20  8:46 AM  Result Value Ref Range   Glucose-Capillary 119 (H) 70 - 99 mg/dL    Comment: Glucose reference range applies only to samples taken after fasting for at least 8 hours.  Surgical pathology     Status:  None   Collection Time: 02/16/20 10:21 AM  Result Value Ref Range   SURGICAL PATHOLOGY      SURGICAL PATHOLOGY CASE: [phone] [phone] PATIENT: Delton Coombes Surgical Pathology Report     Specimen Submitted: A. Colon polyp x2, HF(1) sigmoid(1); cs  Clinical History: Personal history of colon polyps Z86.010, colon polyps      DIAGNOSIS: A. COLON POLYP X2, HEPATIC FLEXURE AND SIGMOID; COLD SNARE: - TUBULAR ADENOMA, TWO FRAGMENTS. - NEGATIVE FOR HIGH-GRADE DYSPLASIA AND MALIGNANCY.   GROSS DESCRIPTION: A. Labeled: Cold snare hepatic  flexure x1/sigmoid x1 colon polyp Received: Formalin Collection time: 10:21 AM on 02/16/2020 Placed into formalin time: 10:21 AM on 02/16/2020 Tissue fragment(s): 2 Size: Range from 0.2-1.1 cm Description: Received are 2 fragments of tan-pink soft tissue.  The larger fragment has a resection margin which is inked black.  This fragment is trisected. Entirely submitted in 1 cassette.   Final Diagnosis performed by Betsy Pries, MD.   Electronically signed 02/19/2020 8:26:28AM The electronic signature indicates that  the named Attending Pathologist has evaluated the specimen Technical component performed at [facility], [address] [place], [place] [address] Lab: [phone] Dir: Rush Farmer, MD, MMM  Professional component performed at [facility], [facility], [place], [place], [place] [address] Lab: [phone] Dir: Dellia Nims. Rubinas, MD   POCT HgB A1C     Status: Abnormal   Collection Time: 02/28/20  7:51 AM  Result Value Ref Range   Hemoglobin A1C 6.0 (A) 4.0 - 5.6 %   HbA1c POC (<> result, manual entry)     HbA1c, POC (prediabetic range)     HbA1c, POC (controlled diabetic range)      PHQ2/9: Depression screen [facility] 2/9 02/28/2020 11/22/2019 11/10/2019 09/19/2019 06/23/2019  Decreased Interest 0 0 0 0 0  Down, Depressed, Hopeless 0 0 0 0 0  PHQ - 2 Score 0 0 0 0 0  Altered sleeping - - - 0 0  Tired, decreased  energy - - - 0 0  Change in appetite - - - 0 0  Feeling bad or failure about yourself  - - - 0 0  Trouble concentrating - - - 0 0  Moving slowly or fidgety/restless - - - 0 0  Suicidal thoughts - - - 0 0  PHQ-9 Score - - - 0 0  Difficult doing work/chores - - - - Not difficult at all  Some recent data might be hidden    phq 9 is negative  Fall Risk: Fall Risk  02/28/2020 11/22/2019 11/10/2019 09/19/2019 06/23/2019  Falls in the past year? 0 0 0 0 0  Number falls in past yr: 0 0 0 0 0  Injury with Fall? 0 0 0 0 0     Functional Status Survey: Is the patient deaf or have difficulty hearing?: No Does the patient have difficulty seeing, even when wearing glasses/contacts?: No Does the patient have difficulty concentrating, remembering, or making decisions?: No Does the patient have difficulty walking or climbing stairs?: No Does the patient have difficulty dressing or bathing?: No Does the patient have difficulty doing errands alone such as visiting a doctor's office or shopping?: No    Assessment & Plan  1. Type 2 diabetes mellitus with microalbuminuria, with long-term current use of insulin (HCC)  - POCT HgB A1C - Semaglutide, 1 MG/DOSE, (OZEMPIC, 1 MG/DOSE,) 4 MG/3ML SOPN; Inject 1 mg into the skin once a week.  Dispense: 9 mL; Refill: 1 - Microalbumin / creatinine urine ratio - Hemoglobin A1c  2. Need for shingles vaccine  - Varicella-zoster vaccine IM (Shingrix)  3. Adult hypothyroidism  - TSH  4. Benign hypertension  - amLODipine-benazepril (LOTREL) 5-20 MG capsule; Take 1 capsule by mouth daily.  Dispense: 90 capsule; Refill: 1 - Comprehensive metabolic panel  5. B12 deficiency  - Vitamin B12  6. 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[place])  Under the care of Dr. Grayland Ormond   7. Vitamin D deficiency  - VITAMIN D 25 Hydroxy (Vit-D Deficiency, Fractures)  8. History of parathyroidectomy ([place])  - Parathyroid hormone, intact (  no Ca)  9. Morbid obesity with BMI of  40.0-44.9, adult ([place])  .Discussed with the patient the risk posed by an increased BMI. Discussed importance of portion control, calorie counting and at least 150 minutes of physical activity weekly. Avoid sweet beverages and drink more water. Eat at least 6 servings of fruit and vegetables daily   10. Dysplasia of cervix, low grade (CIN 1)   11. Memory difficulty  - Ambulatory referral to Neurology  12. Dermatitis, seborrheic  - ketoconazole (NIZORAL) 2 % shampoo; Apply topically 2 (two) times a week.  Dispense: 120 mL; Refill: 2 - Microalbumin / creatinine urine ratio  13. Dyslipidemia due to type 2 diabetes mellitus (HCC)  - Lipid panel

## 2020-02-28 ENCOUNTER — Encounter: Payer: Self-pay | Admitting: Family Medicine

## 2020-02-28 ENCOUNTER — Ambulatory Visit: Payer: Managed Care, Other (non HMO) | Admitting: Family Medicine

## 2020-02-28 ENCOUNTER — Other Ambulatory Visit: Payer: Self-pay

## 2020-02-28 VITALS — BP 122/84 | HR 92 | Temp 98.1°F | Resp 16 | Ht 66.0 in | Wt 251.0 lb

## 2020-02-28 DIAGNOSIS — E785 Hyperlipidemia, unspecified: Secondary | ICD-10-CM

## 2020-02-28 DIAGNOSIS — E039 Hypothyroidism, unspecified: Secondary | ICD-10-CM | POA: Diagnosis not present

## 2020-02-28 DIAGNOSIS — E538 Deficiency of other specified B group vitamins: Secondary | ICD-10-CM

## 2020-02-28 DIAGNOSIS — Z794 Long term (current) use of insulin: Secondary | ICD-10-CM

## 2020-02-28 DIAGNOSIS — R809 Proteinuria, unspecified: Secondary | ICD-10-CM | POA: Diagnosis not present

## 2020-02-28 DIAGNOSIS — Z23 Encounter for immunization: Secondary | ICD-10-CM

## 2020-02-28 DIAGNOSIS — E892 Postprocedural hypoparathyroidism: Secondary | ICD-10-CM

## 2020-02-28 DIAGNOSIS — E1169 Type 2 diabetes mellitus with other specified complication: Secondary | ICD-10-CM

## 2020-02-28 DIAGNOSIS — Z9089 Acquired absence of other organs: Secondary | ICD-10-CM

## 2020-02-28 DIAGNOSIS — E1129 Type 2 diabetes mellitus with other diabetic kidney complication: Secondary | ICD-10-CM

## 2020-02-28 DIAGNOSIS — N870 Mild cervical dysplasia: Secondary | ICD-10-CM

## 2020-02-28 DIAGNOSIS — L219 Seborrheic dermatitis, unspecified: Secondary | ICD-10-CM

## 2020-02-28 DIAGNOSIS — E559 Vitamin D deficiency, unspecified: Secondary | ICD-10-CM

## 2020-02-28 DIAGNOSIS — Z6841 Body Mass Index (BMI) 40.0 and over, adult: Secondary | ICD-10-CM

## 2020-02-28 DIAGNOSIS — I10 Essential (primary) hypertension: Secondary | ICD-10-CM | POA: Diagnosis not present

## 2020-02-28 DIAGNOSIS — R413 Other amnesia: Secondary | ICD-10-CM

## 2020-02-28 LAB — POCT GLYCOSYLATED HEMOGLOBIN (HGB A1C): Hemoglobin A1C: 6 % — AB (ref 4.0–5.6)

## 2020-02-28 MED ORDER — KETOCONAZOLE 2 % EX SHAM
MEDICATED_SHAMPOO | CUTANEOUS | 2 refills | Status: DC
Start: 2020-02-29 — End: 2021-04-03

## 2020-02-28 MED ORDER — OZEMPIC (1 MG/DOSE) 4 MG/3ML ~~LOC~~ SOPN: 1.0000 mg | PEN_INJECTOR | SUBCUTANEOUS | 1 refills | Status: DC

## 2020-02-28 MED ORDER — AMLODIPINE BESY-BENAZEPRIL HCL 5-20 MG PO CAPS: 1.0000 | ORAL_CAPSULE | Freq: Every day | ORAL | 1 refills | Status: DC

## 2020-04-23 ENCOUNTER — Encounter: Payer: Self-pay | Admitting: Obstetrics & Gynecology

## 2020-04-23 ENCOUNTER — Ambulatory Visit: Payer: Managed Care, Other (non HMO) | Admitting: Obstetrics & Gynecology

## 2020-04-23 ENCOUNTER — Other Ambulatory Visit: Payer: Self-pay

## 2020-04-23 VITALS — BP 120/80 | Ht 67.0 in | Wt 251.0 lb

## 2020-04-23 DIAGNOSIS — N870 Mild cervical dysplasia: Secondary | ICD-10-CM | POA: Diagnosis not present

## 2020-04-23 DIAGNOSIS — Z90711 Acquired absence of uterus with remaining cervical stump: Secondary | ICD-10-CM | POA: Diagnosis not present

## 2020-04-23 NOTE — Progress Notes (Signed)
HPI:  Patient is a 60 y.o. G1P1 presenting for follow up evaluation of abnormal PAP smear in the past.  Her last PAP was 8 months ago and was abnormal: ASCUS. She has had a prior colposcopy. Prior biopsies (if done) were CIN I 6 mos ago.  SHe has had prior [facility]Marland Kitchen  PMHx: She  has a past medical history of Acquired acanthosis nigricans, Diabetes type 2, controlled ([place]), Hemochromatosis, Hepatomegaly, HLD (hyperlipidemia), HTN (hypertension), Hyperlipidemia, Hypertension, Hypertrophy of kidney, Hypothyroidism, Intertrigo, Obesity, Osteopenia, Postinflammatory hyperpigmentation, Snoring, Thyroid disease, and Vitamin D deficiency. Also,  has a past surgical history that includes Breast reduction surgery; Cesarean section; lapcholecystectomy; Vesicovaginal fistula closure w/ TAH; blood transfusion yearly; Cesarean section (1992); Cholecystectomy (2000); Abdominal hysterectomy (2005); Reduction mammaplasty (Bilateral, 1985); Parathyroidectomy (Right, 11/26/2017); Colonoscopy with propofol (N/A, 11/11/2018); and Colonoscopy with propofol (N/A, 02/16/2020)., family history includes Breast cancer in her cousin and cousin; Cancer in her father; Colon polyps in her brother and mother; Depression in her sister; Hemachromatosis in her mother; Hypertension in her brother and mother.,  reports that she quit smoking about 30 years ago. Her smoking use included cigarettes. She started smoking about 38 years ago. She has a 2.00 pack-year smoking history. She has never used smokeless tobacco. She reports current alcohol use. She reports that she does not use drugs.  She has a current medication list which includes the following prescription(s): amlodipine-benazepril, aspirin, blood glucose meter kit and supplies, fluticasone, ketoconazole, levothyroxine, meloxicam, metformin, one touch ultra test, rosuvastatin, ozempic (1 mg/dose), and vitamin d (ergocalciferol). Also, has No Known Allergies.  Review of Systems  All other  systems reviewed and are negative.   Objective: BP 120/80   Ht 5' 7"  (1.702 m)   Wt 251 lb (113.9 kg)   BMI 39.31 kg/m  Filed Weights   04/23/20 1514  Weight: 251 lb (113.9 kg)   Body mass index is 39.31 kg/m.  Physical examination Physical Exam Constitutional:      General: She is not in acute distress.    Appearance: She is well-developed.  Genitourinary:     Bladder normal.     Right Labia: No rash or tenderness.    Left Labia: No tenderness or rash.    No vaginal erythema or bleeding.     No vaginal atrophy present.     Right Adnexa: not tender and no mass present.    Left Adnexa: not tender and no mass present.    No cervical motion tenderness, discharge, polyp or nabothian cyst.     Cervical exam comments: Narrow Cervical Os Bleeding on PAP scanty.     Uterus is absent.     Pelvic exam was performed with patient in the lithotomy position.  HENT:     Head: Normocephalic and atraumatic.     Nose: Nose normal.  Abdominal:     General: There is no distension.     Palpations: Abdomen is soft.     Tenderness: There is no abdominal tenderness.  Musculoskeletal:        General: Normal range of motion.  Neurological:     Mental Status: She is alert and oriented to person, place, and time.     Cranial Nerves: No cranial nerve deficit.  Skin:    General: Skin is warm and dry.  Psychiatric:        Attention and Perception: Attention normal.        Mood and Affect: Mood and affect normal.  Speech: Speech normal.        Behavior: Behavior normal.        Thought Content: Thought content normal.        Judgment: Judgment normal.     ASSESSMENT:  History of Cervical Dysplasia 1. Dysplasia of cervix, low grade (CIN 1) - Pap IG (Image Guided)  2. S/P laparoscopic supracervical hysterectomy  Plan:  1.  I discussed the grading system of pap smears and HPV high risk viral types.   2. Follow up PAP 6 months, vs intervention if high grade dysplasia  identified. 3. Treatment of persistantly abnormal PAP smears and cervical dysplasia, even mild, is discussed w pt today in detail, as well as the pros and cons of Cryo and LEEP procedures. Will consider and discuss after results.  A total of 20 minutes were spent face-to-face with the patient as well as preparation, review, communication, and documentation during this encounter.    Barnett Applebaum, MD, [facility], [facility] 04/23/2020  3:31 PM

## 2020-04-26 LAB — PAP IG (IMAGE GUIDED)

## 2020-05-15 ENCOUNTER — Other Ambulatory Visit: Payer: Self-pay | Admitting: Family Medicine

## 2020-05-15 DIAGNOSIS — E039 Hypothyroidism, unspecified: Secondary | ICD-10-CM

## 2020-05-15 NOTE — Telephone Encounter (Signed)
Requested Prescriptions  Pending Prescriptions Disp Refills  . rosuvastatin (CRESTOR) 40 MG tablet [Pharmacy Med Name: ROSUVASTATIN 40MG  TABLETS] 90 tablet 1    Sig: TAKE 1 TABLET BY MOUTH DAILY     Cardiovascular:  Antilipid - Statins Failed - 05/15/2020  6:22 AM      Failed - Total Cholesterol in normal range and within 360 days    Cholesterol, Total  Date Value Ref Range Status  05/12/2019 139 100 - 199 mg/dL Final         Failed - LDL in normal range and within 360 days    LDL Chol Calc (NIH)  Date Value Ref Range Status  05/12/2019 76 0 - 99 mg/dL Final         Failed - HDL in normal range and within 360 days    HDL  Date Value Ref Range Status  05/12/2019 42 >39 mg/dL Final         Failed - Triglycerides in normal range and within 360 days    Triglycerides  Date Value Ref Range Status  05/12/2019 115 0 - 149 mg/dL Final   Triglyceride fasting, serum  Date Value Ref Range Status  12/27/2008 202 mg/dL          Passed - Patient is not pregnant      Passed - Valid encounter within last 12 months    Recent Outpatient Visits          2 months ago Type 2 diabetes mellitus with microalbuminuria, with long-term current use of insulin [facility])   [facility], Drue Stager, MD   5 months ago Type 2 diabetes mellitus with microalbuminuria, with long-term current use of insulin [facility])   [facility] Steele Sizer, MD   6 months ago Urinary frequency   [facility] Towanda Malkin, MD   7 months ago Well adult exam   [facility] Steele Sizer, MD   10 months ago Upper respiratory tract infection, unspecified type   [facility] Steele Sizer, MD      Future Appointments            In 2 weeks Steele Sizer, MD [facility], [place]   In 1 month [facility], [place], MD [place]           . levothyroxine (SYNTHROID) 75 MCG tablet  [Pharmacy Med Name: LEVOTHYROXINE 0.075MG  (75MCG) TABS] 90 tablet 1    Sig: TAKE 1 TABLET BY MOUTH DAILY     Endocrinology:  Hypothyroid Agents Failed - 05/15/2020  6:22 AM      Failed - TSH needs to be rechecked within 3 months after an abnormal result. Refill until TSH is due.      Failed - TSH in normal range and within 360 days    TSH  Date Value Ref Range Status  05/12/2019 0.742 0.450 - 4.500 uIU/mL Final         Passed - Valid encounter within last 12 months    Recent Outpatient Visits          2 months ago Type 2 diabetes mellitus with microalbuminuria, with long-term current use of insulin [facility])   [facility], Drue Stager, MD   5 months ago Type 2 diabetes mellitus with microalbuminuria, with long-term current use of insulin [facility])   [facility] Steele Sizer, MD   6 months ago Urinary frequency   [facility]  Towanda Malkin, MD   7 months ago Well adult exam   [facility] Steele Sizer, MD   10 months ago Upper respiratory tract infection, unspecified type   [facility] Steele Sizer, MD      Future Appointments            In 2 weeks Ancil Boozer, Drue Stager, MD [facility] [place]   In 1 month [facility], [place], MD [place]

## 2020-06-04 ENCOUNTER — Ambulatory Visit: Payer: Managed Care, Other (non HMO) | Admitting: Family Medicine

## 2020-07-11 ENCOUNTER — Encounter: Payer: Self-pay | Admitting: Dermatology

## 2020-07-11 ENCOUNTER — Ambulatory Visit (INDEPENDENT_AMBULATORY_CARE_PROVIDER_SITE_OTHER): Payer: Managed Care, Other (non HMO) | Admitting: Dermatology

## 2020-07-11 ENCOUNTER — Other Ambulatory Visit: Payer: Self-pay

## 2020-07-11 DIAGNOSIS — L578 Other skin changes due to chronic exposure to nonionizing radiation: Secondary | ICD-10-CM

## 2020-07-11 DIAGNOSIS — L659 Nonscarring hair loss, unspecified: Secondary | ICD-10-CM

## 2020-07-11 DIAGNOSIS — D2371 Other benign neoplasm of skin of right lower limb, including hip: Secondary | ICD-10-CM

## 2020-07-11 DIAGNOSIS — Z1283 Encounter for screening for malignant neoplasm of skin: Secondary | ICD-10-CM

## 2020-07-11 DIAGNOSIS — D1800 Hemangioma unspecified site: Secondary | ICD-10-CM

## 2020-07-11 DIAGNOSIS — L811 Chloasma: Secondary | ICD-10-CM | POA: Diagnosis not present

## 2020-07-11 DIAGNOSIS — L689 Hypertrichosis, unspecified: Secondary | ICD-10-CM | POA: Diagnosis not present

## 2020-07-11 DIAGNOSIS — L83 Acanthosis nigricans: Secondary | ICD-10-CM

## 2020-07-11 DIAGNOSIS — D239 Other benign neoplasm of skin, unspecified: Secondary | ICD-10-CM

## 2020-07-11 DIAGNOSIS — D1723 Benign lipomatous neoplasm of skin and subcutaneous tissue of right leg: Secondary | ICD-10-CM | POA: Diagnosis not present

## 2020-07-11 DIAGNOSIS — L821 Other seborrheic keratosis: Secondary | ICD-10-CM

## 2020-07-11 DIAGNOSIS — L814 Other melanin hyperpigmentation: Secondary | ICD-10-CM

## 2020-07-11 DIAGNOSIS — D229 Melanocytic nevi, unspecified: Secondary | ICD-10-CM

## 2020-07-11 MED ORDER — FINASTERIDE 5 MG PO TABS: ORAL_TABLET | ORAL | 3 refills | Status: DC

## 2020-07-11 MED ORDER — VANIQA 13.9 % EX CREA: TOPICAL_CREAM | CUTANEOUS | 3 refills | Status: DC

## 2020-07-11 NOTE — Progress Notes (Signed)
New Patient Visit  Subjective  Tabitha Shaw is a 60 y.o. female who presents for the following: Annual Exam. Patient would like to discuss hyperpigmentation on the cheeks, thinning hair, and facial hair. She has a personal hx of PCOS.   The following portions of the chart were reviewed this encounter and updated as appropriate:   Tobacco  Allergies  Meds  Problems  Med Hx  Surg Hx  Fam Hx      Review of Systems:  No other skin or systemic complaints except as noted in HPI or Assessment and Plan.  Objective  Well appearing patient in no apparent distress; mood and affect are within normal limits.  A full examination was performed including scalp, head, eyes, ears, nose, lips, neck, chest, axillae, abdomen, back, buttocks, bilateral upper extremities, bilateral lower extremities, hands, feet, fingers, toes, fingernails, and toenails. All findings within normal limits unless otherwise noted below.   Assessment & Plan  Melasma Face  Chronic condition with duration or expected duration over one year. Condition is bothersome to patient. Currently flared.  Melasma is a condition of persistent pigmented patches generally on the face, worse in summer due to higher UV exposure.  Oral estrogen containing BCPs or supplements can exacerbate condition.  Recommend daily broad spectrum tinted sunscreen SPF 30+ to face, preferably with Zinc or Titanium Dioxide. Discussed Rx topical bleaching creams (i.e. hydroquinone), OTC HelioCare supplement, chemical peels (would need multiple for best result).   Will prescribe [facility] Hydroquinone 12%/kojic acid/vitamin C cream pea sized amount twice daily to the entire face for up to 3 months. This cannot be used more than 3 months due to risk of exogenous ochronosis (permanent dark spots). The patient was advised this is not covered by insurance since it is made by a compounding pharmacy. They will receive an email to check out and the  medication will be mailed to their home.    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 Start Vaniqa BID to aa's.   Eflornithine HCl (VANIQA) 13.9 % cream - Face Apply to affected areas of face BID.  Lipoma of right lower extremity R lower leg  Benign-appearing.  Observation.  Call clinic for new or changing lesions.   Acanthosis nigricans Neck  Benign-appearing.  Observation.  Call clinic for new or changing lesions.  Recommend daily use of broad spectrum spf 30+ sunscreen to sun-exposed areas.   Patient being followed by her PCP for diabetes and PCOS.   Dermatofibroma R calf  Benign-appearing.  Observation.  Call clinic for new or changing lesions.  Recommend daily use of broad spectrum spf 30+ sunscreen to sun-exposed areas.    Alopecia Scalp  Age related hair loss and telogen effluvium - patient does have thyroid disease and PCOS   Recommend minoxidil 5% (Rogaine for men) solution or foam to be applied to the scalp and left in. This should ideally be used twice daily for best results but it helps with hair regrowth when used at least three times per week. Rogaine initially can cause increased hair shedding for the first few weeks but this will stop with continued use. In studies, people who used minoxidil (Rogaine) for at least 6 months had thicker hair than people who did not. Minoxidil topical (Rogaine) only works as long as it continues to be used. If if it is no longer used then the hair it has been  helping to regrow can fall out. Minoxidil topical (Rogaine) can cause increased facial hair growth which can usually be managed easily with a battery-operated hair trimmer. If facial hair growth is bothersome, switching to the 2% women's version can decrease the risk of unwanted facial hair growth.   Discussed Finasteride 2.5mg  po QD. Will check for medication reactions before  prescribing.   Reviewed labs with patient iron and vitamin D levels - WNL. TSH at the low end of normal.  Lentigines - Scattered tan macules - Due to sun exposure - Benign-appering, observe - Recommend daily broad spectrum sunscreen SPF 30+ to sun-exposed areas, reapply every 2 hours as needed. - Call for any changes  Seborrheic Keratoses - Stuck-on, waxy, tan-brown papules and/or plaques  - Benign-appearing - Discussed benign etiology and prognosis. - Observe - Call for any changes  Melanocytic Nevi - Tan-brown and/or pink-flesh-colored symmetric macules and papules - Benign appearing on exam today - Observation - Call clinic for new or changing moles - Recommend daily use of broad spectrum spf 30+ sunscreen to sun-exposed areas.   Hemangiomas - Red papules - Discussed benign nature - Observe - Call for any changes  Actinic Damage - Chronic condition, secondary to cumulative UV/sun exposure - diffuse scaly erythematous macules with underlying dyspigmentation - Recommend daily broad spectrum sunscreen SPF 30+ to sun-exposed areas, reapply every 2 hours as needed.  - Staying in the shade or wearing long sleeves, sun glasses (UVA+UVB protection) and wide brim hats (4-inch brim around the entire circumference of the hat) are also recommended for sun protection.  - Call for new or changing lesions.  Skin cancer screening performed today.  Return in about 2 months (around 09/10/2020) for Melasma follow up .  Luther Redo, CMA, am acting as scribe for Forest Gleason, MD .  Documentation: I have reviewed the above documentation for accuracy and completeness, and I agree with the above.  Forest Gleason, MD

## 2020-07-11 NOTE — Patient Instructions (Addendum)
If you have any questions or concerns for your doctor, please call our main line at [phone] and press option 4 to reach your doctor's medical assistant. If no one answers, please leave a voicemail as directed and we will return your call as soon as possible. Messages left after 4 pm will be answered the following business day.   You may also send [place] a message via [place]. We typically respond to MyChart messages within 1-2 business days.  For prescription refills, please ask your pharmacy to contact our office. Our fax number is [phone].  If you have an urgent issue when the clinic is closed that cannot wait until the next business day, you can page your doctor at the number below.    Please note that while we do our best to be available for urgent issues outside of office hours, we are not available 24/7.   If you have an urgent issue and are unable to reach [place], you may choose to seek medical care at your doctor's office, retail clinic, urgent care center, or emergency room.  If you have a medical emergency, please immediately call 911 or go to the emergency department.  Pager Numbers  - Dr. Nehemiah Massed: [phone]  - Dr. Laurence Ferrari: [phone]  - Dr. Nicole Kindred: [phone]  In the event of inclement weather, please call our main line at [phone] for an update on the status of any delays or closures.  Dermatology Medication Tips: Please keep the boxes that topical medications come in in order to help keep track of the instructions about where and how to use these. Pharmacies typically print the medication instructions only on the boxes and not directly on the medication tubes.   If your medication is too expensive, please contact our office at [phone] option 4 or send [place] a message through [place].   We are unable to tell what your co-pay for medications will be in advance as this is different depending on your insurance coverage. However, we may be able to find a substitute  medication at lower cost or fill out paperwork to get insurance to cover a needed medication.   If a prior authorization is required to get your medication covered by your insurance company, please allow [place] 1-2 business days to complete this process.  Drug prices often vary depending on where the prescription is filled and some pharmacies may offer cheaper prices.  The website www.goodrx.com contains coupons for medications through different pharmacies. The prices here do not account for what the cost may be with help from insurance (it may be cheaper with your insurance), but the website can give you the price if you did not use any insurance.  - You can print the associated coupon and take it with your prescription to the pharmacy.  - You may also stop by our office during regular business hours and pick up a GoodRx coupon card.  - If you need your prescription sent electronically to a different pharmacy, notify our office through [facility] or by phone at [phone] option 4.  Recommend taking Heliocare sun protection supplement daily in sunny weather for additional sun protection. For maximum protection on the sunniest days, you can take up to 2 capsules of regular Heliocare OR take 1 capsule of Heliocare Ultra. For prolonged exposure (such as a full day in the sun), you can repeat your dose of the supplement 4 hours after your first dose. Heliocare can be purchased at [facility] or at [email or web address].  Instructions for [facility] Medications  One or more of your medications was sent to the [facility] mail order compounding pharmacy. You will receive an email from them and can purchase the medicine through that link. It will then be mailed to your home at the address you confirmed. If for any reason you do not receive an email from them, please check your spam folder. If you still do not find the email, please let us know. [facility] phone number is  [phone].

## 2020-07-12 ENCOUNTER — Encounter: Payer: Self-pay | Admitting: Oncology

## 2020-07-15 ENCOUNTER — Inpatient Hospital Stay: Payer: Managed Care, Other (non HMO)

## 2020-07-15 ENCOUNTER — Inpatient Hospital Stay: Payer: Managed Care, Other (non HMO) | Attending: Nurse Practitioner | Admitting: Nurse Practitioner

## 2020-07-15 ENCOUNTER — Encounter: Payer: Self-pay | Admitting: Nurse Practitioner

## 2020-07-15 NOTE — Progress Notes (Signed)
Performed 500 ml phlebotomy as ordered by NP.  Patient tolerated procedure well. Pt accepted a drink and snack, VSS. Discharged to home without any complaints. Discussed U/S that has been scheduled for tomm. Pt instructed to be NPO after midnight.

## 2020-07-15 NOTE — Progress Notes (Signed)
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  INTERVAL HISTORY: Patient is 60 year old female who returns to clinic for routine 35-monthevaluation, lab work, and consideration of phlebotomy.  She continues to feel well and remains asymptomatic.Denies any neurologic complaints. Denies recent fevers or illnesses. Denies any easy bleeding or bruising. No melena or hematochezia.Reports good appetite and denies weight loss. Denies chest pain. Denies any nausea, vomiting, constipation, or diarrhea. Denies urinary complaints. Patient offers no further specific complaints today.  REVIEW OF SYSTEMS:   Review of Systems  Constitutional: Negative.  Negative for fever, malaise/fatigue and weight loss.  Respiratory: Negative.  Negative for cough and shortness of breath.   Cardiovascular: Negative.  Negative for chest pain and leg swelling.  Gastrointestinal: Negative.  Negative for abdominal pain, blood in stool and melena.  Genitourinary: Negative.  Negative for dysuria.  Musculoskeletal: Negative.  Negative for back pain.  Skin: Negative.  Negative for rash.  Neurological: Negative.  Negative for sensory change, focal weakness and weakness.  Psychiatric/Behavioral: Negative.  The patient is not nervous/anxious.   As per HPI. Otherwise, a complete review of systems is negative.  PAST MEDICAL HISTORY: Past Medical History:  Diagnosis Date   Acquired acanthosis nigricans    Diabetes type 2, controlled (HPine Level    if controlled was not specified   Hemochromatosis    Hepatomegaly     HLD (hyperlipidemia)    HTN (hypertension)    Hyperlipidemia    Hypertension    Hypertrophy of kidney    Hypothyroidism    Intertrigo    Obesity    Osteopenia    Postinflammatory hyperpigmentation    Snoring    Thyroid disease    Vitamin D deficiency     PAST SURGICAL HISTORY: Past Surgical History:  Procedure Laterality Date   ABDOMINAL HYSTERECTOMY  2005   blood transfusion yearly     BREAST REDUCTION SURGERY     [facility] [place]  COLONOSCOPY WITH PROPOFOL N/A 11/11/2018   Procedure: COLONOSCOPY WITH PROPOFOL;  Surgeon: TVirgel Manifold MD;  Location: [facility] ENDOSCOPY;  Service: Endoscopy;  Laterality: N/A;   COLONOSCOPY WITH PROPOFOL N/A 02/16/2020   Procedure: COLONOSCOPY WITH PROPOFOL;  Surgeon: TVirgel Manifold MD;  Location: [facility] ENDOSCOPY;  Service: Endoscopy;  Laterality: N/A;   lapcholecystectomy     PARATHYROIDECTOMY Right 11/26/2017   right lower , [facility]      FAMILY HISTORY Family History  Problem Relation Age of Onset   Colon polyps Mother    Hemachromatosis Mother    Hypertension Mother    Depression Sister    Hypertension Brother    Colon polyps Brother    Cancer Father    Breast cancer Cousin    Breast cancer Cousin        ADVANCED DIRECTIVES:    HEALTH MAINTENANCE: Social History   Tobacco Use   Smoking status: Former    Packs/day: 0.25    Years: 8.00    Pack years: 2.00  Types: Cigarettes    Start date: 01/19/1982    Quit date: 01/19/1990    Years since quitting: 30.5   Smokeless tobacco: Never  Vaping Use   Vaping Use: Never used  Substance Use Topics   Alcohol use: Yes    Alcohol/week: 0.0 standard drinks    Comment: occasionally   Drug use: No     Colonoscopy:  PAP:  Bone density:  Lipid panel:  No Known Allergies  Current Outpatient Medications  Medication Sig Dispense  Refill   amLODipine-benazepril (LOTREL) 5-20 MG capsule Take 1 capsule by mouth daily. 90 capsule 1   aspirin 81 MG EC tablet Take 81 mg by mouth daily.     blood glucose meter kit and supplies Dispense based on patient and insurance preference. Use up to four times daily as directed. (FOR ICD-10 E10.9, E11.9). 1 each 0   Eflornithine HCl (VANIQA) 13.9 % cream Apply to affected areas of face BID. 45 g 3   finasteride (PROSCAR) 5 MG tablet Take 1/2 tablet by mouth daily 15 tablet 3   fluticasone (FLONASE) 50 MCG/ACT nasal spray SHAKE LIQUID AND USE 2 SPRAYS IN EACH NOSTRIL DAILY (Patient not taking: Reported on 07/11/2020) 48 g 0   ketoconazole (NIZORAL) 2 % shampoo Apply topically 2 (two) times a week. 120 mL 2   levothyroxine (SYNTHROID) 75 MCG tablet TAKE 1 TABLET BY MOUTH DAILY 90 tablet 1   meloxicam (MOBIC) 7.5 MG tablet Take 7.5 mg by mouth 2 (two) times daily. (Patient not taking: Reported on 07/11/2020)     metFORMIN (GLUCOPHAGE-XR) 750 MG 24 hr tablet Take 1 tablet (750 mg total) by mouth daily with breakfast. 90 tablet 3   ONE TOUCH ULTRA TEST test strip CHECK FASTING BLOOD SUGAR  TWICE WEEKLY 100 each 1   rosuvastatin (CRESTOR) 40 MG tablet TAKE 1 TABLET BY MOUTH DAILY 90 tablet 1   Semaglutide, 1 MG/DOSE, (OZEMPIC, 1 MG/DOSE,) 4 MG/3ML SOPN Inject 1 mg into the skin once a week. 9 mL 1   Vitamin D, Ergocalciferol, (DRISDOL) 1.25 MG (50000 UNIT) CAPS capsule TAKE 1 CAPSULE BY MOUTH 2 TIMES A WEEK 24 capsule 1   No current facility-administered medications for this visit.    OBJECTIVE: There were no vitals filed for this visit.    There is no height or weight on file to calculate BMI.    ECOG FS:0 - Asymptomatic  General: Well-developed, well-nourished, no acute distress. Eyes: Pink conjunctiva, anicteric sclera. Lungs: Clear to auscultation bilaterally.  No audible wheezing or coughing Heart: Regular rate and rhythm.  Abdomen: Soft, nontender, nondistended.  Musculoskeletal: No  edema, cyanosis, or clubbing. Neuro: Alert, answering all questions appropriately. Cranial nerves grossly intact. Skin: No rashes or petechiae noted. Psych: Normal affect.   LAB RESULTS:  Lab Results  Component Value Date   NA 141 05/12/2019   K 4.5 05/12/2019   CL 100 05/12/2019   CO2 27 05/12/2019   GLUCOSE 123 (H) 05/12/2019   BUN 11 05/12/2019   CREATININE 0.81 05/12/2019   CALCIUM 10.1 05/12/2019   PROT 7.6 05/12/2019   ALBUMIN 4.8 05/12/2019   AST 25 05/12/2019   ALT 24 05/12/2019   ALKPHOS 72 05/12/2019   BILITOT 0.8 05/12/2019   GFRNONAA 81 05/12/2019   GFRAA 94 05/12/2019    Lab Results  Component Value Date   WBC 7.5 08/26/2017   NEUTROABS 3.7 08/26/2017   HGB 14.9 08/26/2017   HCT 42.4 08/26/2017   MCV 102 (H) 08/26/2017  PLT 253 08/26/2017   Iron/TIBC/Ferritin/ %Sat    Component Value Date/Time   IRON 147 08/26/2017 1034   IRON 113 03/08/2012 0918   TIBC 245 (L) 08/26/2017 1034   TIBC 289 03/08/2012 0918   FERRITIN 186 (H) 08/26/2017 1034   FERRITIN 137 03/08/2012 0918   IRONPCTSAT 60 (H) 08/26/2017 1034   IRONPCTSAT 39 03/08/2012 0918      STUDIES: No results found.  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  PLAN:    1.  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  Ferritin 151 (elevated).  Goal ferritin is between 501 100.  Proceed with 500 cc phlebotomy today.  will check AFP at next visit for liver cancer screening and right upper quadrant ultrasound.  Return to clinic in 6 months with repeat lab work, further evaluation, and possible phlebotomy.  Patient continues to get all of her labs at [facility].  Paper order completed today.  2. 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  Clinically asymptomatic.  Continue to monitor.  3.  Parathyroid surgery-continue treatment and follow-up at [facility].  Plan:  Phlebotomy today Liver u/s 6 mo- lab (cbc, ferritin, iron, afp), MD, poss phlebotomy-  la  Patient expressed understanding and was in agreement with this plan. She also understands that She can call clinic at any time with any questions, concerns, or complaints.    Verlon Au, NP   07/15/2020 4:15 PM

## 2020-07-15 NOTE — Progress Notes (Signed)
Patient denies new problems/concerns today.   °

## 2020-07-16 ENCOUNTER — Other Ambulatory Visit: Payer: Self-pay

## 2020-07-16 ENCOUNTER — Ambulatory Visit
Admission: RE | Admit: 2020-07-16 | Discharge: 2020-07-16 | Disposition: A | Discharge status: Home / Self Care | Payer: Managed Care, Other (non HMO) | Source: Ambulatory Visit | Attending: Nurse Practitioner | Admitting: Nurse Practitioner

## 2020-07-16 IMAGING — US US ABDOMEN LIMITED
1 series · 14 of 25 positions shown · non-contrast
Comparison: Ultrasound report.

CLINICAL DATA: Hemochromatosis- screening.

EXAM:
ULTRASOUND ABDOMEN LIMITED RIGHT UPPER QUADRANT

[Series 1: us abdomen limited ruq (liver/gb) · 14 of 39 slices shown]
[im 1/39]
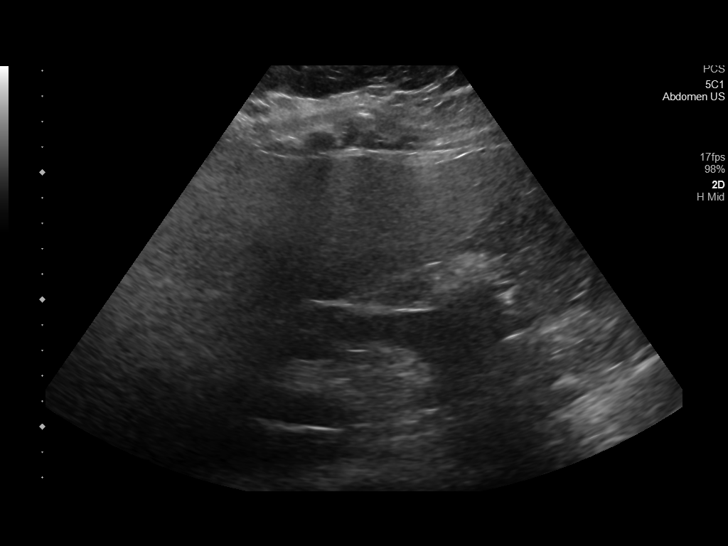
[im 4/39]
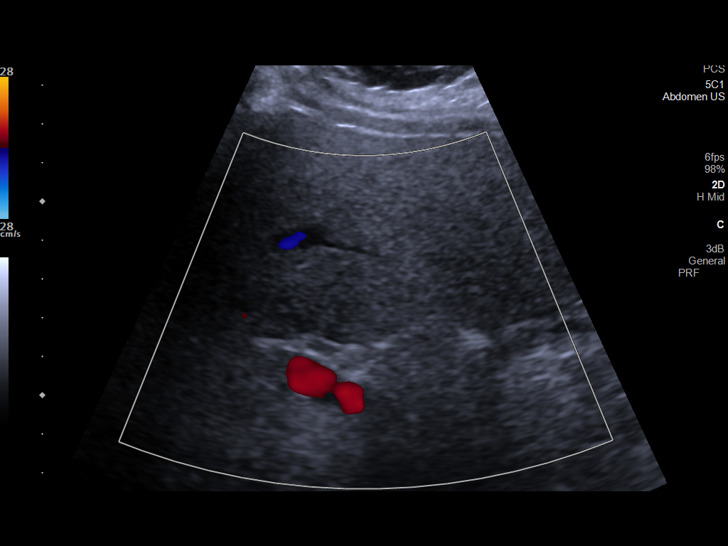
[im 7/39]
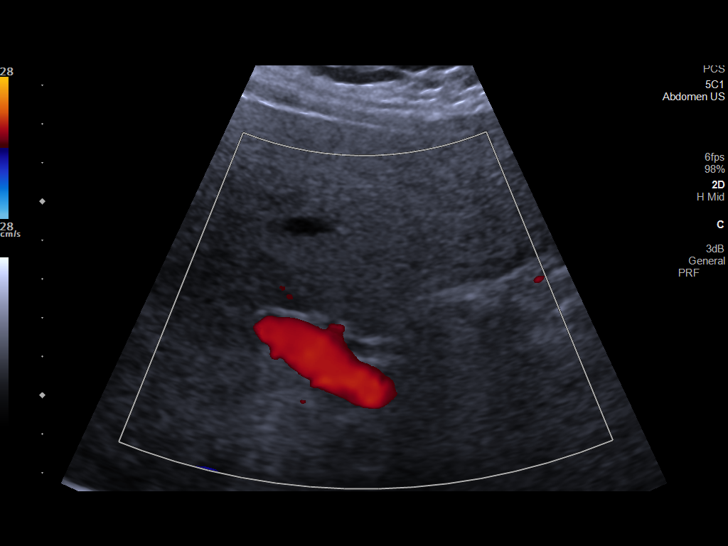
[im 10/39]
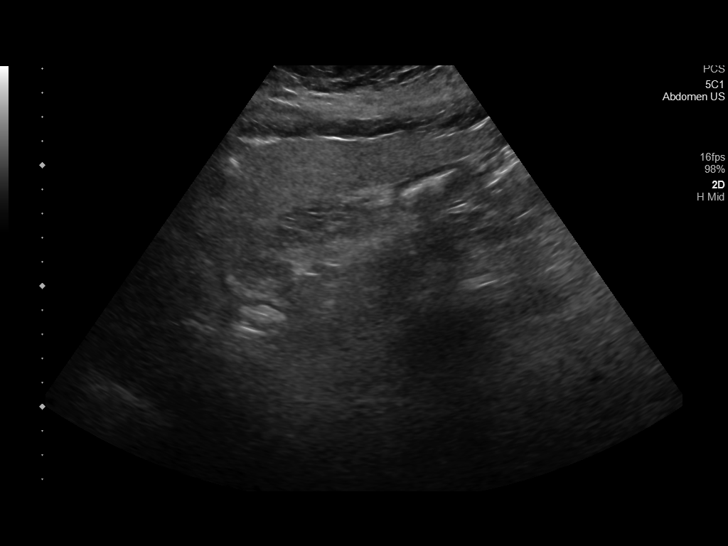
[im 13/39]
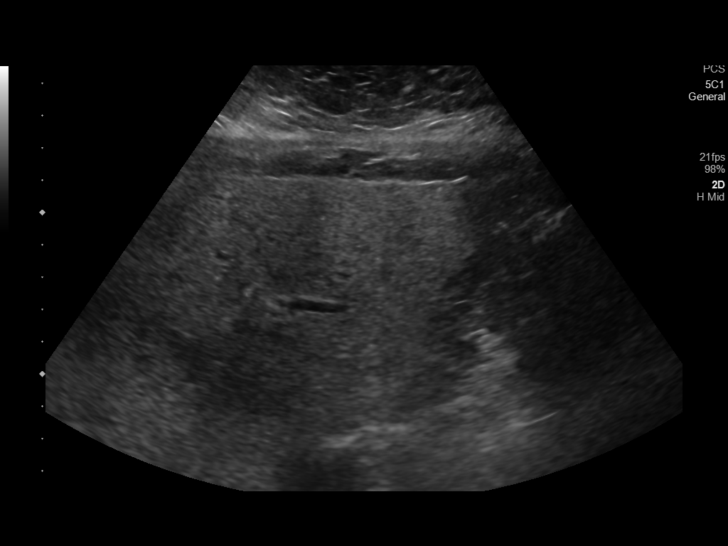
[im 15/39]
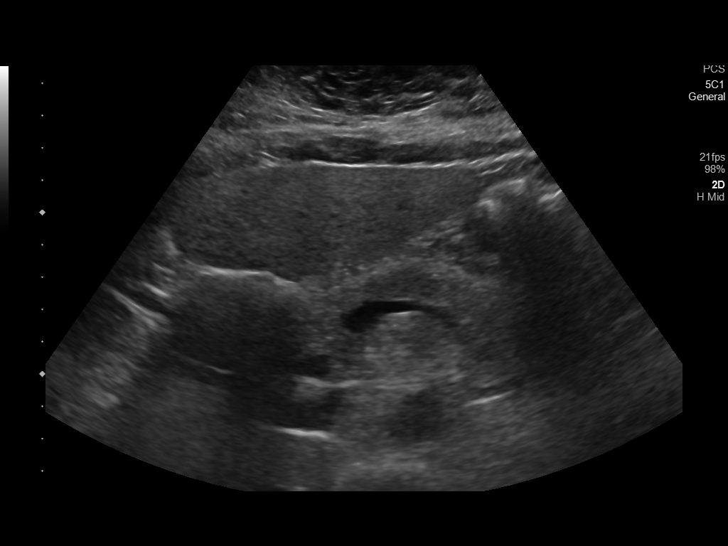
[im 18/39]
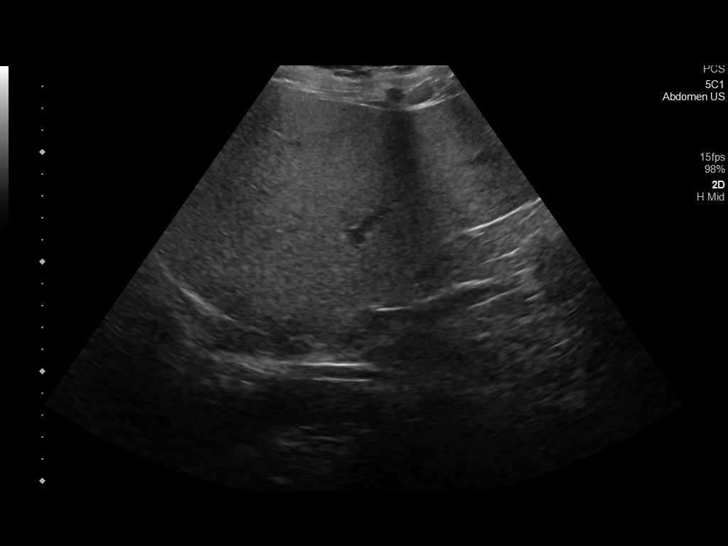
[im 21/39]
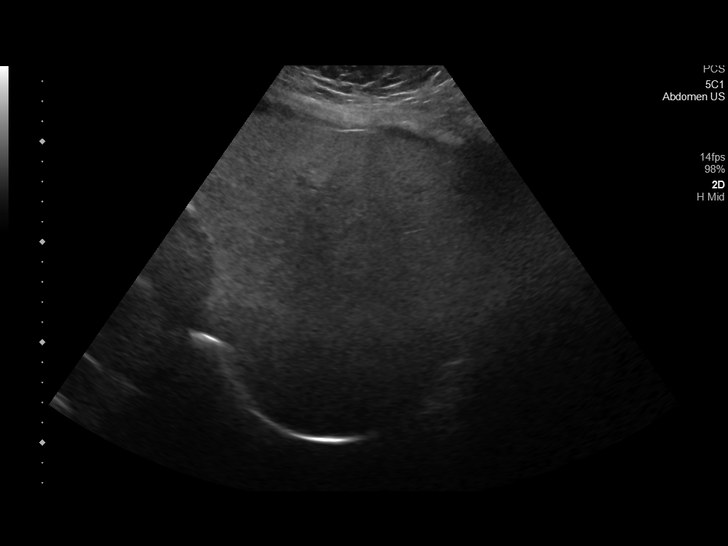
[im 24/39]
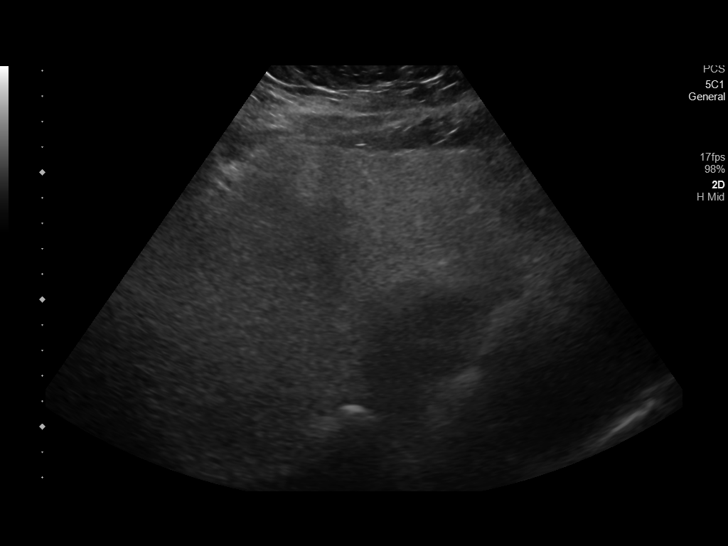
[im 26/39]
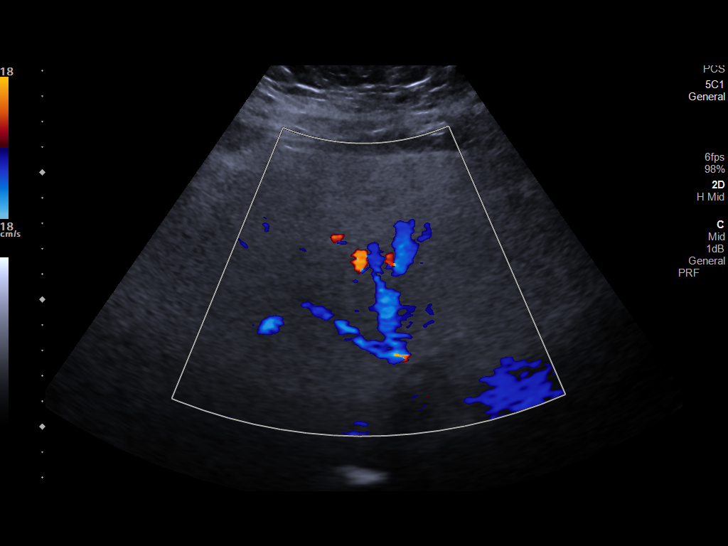
[im 29/39]
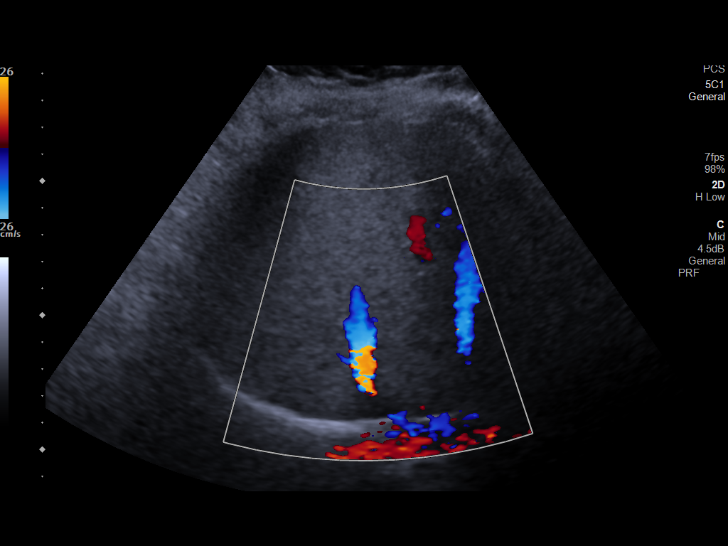
[im 32/39]
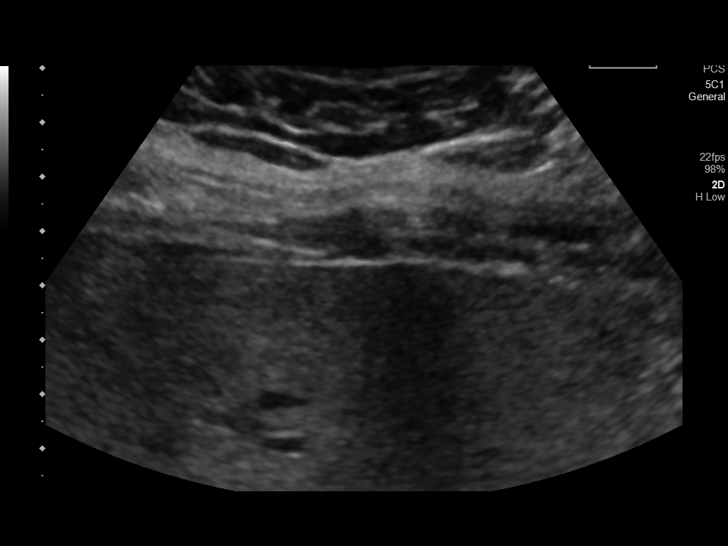
[im 35/39]
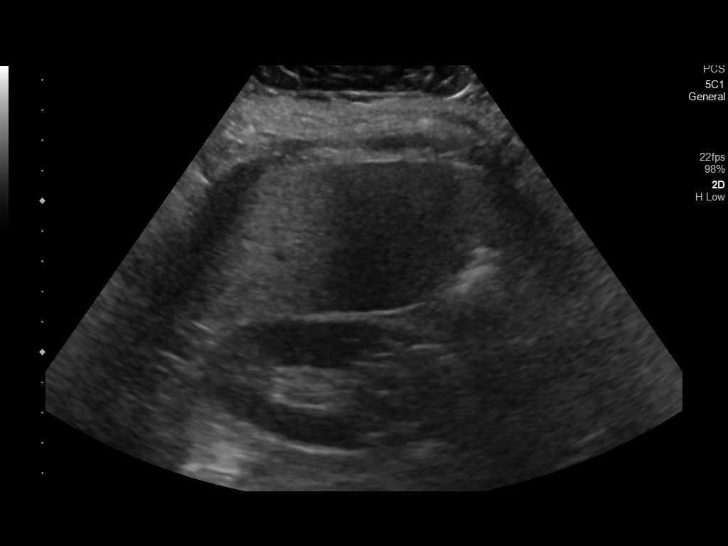
[im 39/39]
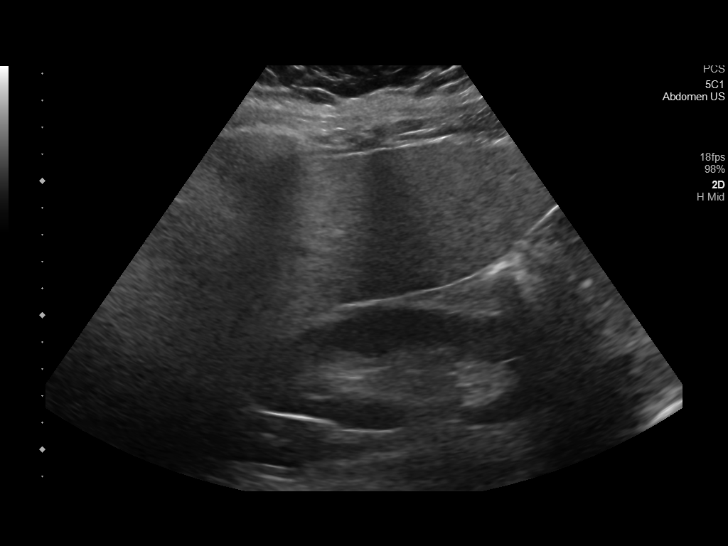

[14 of 25 positions shown; findings below may reference images not displayed]

FINDINGS: Gallbladder:

Cholecystectomy.

Common bile duct:

Diameter: 4 mm

Liver:

Increased echogenicity of the liver consistent fatty infiltration or
hepatocellular disease. Portal vein is patent on color Doppler
imaging with normal direction of blood flow towards the liver.

Other: None.
IMPRESSION: 1.  Prior cholecystectomy.  No biliary distention.

2. Increased echogenicity of the liver consistent with fatty
infiltration and or hepatocellular disease. No focal hepatic
abnormality identified.

## 2020-07-24 ENCOUNTER — Encounter: Payer: Self-pay | Admitting: Dermatology

## 2020-07-24 ENCOUNTER — Other Ambulatory Visit: Payer: Self-pay | Admitting: Dermatology

## 2020-07-24 DIAGNOSIS — L689 Hypertrichosis, unspecified: Secondary | ICD-10-CM

## 2020-07-31 ENCOUNTER — Other Ambulatory Visit: Payer: Self-pay | Admitting: Neurology

## 2020-07-31 ENCOUNTER — Other Ambulatory Visit (HOSPITAL_COMMUNITY): Payer: Self-pay | Admitting: Neurology

## 2020-07-31 DIAGNOSIS — R413 Other amnesia: Secondary | ICD-10-CM

## 2020-08-06 ENCOUNTER — Ambulatory Visit: Payer: Managed Care, Other (non HMO) | Admitting: Family Medicine

## 2020-08-26 ENCOUNTER — Other Ambulatory Visit: Payer: Self-pay

## 2020-08-26 ENCOUNTER — Ambulatory Visit (HOSPITAL_COMMUNITY)
Admission: RE | Admit: 2020-08-26 | Discharge: 2020-08-26 | Disposition: A | Discharge status: Home / Self Care | Payer: Managed Care, Other (non HMO) | Source: Ambulatory Visit | Attending: Neurology | Admitting: Neurology

## 2020-08-26 DIAGNOSIS — R413 Other amnesia: Secondary | ICD-10-CM | POA: Diagnosis not present

## 2020-08-26 LAB — LIPID PANEL
Cholesterol: 135 (ref 0–200)
Triglycerides: 197 — AB (ref 40–160)

## 2020-08-26 LAB — BASIC METABOLIC PANEL: Glucose: 140

## 2020-08-26 IMAGING — MR MR HEAD W/O CM
7 of 11 series · 15 of 48 positions shown · non-contrast
Comparison: None.

CLINICAL DATA: Memory loss.

EXAM:
MRI HEAD WITHOUT CONTRAST
TECHNIQUE: Multiplanar, multiecho pulse sequences of the brain and surrounding
structures were obtained without intravenous contrast.
Additionally, using NeuroQuant software a 3D volumetric analysis of
the brain was performed and is compared to a normative database
adjusted for age, gender and intracranial volume.

[Series 2: DWI · axial · 3.0mm · 0.94mm/px · z∈[-60,+77]mm · 4 of 94 slices shown]
[im 1/94]
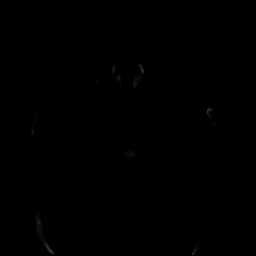
[im 32/94]
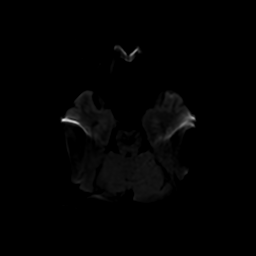
[im 63/94]
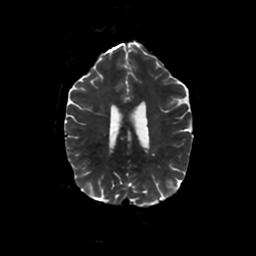
[im 94/94]
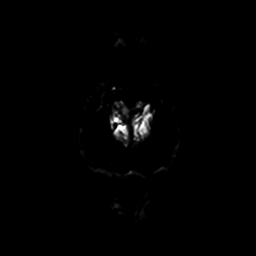

[Series 4: FLAIR · sagittal · 5.0mm · 0.23mm/px · 1 of 24 slices shown (1 of 2)]
[im 1/24]
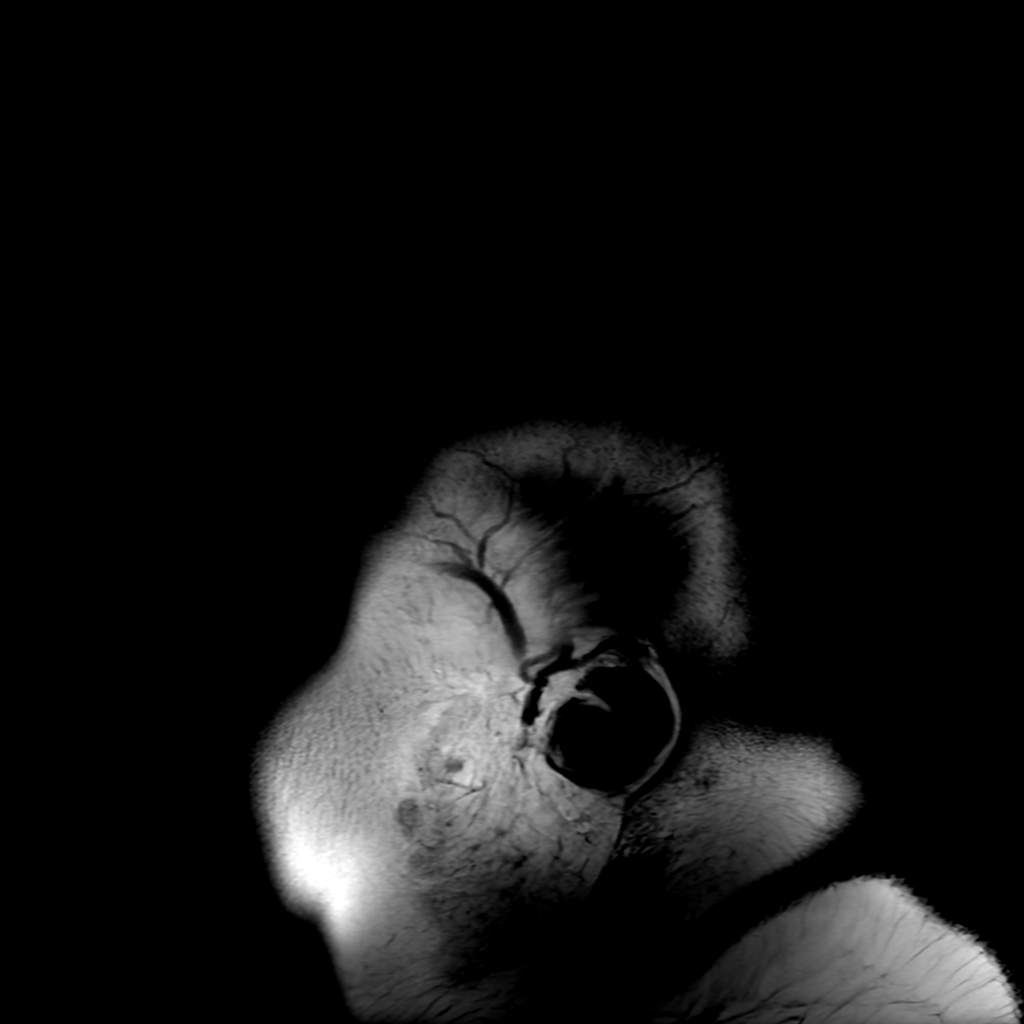

[Series 5: T2 · axial · 5.0mm · 0.23mm/px · 1 of 23 slices shown (1 of 2)]
[im 1/23]
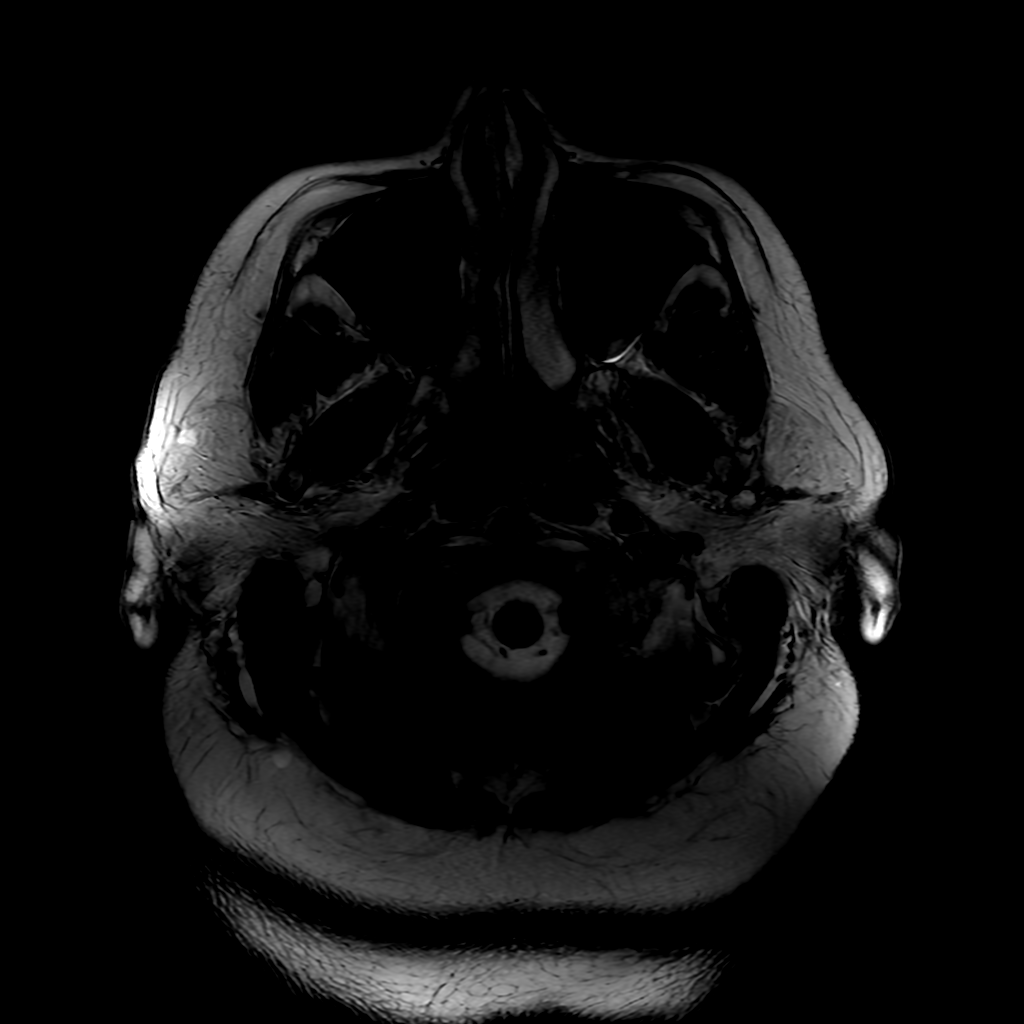

[Series 6: FLAIR · axial · 4.0mm · 0.45mm/px · z∈[-60,+77]mm · 2 of 33 slices shown (2 of 2)]
[im 1/33]
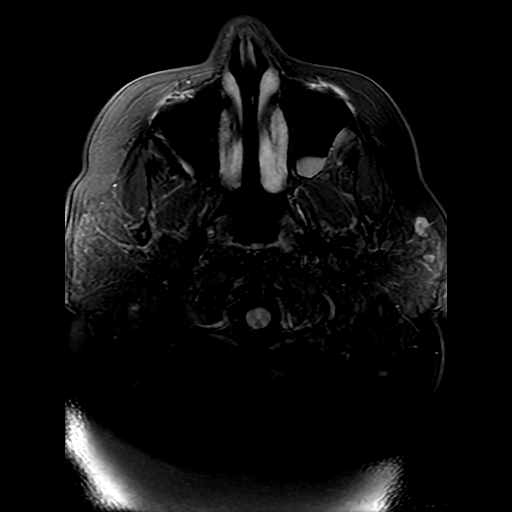
[im 33/33]
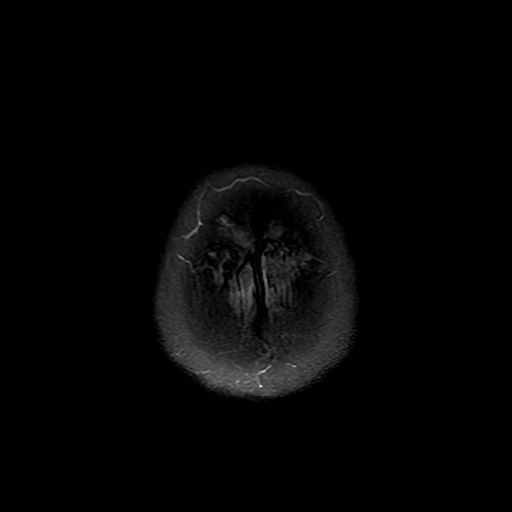

[Series 7: (person_name) · axial · 3.0mm · 0.47mm/px · z∈[-62,+46]mm · 4 of 100 slices shown]
[im 1/100]
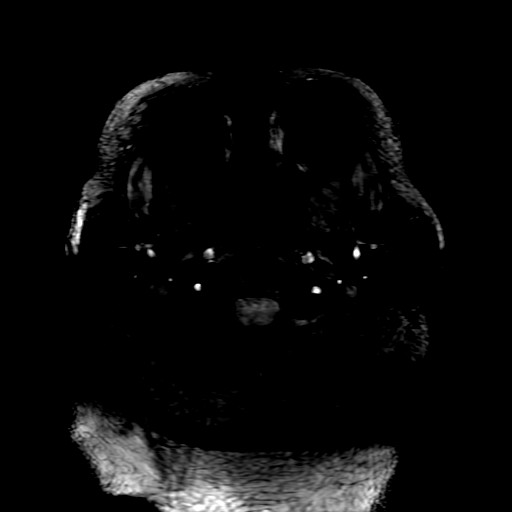
[im 25/100]
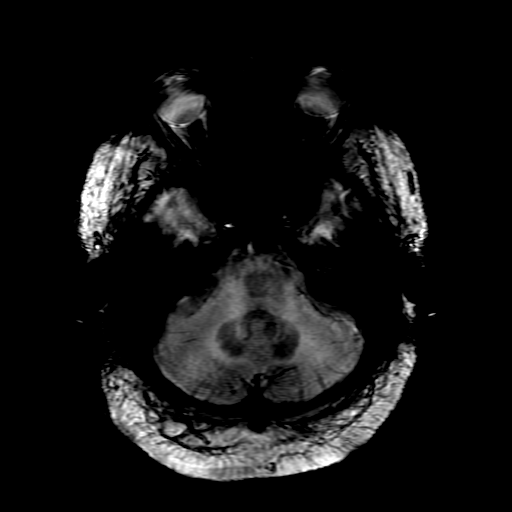
[im 50/100]
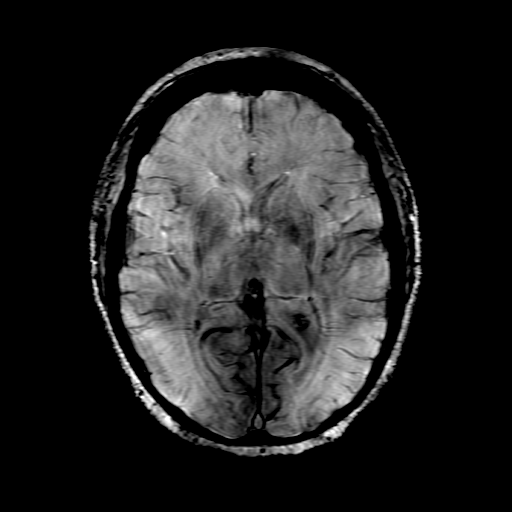
[im 75/100]
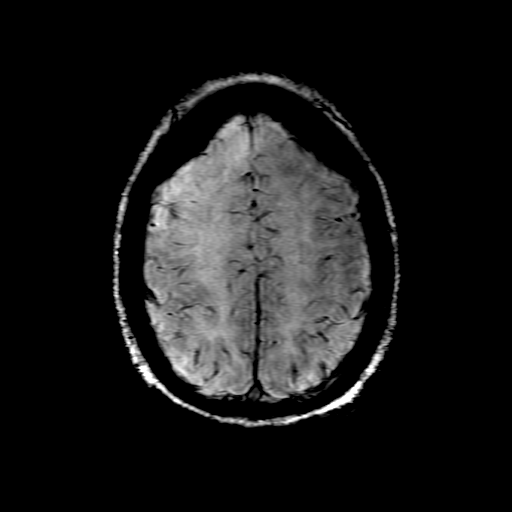

[Series 9: T2 · coronal · 5.0mm · 0.20mm/px · 1 of 28 slices shown (2 of 2)]
[im 1/28]
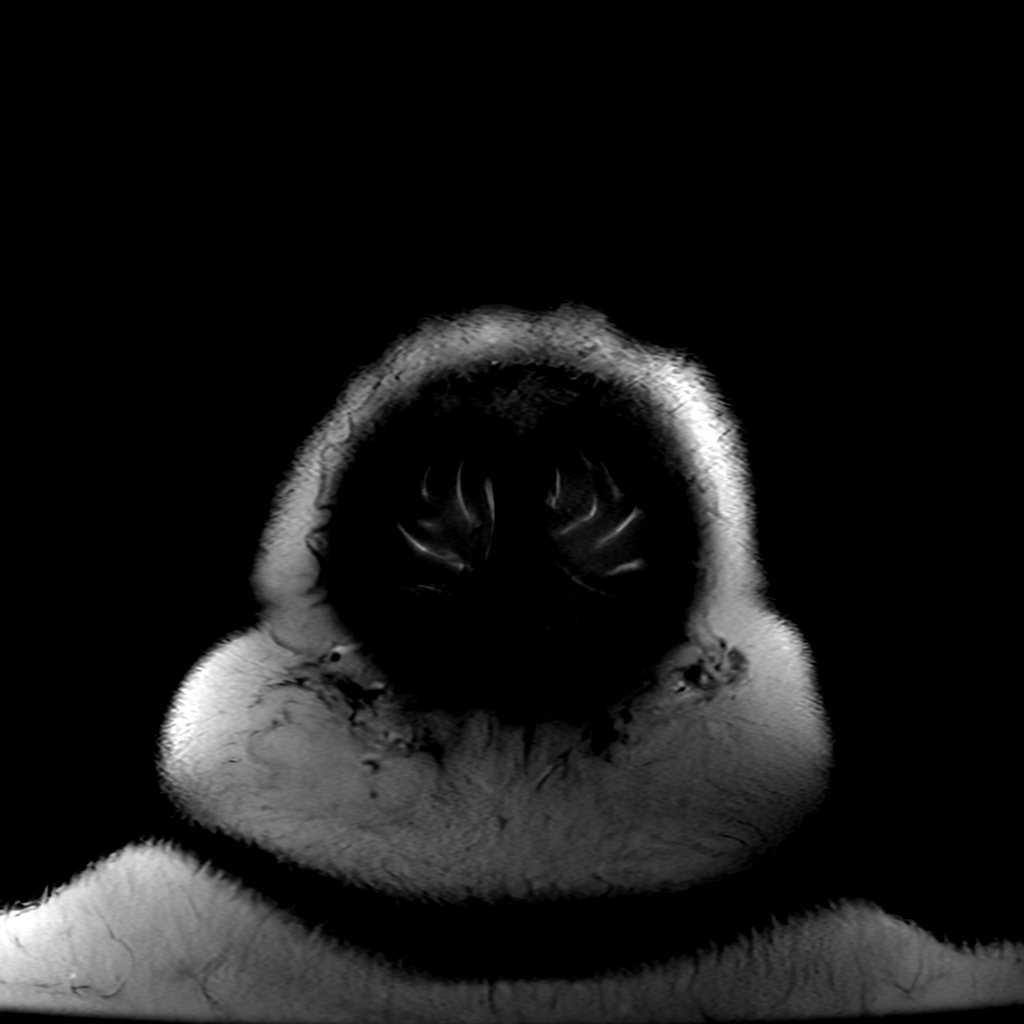

[Series 250: ADC · axial · 3.0mm · 0.94mm/px · z∈[-60,+77]mm · 2 of 48 slices shown]
[im 1/48]
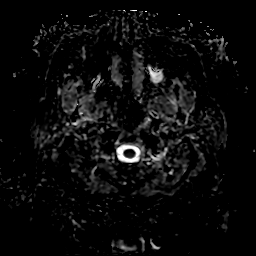
[im 48/48]
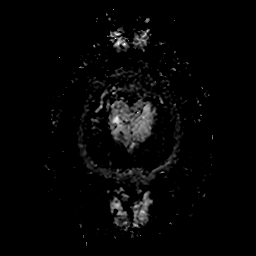

[15 of 48 positions shown; findings below may reference images not displayed]

FINDINGS: Brain: There is no evidence of acute infarct, intracranial
hemorrhage, midline shift, or extra-axial fluid collection. The
ventricles are normal in size. No significant white matter disease
is seen.

There is a 6 mm T1 and heterogeneously T2/FLAIR hyperintense sellar
and suprasellar mass. The mass contacts the superior margin of the
pituitary gland which appears depressed inferiorly, however the
pituitary largely appears separate from the mass. There is no mass
effect on the optic chiasm.

Vascular: Major intracranial vascular flow voids are preserved.

Skull and upper cervical spine: Unremarkable bone marrow signal para

Sinuses/Orbits: Unremarkable orbits. Mild mucosal thickening in the
maxillary sinuses with a small mucous retention cyst on the left.
Clear mastoid air cells.

Other: None.

NeuroQuant Findings:

Volumetric analysis of the brain was performed, with a fully
detailed report in [HOSPITAL] PACS. Briefly, the comparison with age and
gender matched reference reveals whole brain volume to be at the
66th percentile.
IMPRESSION: 1. No acute intracranial abnormality.
2. 6 mm sellar and suprasellar mass. A Rathke's cleft cyst is
favored although an adenoma is also possible. No further imaging
evaluation or follow-up is necessary. Consider endocrine function
tests and correlate for history of pituitary hypersecretion. This
follows ACR consensus guidelines: Management of Incidental Pituitary
Findings on CT, MRI and F18-FDG PET: A White Paper of the ACR
Incidental Findings Committee. [HOSPITAL] [W3]; 15: 966-72.
3. NeuroQuant volumetric analysis of the brain, see details on
[HOSPITAL] PACS.

## 2020-09-10 ENCOUNTER — Other Ambulatory Visit: Payer: Self-pay

## 2020-09-10 ENCOUNTER — Other Ambulatory Visit (HOSPITAL_COMMUNITY): Payer: Self-pay | Admitting: Neurology

## 2020-09-10 ENCOUNTER — Ambulatory Visit: Payer: Managed Care, Other (non HMO) | Admitting: Dermatology

## 2020-09-10 DIAGNOSIS — L811 Chloasma: Secondary | ICD-10-CM

## 2020-09-10 DIAGNOSIS — R413 Other amnesia: Secondary | ICD-10-CM

## 2020-09-10 DIAGNOSIS — R9089 Other abnormal findings on diagnostic imaging of central nervous system: Secondary | ICD-10-CM

## 2020-09-10 NOTE — Patient Instructions (Addendum)
Discontinue current Skin Medicinals prescription.  Start Skin Medicinals Hydroquinone 8%, Tretinoin 0.1%, Kojic acid 1%, Niacinamide 4%, Fluocinolone 0.025% cream, a pea sized amount nightly to dark spots on face for up to 2 months. This cannot be used more than 3 months due to risk of skin atrophy (thinning) and exogenous ochronosis (permanent dark spots). The patient was advised this is not covered by insurance. They will receive an email to check out and the medication will be mailed to their home.  In 1 month recommend starting The Perfect A nightly followed by moisturizer.  Also recommend The Inkey Tranexamic Acid at night, available at [facility].   Recommend tinted mineral sunscreen every morning. Some Recommended Sunscreens Include:  Tinted Face Sunscreen Alastin Hydratint (good for most skin tones, may be slightly dark if you are very fair) Colorescience Sunforgettable Total Protection Face Shield (good for most skin tones) EltaMD UV Physical La Roche Posay Mineral Tinted Cotz Flawless Complexion   Powder Sunscreen (Nice for reapplying or applying on the go) Colorescience Sunforgettable Total Protection Brush on Shield (available in different tints)  Face Sunscreen Available in Different Tints Colorescience Sunforgettable Total Protection Brush on Shield  bareMinerals Complexion Rescue Tinted Hydrating Gel Cream Broad Spectrum SPF 30 UnSun mineral tinted (comes in medium/dark and light/medium)  Recommend taking Heliocare sun protection supplement daily in sunny weather for additional sun protection. For maximum protection on the sunniest days, you can take up to 2 capsules of regular Heliocare OR take 1 capsule of Heliocare Ultra. For prolonged exposure (such as a full day in the sun), you can repeat your dose of the supplement 4 hours after your first dose. Heliocare can be purchased at [facility] or at [email or web address].       If you have any questions or concerns for  your doctor, please call our main line at [phone] and press option 4 to reach your doctor's medical assistant. If no one answers, please leave a voicemail as directed and we will return your call as soon as possible. Messages left after 4 pm will be answered the following business day.   You may also send [place] a message via [place]. We typically respond to MyChart messages within 1-2 business days.  For prescription refills, please ask your pharmacy to contact our office. Our fax number is [phone].  If you have an urgent issue when the clinic is closed that cannot wait until the next business day, you can page your doctor at the number below.    Please note that while we do our best to be available for urgent issues outside of office hours, we are not available 24/7.   If you have an urgent issue and are unable to reach [place], you may choose to seek medical care at your doctor's office, retail clinic, urgent care center, or emergency room.  If you have a medical emergency, please immediately call 911 or go to the emergency department.  Pager Numbers  - Dr. Nehemiah Massed: [phone]  - Dr. Laurence Ferrari: [phone]  - Dr. Nicole Kindred: [phone]  In the event of inclement weather, please call our main line at [phone] for an update on the status of any delays or closures.  Dermatology Medication Tips: Please keep the boxes that topical medications come in in order to help keep track of the instructions about where and how to use these. Pharmacies typically print the medication instructions only on the boxes and not directly on the medication tubes.   If your medication is too  expensive, please contact our office at [phone] option 4 or send [place] a message through [place].   We are unable to tell what your co-pay for medications will be in advance as this is different depending on your insurance coverage. However, we may be able to find a substitute medication at lower cost or fill out  paperwork to get insurance to cover a needed medication.   If a prior authorization is required to get your medication covered by your insurance company, please allow [place] 1-2 business days to complete this process.  Drug prices often vary depending on where the prescription is filled and some pharmacies may offer cheaper prices.  The website www.goodrx.com contains coupons for medications through different pharmacies. The prices here do not account for what the cost may be with help from insurance (it may be cheaper with your insurance), but the website can give you the price if you did not use any insurance.  - You can print the associated coupon and take it with your prescription to the pharmacy.  - You may also stop by our office during regular business hours and pick up a GoodRx coupon card.  - If you need your prescription sent electronically to a different pharmacy, notify our office through [facility] or by phone at [phone] option 4.

## 2020-09-10 NOTE — Progress Notes (Signed)
   Follow-Up Visit   Subjective  Tabitha Shaw is a 60 y.o. female who presents for the following: Follow-up (Patient here today for 2 month melasma follow up. Patient is currently using Skinmedicinals Hydroquinone 12%/kojic acid/vitamin C cream and has noticed improvement. ).   The following portions of the chart were reviewed this encounter and updated as appropriate:   Tobacco  Allergies  Meds  Problems  Med Hx  Surg Hx  Fam Hx      Review of Systems:  No other skin or systemic complaints except as noted in HPI or Assessment and Plan.  Objective  Well appearing patient in no apparent distress; mood and affect are within normal limits.  A focused examination was performed including face. Relevant physical exam findings are noted in the Assessment and Plan.  Head - Anterior (Face) See photos          Assessment & Plan  Melasma Head - Anterior (Face)  Chronic condition with duration or expected duration over one year. Condition is bothersome to patient. Not currently at goal.  Recommend tinted mineral sunscreen every morning.   D/c skin medicinals HQ12%/kojic acid/niacinamide  Start prescription Skin Medicinals Hydroquinone 8%, Tretinoin 0.1%, Kojic acid 1%, Niacinamide 4%, Fluocinolone 0.025% cream, a pea sized amount nightly to dark spots on face for up to 1 month. This cannot be used more than 3 months due to risk of skin atrophy (thinning) and exogenous ochronosis (permanent dark spots). The patient was advised this is not covered by insurance. They will receive an email to check out and the medication will be mailed to their home.  In 1 month recommend starting The Perfect A (tretinoin 0.1% cream with vitamin C) nightly followed by moisturizer.  Also recommend The Inkey Tranexamic Acid at night.   Return in about 4 months (around 01/10/2021) for melasma.  Graciella Belton, RMA, am acting as scribe for Forest Gleason, MD .   Documentation: I have  reviewed the above documentation for accuracy and completeness, and I agree with the above.  Forest Gleason, MD

## 2020-09-14 ENCOUNTER — Encounter: Payer: Self-pay | Admitting: Dermatology

## 2020-09-18 ENCOUNTER — Ambulatory Visit: Payer: Managed Care, Other (non HMO)

## 2020-10-11 ENCOUNTER — Other Ambulatory Visit: Payer: Self-pay | Admitting: Family Medicine

## 2020-10-11 DIAGNOSIS — Z1231 Encounter for screening mammogram for malignant neoplasm of breast: Secondary | ICD-10-CM

## 2020-10-17 ENCOUNTER — Other Ambulatory Visit: Payer: Self-pay | Admitting: Family Medicine

## 2020-10-17 DIAGNOSIS — E559 Vitamin D deficiency, unspecified: Secondary | ICD-10-CM

## 2020-10-22 LAB — COMPREHENSIVE METABOLIC PANEL
ALT: 25 IU/L (ref 0–32)
AST: 27 IU/L (ref 0–40)
Albumin/Globulin Ratio: 1.7 (ref 1.2–2.2)
Albumin: 4.5 g/dL (ref 3.8–4.9)
Alkaline Phosphatase: 76 IU/L (ref 44–121)
BUN/Creatinine Ratio: 12 (ref 9–23)
BUN: 11 mg/dL (ref 6–24)
Bilirubin Total: 0.7 mg/dL (ref 0.0–1.2)
CO2: 21 mmol/L (ref 20–29)
Calcium: 9.7 mg/dL (ref 8.7–10.2)
Chloride: 102 mmol/L (ref 96–106)
Creatinine, Ser: 0.95 mg/dL (ref 0.57–1.00)
Globulin, Total: 2.7 g/dL (ref 1.5–4.5)
Glucose: 142 mg/dL — ABNORMAL HIGH (ref 70–99)
Potassium: 4 mmol/L (ref 3.5–5.2)
Sodium: 142 mmol/L (ref 134–144)
Total Protein: 7.2 g/dL (ref 6.0–8.5)
eGFR: 69 mL/min/{1.73_m2} (ref >59)

## 2020-10-22 LAB — HEMOGLOBIN A1C
Est. average glucose Bld gHb Est-mCnc: 143 mg/dL
Hgb A1c MFr Bld: 6.6 % — ABNORMAL HIGH (ref 4.8–5.6)

## 2020-10-22 LAB — PARATHYROID HORMONE, INTACT (NO CA): PTH: 32 pg/mL (ref 15–65)

## 2020-10-22 LAB — MICROALBUMIN / CREATININE URINE RATIO
Creatinine, Urine: 98.9 mg/dL
Microalb/Creat Ratio: 259 mg/g creat — ABNORMAL HIGH (ref 0–29)
Microalbumin, Urine: 255.8 ug/mL

## 2020-10-22 LAB — LIPID PANEL
Chol/HDL Ratio: 3.1 ratio (ref 0.0–4.4)
Cholesterol, Total: 129 mg/dL (ref 100–199)
HDL: 41 mg/dL (ref >39)
LDL Chol Calc (NIH): 64 mg/dL (ref 0–99)
Triglycerides: 139 mg/dL (ref 0–149)
VLDL Cholesterol Cal: 24 mg/dL (ref 5–40)

## 2020-10-22 LAB — TSH: TSH: 0.737 u[IU]/mL (ref 0.450–4.500)

## 2020-10-22 LAB — VITAMIN D 25 HYDROXY (VIT D DEFICIENCY, FRACTURES): Vit D, 25-Hydroxy: 102 ng/mL — ABNORMAL HIGH (ref 30.0–100.0)

## 2020-10-22 LAB — VITAMIN B12: Vitamin B-12: 244 pg/mL (ref 232–1245)

## 2020-10-23 ENCOUNTER — Encounter: Payer: Self-pay | Admitting: Family Medicine

## 2020-10-23 ENCOUNTER — Other Ambulatory Visit: Payer: Self-pay

## 2020-10-23 ENCOUNTER — Ambulatory Visit (INDEPENDENT_AMBULATORY_CARE_PROVIDER_SITE_OTHER): Payer: Managed Care, Other (non HMO) | Admitting: Family Medicine

## 2020-10-23 VITALS — BP 122/70 | HR 96 | Temp 97.8°F | Resp 16 | Ht 67.0 in | Wt 249.0 lb

## 2020-10-23 DIAGNOSIS — E039 Hypothyroidism, unspecified: Secondary | ICD-10-CM

## 2020-10-23 DIAGNOSIS — E892 Postprocedural hypoparathyroidism: Secondary | ICD-10-CM

## 2020-10-23 DIAGNOSIS — Z23 Encounter for immunization: Secondary | ICD-10-CM

## 2020-10-23 DIAGNOSIS — E559 Vitamin D deficiency, unspecified: Secondary | ICD-10-CM | POA: Diagnosis not present

## 2020-10-23 DIAGNOSIS — I10 Essential (primary) hypertension: Secondary | ICD-10-CM | POA: Diagnosis not present

## 2020-10-23 DIAGNOSIS — G9389 Other specified disorders of brain: Secondary | ICD-10-CM

## 2020-10-23 DIAGNOSIS — E538 Deficiency of other specified B group vitamins: Secondary | ICD-10-CM

## 2020-10-23 DIAGNOSIS — Z0000 Encounter for general adult medical examination without abnormal findings: Secondary | ICD-10-CM | POA: Diagnosis not present

## 2020-10-23 DIAGNOSIS — E1129 Type 2 diabetes mellitus with other diabetic kidney complication: Secondary | ICD-10-CM

## 2020-10-23 DIAGNOSIS — Z794 Long term (current) use of insulin: Secondary | ICD-10-CM

## 2020-10-23 DIAGNOSIS — E785 Hyperlipidemia, unspecified: Secondary | ICD-10-CM

## 2020-10-23 DIAGNOSIS — R809 Proteinuria, unspecified: Secondary | ICD-10-CM

## 2020-10-23 DIAGNOSIS — E1169 Type 2 diabetes mellitus with other specified complication: Secondary | ICD-10-CM

## 2020-10-23 MED ORDER — VITAMIN D (ERGOCALCIFEROL) 1.25 MG (50000 UNIT) PO CAPS: 50000.0000 [IU] | ORAL_CAPSULE | ORAL | 1 refills | Status: DC

## 2020-10-23 MED ORDER — CYANOCOBALAMIN 1000 MCG/ML IJ SOLN
1000.0000 ug | Freq: Once | INTRAMUSCULAR | Status: AC
  Administered 2020-10-23: 1000 ug via INTRAMUSCULAR

## 2020-10-23 MED ORDER — LEVOTHYROXINE SODIUM 75 MCG PO TABS: 75.0000 ug | ORAL_TABLET | Freq: Every day | ORAL | 1 refills | Status: DC

## 2020-10-23 MED ORDER — DAPAGLIFLOZIN PROPANEDIOL 10 MG PO TABS: 10.0000 mg | ORAL_TABLET | Freq: Every day | ORAL | 1 refills | Status: DC

## 2020-10-23 MED ORDER — ROSUVASTATIN CALCIUM 40 MG PO TABS: 40.0000 mg | ORAL_TABLET | Freq: Every day | ORAL | 1 refills | Status: DC

## 2020-10-23 MED ORDER — AMLODIPINE BESY-BENAZEPRIL HCL 5-20 MG PO CAPS: 1.0000 | ORAL_CAPSULE | Freq: Every day | ORAL | 1 refills | Status: DC

## 2020-10-23 MED ORDER — OZEMPIC (1 MG/DOSE) 4 MG/3ML ~~LOC~~ SOPN: 1.0000 mg | PEN_INJECTOR | SUBCUTANEOUS | 1 refills | Status: DC

## 2020-10-23 NOTE — Progress Notes (Signed)
Name: Tabitha Shaw   MRN: [id number]    DOB: Mar 30, 1960   Date:10/23/2020       Progress Note  Subjective  Chief Complaint  Annual Exam  HPI  Patient presents for annual CPE and follow up  DMII : hgbA1C spiked to 8.1% back in August 2020 , last level was 6.6%    She states glucose at home has been 110-140    in the She is compliant  Ozempic and Metformin, but she wants to stop Metformin ( causes diarrhea) , we will try switching to [place]  She states her diet has not been as restrictive lately.  Denies polyphagia, polydipsia or polyuria, but she has nocturia once per night - stable. She has been taking benazepril for microalbuminuria but level was high at 259 . Last LDL was at goal at 64 .   HTN: denies  chest pain, palpitation or dizziness. Continue Lotrel 5/20.BP is at goal    Hyperlipidemia: she was taking Atorvastatin but LDL was high at 162 , so we switched to Crestor in 2020 and last LDL at goal, continue medications. No myalgia.    Hypothyroidism: she is taking Synthroid 28mg  daily , no change in bowel movement of dry skin, denies dysphagia , positive for weight loss . TSH normal    Morbid obesity: :she continues to lose weight, she states working from home, but takes breaks every hour to do something around the house, she is taking Ozempic and Metformin, she has been walking three days a week, weigth has been stable.    History of Primary  Hyperparathyroidism: seen by Dr. SGabriel Carinaand referred to Dr. KMaudie Mercury found to have a right lower parathyroid enlargement and had surgery on 11/26/2017, and has been released since, last Pth was normal. Denies cramps, GERD symptoms. 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 Last treatment was 12/2019 ,removal of 500 ml.  Memory difficulties: she is worried because daughter noticed she has been forgetful, there is a family history of Alzheimer's dementia, and her 23&Me test was positive. Discussed MMS, signs  and symptoms of Alzheimer's , she has been working on puzzles every night. She saw Dr. SManuella Ghazi she was diagnosed with mild cognitive dysfunction and OSA, incidental finding on MRI brain.    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, and will have repeat MRI in 6 months, no headaches   Diet: she will resume diabetic diet  Exercise: continue regular physical activity    [facility] 1      Depression: Phq 9 is  negative [facility] 10/23/2020 02/28/2020 11/22/2019 11/10/2019 09/19/2019  Decreased Interest 0 0 0 0 0  Down, Depressed, Hopeless 0 0 0 0 0  PHQ - 2 Score 0 0 0 0 0  Altered sleeping - - - -  0  Tired, decreased energy - - - - 0  Change in appetite - - - - 0  Feeling bad or failure about yourself  - - - - 0  Trouble concentrating - - - - 0  Moving slowly or fidgety/restless - - - - 0  Suicidal thoughts - - - - 0  PHQ-9 Score - - - - 0  Difficult doing work/chores - - - - -  Some recent data might be hidden   Hypertension: BP Readings from Last 3 Encounters:  10/23/20 122/70  07/15/20 107/77  07/15/20 123/85   Obesity: Wt Readings from Last 3 Encounters:  10/23/20 249 lb (112.9 kg)  07/15/20 250 lb 12.8 oz (113.8 kg)  04/23/20 251 lb (113.9 kg)   BMI Readings from Last 3 Encounters:  10/23/20 39.00 kg/m  07/15/20 39.28 kg/m  04/23/20 39.31 kg/m     Vaccines:    Shingrix: up to date   Pneumonia: up to date Flu: today   Hep C Screening: 09/05/18 STD testing and prevention (HIV/chl/gon/syphilis): 09/23/18 Intimate partner violence: negative Sexual History : not currently, husband diagnosed with prostate cancer  Menstrual History/LMP/Abnormal Bleeding: discussed post menopausal bleeding / s/ p supra cervical hysterectomy  Incontinence Symptoms: she has some urgency when she holds too long  Breast cancer:  - Last Mammogram: Scheduled for 10/30/20 - BRCA gene screening: N/A  Osteoporosis: Discussed high calcium and vitamin D supplementation, weight bearing exercises  Cervical cancer screening: 04/23/20  Skin cancer: Discussed monitoring for atypical lesions  Colorectal cancer: 02/16/20   Lung cancer:  Low Dose CT Chest recommended if Age 76-80 years, 20 pack-year currently smoking OR have quit w/in 15years. Patient does not qualify.   ECG: 05/29/09  Advanced Care Planning: A voluntary discussion about advance care planning including the explanation and discussion of advance directives.  Discussed health care proxy and Living will, and the patient was able to identify a health care proxy as husband   Lipids: Lab Results  Component Value Date   CHOL 129 10/21/2020   CHOL 135 08/26/2020   CHOL 139 05/12/2019   Lab Results  Component Value Date   HDL 41 10/21/2020   HDL 42 05/12/2019   HDL 42 05/12/2019   Lab Results  Component Value Date   LDLCALC 64 10/21/2020   LDLCALC 76 05/12/2019   LDLCALC 21 05/12/2019   Lab Results  Component Value Date   TRIG 139 10/21/2020   TRIG 197 (A) 08/26/2020   TRIG 115 05/12/2019   Lab Results  Component Value Date   CHOLHDL 3.1 10/21/2020   CHOLHDL 3.3 05/12/2019   CHOLHDL 6.0 (H) 09/05/2018   No results found for: LDLDIRECT  Glucose: Glucose  Date Value Ref Range Status  10/21/2020 142 (H) 70 - 99 mg/dL Final    Comment:                  **Please note reference interval change**  05/12/2019 123 (H) 65 - 99 mg/dL  Final  09/05/2018 174 (H) 65 - 99 mg/dL Final   Glucose-Capillary  Date Value Ref Range Status  02/16/2020 119 (H) 70 - 99 mg/dL Final    Comment:    Glucose reference range applies only to samples taken after fasting for at least 8 hours.  11/11/2018 122 (H) 70 - 99 mg/dL Final    Patient Active Problem List   Diagnosis Date Noted   S/P laparoscopic supracervical hysterectomy 04/23/2020   Dysplasia of cervix, low grade (CIN  1) 11/22/2019   ASCUS with positive high risk HPV cervical 10/24/2019   Personal history of colonic polyps    Polyp of colon    Cervical high risk HPV (human papillomavirus) test positive 09/03/2016   B12 deficiency 09/04/2015   Elevated PTHrP level 09/04/2015   Benign hypertension 09/12/2014   Acanthosis nigricans 09/09/2014   Diabetes mellitus with renal manifestation ([place]) 09/09/2014   Dyslipidemia 09/09/2014   Abnormal electrocardiogram 09/09/2014   Enlarged kidney 09/09/2014   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[place]) 09/09/2014   Enlarged liver 09/09/2014   Calcium blood increased 09/09/2014   Adult hypothyroidism 09/09/2014   Microalbuminuria 09/09/2014   Morbid obesity due to excess calories ([place]) 09/09/2014   Spinal stenosis of lumbar region with neurogenic claudication 09/09/2014   Osteopenia 09/09/2014   Vitamin D deficiency 09/09/2014    Past Surgical History:  Procedure Laterality Date   ABDOMINAL HYSTERECTOMY  2005   blood transfusion yearly     BREAST REDUCTION SURGERY     CESAREAN [place]   COLONOSCOPY WITH PROPOFOL N/A 11/11/2018   Procedure: COLONOSCOPY WITH PROPOFOL;  Surgeon: Virgel Manifold, MD;  Location: [facility] ENDOSCOPY;  Service: Endoscopy;  Laterality: N/A;   COLONOSCOPY WITH PROPOFOL N/A 02/16/2020   Procedure: COLONOSCOPY WITH PROPOFOL;  Surgeon: Virgel Manifold, MD;  Location: [facility] ENDOSCOPY;  Service: Endoscopy;  Laterality: N/A;   lapcholecystectomy      PARATHYROIDECTOMY Right 11/26/2017   right lower , Dr. Maudie Mercury at [place] W/ [facility]      Family History  Problem Relation Age of Onset   Colon polyps Mother    Hemachromatosis Mother    Hypertension Mother    Depression Sister    Hypertension Brother    Colon polyps Brother    Cancer Father    Breast cancer Cousin    Breast cancer Cousin     Social History   Socioeconomic History   Marital status: Married    Spouse name: John    Number of children: 1   Years of education: Not on file   Highest education level: Some college, no degree  Occupational History   Occupation: customer services     Comment: [employer]   Tobacco Use   Smoking status: Former    Packs/day: 0.25    Years: 8.00    Pack years: 2.00    Types: Cigarettes    Start date: 01/19/1982    Quit date: 01/19/1990    Years since quitting: 30.7   Smokeless tobacco: Never  Vaping Use   Vaping Use: Never used  Substance and Sexual Activity   Alcohol use: Yes    Alcohol/week: 0.0 standard drinks    Comment: occasionally   Drug use: No   Sexual activity: Yes    Partners: Male    Birth control/protection: Surgical  Other Topics Concern   Not on file  Social History Narrative   Married; works  full time   Her daughter, [name] is a travel Marine scientist    Social Determinants of Radio broadcast assistant Strain: Low Risk    Difficulty of Paying Living Expenses: Not hard at all  Food Insecurity: No Food Insecurity   Worried About Charity fundraiser in the Last Year: Never true   Arboriculturist in the Last Year: Never true  Transportation Needs: No Transportation Needs   Lack of Transportation (  Medical): No   Lack of Transportation (Non-Medical): No  Physical Activity: Insufficiently Active   Days of Exercise per Week: 2 days   Minutes of Exercise per Session: 30 min  Stress: No Stress Concern Present   Feeling of Stress : Only a little  Social  Connections: Engineer, building services of Communication with Friends and Family: Three times a week   Frequency of Social Gatherings with Friends and Family: Twice a week   Attends Religious Services: More than 4 times per year   Active Member of [employer] or Organizations: Yes   Attends Archivist Meetings: 1 to 4 times per year   Marital Status: Married  Human resources officer Violence: Not At Risk   Fear of Current or Ex-Partner: No   Emotionally Abused: No   Physically Abused: No   Sexually Abused: No     Current Outpatient Medications:    blood glucose meter kit and supplies, Dispense based on patient and insurance preference. Use up to four times daily as directed. (FOR ICD-10 E10.9, E11.9)., Disp: 1 each, Rfl: 0   dapagliflozin propanediol (FARXIGA) 10 MG TABS tablet, Take 1 tablet (10 mg total) by mouth daily before breakfast., Disp: 90 tablet, Rfl: 1   Eflornithine HCl (VANIQA) 13.9 % cream, Apply to affected areas of face BID., Disp: 45 g, Rfl: 3   ketoconazole (NIZORAL) 2 % shampoo, Apply topically 2 (two) times a week., Disp: 120 mL, Rfl: 2   ONE TOUCH ULTRA TEST test strip, CHECK FASTING BLOOD SUGAR  TWICE WEEKLY, Disp: 100 each, Rfl: 1   amLODipine-benazepril (LOTREL) 5-20 MG capsule, Take 1 capsule by mouth daily., Disp: 90 capsule, Rfl: 1   levothyroxine (SYNTHROID) 75 MCG tablet, Take 1 tablet (75 mcg total) by mouth daily., Disp: 90 tablet, Rfl: 1   rosuvastatin (CRESTOR) 40 MG tablet, Take 1 tablet (40 mg total) by mouth daily., Disp: 90 tablet, Rfl: 1   Semaglutide, 1 MG/DOSE, (OZEMPIC, 1 MG/DOSE,) 4 MG/3ML SOPN, Inject 1 mg into the skin once a week., Disp: 9 mL, Rfl: 1   Vitamin D, Ergocalciferol, (DRISDOL) 1.25 MG (50000 UNIT) CAPS capsule, Take 1 capsule (50,000 Units total) by mouth every 7 (seven) days., Disp: 12 capsule, Rfl: 1  Current Facility-Administered Medications:    cyanocobalamin ((VITAMIN B-12)) injection 1,000 mcg, 1,000 mcg, Intramuscular,  Once, Sowles, Drue Stager, MD  No Known Allergies   ROS  Constitutional: Negative for fever or weight change.  Respiratory: Negative for cough and shortness of breath.   Cardiovascular: Negative for chest pain or palpitations.  Gastrointestinal: Negative for abdominal pain, no bowel changes.  Musculoskeletal: Negative for gait problem or joint swelling.  Skin: Negative for rash.  Neurological: Negative for dizziness or headache.  No other specific complaints in a complete review of systems (except as listed in HPI above).   Objective  Vitals:   10/23/20 1316  BP: 122/70  Pulse: 96  Resp: 16  Temp: 97.8 F (36.6 C)  SpO2: 100%  Weight: 249 lb (112.9 kg)  Height: 5' 7" (1.702 m)    Body mass index is 39 kg/m.  Physical Exam  Constitutional: Patient appears well-developed and well-nourished. No distress.  HENT: Head: Normocephalic and atraumatic. Ears: B TMs ok, no erythema or effusion; Nose: Nose normal. Mouth/Throat: not done  Eyes: Conjunctivae and EOM are normal. Pupils are equal, round, and reactive to light. No scleral icterus.  Neck: Normal range of motion. Neck supple. No JVD present. No thyromegaly present.  Cardiovascular:  Normal rate, regular rhythm and normal heart sounds.  No murmur heard. No BLE edema. Pulmonary/Chest: Effort normal and breath sounds normal. No respiratory distress. Abdominal: Soft. Bowel sounds are normal, no distension. There is no tenderness. no masses Breast: no lumps or masses, no nipple discharge or rashes FEMALE GENITALIA:  Not done  RECTAL: not done  Musculoskeletal: Normal range of motion, no joint effusions. No gross deformities Neurological: he is alert and oriented to person, place, and time. No cranial nerve deficit. Coordination, balance, strength, speech and gait are normal.  Skin: Skin is warm and dry. No rash noted. No erythema.  Psychiatric: Patient has a normal mood and affect. behavior is normal. Judgment and thought content  normal.    Recent Results (from the past 2160 hour(s))  Basic metabolic panel     Status: None   Collection Time: 08/26/20 12:00 AM  Result Value Ref Range   Glucose 140   Lipid panel     Status: Abnormal   Collection Time: 08/26/20 12:00 AM  Result Value Ref Range   Triglycerides 197 (A) 40 - 160   Cholesterol 135 0 - 200  Parathyroid hormone, intact (no Ca)     Status: None   Collection Time: 10/21/20  7:11 AM  Result Value Ref Range   PTH 32 15 - 65 pg/mL  Lipid panel     Status: None   Collection Time: 10/21/20  7:11 AM  Result Value Ref Range   Cholesterol, Total 129 100 - 199 mg/dL   Triglycerides 139 0 - 149 mg/dL   HDL 41 >39 mg/dL   VLDL Cholesterol Cal 24 5 - 40 mg/dL   LDL Chol Calc (NIH) 64 0 - 99 mg/dL   Chol/HDL Ratio 3.1 0.0 - 4.4 ratio    Comment:                                   T. Chol/HDL Ratio                                             Men  Women                               1/2 Avg.Risk  3.4    3.3                                   Avg.Risk  5.0    4.4                                2X Avg.Risk  9.6    7.1                                3X Avg.Risk 23.4   11.0   Microalbumin / creatinine urine ratio     Status: Abnormal   Collection Time: 10/21/20  7:11 AM  Result Value Ref Range   Creatinine, Urine 98.9 Not Estab. mg/dL   Microalbumin, Urine 255.8 Not Estab. ug/mL   Microalb/Creat Ratio 259 (H) 0 - 29 mg/g creat  Comment:                        Normal:                0 -  29                        Moderately increased: 30 - 300                        Severely increased:       >300   Comprehensive metabolic panel     Status: Abnormal   Collection Time: 10/21/20  7:11 AM  Result Value Ref Range   Glucose 142 (H) 70 - 99 mg/dL    Comment:               **Please note reference interval change**   BUN 11 6 - 24 mg/dL   Creatinine, Ser 0.95 0.57 - 1.00 mg/dL   eGFR 69 >59 mL/min/1.73   BUN/Creatinine Ratio 12 9 - 23   Sodium 142 134 - 144  mmol/L   Potassium 4.0 3.5 - 5.2 mmol/L   Chloride 102 96 - 106 mmol/L   CO2 21 20 - 29 mmol/L   Calcium 9.7 8.7 - 10.2 mg/dL   Total Protein 7.2 6.0 - 8.5 g/dL   Albumin 4.5 3.8 - 4.9 g/dL   Globulin, Total 2.7 1.5 - 4.5 g/dL   Albumin/Globulin Ratio 1.7 1.2 - 2.2   Bilirubin Total 0.7 0.0 - 1.2 mg/dL   Alkaline Phosphatase 76 44 - 121 IU/L   AST 27 0 - 40 IU/L   ALT 25 0 - 32 IU/L  TSH     Status: None   Collection Time: 10/21/20  7:11 AM  Result Value Ref Range   TSH 0.737 0.450 - 4.500 uIU/mL  Hemoglobin A1c     Status: Abnormal   Collection Time: 10/21/20  7:11 AM  Result Value Ref Range   Hgb A1c MFr Bld 6.6 (H) 4.8 - 5.6 %    Comment:          Prediabetes: 5.7 - 6.4          Diabetes: >6.4          Glycemic control for adults with diabetes: <7.0    Est. average glucose Bld gHb Est-mCnc 143 mg/dL  VITAMIN D 25 Hydroxy (Vit-D Deficiency, Fractures)     Status: Abnormal   Collection Time: 10/21/20  7:11 AM  Result Value Ref Range   Vit D, 25-Hydroxy 102.0 (H) 30.0 - 100.0 ng/mL    Comment: Vitamin D deficiency has been defined by the Institute of Medicine and an Endocrine Society practice guideline as a level of serum 25-OH vitamin D less than 20 ng/mL (1,2). The Endocrine Society went on to further define vitamin D insufficiency as a level between 21 and 29 ng/mL (2). 1. IOM (Institute of Medicine). 2010. Dietary reference    intakes for calcium and D. [place]: The    [employer]. 2. Holick MF, Binkley , Bischoff-Ferrari HA, et al.    Evaluation, treatment, and prevention of vitamin D    deficiency: an Endocrine Society clinical practice    guideline. JCEM. 2011 Jul; 96(7):1911-30.   Vitamin B12     Status: None   Collection Time: 10/21/20  7:11 AM  Result Value Ref Range   Vitamin B-12 244 232 -  1,245 pg/mL     Fall Risk: Fall Risk  10/23/2020 02/28/2020 11/22/2019 11/10/2019 09/19/2019  Falls in the past year? 0 0 0 0 0  Number falls in past  yr: 0 0 0 0 0  Injury with Fall? 0 0 0 0 0  Risk for fall due to : No Fall Risks - - - -  Follow up Falls prevention discussed - - - -     Functional Status Survey: Is the patient deaf or have difficulty hearing?: No Does the patient have difficulty seeing, even when wearing glasses/contacts?: No Does the patient have difficulty concentrating, remembering, or making decisions?: No Does the patient have difficulty walking or climbing stairs?: No Does the patient have difficulty dressing or bathing?: No Does the patient have difficulty doing errands alone such as visiting a doctor's office or shopping?: No   Assessment & Plan  1. Well adult exam   2. Need for immunization against influenza  - Flu Vaccine QUAD 9moIM (Fluarix, Fluzone & Alfiuria Quad PF)  3. Vitamin D deficiency  - Vitamin D, Ergocalciferol, (DRISDOL) 1.25 MG (50000 UNIT) CAPS capsule; Take 1 capsule (50,000 Units total) by mouth every 7 (seven) days.  Dispense: 12 capsule; Refill: 1  4. Benign hypertension  - amLODipine-benazepril (LOTREL) 5-20 MG capsule; Take 1 capsule by mouth daily.  Dispense: 90 capsule; Refill: 1  5. Adult hypothyroidism  - levothyroxine (SYNTHROID) 75 MCG tablet; Take 1 tablet (75 mcg total) by mouth daily.  Dispense: 90 tablet; Refill: 1  6. Type 2 diabetes mellitus with microalbuminuria, with long-term current use of insulin (HCC)  - Semaglutide, 1 MG/DOSE, (OZEMPIC, 1 MG/DOSE,) 4 MG/3ML SOPN; Inject 1 mg into the skin once a week.  Dispense: 9 mL; Refill: 1 - dapagliflozin propanediol (FARXIGA) 10 MG TABS tablet; Take 1 tablet (10 mg total) by mouth daily before breakfast.  Dispense: 90 tablet; Refill: 1  7. [place] of [place] ([place]   8. 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[place]   9. B12 deficiency  B12 injection   10. Dyslipidemia due to type 2 diabetes mellitus (HCC)  - rosuvastatin (CRESTOR) 40 MG tablet; Take 1 tablet (40 mg total) by mouth daily.  Dispense: 90 tablet;  Refill: 1  11. Brain mass      -USPSTF grade A and B recommendations reviewed with patient; age-appropriate recommendations, preventive care, screening tests, etc discussed and encouraged; healthy living encouraged; see AVS for patient education given to patient -Discussed importance of 150 minutes of physical activity weekly, eat two servings of fish weekly, eat one serving of tree nuts ( cashews, pistachios, pecans, almonds..Marland Kitchen every other day, eat 6 servings of fruit/vegetables daily and drink plenty of water and avoid sweet beverages.

## 2020-10-23 NOTE — Patient Instructions (Signed)
Preventive Care 40-58 Years Old, Female Preventive care refers to lifestyle choices and visits with your health care provider that can promote health and wellness. This includes: A yearly physical exam. This is also called an annual wellness visit. Regular dental and eye exams. Immunizations. Screening for certain conditions. Healthy lifestyle choices, such as: Eating a healthy diet. Getting regular exercise. Not using drugs or products that contain nicotine and tobacco. Limiting alcohol use. What can I expect for my preventive care visit? Physical exam Your health care provider will check your: Height and weight. These may be used to calculate your BMI (body mass index). BMI is a measurement that tells if you are at a healthy weight. Heart rate and blood pressure. Body temperature. Skin for abnormal spots. Counseling Your health care provider may ask you questions about your: Past medical problems. Family's medical history. Alcohol, tobacco, and drug use. Emotional well-being. Home life and relationship well-being. Sexual activity. Diet, exercise, and sleep habits. Work and work environment. Access to firearms. Method of birth control. Menstrual cycle. Pregnancy history. What immunizations do I need? Vaccines are usually given at various ages, according to a schedule. Your health care provider will recommend vaccines for you based on your age, medical history, and lifestyle or other factors, such as travel or where you work. What tests do I need? Blood tests Lipid and cholesterol levels. These may be checked every 5 years, or more often if you are over 50 years old. Hepatitis C test. Hepatitis B test. Screening Lung cancer screening. You may have this screening every year starting at age 55 if you have a 30-pack-year history of smoking and currently smoke or have quit within the past 15 years. Colorectal cancer screening. All adults should have this screening starting at  age 50 and continuing until age 75. Your health care provider may recommend screening at age 45 if you are at increased risk. You will have tests every 1-10 years, depending on your results and the type of screening test. Diabetes screening. This is done by checking your blood sugar (glucose) after you have not eaten for a while (fasting). You may have this done every 1-3 years. Mammogram. This may be done every 1-2 years. Talk with your health care provider about when you should start having regular mammograms. This may depend on whether you have a family history of breast cancer. BRCA-related cancer screening. This may be done if you have a family history of breast, ovarian, tubal, or peritoneal cancers. Pelvic exam and Pap test. This may be done every 3 years starting at age 21. Starting at age 30, this may be done every 5 years if you have a Pap test in combination with an HPV test. Other tests STD (sexually transmitted disease) testing, if you are at risk. Bone density scan. This is done to screen for osteoporosis. You may have this scan if you are at high risk for osteoporosis. Talk with your health care provider about your test results, treatment options, and if necessary, the need for more tests. Follow these instructions at home: Eating and drinking  Eat a diet that includes fresh fruits and vegetables, whole grains, lean protein, and low-fat dairy products. Take vitamin and mineral supplements as recommended by your health care provider. Do not drink alcohol if: Your health care provider tells you not to drink. You are pregnant, may be pregnant, or are planning to become pregnant. If you drink alcohol: Limit how much you have to 0-1 drink a day. Be   aware of how much alcohol is in your drink. In the [place] one drink equals one 12 oz bottle of beer (355 mL), one 5 oz glass of wine (148 mL), or one 1 oz glass of hard liquor (44 mL). Lifestyle Take daily care of your teeth and  gums. Brush your teeth every morning and night with fluoride toothpaste. Floss one time each day. Stay active. Exercise for at least 30 minutes 5 or more days each week. Do not use any products that contain nicotine or tobacco, such as cigarettes, e-cigarettes, and chewing tobacco. If you need help quitting, ask your health care provider. Do not use drugs. If you are sexually active, practice safe sex. Use a condom or other form of protection to prevent STIs (sexually transmitted infections). If you do not wish to become pregnant, use a form of birth control. If you plan to become pregnant, see your health care provider for a prepregnancy visit. If told by your health care provider, take low-dose aspirin daily starting at age 63. Find healthy ways to cope with stress, such as: Meditation, yoga, or listening to music. Journaling. Talking to a trusted person. Spending time with friends and family. Safety Always wear your seat belt while driving or riding in a vehicle. Do not drive: If you have been drinking alcohol. Do not ride with someone who has been drinking. When you are tired or distracted. While texting. Wear a helmet and other protective equipment during sports activities. If you have firearms in your house, make sure you follow all gun safety procedures. What's next? Visit your health care provider once a year for an annual wellness visit. Ask your health care provider how often you should have your eyes and teeth checked. Stay up to date on all vaccines. This information is not intended to replace advice given to you by your health care provider. Make sure you discuss any questions you have with your health care provider. Document Revised: 03/15/2020 Document Reviewed: 09/16/2017 Elsevier Patient Education  2022 [employer].

## 2020-10-30 ENCOUNTER — Ambulatory Visit
Admission: RE | Admit: 2020-10-30 | Discharge: 2020-10-30 | Disposition: A | Discharge status: Home / Self Care | Payer: Managed Care, Other (non HMO) | Source: Ambulatory Visit | Attending: Family Medicine | Admitting: Family Medicine

## 2020-10-30 ENCOUNTER — Other Ambulatory Visit: Payer: Self-pay

## 2020-10-30 DIAGNOSIS — Z1231 Encounter for screening mammogram for malignant neoplasm of breast: Secondary | ICD-10-CM | POA: Diagnosis not present

## 2020-10-30 IMAGING — MG MM DIGITAL SCREENING BILAT W/ TOMO AND CAD
6 of 10 series · 6 of 30 positions shown · non-contrast
Comparison: Previous exam(s).

ACR Breast Density Category a: The breast tissue is almost entirely
fatty.

CLINICAL DATA: Screening.

EXAM:
DIGITAL SCREENING BILATERAL MAMMOGRAM WITH TOMOSYNTHESIS AND CAD
TECHNIQUE: Bilateral screening digital craniocaudal and mediolateral oblique
mammograms were obtained. Bilateral screening digital breast
tomosynthesis was performed. The images were evaluated with
computer-aided detection.

[L MLO synth-2D]
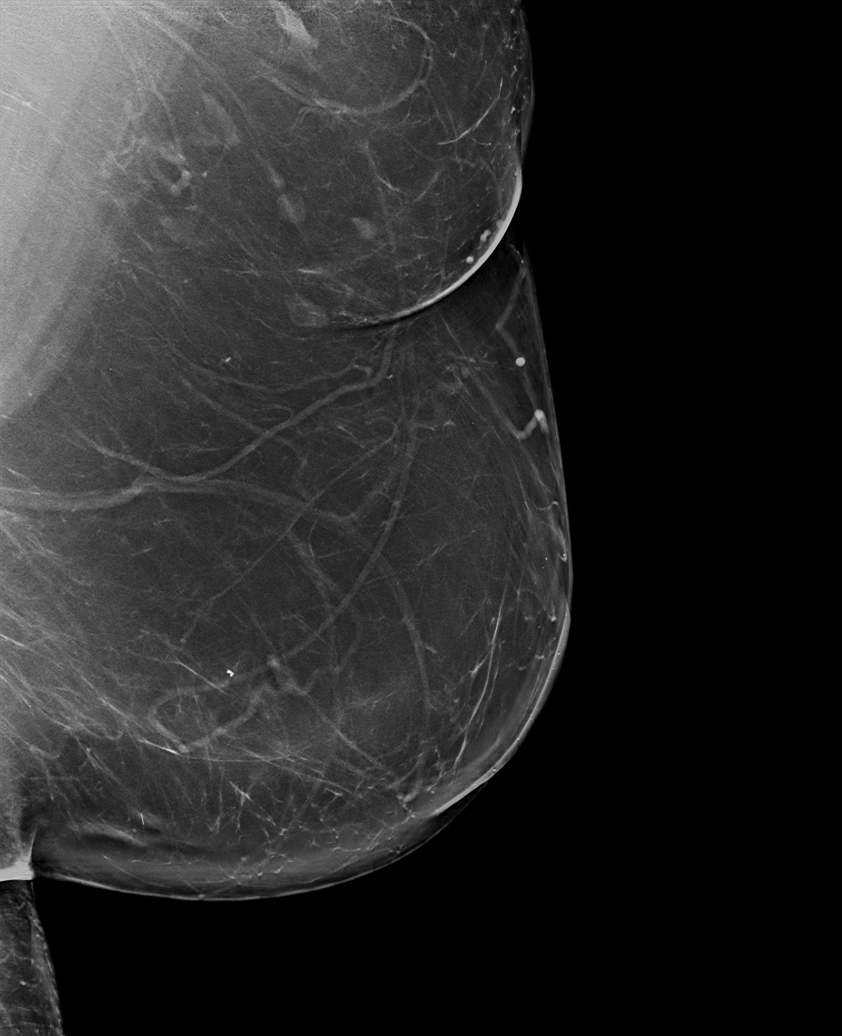

[R MLO synth-2D]
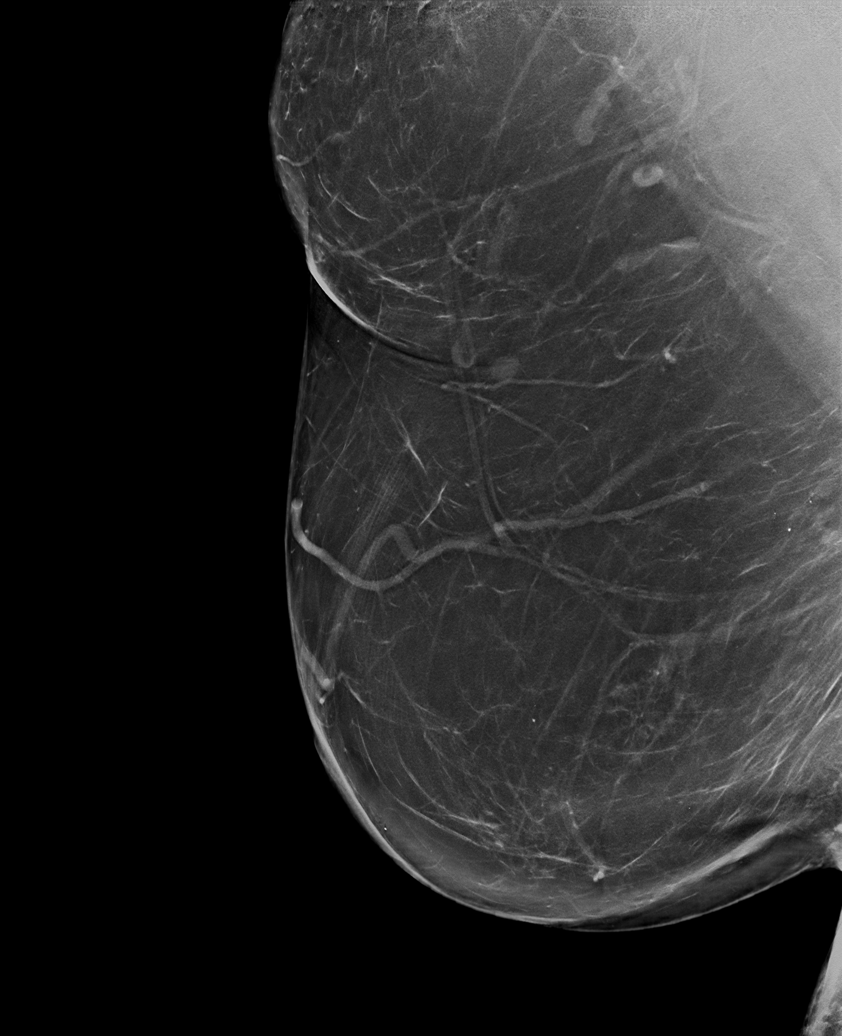

[R CC synth-2D (1 of 2)]
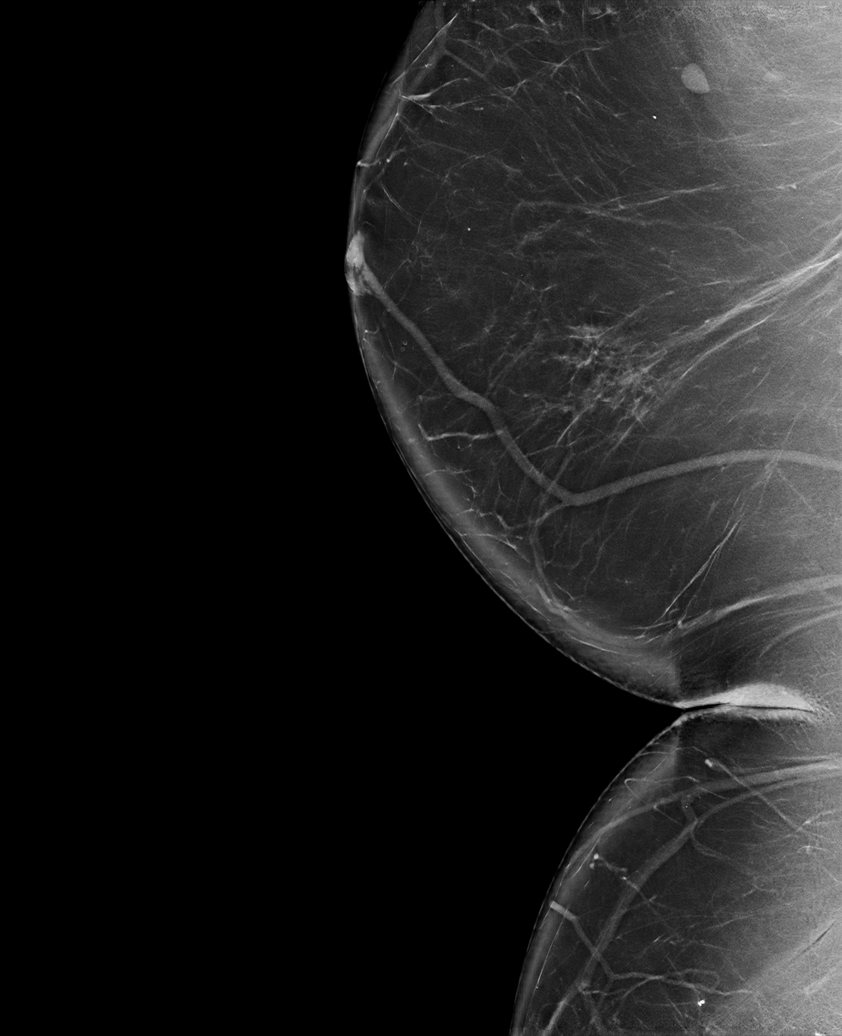

[L CC synth-2D]
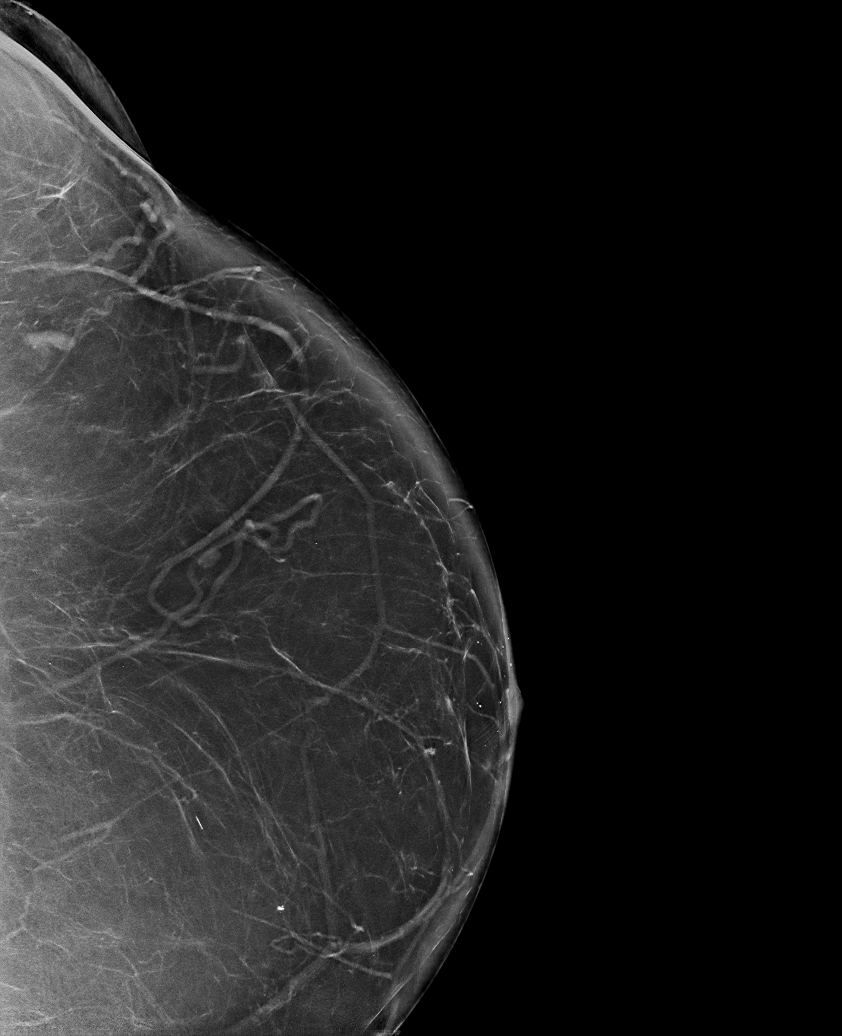

[R CC synth-2D (2 of 2)]
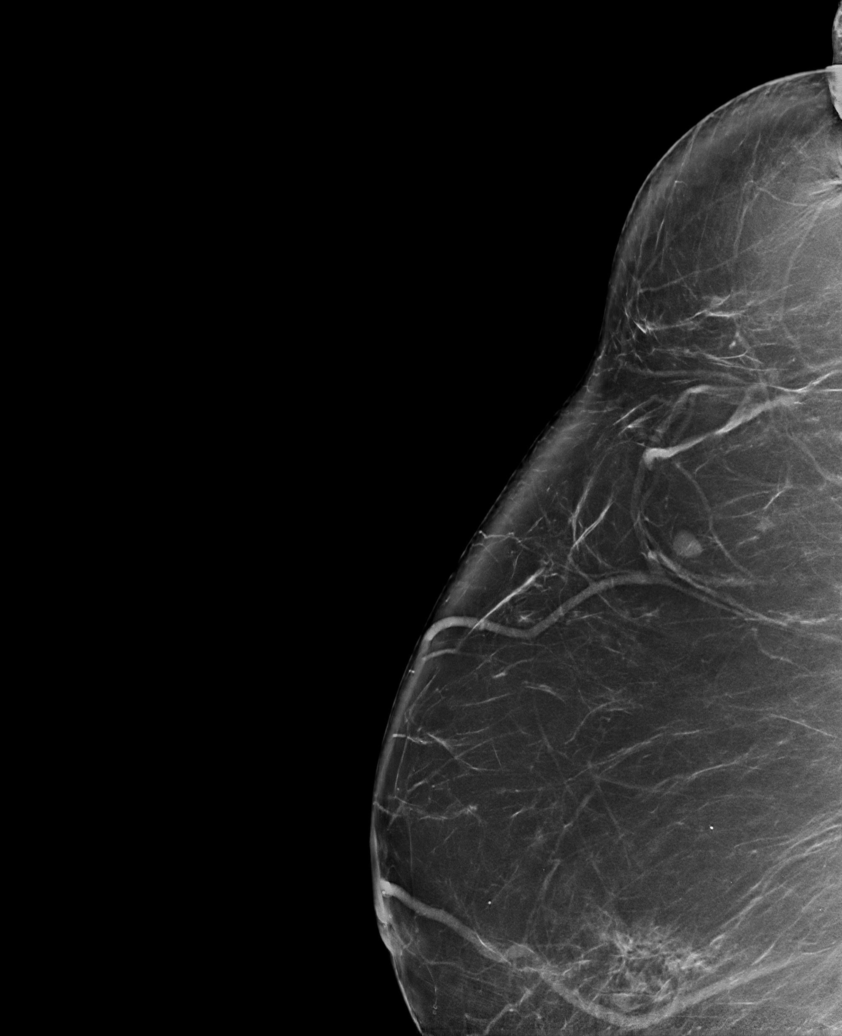

[L CC tomo · tomo slice 47/93.0]
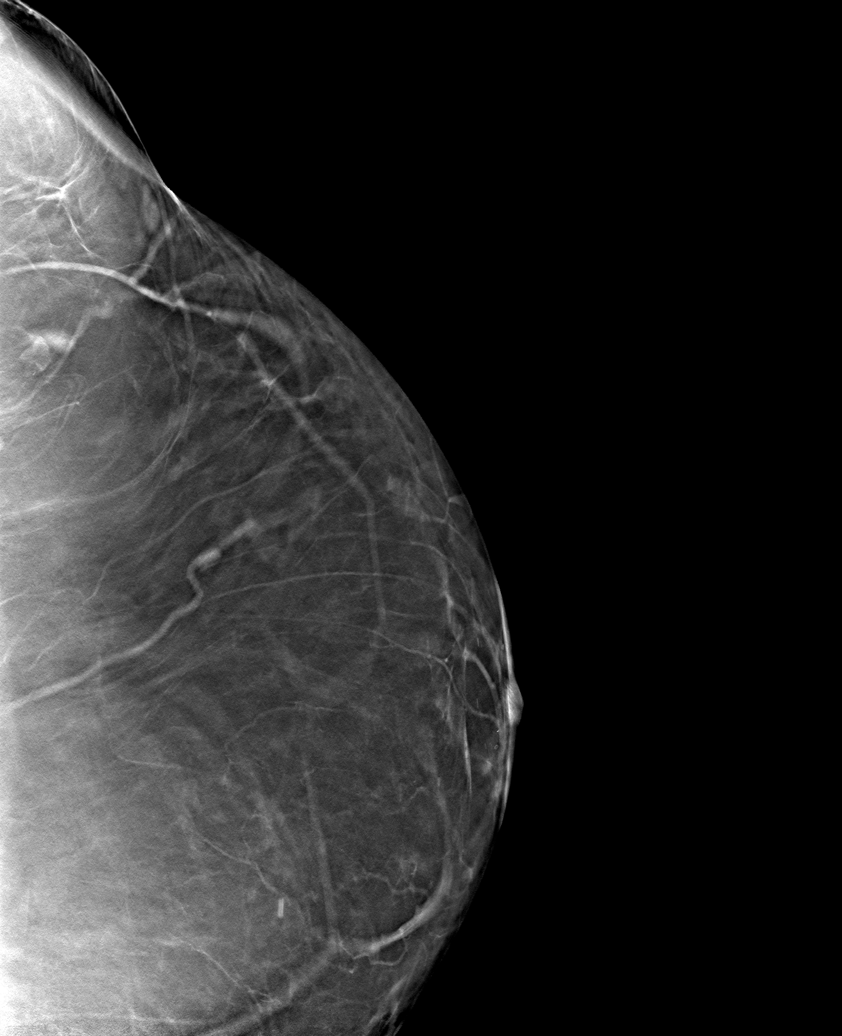

[6 of 30 positions shown; findings below may reference images not displayed]

FINDINGS: There are no findings suspicious for malignancy.
IMPRESSION: No mammographic evidence of malignancy. A result letter of this
screening mammogram will be mailed directly to the patient.

RECOMMENDATION:
Screening mammogram in one year. (Code:[8U])

BI-RADS CATEGORY  1: Negative.

## 2020-11-21 ENCOUNTER — Encounter: Payer: Self-pay | Admitting: Family Medicine

## 2020-11-29 ENCOUNTER — Ambulatory Visit (INDEPENDENT_AMBULATORY_CARE_PROVIDER_SITE_OTHER): Payer: Managed Care, Other (non HMO)

## 2020-11-29 ENCOUNTER — Other Ambulatory Visit: Payer: Self-pay

## 2020-11-29 DIAGNOSIS — E538 Deficiency of other specified B group vitamins: Secondary | ICD-10-CM

## 2020-11-29 MED ORDER — CYANOCOBALAMIN 1000 MCG/ML IJ SOLN
1000.0000 ug | Freq: Once | INTRAMUSCULAR | Status: AC
  Administered 2020-11-29: 1000 ug via INTRAMUSCULAR

## 2020-12-25 ENCOUNTER — Other Ambulatory Visit: Payer: Self-pay | Admitting: Dermatology

## 2020-12-25 ENCOUNTER — Ambulatory Visit: Payer: Managed Care, Other (non HMO) | Admitting: Dermatology

## 2020-12-25 ENCOUNTER — Telehealth: Payer: Self-pay

## 2020-12-25 ENCOUNTER — Other Ambulatory Visit: Payer: Self-pay

## 2020-12-25 DIAGNOSIS — L680 Hirsutism: Secondary | ICD-10-CM

## 2020-12-25 DIAGNOSIS — B354 Tinea corporis: Secondary | ICD-10-CM | POA: Diagnosis not present

## 2020-12-25 DIAGNOSIS — L689 Hypertrichosis, unspecified: Secondary | ICD-10-CM

## 2020-12-25 DIAGNOSIS — L650 Telogen effluvium: Secondary | ICD-10-CM | POA: Diagnosis not present

## 2020-12-25 DIAGNOSIS — L659 Nonscarring hair loss, unspecified: Secondary | ICD-10-CM

## 2020-12-25 DIAGNOSIS — L811 Chloasma: Secondary | ICD-10-CM | POA: Diagnosis not present

## 2020-12-25 MED ORDER — SPIRONOLACTONE 100 MG PO TABS: 100.0000 mg | ORAL_TABLET | Freq: Every day | ORAL | 1 refills | Status: DC

## 2020-12-25 MED ORDER — VANIQA 13.9 % EX CREA: TOPICAL_CREAM | CUTANEOUS | 3 refills | Status: DC

## 2020-12-25 MED ORDER — KETOCONAZOLE 2 % EX CREA: 1.0000 "application " | TOPICAL_CREAM | Freq: Two times a day (BID) | CUTANEOUS | 4 refills | Status: AC

## 2020-12-25 MED ORDER — FINASTERIDE 5 MG PO TABS
ORAL_TABLET | ORAL | 3 refills | Status: DC
Start: 2020-12-25 — End: 2021-07-18

## 2020-12-25 NOTE — Telephone Encounter (Signed)
Pharmacy sent message regarding spironolactone rx stating that pt is also taking amlodipine-benazepril 5-20 mg from a different prescriber. Please advise.

## 2020-12-25 NOTE — Progress Notes (Signed)
Follow-Up Visit   Subjective  Tabitha Shaw is a 60 y.o. female who presents for the following: Follow-up (Patient here today for 4 month melasma follow up . Patient is also concerning with facial hair and hair thinning. Patient has noticed some red areas under left breast. ).  The following portions of the chart were reviewed this encounter and updated as appropriate:  Tobacco  Allergies  Meds  Problems  Med Hx  Surg Hx  Fam Hx      Review of Systems: No other skin or systemic complaints except as noted in HPI or Assessment and Plan.   Objective  Well appearing patient in no apparent distress; mood and affect are within normal limits.  A focused examination was performed including scalp, face, chin, left abdomen. Relevant physical exam findings are noted in the Assessment and Plan.  Scalp Diffuse thinning  face Reticulated hyperpigmented patches.   left adomen Thin annular erythematous plaque   chin Terminal hair at lower face  Assessment & Plan  Alopecia Scalp  Some improvement when compared to prior photos at exam  Chronic condition with duration or expected duration over one year. Condition is bothersome to patient. Not currently at goal.  Age related hair loss and telogen effluvium - patient does have thyroid disease and PCOS   Patient defers starting Rogaine due to possible hair growth at face  Continue finisteride 5 mg tablet -  as instructed take 1/2 tablet by mouth daily   No h/o of breast cancer Bp today 111/80  Start spironolactone 100 mg -  1 tablet by mouth nightly   Spironolactone can cause increased urination and cause blood pressure to decrease. Please watch for signs of lightheadedness and be cautious when changing position. It can sometimes cause breast tenderness or an irregular period in premenopausal women. It can also increase potassium. The increase in potassium usually is not a concern unless you are taking other medicines that  also increase potassium, so please be sure your doctor knows all of the other medications you are taking. This medication should not be taken by pregnant women.  This medicine should also not be taken together with sulfa drugs like Bactrim (trimethoprim/sulfamethexazole).   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  Can use OTC Rogaine (minoxidil) 5% solution/foam as directed. Instructions given.     spironolactone (ALDACTONE) 100 MG tablet - Scalp Take 1 tablet (100 mg total) by mouth daily.  Potassium - Scalp  finasteride (PROSCAR) 5 MG tablet - Scalp Take 1/2 tablet by mouth daily  Melasma face  Chronic condition with duration or expected duration over one year. Condition is bothersome to patient. Not currently at goal.  Restart in January [facility] Hydroquinone 12%/kojic acid/vitamin C cream pea sized amount twice daily to the entire face for up to 3 months. This cannot be used more than 3 months due to risk of exogenous ochronosis (permanent dark spots). The patient was advised this is not covered by insurance since it is made by a compounding pharmacy. They will receive an email to check out and the medication will be mailed to their home.   Continue The Perfect A (tretinoin 0.1% cream with vitamin C) nightly followed by moisturizer.   Continue tinted mineral spf  Recommend Heliocare in sunny months   Consider adding tranexamic acid cream nightly at follow-up  Tinea corporis left adomen  Start ketoconazole 2 % cream - apply twice daily to affected areas until clear. Then use 1 additional week after clear.   Call if not clearing   ketoconazole (NIZORAL) 2 % cream - left adomen Apply 1 application topically 2 (two) times daily.  Until clear. Then use 1 additional week after clear. 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 Continue Eflornithine HCl (VANIQA) 13.9 % cream. Apply to affected areas of face BID.  Start spironolactone as above  Eflornithine HCl (VANIQA) 13.9 % cream - chin Apply to affected areas of face BID.  Return for 2 week nurse visit check bp, 3 month follow up on melasma . I, Ruthell Rummage, CMA, am acting as scribe for Forest Gleason, MD.  Documentation: I have reviewed the above documentation for accuracy and completeness, and I agree with the above.  Forest Gleason, MD

## 2020-12-25 NOTE — Patient Instructions (Addendum)
Restart  prescribe [facility]inals Hydroquinone 12%/kojic acid/vitamin C cream pea sized amount twice daily to the entire face for up to 3 months. This cannot be used more than 3 months due to risk of exogenous ochronosis (permanent dark spots). The patient was advised this is not covered by insurance since it is made by a compounding pharmacy. They will receive an email to check out and the medication will be mailed to their home.    Continue The Perfect A (tretinoin 0.1% cream with vitamin C) nightly followed by moisturizer. Sold here   Recommend over the counter (sephora )The Inkey Tranexamic Acid at night    Spironolactone can cause increased urination and cause blood pressure to decrease. Please watch for signs of lightheadedness and be cautious when changing position. It can sometimes cause breast tenderness or an irregular period in premenopausal women. It can also increase potassium. The increase in potassium usually is not a concern unless you are taking other medicines that also increase potassium, so please be sure your doctor knows all of the other medications you are taking. This medication should not be taken by pregnant women.  This medicine should also not be taken together with sulfa drugs like Bactrim (trimethoprim/sulfamethexazole).              If You Need Anything After Your Visit  If you have any questions or concerns for your doctor, please call our main line at [phone] and press option 4 to reach your doctor's medical assistant. If no one answers, please leave a voicemail as directed and we will return your call as soon as possible. Messages left after 4 pm will be answered the following business day.   You may also send [place] a message via [place]. We typically respond to MyChart messages within 1-2 business days.  For prescription refills, please ask your pharmacy to contact our office. Our fax number is [phone].  If you have an urgent issue when the  clinic is closed that cannot wait until the next business day, you can page your doctor at the number below.    Please note that while we do our best to be available for urgent issues outside of office hours, we are not available 24/7.   If you have an urgent issue and are unable to reach [place], you may choose to seek medical care at your doctor's office, retail clinic, urgent care center, or emergency room.  If you have a medical emergency, please immediately call 911 or go to the emergency department.  Pager Numbers  - Dr. Nehemiah Massed: [phone]  - Dr. Laurence Ferrari: [phone]  - Dr. Nicole Kindred: [phone]  In the event of inclement weather, please call our main line at [phone] for an update on the status of any delays or closures.  Dermatology Medication Tips: Please keep the boxes that topical medications come in in order to help keep track of the instructions about where and how to use these. Pharmacies typically print the medication instructions only on the boxes and not directly on the medication tubes.   If your medication is too expensive, please contact our office at [phone] option 4 or send [place] a message through [place].   We are unable to tell what your co-pay for medications will be in advance as this is different depending on your insurance coverage. However, we may be able to find a substitute medication at lower cost or fill out paperwork to get insurance to cover a needed medication.   If a  prior authorization is required to get your medication covered by your insurance company, please allow us 1-2 business days to complete this process.  Drug prices often vary depending on where the prescription is filled and some pharmacies may offer cheaper prices.  The website www.goodrx.com contains coupons for medications through different pharmacies. The prices here do not account for what the cost may be with help from insurance (it may be cheaper with your insurance), but the  website can give you the price if you did not use any insurance.  - You can print the associated coupon and take it with your prescription to the pharmacy.  - You may also stop by our office during regular business hours and pick up a GoodRx coupon card.  - If you need your prescription sent electronically to a different pharmacy, notify our office through [facility] MyChart or by phone at [phone] option 4.     Si Usted Necesita Algo Despus de Su Visita  Tambin puede enviarnos un mensaje a travs de MyChart. Por lo general respondemos a los mensajes de MyChart en el transcurso de 1 a 2 das hbiles.  Para renovar recetas, por favor pida a su farmacia que se ponga en contacto con nuestra oficina. Nuestro nmero de fax es el [phone].  Si tiene un asunto urgente cuando la clnica est cerrada y que no puede esperar hasta el siguiente da hbil, puede llamar/localizar a su doctor(a) al nmero que aparece a continuacin.   Por favor, tenga en cuenta que aunque hacemos todo lo posible para estar disponibles para asuntos urgentes fuera del horario de oficina, no estamos disponibles las 24 horas del da, los 7 das de la semana.   Si tiene un problema urgente y no puede comunicarse con nosotros, puede optar por buscar atencin mdica  en el consultorio de su doctor(a), en una clnica privada, en un centro de atencin urgente o en una sala de emergencias.  Si tiene una emergencia mdica, por favor llame inmediatamente al 911 o vaya a la sala de emergencias.  Nmeros de bper  - Dr. Kowalski: [phone]  - Dra. Moye: [phone]  - Dra. Stewart: [phone]  En caso de inclemencias del tiempo, por favor llame a nuestra lnea principal al [phone] para una actualizacin sobre el estado de cualquier retraso o cierre.  Consejos para la medicacin en dermatologa: Por favor, guarde las cajas en las que vienen los medicamentos de uso tpico para ayudarle a seguir las  instrucciones sobre dnde y cmo usarlos. Las farmacias generalmente imprimen las instrucciones del medicamento slo en las cajas y no directamente en los tubos del medicamento.   Si su medicamento es muy caro, por favor, pngase en contacto con nuestra oficina llamando al [phone] y presione la opcin 4 o envenos un mensaje a travs de MyChart.   No podemos decirle cul ser su copago por los medicamentos por adelantado ya que esto es diferente dependiendo de la cobertura de su seguro. Sin embargo, es posible que podamos encontrar un medicamento sustituto a menor costo o llenar un formulario para que el seguro cubra el medicamento que se considera necesario.   Si se requiere una autorizacin previa para que su compaa de seguros cubra su medicamento, por favor permtanos de 1 a 2 das hbiles para completar este proceso.  Los precios de los medicamentos varan con frecuencia dependiendo del lugar de dnde se surte la receta y alguna farmacias pueden ofrecer precios ms baratos.  El sitio web www.goodrx.com tiene cupones   para medicamentos de Airline pilot. Los precios aqu no tienen en cuenta lo que podra costar con la ayuda del seguro (puede ser ms barato con su seguro), pero el sitio web puede darle el precio si no utiliz Research scientist (physical sciences).  - Puede imprimir el cupn correspondiente y llevarlo con su receta a la farmacia.  - Tambin puede pasar por nuestra oficina durante el horario de atencin regular y Charity fundraiser una tarjeta de cupones de GoodRx.  - Si necesita que su receta se enve electrnicamente a una farmacia diferente, informe a nuestra oficina a travs de MyChart de [place] o por telfono llamando al [phone] y presione la opcin 4.

## 2020-12-25 NOTE — Telephone Encounter (Signed)
Patient cannot get 90 day supply until we confirm BP and potassium fine on combination of spironolactone and amlodipine-benazepril. So 30 day supply only, and yes I know she is on the other meds. Thank you!

## 2020-12-31 ENCOUNTER — Encounter: Payer: Self-pay | Admitting: Family Medicine

## 2021-01-01 ENCOUNTER — Encounter: Payer: Self-pay | Admitting: Dermatology

## 2021-01-08 ENCOUNTER — Other Ambulatory Visit: Payer: Self-pay

## 2021-01-08 ENCOUNTER — Ambulatory Visit (INDEPENDENT_AMBULATORY_CARE_PROVIDER_SITE_OTHER): Payer: Managed Care, Other (non HMO)

## 2021-01-08 ENCOUNTER — Encounter: Payer: Self-pay | Admitting: Dermatology

## 2021-01-08 ENCOUNTER — Encounter: Payer: Self-pay | Admitting: Nurse Practitioner

## 2021-01-08 DIAGNOSIS — L650 Telogen effluvium: Secondary | ICD-10-CM

## 2021-01-08 NOTE — Patient Instructions (Signed)
Spironolactone can cause increased urination and cause blood pressure to decrease. Please watch for signs of lightheadedness and be cautious when changing position. It can sometimes cause breast tenderness or an irregular period in premenopausal women. It can also increase potassium. The increase in potassium usually is not a concern unless you are taking other medicines that also increase potassium, so please be sure your doctor knows all of the other medications you are taking. This medication should not be taken by pregnant women.  This medicine should also not be taken together with sulfa drugs like Bactrim (trimethoprim/sulfamethexazole).   If You Need Anything After Your Visit  If you have any questions or concerns for your doctor, please call our main line at [phone] and press option 4 to reach your doctor's medical assistant. If no one answers, please leave a voicemail as directed and we will return your call as soon as possible. Messages left after 4 pm will be answered the following business day.   You may also send [place] a message via [place]. We typically respond to MyChart messages within 1-2 business days.  For prescription refills, please ask your pharmacy to contact our office. Our fax number is [phone].  If you have an urgent issue when the clinic is closed that cannot wait until the next business day, you can page your doctor at the number below.    Please note that while we do our best to be available for urgent issues outside of office hours, we are not available 24/7.   If you have an urgent issue and are unable to reach [place], you may choose to seek medical care at your doctor's office, retail clinic, urgent care center, or emergency room.  If you have a medical emergency, please immediately call 911 or go to the emergency department.  Pager Numbers  - Dr. Nehemiah Massed: [phone]  - Dr. Laurence Ferrari: [phone]  - Dr. Nicole Kindred: [phone]  In the event of inclement  weather, please call our main line at [phone] for an update on the status of any delays or closures.  Dermatology Medication Tips: Please keep the boxes that topical medications come in in order to help keep track of the instructions about where and how to use these. Pharmacies typically print the medication instructions only on the boxes and not directly on the medication tubes.   If your medication is too expensive, please contact our office at [phone] option 4 or send [place] a message through [place].   We are unable to tell what your co-pay for medications will be in advance as this is different depending on your insurance coverage. However, we may be able to find a substitute medication at lower cost or fill out paperwork to get insurance to cover a needed medication.   If a prior authorization is required to get your medication covered by your insurance company, please allow [place] 1-2 business days to complete this process.  Drug prices often vary depending on where the prescription is filled and some pharmacies may offer cheaper prices.  The website www.goodrx.com contains coupons for medications through different pharmacies. The prices here do not account for what the cost may be with help from insurance (it may be cheaper with your insurance), but the website can give you the price if you did not use any insurance.  - You can print the associated coupon and take it with your prescription to the pharmacy.  - You may also stop by our office during regular business hours and  pick up a GoodRx coupon card.  - If you need your prescription sent electronically to a different pharmacy, notify our office through [facility] or by phone at [phone] option 4.     Si Usted Necesita Algo Despus de Su Visita  Tambin puede enviarnos un mensaje a travs de Pharmacist, community. Por lo general respondemos a los mensajes de MyChart en el transcurso de 1 a 2 das hbiles.  Para renovar recetas,  por favor pida a su farmacia que se ponga en contacto con nuestra oficina. Harland Dingwall de fax es [place] [phone].  Si tiene un asunto urgente cuando la clnica est cerrada y que no puede esperar hasta el siguiente da hbil, puede llamar/localizar a su doctor(a) al nmero que aparece a continuacin.   Por favor, tenga en cuenta que aunque hacemos todo lo posible para estar disponibles para asuntos urgentes fuera del horario de [place], no estamos disponibles las 24 horas del da, los 7 das de la [place].   Si tiene un problema urgente y no puede comunicarse con nosotros, puede optar por buscar atencin mdica  en el consultorio de su doctor(a), en una clnica privada, en un centro de atencin urgente o en una sala de emergencias.  Si tiene Engineering geologist, por favor llame inmediatamente al 911 o vaya a la sala de emergencias.  Nmeros de bper  - Dr. Nehemiah Massed: [phone]  - Dra. Moye: [phone]  - Dra. Nicole Kindred: [phone]  En caso de inclemencias del [place], por favor llame a [place] Kindred principal al [phone] para una actualizacin sobre el [place] de cualquier retraso o cierre.  Consejos para la medicacin en dermatologa: Por favor, guarde las cajas en las que vienen los medicamentos de uso tpico para ayudarle a seguir las instrucciones sobre dnde y cmo usarlos. Las farmacias generalmente imprimen las instrucciones del medicamento slo en las cajas y no directamente en los tubos del [place].   Si su medicamento es muy caro, por favor, pngase en contacto con Zigmund Daniel llamando al [phone] y presione la opcin 4 o envenos un mensaje a travs de Pharmacist, community.   No podemos decirle cul ser su copago por los medicamentos por adelantado ya que esto es diferente dependiendo de la cobertura de su seguro. Sin embargo, es posible que podamos encontrar un medicamento sustituto a Electrical engineer un formulario para que el seguro cubra el medicamento que se  considera necesario.   Si se requiere una autorizacin previa para que su compaa de seguros [place] su medicamento, por favor permtanos de 1 a 2 das hbiles para completar este proceso.  Los precios de los medicamentos varan con frecuencia dependiendo del Environmental consultant de dnde se surte la receta y alguna farmacias pueden ofrecer precios ms baratos.  El sitio web www.goodrx.com tiene cupones para medicamentos de Airline pilot. Los precios aqu no tienen en cuenta lo que podra costar con la ayuda del seguro (puede ser ms barato con su seguro), pero el sitio web puede darle el precio si no utiliz Research scientist (physical sciences).  - Puede imprimir el cupn correspondiente y llevarlo con su receta a la farmacia.  - Tambin puede pasar por nuestra oficina durante el horario de atencin regular y Charity fundraiser una tarjeta de cupones de GoodRx.  - Si necesita que su receta se enve electrnicamente a una farmacia diferente, informe a nuestra oficina a travs de MyChart de [place] o por telfono llamando al [phone] y presione la opcin 4.

## 2021-01-08 NOTE — Progress Notes (Signed)
° °  Follow-Up Visit   Subjective  Tabitha Shaw is a 60 y.o. female who presents for the following: Follow-up (Patient here today for 2 week blood pressure check. ).   The following portions of the chart were reviewed this encounter and updated as appropriate:       Review of Systems:  No other skin or systemic complaints except as noted in HPI or Assessment and Plan.  Objective  Well appearing patient in no apparent distress; mood and affect are within normal limits.  A focused examination was performed including left arm. Relevant physical exam findings are noted in the Assessment and Plan.    Assessment & Plan  Telogen effluvium   Patient's blood pressure today 106/73. Patient advises she is feeling fine on spironolactone but patient is only taking 1/2 of 100 mg pill. Patient's PCP, Dr. Ancil Boozer advised patient to only take 50 mg daily.   Return for as scheduled.

## 2021-01-13 NOTE — Progress Notes (Signed)
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  INTERVAL HISTORY: Patient returns to clinic today for routine 81-monthevaluation and continuation of phlebotomy.  She recently was evaluated for "memory issues", but MRI of the brain did not reveal any significant pathology other than a 6 mm benign lesion.  She currently feels well and is asymptomatic.  She has no neurologic complaints.  She denies any recent fevers or illnesses.  She denies any chest pain, shortness of breath, cough, or hemoptysis.  She denies any weakness or fatigue.  She has a good appetite and denies weight loss. She denies any nausea, vomiting, constipation, or diarrhea. She has no urinary complaints.  Patient offers no further specific complaints today.  REVIEW OF SYSTEMS:   Review of Systems  Constitutional: Negative.  Negative for fever, malaise/fatigue and weight loss.  Respiratory: Negative.  Negative for cough and shortness of breath.   Cardiovascular: Negative.  Negative for chest pain and leg swelling.  Gastrointestinal: Negative.  Negative for abdominal pain, blood in stool and melena.  Genitourinary: Negative.  Negative for dysuria.  Musculoskeletal: Negative.  Negative for back pain.  Skin: Negative.  Negative for rash.  Neurological: Negative.  Negative for sensory change, focal weakness and weakness.  Psychiatric/Behavioral: Negative.  The patient is not nervous/anxious.    As per HPI. Otherwise, a complete review of systems is negative.  PAST MEDICAL HISTORY: Past Medical History:   Diagnosis Date   Acquired acanthosis nigricans    Di[place] 2[place]    if controlled was not specified   Hemochromatosis    Hepatomegaly    HLD (hyperlipidemia)    HTN (hypertension)    Hyperlipidemia    Hypertension    Hypertrophy of kidney    Hypothyroidism    Intertrigo    Obesity    Osteopenia    Postinflammatory hyperpigmentation    Snoring    Thyroid disease    Vitamin D deficiency     PAST SURGICAL HISTORY: Past Surgical History:  Procedure Laterality Date   ABDOMINAL HYSTERECTOMY  2005   blood transfusion yearly     BREAST REDUCTION SURGERY     [place]  COLONOSCOPY WITH PROPOFOL N/A 11/11/2018   Procedure: COLONOSCOPY WITH PROPOFOL;  Surgeon: TVirgel Manifold MD;  Location: [facility] ENDOSCOPY;  Service: Endoscopy;  Laterality: N/A;   COLONOSCOPY WITH PROPOFOL N/A 02/16/2020   Procedure: COLONOSCOPY WITH PROPOFOL;  Surgeon: TVirgel Manifold MD;  Location: [facility] ENDOSCOPY;  Service: Endoscopy;  Laterality: N/A;   lapcholecystectomy     PARATHYROIDECTOMY Right 11/26/2017   [facility]      FAMILY HISTORY Family History  Problem Relation Age of Onset   Colon polyps Mother    Hemachromatosis Mother    Hypertension Mother    Depression Sister    Hypertension Brother    Colon polyps Brother    Cancer Father    Breast cancer Cousin    Breast cancer Cousin  ADVANCED DIRECTIVES:    HEALTH MAINTENANCE: Social History   Tobacco Use   Smoking status: Former    Packs/day: 0.25    Years: 8.00    Pack years: 2.00    Types: Cigarettes    Start date: 01/19/1982    Quit date: 01/19/1990    Years since quitting: 31.0   Smokeless tobacco: Never  Vaping Use   Vaping Use: Never used  Substance Use Topics   Alcohol use: Yes    Alcohol/week: 0.0 standard drinks    Comment:  occasionally   Drug use: No     Colonoscopy:  PAP:  Bone density:  Lipid panel:  No Known Allergies  Current Outpatient Medications  Medication Sig Dispense Refill   amLODipine-benazepril (LOTREL) 5-20 MG capsule Take 1 capsule by mouth daily. 90 capsule 1   blood glucose meter kit and supplies Dispense based on patient and insurance preference. Use up to four times daily as directed. (FOR ICD-10 E10.9, E11.9). 1 each 0   dapagliflozin propanediol (FARXIGA) 10 MG TABS tablet Take 1 tablet (10 mg total) by mouth daily before breakfast. 90 tablet 1   Eflornithine HCl (VANIQA) 13.9 % cream Apply to affected areas of face BID. 45 g 3   finasteride (PROSCAR) 5 MG tablet Take 1/2 tablet by mouth daily 15 tablet 3   ketoconazole (NIZORAL) 2 % cream Apply 1 application topically 2 (two) times daily. Until clear. Then use 1 additional week after clear. Apply under left breast 30 g 4   ketoconazole (NIZORAL) 2 % shampoo Apply topically 2 (two) times a week. 120 mL 2   levothyroxine (SYNTHROID) 75 MCG tablet Take 1 tablet (75 mcg total) by mouth daily. 90 tablet 1   ONE TOUCH ULTRA TEST test strip CHECK FASTING BLOOD SUGAR  TWICE WEEKLY 100 each 1   rosuvastatin (CRESTOR) 40 MG tablet Take 1 tablet (40 mg total) by mouth daily. 90 tablet 1   Semaglutide, 1 MG/DOSE, (OZEMPIC, 1 MG/DOSE,) 4 MG/3ML SOPN Inject 1 mg into the skin once a week. 9 mL 1   spironolactone (ALDACTONE) 100 MG tablet Take 1 tablet (100 mg total) by mouth daily. 30 tablet 1   Vitamin D, Ergocalciferol, (DRISDOL) 1.25 MG (50000 UNIT) CAPS capsule Take 1 capsule (50,000 Units total) by mouth every 7 (seven) days. 12 capsule 1   No current facility-administered medications for this visit.    OBJECTIVE: Vitals:   01/14/21 1425  BP: 104/72  Pulse: 88  Resp: 16  Temp: (!) 96.8 F (36 C)  SpO2: 98%     Body mass index is 37.59 kg/m.    ECOG FS:0 - Asymptomatic  General: Well-developed, well-nourished, no acute  distress. Eyes: Pink conjunctiva, anicteric sclera. HEENT: Normocephalic, moist mucous membranes. Lungs: No audible wheezing or coughing. Heart: Regular rate and rhythm. Abdomen: Soft, nontender, no obvious distention. Musculoskeletal: No edema, cyanosis, or clubbing. Neuro: Alert, answering all questions appropriately. Cranial nerves grossly intact. Skin: No rashes or petechiae noted. Psych: Normal affect.   LAB RESULTS:  Lab Results  Component Value Date   NA 142 10/21/2020   K 4.0 10/21/2020   CL 102 10/21/2020   CO2 21 10/21/2020   GLUCOSE 142 (H) 10/21/2020   BUN 11 10/21/2020   CREATININE 0.95 10/21/2020   CALCIUM 9.7 10/21/2020   PROT 7.2 10/21/2020   ALBUMIN 4.5 10/21/2020   AST 27 10/21/2020   ALT 25 10/21/2020   ALKPHOS 76 10/21/2020   BILITOT 0.7 10/21/2020   GFRNONAA  81 05/12/2019   GFRAA 94 05/12/2019    Lab Results  Component Value Date   WBC 7.5 08/26/2017   NEUTROABS 3.7 08/26/2017   HGB 14.9 08/26/2017   HCT 42.4 08/26/2017   MCV 102 (H) 08/26/2017   PLT 253 08/26/2017     STUDIES: No results found.  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  PLAN:    1.  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  All of her other laboratory work continues to be within normal limits.  Goal ferritin is between 50 and 100.  Proceed with 500 mL phlebotomy today.  Return to clinic in 6 months with repeat laboratory work, further evaluation, and continuation of treatment if needed. Patient continues to get all of her laboratory work at [facility].   2.  Parathyroid surgery: Continue treatment and follow-up at [facility]. 3.  Brain MRI: 6 mm likely benign lesion noted.  Patient reports neurology has ordered repeat MRI in February to assess for interval change.  I spent a total of 20 minutes reviewing chart data, face-to-face evaluation with the patient, counseling and  coordination of care as detailed above.  Patient expressed understanding and was in agreement with this plan. She also understands that She can call clinic at any time with any questions, concerns, or complaints.    Lloyd Huger, MD   01/14/2021 4:02 PM

## 2021-01-14 ENCOUNTER — Inpatient Hospital Stay: Payer: Managed Care, Other (non HMO) | Attending: Oncology | Admitting: Oncology

## 2021-01-14 ENCOUNTER — Other Ambulatory Visit: Payer: Self-pay

## 2021-01-14 ENCOUNTER — Inpatient Hospital Stay: Payer: Managed Care, Other (non HMO)

## 2021-01-14 ENCOUNTER — Encounter: Payer: Self-pay | Admitting: Oncology

## 2021-01-14 NOTE — Progress Notes (Signed)
Performed 500 ml phlebotomy via R AC  # 20 angio cath. Patient tolerated procedure well. Declined a beverage or snack. VSS. Reports feeling well. Discharged to home.

## 2021-01-14 NOTE — Patient Instructions (Signed)

## 2021-01-16 ENCOUNTER — Telehealth: Payer: Self-pay | Admitting: Dermatology

## 2021-01-16 NOTE — Telephone Encounter (Signed)
Patient's BP at her BP check last week was 106/73 (on 50 mg daily of spironolactone + her benazepril-amlodipine) and it was 104/72 at beginning of her phlebotomy session 2 days ago.   However, it dropped to 96/72 after phlebotomy. Patient denies any lightheadedness or symptoms. However, I am concerned about the possibility of her BP dropping too low after phlebotomy sessions every 6 months while on her amlodipine-benazepril and also the spironolactone.   She checked her BP at home today and it was 111/86. Advised patient ok to continue spironolactone 50 mg daily at this time. We will confirm her potassium level looks good next week. Pending normal potassium, may discuss with Dr. Ancil Boozer if it would be possible to decrease her amlodipine and increase her spironolactone to get better improvement in hair loss.  Advised patient that for her next phlebotomy session (planned in June), she should stop the spironolactone 3 days before her phlebotomy and wait 3 days after before restarting it to help be sure her blood pressure does not drop too much.

## 2021-01-20 ENCOUNTER — Other Ambulatory Visit: Payer: Self-pay | Admitting: Family Medicine

## 2021-01-20 DIAGNOSIS — E1129 Type 2 diabetes mellitus with other diabetic kidney complication: Secondary | ICD-10-CM

## 2021-01-24 ENCOUNTER — Ambulatory Visit (INDEPENDENT_AMBULATORY_CARE_PROVIDER_SITE_OTHER): Payer: Managed Care, Other (non HMO)

## 2021-01-24 DIAGNOSIS — E538 Deficiency of other specified B group vitamins: Secondary | ICD-10-CM | POA: Diagnosis not present

## 2021-01-24 MED ORDER — CYANOCOBALAMIN 1000 MCG/ML IJ SOLN
1000.0000 ug | Freq: Once | INTRAMUSCULAR | Status: AC
  Administered 2021-01-24: 1000 ug via INTRAMUSCULAR

## 2021-01-30 NOTE — Telephone Encounter (Signed)
Thank you! Please call patient and ask her to go by for her potassium blood work at her earliest convenience, ideally by early next week. Thank you!

## 2021-01-30 NOTE — Telephone Encounter (Signed)
I checked and I do not see that patient has had her potassium levels checked yet.Cherly Hensen

## 2021-01-31 ENCOUNTER — Other Ambulatory Visit: Payer: Self-pay | Admitting: Dermatology

## 2021-02-01 LAB — POTASSIUM: Potassium: 4.3 mmol/L (ref 3.5–5.2)

## 2021-02-06 NOTE — Telephone Encounter (Addendum)
Called patient advised of lab results. Continue Spironolactone.      ----- Message from Alfonso Patten, MD sent at 02/06/2021  3:40 PM EST ----- Potassium fine, continue spironolactone.   MAs please call. Thank you!

## 2021-02-19 ENCOUNTER — Ambulatory Visit: Payer: Managed Care, Other (non HMO) | Admitting: Family Medicine

## 2021-02-28 ENCOUNTER — Other Ambulatory Visit (HOSPITAL_COMMUNITY): Payer: Self-pay | Admitting: Neurology

## 2021-02-28 DIAGNOSIS — R9089 Other abnormal findings on diagnostic imaging of central nervous system: Secondary | ICD-10-CM

## 2021-02-28 DIAGNOSIS — R413 Other amnesia: Secondary | ICD-10-CM

## 2021-03-12 ENCOUNTER — Ambulatory Visit (HOSPITAL_COMMUNITY)
Admission: RE | Admit: 2021-03-12 | Discharge: 2021-03-12 | Disposition: A | Discharge status: Home / Self Care | Payer: Managed Care, Other (non HMO) | Source: Ambulatory Visit | Attending: Neurology | Admitting: Neurology

## 2021-03-12 ENCOUNTER — Other Ambulatory Visit: Payer: Self-pay

## 2021-03-12 DIAGNOSIS — R413 Other amnesia: Secondary | ICD-10-CM | POA: Insufficient documentation

## 2021-03-12 DIAGNOSIS — R9089 Other abnormal findings on diagnostic imaging of central nervous system: Secondary | ICD-10-CM | POA: Diagnosis present

## 2021-03-12 IMAGING — MR MR HEAD W/O CM
18 series · 44 of 48 positions shown · non-contrast
Comparison: [DATE]

CLINICAL DATA: Memory loss with abnormal brain MRI.

EXAM:
MRI HEAD WITHOUT CONTRAST
TECHNIQUE: Multiplanar, multiecho pulse sequences of the brain and surrounding
structures were obtained without intravenous contrast.
Additionally, using NeuroQuant software a 3D volumetric analysis of
the brain was performed and is compared to a normative database
adjusted for age, gender and intracranial volume.

[Series 2: FLAIR · sagittal · 5.0mm · 0.47mm/px · 1 of 31 slices shown (1 of 2)]
[im 1/31]
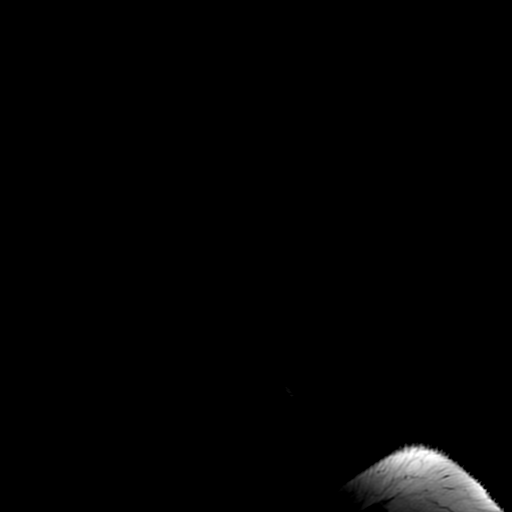

[Series 3: DWI · axial · 3.0mm · 0.94mm/px · 1 of 110 slices shown (1 of 2)]
[im 1/110]
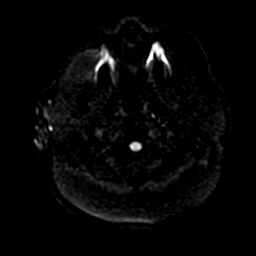

[Series 4: T1 · sagittal · 1.2mm · 0.94mm/px · 4 of 160 slices shown (1 of 2)]
[im 1/160]
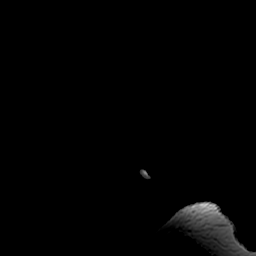
[im 54/160]
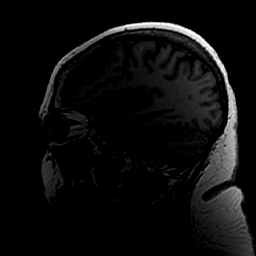
[im 107/160]
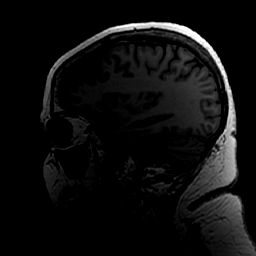
[im 160/160]
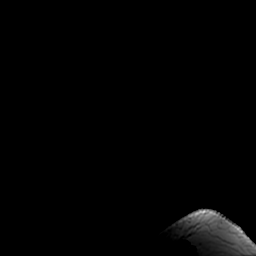

[Series 5: T2 · axial · 5.0mm · 0.47mm/px · 1 of 29 slices shown (1 of 2)]
[im 1/29]
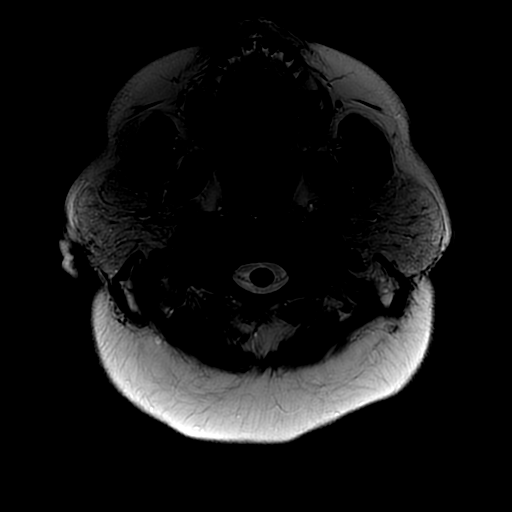

[Series 6: FLAIR · axial · 4.0mm · 0.45mm/px · 1 of 30 slices shown (2 of 2)]
[im 1/30]
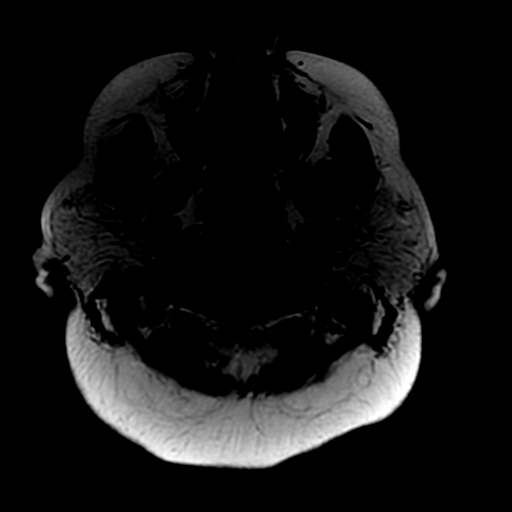

[Series 7: DWI · coronal · 4.0mm · 0.94mm/px · 2 of 88 slices shown (2 of 2)]
[im 1/88]
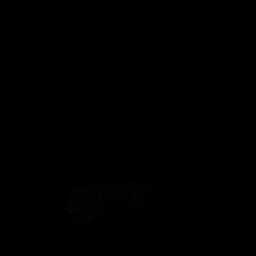
[im 88/88]
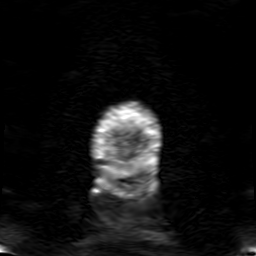

[Series 8: SWI · axial · 2.9mm · 0.47mm/px · z∈[-56,+108]mm · 2 of 110 slices shown (1 of 2)]
[im 1/110]
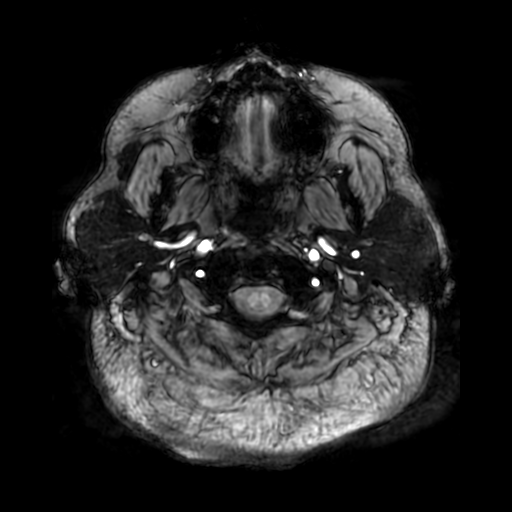
[im 110/110]
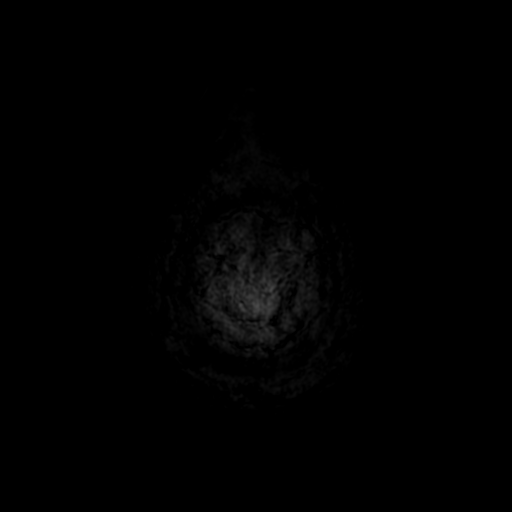

[Series 9: T1 · axial · non-contrast · 3.0mm · 0.94mm/px · 1 of 56 slices shown (2 of 2)]
[im 1/56]
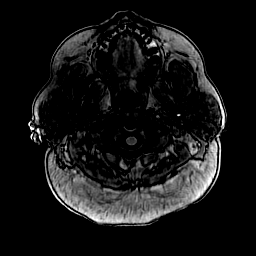

[Series 10: T2 · coronal · 5.0mm · 0.39mm/px · 1 of 36 slices shown (2 of 2)]
[im 1/36]
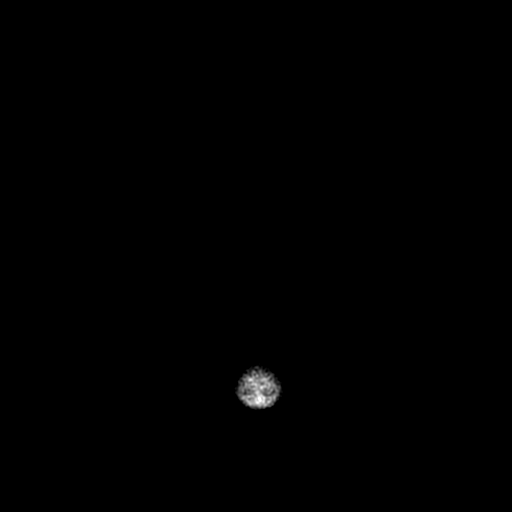

[Series 52: nqsegcorsc · 1.00mm/px · 5 of 225 slices shown (1 of 2)]
[im 1/225]
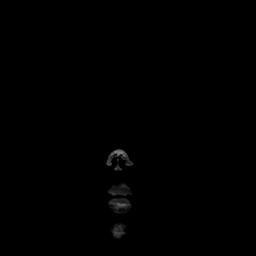
[im 57/225]
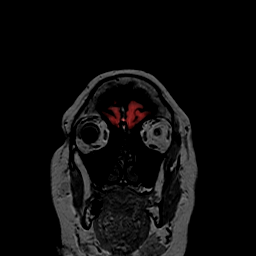
[im 113/225]
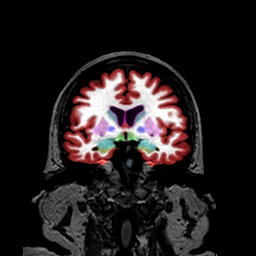
[im 169/225]
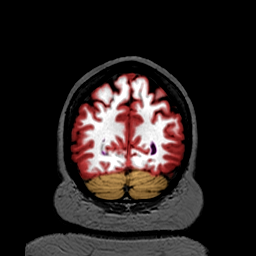
[im 225/225]
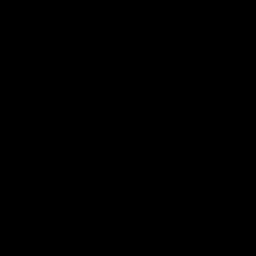

[Series 52: nqsegcorsc · 1.00mm/px · 5 of 225 slices shown (2 of 2)]
[im 1/225]
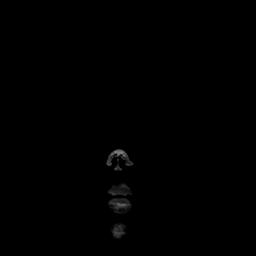
[im 57/225]
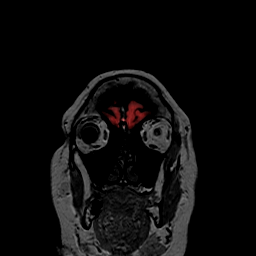
[im 113/225]
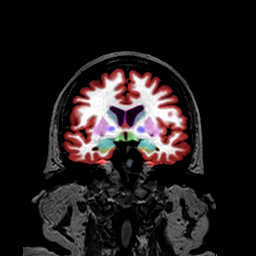
[im 169/225]
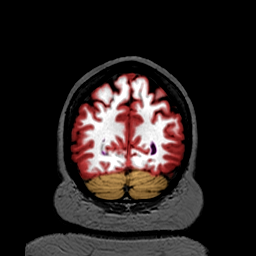
[im 225/225]
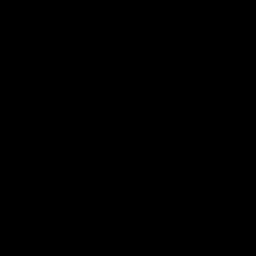

[Series 53: nqsegaxlsc · 1.00mm/px · 5 of 218 slices shown (1 of 2)]
[im 1/218]
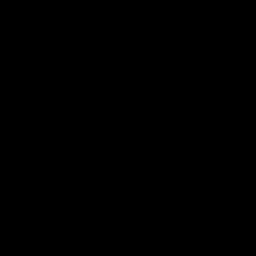
[im 55/218]
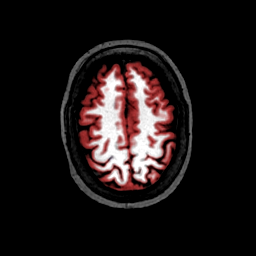
[im 109/218]
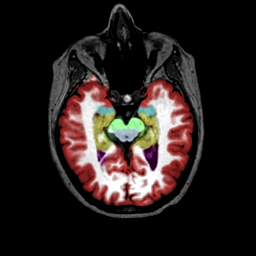
[im 163/218]
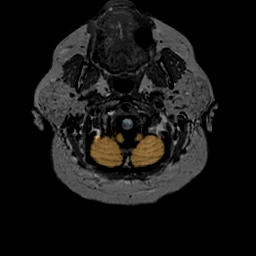
[im 218/218]
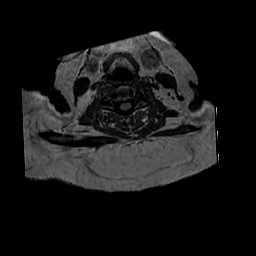

[Series 53: nqsegaxlsc · 1.00mm/px · 5 of 218 slices shown (2 of 2)]
[im 1/218]
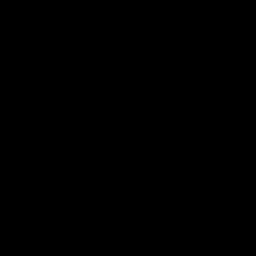
[im 55/218]
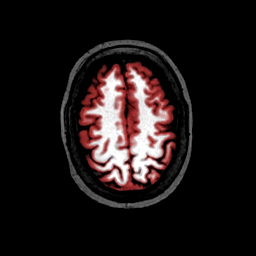
[im 109/218]
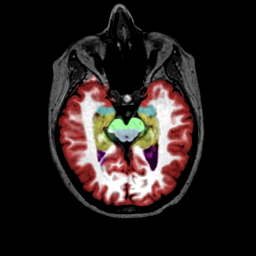
[im 163/218]
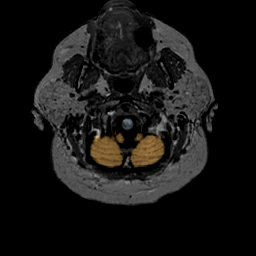
[im 218/218]
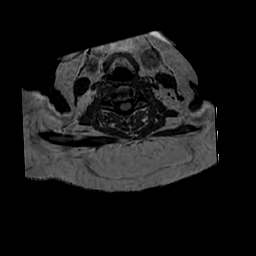

[Series 54: nqsegsagsc · 1.00mm/px · 5 of 206 slices shown (1 of 2)]
[im 1/206]
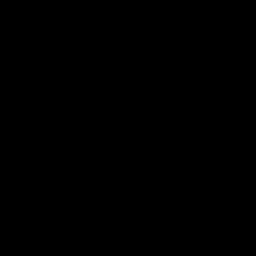
[im 52/206]
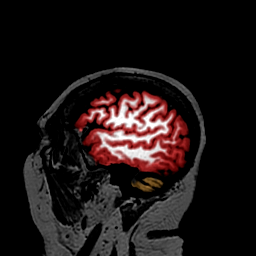
[im 103/206]
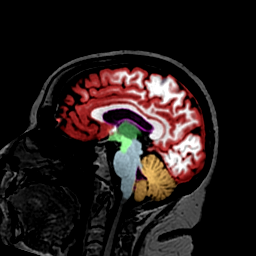
[im 154/206]
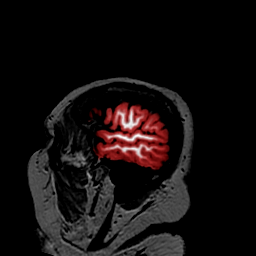
[im 206/206]
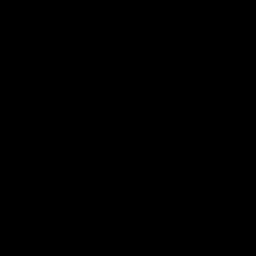

[Series 54: nqsegsagsc · 1.00mm/px · 1 of 206 slices shown (2 of 2)]
[im 1/206]
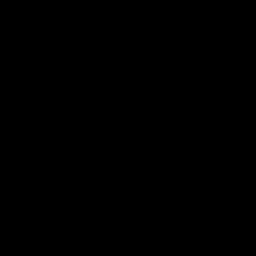

[Series 350: ADC · axial · 3.0mm · 0.94mm/px · 1 of 55 slices shown (1 of 2)]
[im 1/55]
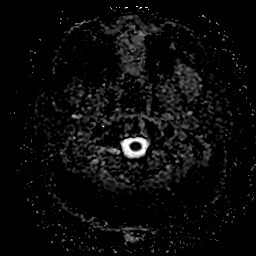

[Series 750: ADC · coronal · 4.0mm · 0.94mm/px · 1 of 42 slices shown (2 of 2)]
[im 1/42]
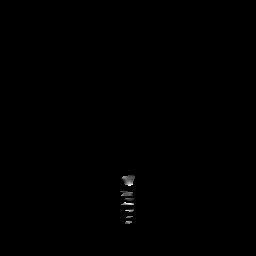

[Series 800: SWI · axial · 2.9mm · 0.47mm/px · z∈[-56,+108]mm · 2 of 110 slices shown (2 of 2)]
[im 1/110]
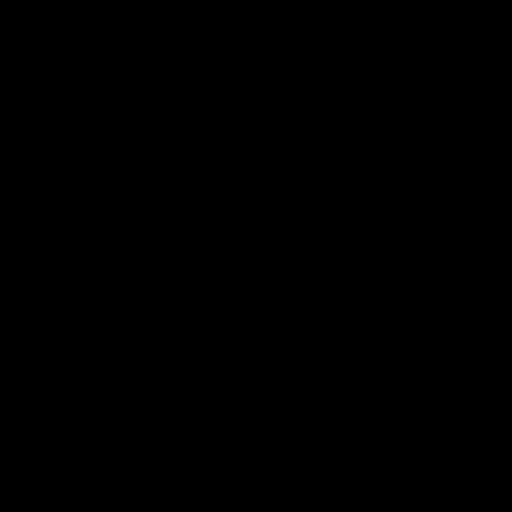
[im 110/110]
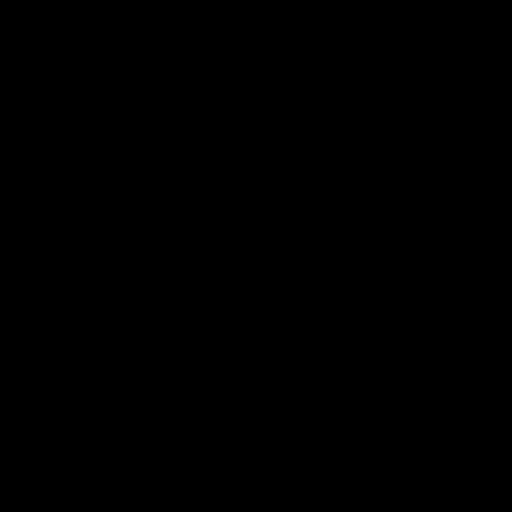

[44 of 48 positions shown; findings below may reference images not displayed]

FINDINGS: Brain: Sellar and suprasellar nodule which is T1 hyperintense with a
hypointense nodular appearance outlined by a cleft like fluid signal
inferiorly on coronal T2 weighted imaging, most consistent with
Rathke's cleft cyst. Dimensions are still 6-7 mm. No mass effect on
adjacent structures by thin-section T1 weighted imaging.

Subjectively normal brain volume. No infarct, hemorrhage,
hydrocephalus, or collection.

Vascular: Normal flow voids

Skull and upper cervical spine: Normal marrow signal

Sinuses/Orbits: Unremarkable

Other: Distended left TMJ capsule.

NeuroQuant Findings:

Volumetric analysis of the brain was performed, with a fully
detailed report in [HOSPITAL] PACS. Briefly, the comparison with age and
gender matched reference reveals normal and preserved brain volume.
An apparent change in thalamic volume could easily be measurement
error. Additionally, white matter signal abnormalities appear
overestimated compared to subjective assessment.
IMPRESSION: 1. Unchanged Rathke's cleft cyst in the sella and suprasellar
cistern measuring 6-7 mm.
Otherwise unremarkable brain MRI.
2. Essentially stable NeuroQuant volumetric analysis of the brain,
see details on [HOSPITAL] PACS.

## 2021-03-13 ENCOUNTER — Ambulatory Visit: Payer: Managed Care, Other (non HMO) | Admitting: Dermatology

## 2021-03-13 DIAGNOSIS — L649 Androgenic alopecia, unspecified: Secondary | ICD-10-CM | POA: Diagnosis not present

## 2021-03-13 DIAGNOSIS — L811 Chloasma: Secondary | ICD-10-CM | POA: Diagnosis not present

## 2021-03-13 DIAGNOSIS — L659 Nonscarring hair loss, unspecified: Secondary | ICD-10-CM

## 2021-03-13 NOTE — Progress Notes (Signed)
Follow-Up Visit   Subjective  Tabitha Shaw is a 61 y.o. female who presents for the following: Follow-up (Patient is here today for melasma follow up. Patient restarted [facility] hydroquinone 12% and 8% qbout 3 weeks ago alternating one in the morning and one at night. Patient also using The Perfect A nightly. ).   The following portions of the chart were reviewed this encounter and updated as appropriate:   Tobacco   Allergies   Meds   Problems   Med Hx   Surg Hx   Fam Hx       Review of Systems:  No other skin or systemic complaints except as noted in HPI or Assessment and Plan.  Objective  Well appearing patient in no apparent distress; mood and affect are within normal limits.  A focused examination was performed including face. Relevant physical exam findings are noted in the Assessment and Plan.  face Reticulated hyperpigmented patches. See photos        Scalp Diffuse thinning of hair    Assessment & Plan  Melasma face  Chronic and persistent condition with duration or expected duration over one year. Condition is symptomatic/ bothersome to patient. Not currently at goal.  Continue The Perfect A nightly.  Continue Skin medicinals prescription hydroquinone 12% once daily until end of March then twice daily until end of April. Continue skin medicinals prescription hydroquinone 8%/tretinoin once daily until the end of March. Continue daily tinted sunscreen Recommend The Inkey Tranexamic Acid3  Discussed treating with chemical peel.  Recommend taking Heliocare sun protection supplement daily in sunny weather for additional sun protection. For maximum protection on the sunniest days, you can take up to 2 capsules of regular Heliocare OR take 1 capsule of Heliocare Ultra. For prolonged exposure (such as a full day in the sun), you can repeat your dose of the supplement 4 hours after your first dose.   Melasma is a chronic condition of persistent  pigmented patches generally on the face, worse in summer due to higher UV exposure.  Oral estrogen containing BCPs or supplements can exacerbate condition.  Recommend daily broad spectrum tinted sunscreen SPF 30+ to face, preferably with Zinc or Titanium Dioxide. Discussed Rx topical bleaching creams (i.e. hydroquinone), OTC HelioCare supplement, chemical peels (would need multiple for best result).     Alopecia Scalp  Androgenetic  Continue spironolactone 50 mg once daily. Patient sees PCP next month and will see if she is able to a blood pressure medications so she can potentially increase spironolactone dose.  Continue finasteride 5 mg 1/2 tablet daily  Spironolactone can cause increased urination and cause blood pressure to decrease. Please watch for signs of lightheadedness and be cautious when changing position. It can sometimes cause breast tenderness or an irregular period in premenopausal women. It can also increase potassium. The increase in potassium usually is not a concern unless you are taking other medicines that also increase potassium, so please be sure your doctor knows all of the other medications you are taking. This medication should not be taken by pregnant women.  This medicine should also not be taken together with sulfa drugs like Bactrim (trimethoprim/sulfamethexazole).   Patient to call Dr. Laurence Ferrari after appointment with PCP advising if ok to increase spironolactone.   Patient defers starting Rogaine due to possible hair growth at face  Female Androgenic Alopecia is a chronic condition related to genetics and/or hormonal changes.  In women androgenetic alopecia is commonly associated with menopause but may  occur any time after puberty.  It causes hair thinning primarily on the crown with widening of the part and temporal hairline recession.  Oral treatments in female patients who have no contraindication may include : - Low dose oral minoxidil 1.25 - 5mg  daily -  Spironolactone 50 - 100mg  bid - Finasteride 2.5 - 5 mg daily Adjunctive therapies include: - Low Level Laser Light Therapy (LLLT) - Platelet-rich plasma injections (PRP) - Hair Transplants or scalp reduction   Related Medications spironolactone (ALDACTONE) 100 MG tablet Take 1 tablet (100 mg total) by mouth daily.  finasteride (PROSCAR) 5 MG tablet Take 1/2 tablet by mouth daily   Return in about 6 months (around 09/10/2021) for Alopecia, melasma.  Graciella Belton, RMA, am acting as scribe for Forest Gleason, MD .  Documentation: I have reviewed the above documentation for accuracy and completeness, and I agree with the above.  Forest Gleason, MD

## 2021-03-13 NOTE — Patient Instructions (Addendum)
Patient to call Dr. Laurence Ferrari after appointment with PCP advising if ok to increase spironolactone.   Continue spironolactone 50 mg once daily. Patient sees PCP next month and will see if she is able to decrease one of her blood pressure medications so she can increase spironolactone.   Continue finasteride 5 mg 1/2 tablet daily  Spironolactone can cause increased urination and cause blood pressure to decrease. Please watch for signs of lightheadedness and be cautious when changing position. It can sometimes cause breast tenderness or an irregular period in premenopausal women. It can also increase potassium. The increase in potassium usually is not a concern unless you are taking other medicines that also increase potassium, so please be sure your doctor knows all of the other medications you are taking. This medication should not be taken by pregnant women.  This medicine should also not be taken together with sulfa drugs like Bactrim (trimethoprim/sulfamethexazole).   Some Recommended Sunscreens Include:  Tinted Face Sunscreen Alastin Hydratint (good for most skin tones, may be slightly dark if you are very fair) Colorescience Sunforgettable Total Protection Face Shield (good for most skin tones) EltaMD UV Physical La Roche Posay Mineral Tinted Cotz Flawless Complexion   Powder Sunscreen (Nice for reapplying or applying on the go) Colorescience Sunforgettable Total Protection Brush on Shield (available in different tints)  Face Sunscreen Available in Different Tints Colorescience Sunforgettable Total Protection Brush on Shield  bareMinerals Complexion Rescue Tinted Hydrating Gel Cream Broad Spectrum SPF 30 UnSun mineral tinted (comes in medium/dark and light/medium)  Continue The Perfect A nightly.  Continue [facility] hydroquinone 12% once daily until end of March then twice daily until end of April. Continue daily tinted sunscreen Recommend The Inkey Tranexamic Acid  If You Need  Anything After Your Visit  If you have any questions or concerns for your doctor, please call our main line at [phone] and press option 4 to reach your doctor's medical assistant. If no one answers, please leave a voicemail as directed and we will return your call as soon as possible. Messages left after 4 pm will be answered the following business day.   You may also send [place] a message via [place]. We typically respond to MyChart messages within 1-2 business days.  For prescription refills, please ask your pharmacy to contact our office. Our fax number is [phone].  If you have an urgent issue when the clinic is closed that cannot wait until the next business day, you can page your doctor at the number below.    Please note that while we do our best to be available for urgent issues outside of office hours, we are not available 24/7.   If you have an urgent issue and are unable to reach [place], you may choose to seek medical care at your doctor's office, retail clinic, urgent care center, or emergency room.  If you have a medical emergency, please immediately call 911 or go to the emergency department.  Pager Numbers  - Dr. Nehemiah Massed: [phone]  - Dr. Laurence Ferrari: [phone]  - Dr. Nicole Kindred: [phone]  In the event of inclement weather, please call our main line at [phone] for an update on the status of any delays or closures.  Dermatology Medication Tips: Please keep the boxes that topical medications come in in order to help keep track of the instructions about where and how to use these. Pharmacies typically print the medication instructions only on the boxes and not directly on the medication tubes.   If your  medication is too expensive, please contact our office at [phone] option 4 or send [place] a message through [place].   We are unable to tell what your co-pay for medications will be in advance as this is different depending on your insurance coverage. However, we may  be able to find a substitute medication at lower cost or fill out paperwork to get insurance to cover a needed medication.   If a prior authorization is required to get your medication covered by your insurance company, please allow [place] 1-2 business days to complete this process.  Drug prices often vary depending on where the prescription is filled and some pharmacies may offer cheaper prices.  The website www.goodrx.com contains coupons for medications through different pharmacies. The prices here do not account for what the cost may be with help from insurance (it may be cheaper with your insurance), but the website can give you the price if you did not use any insurance.  - You can print the associated coupon and take it with your prescription to the pharmacy.  - You may also stop by our office during regular business hours and pick up a GoodRx coupon card.  - If you need your prescription sent electronically to a different pharmacy, notify our office through [facility] or by phone at [phone] option 4.     Si Usted Necesita Algo Despus de Su Visita  Tambin puede enviarnos un mensaje a travs de Pharmacist, community. Por lo general respondemos a los mensajes de MyChart en el transcurso de 1 a 2 das hbiles.  Para renovar recetas, por favor pida a su farmacia que se ponga en contacto con nuestra oficina. Harland Dingwall de fax es [place] [phone].  Si tiene un asunto urgente cuando la clnica est cerrada y que no puede esperar hasta el siguiente da hbil, puede llamar/localizar a su doctor(a) al nmero que aparece a continuacin.   Por favor, tenga en cuenta que aunque hacemos todo lo posible para estar disponibles para asuntos urgentes fuera del horario de [place], no estamos disponibles las 24 horas del da, los 7 das de la [place].   Si tiene un problema urgente y no puede comunicarse con nosotros, puede optar por buscar atencin mdica  en el consultorio de su doctor(a), en una  clnica privada, en un centro de atencin urgente o en una sala de emergencias.  Si tiene Engineering geologist, por favor llame inmediatamente al 911 o vaya a la sala de emergencias.  Nmeros de bper  - Dr. Nehemiah Massed: [phone]  - Dra. Moye: [phone]  - Dra. Nicole Kindred: [phone]  En caso de inclemencias del [place], por favor llame a Johnsie Kindred principal al [phone] para una actualizacin sobre el [place] de cualquier retraso o cierre.  Consejos para la medicacin en dermatologa: Por favor, guarde las cajas en las que vienen los medicamentos de uso tpico para ayudarle a seguir las instrucciones sobre dnde y cmo usarlos. Las farmacias generalmente imprimen las instrucciones del medicamento slo en las cajas y no directamente en los tubos del [place].   Si su medicamento es muy caro, por favor, pngase en contacto con Zigmund Daniel llamando al [phone] y presione la opcin 4 o envenos un mensaje a travs de Pharmacist, community.   No podemos decirle cul ser su copago por los medicamentos por adelantado ya que esto es diferente dependiendo de la cobertura de su seguro. Sin embargo, es posible que podamos encontrar un medicamento sustituto a Electrical engineer un formulario para  que el seguro cubra el medicamento que se considera necesario.   Si se requiere una autorizacin previa para que su compaa de seguros [place] su medicamento, por favor permtanos de 1 a 2 das hbiles para completar este proceso.  Los precios de los medicamentos varan con frecuencia dependiendo del Environmental consultant de dnde se surte la receta y alguna farmacias pueden ofrecer precios ms baratos.  El sitio web www.goodrx.com tiene cupones para medicamentos de Airline pilot. Los precios aqu no tienen en cuenta lo que podra costar con la ayuda del seguro (puede ser ms barato con su seguro), pero el sitio web puede darle el precio si no utiliz Research scientist (physical sciences).  - Puede imprimir el cupn correspondiente y  llevarlo con su receta a la farmacia.  - Tambin puede pasar por nuestra oficina durante el horario de atencin regular y Charity fundraiser una tarjeta de cupones de GoodRx.  - Si necesita que su receta se enve electrnicamente a una farmacia diferente, informe a nuestra oficina a travs de MyChart de [place] o por telfono llamando al [phone] y presione la opcin 4.

## 2021-03-17 ENCOUNTER — Encounter: Payer: Self-pay | Admitting: Dermatology

## 2021-03-25 ENCOUNTER — Encounter: Payer: Self-pay | Admitting: Family Medicine

## 2021-03-26 ENCOUNTER — Other Ambulatory Visit: Payer: Self-pay | Admitting: Family Medicine

## 2021-03-26 DIAGNOSIS — E559 Vitamin D deficiency, unspecified: Secondary | ICD-10-CM

## 2021-03-26 DIAGNOSIS — E039 Hypothyroidism, unspecified: Secondary | ICD-10-CM

## 2021-03-26 DIAGNOSIS — E1129 Type 2 diabetes mellitus with other diabetic kidney complication: Secondary | ICD-10-CM

## 2021-03-26 DIAGNOSIS — Z79899 Other long term (current) drug therapy: Secondary | ICD-10-CM

## 2021-03-26 DIAGNOSIS — E538 Deficiency of other specified B group vitamins: Secondary | ICD-10-CM

## 2021-03-26 DIAGNOSIS — I10 Essential (primary) hypertension: Secondary | ICD-10-CM

## 2021-03-31 ENCOUNTER — Other Ambulatory Visit: Payer: Self-pay | Admitting: Dermatology

## 2021-03-31 DIAGNOSIS — L659 Nonscarring hair loss, unspecified: Secondary | ICD-10-CM

## 2021-03-31 NOTE — Telephone Encounter (Signed)
Advised patient of the following, "She is taking spironolactone 50 mg PO qd.  Blood pressure and potassium have been recently checked and are normal while taking spironolactone 50 mg daily along with the Lotrel.  I would keep her at the current dose for now and have her recheck her blood pressure at her upcoming appt with PCP.  I can send in 50 mg PO qd, #90, no rfs, but not the 100 mg tab in a 90 day supply." Patient agrees with plan and will speak with her PCP regarding Spironolactone and Lotrel at her next visit on 04/04/21.  ?

## 2021-04-03 LAB — COMPREHENSIVE METABOLIC PANEL
ALT: 24 IU/L (ref 0–32)
AST: 18 IU/L (ref 0–40)
Albumin/Globulin Ratio: 2 (ref 1.2–2.2)
Albumin: 4.7 g/dL (ref 3.8–4.9)
Alkaline Phosphatase: 66 IU/L (ref 44–121)
BUN/Creatinine Ratio: 10 (ref 9–23)
BUN: 9 mg/dL (ref 6–24)
Bilirubin Total: 0.8 mg/dL (ref 0.0–1.2)
CO2: 24 mmol/L (ref 20–29)
Calcium: 8.2 mg/dL — ABNORMAL LOW (ref 8.7–10.2)
Chloride: 104 mmol/L (ref 96–106)
Creatinine, Ser: 0.86 mg/dL (ref 0.57–1.00)
Globulin, Total: 2.3 g/dL (ref 1.5–4.5)
Glucose: 120 mg/dL — ABNORMAL HIGH (ref 70–99)
Potassium: 4.4 mmol/L (ref 3.5–5.2)
Sodium: 145 mmol/L — ABNORMAL HIGH (ref 134–144)
Total Protein: 7 g/dL (ref 6.0–8.5)
eGFR: 78 mL/min/{1.73_m2} (ref >59)

## 2021-04-03 LAB — HEMOGLOBIN A1C
Est. average glucose Bld gHb Est-mCnc: 126 mg/dL
Hgb A1c MFr Bld: 6 % — ABNORMAL HIGH (ref 4.8–5.6)

## 2021-04-03 LAB — TSH: TSH: 0.183 u[IU]/mL — ABNORMAL LOW (ref 0.450–4.500)

## 2021-04-03 LAB — MAGNESIUM: Magnesium: 2.1 mg/dL (ref 1.6–2.3)

## 2021-04-03 LAB — VITAMIN B12: Vitamin B-12: 395 pg/mL (ref 232–1245)

## 2021-04-03 LAB — VITAMIN D 25 HYDROXY (VIT D DEFICIENCY, FRACTURES): Vit D, 25-Hydroxy: 94.7 ng/mL (ref 30.0–100.0)

## 2021-04-03 NOTE — Progress Notes (Signed)
Name: Tabitha Shaw   MRN: [id number]    DOB: Apr 04, 1960   Date:04/04/2021 ? ?     Progress Note ? ?Subjective ? ?Chief Complaint ? ?Follow Up ? ?HPI ? ?DMII : hgbA1C spiked to 8.1% back in August 2020, this week it was down to 6 %  She states glucose at home has been 101-120's.   She is compliant  Ozempic and Farxiga, we stopped Metformin due to diarrhea   Denies polyphagia, polydipsia or polyuria, but she has nocturia once per night - stable. She has been taking benazepril for microalbuminuria but level was high at 259, she is now on [place] and we will recheck urine micro  . Last LDL was at goal at 64 . ?  ?HTN: denies  chest pain, palpitation or dizziness. Continue Lotrel 5/20, dermatologist added Spironolactone 50 mg but potassium is normal. We were going to decrease bp medication but she needs to stay on ACE/ARB and higher doses of spironolactone may cause hyperkalemia  ?  ?Hyperlipidemia: she was taking Atorvastatin but LDL was high at 162 , so we switched to Crestor in 2020 and last LDL was 64  continue medications. No myalgia.  ?  ?Hypothyroidism: she is taking Synthroid 76mg  daily , she has noticed intermittent diarrhea ( last TSH suppressed) denies dysphagia , positive for weight loss . TSH low, discussed adjusting dose now or repeating in 4 weeks and she chose the later. Previously normal levels ?  ?Morbid obesity: BMI over 35 with co-morbidities such as HTN, DM II, she continues to lose weight, she states working from home, but takes breaks every hour to do something around the house, she is taking Ozempic and Farxiga, TSH is suppressed, she lost 12 lbs since Dec .  ?  ?History of Primary  Hyperparathyroidism: seen by Dr. SGabriel Carinaand referred to Dr. KMaudie Mercury found to have a right lower parathyroid enlargement and had surgery on 11/26/2017, and has been released since, last Pth was normal. Denies cramps, GERD symptoms. We will recheck level  ?  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 Last  treatment was 12/2019 ,removal of 500 ml. ? ?Memory difficulties: she is worried because daughter noticed she has been forgetful, there is a family history of Alzheimer's dementia, and her 23&Me test was positive. Discussed MMS, signs and symptoms of Alzheimer's , she has been working on puzzles every night. She saw Dr. SManuella Ghazi she was diagnosed with mild cognitive dysfunction, she has started wearing CPAP since last visit but not sure if it has helped with her memory  ?  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 HKenton Kingfisher she had repeat pap smear April 2022 that was normal ,she is going back to gyn to have repeat pap smear soon ? ?OSA:she has the CPAP machine now , but only able to tolerate a few hours per night, 4 hours is the goal  ? ?Vitamin D: last vitamin D was towards high end normal,  ? ?Vitamin B12 deficiency: she would like to get injections B12, discussed if she does not want to come monthly, she can take SL b12 1000 mcg daily  ? ?Supra Sellar Mass: found on MRI brain, had it repeated 02/23  ? ?1. 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 ?Otherwise unremarkable brain MRI. ?2. Essentially stable NeuroQuant volumetric analysis of the brain, ?see details on CBJ's ?  ? ?Patient Active Problem List  ? Diagnosis Date Noted  ?  S/P laparoscopic supracervical hysterectomy 04/23/2020  ? Dysplasia of cervix, low grade (CIN 1) 11/22/2019  ? ASCUS with positive high risk HPV cervical 10/24/2019  ? Personal history of colonic polyps   ? Polyp of colon   ? Cervical high risk HPV (human papillomavirus) test positive 09/03/2016  ? B12 deficiency 09/04/2015  ? Elevated PTHrP level 09/04/2015  ? Benign hypertension 09/12/2014  ? Acanthosis nigricans 09/09/2014  ? Diabetes mellitus with renal manifestation ([place]) 09/09/2014  ? Dyslipidemia 09/09/2014  ? Abnormal electrocardiogram 09/09/2014  ? Enlarged kidney 09/09/2014  ? 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 Enlarged liver  09/09/2014  ? Calcium blood increased 09/09/2014  ? Adult hypothyroidism 09/09/2014  ? Microalbuminuria 09/09/2014  ? Morbid obesity due to excess calories ([place]) 09/09/2014  ? Spinal stenosis of lumbar region with neurogenic claudication 09/09/2014  ? Osteopenia 09/09/2014  ? Vitamin D deficiency 09/09/2014  ? ? ?Past Surgical History:  ?Procedure Laterality Date  ? ABDOMINAL HYSTERECTOMY  2005  ? blood transfusion yearly    ? BREAST REDUCTION SURGERY    ? CESAREAN SECTION    ? [place]  ? CHOLECYSTECTOMY  2000  ? COLONOSCOPY WITH PROPOFOL N/A 11/11/2018  ? Procedure: COLONOSCOPY WITH PROPOFOL;  Surgeon: Virgel Manifold, MD;  Location: [facility] ENDOSCOPY;  Service: Endoscopy;  Laterality: N/A;  ? COLONOSCOPY WITH PROPOFOL N/A 02/16/2020  ? Procedure: COLONOSCOPY WITH PROPOFOL;  Surgeon: Virgel Manifold, MD;  Location: [facility] ENDOSCOPY;  Service: Endoscopy;  Laterality: N/A;  ? lapcholecystectomy    ? PARATHYROIDECTOMY Right 11/26/2017  ? right lower , Dr. Maudie Mercury at [facility]  ? REDUCTION MAMMAPLASTY Bilateral 1985  ? VESICOVAGINAL FISTULA CLOSURE W/ TAH    ? ? ?Family History  ?Problem Relation Age of Onset  ? Colon polyps Mother   ? Hemachromatosis Mother   ? Hypertension Mother   ? Depression Sister   ? Hypertension Brother   ? Colon polyps Brother   ? Cancer Father   ? Breast cancer Cousin   ? Breast cancer Cousin   ? ? ?Social History  ? ?Tobacco Use  ? Smoking status: Former  ?  Packs/day: 0.25  ?  Years: 8.00  ?  Pack years: 2.00  ?  Types: Cigarettes  ?  Start date: 01/19/1982  ?  Quit date: 01/19/1990  ?  Years since quitting: 31.2  ? Smokeless tobacco: Never  ?Substance Use Topics  ? Alcohol use: Yes  ?  Alcohol/week: 0.0 standard drinks  ?  Comment: occasionally  ? ? ? ?Current Outpatient Medications:  ?  blood glucose meter kit and supplies, Dispense based on patient and insurance preference. Use up to four times daily as directed. (FOR ICD-10 E10.9, E11.9)., Disp: 1 each, Rfl: 0 ?  Cyanocobalamin  (B-12) 1000 MCG SUBL, Place 1 tablet under the tongue 3 (three) times a week., Disp: 30 tablet, Rfl: 0 ?  Eflornithine HCl (VANIQA) 13.9 % cream, Apply to affected areas of face BID., Disp: 45 g, Rfl: 3 ?  FARXIGA 10 MG TABS tablet, TAKE 1 TABLET(10 MG) BY MOUTH DAILY BEFORE BREAKFAST, Disp: 90 tablet, Rfl: 1 ?  finasteride (PROSCAR) 5 MG tablet, Take 1/2 tablet by mouth daily, Disp: 15 tablet, Rfl: 3 ?  levothyroxine (SYNTHROID) 75 MCG tablet, Take 1 tablet (75 mcg total) by mouth daily., Disp: 90 tablet, Rfl: 1 ?  ONE TOUCH ULTRA TEST test strip, CHECK FASTING BLOOD SUGAR  TWICE WEEKLY, Disp: 100 each, Rfl: 1 ?  spironolactone (ALDACTONE) 50  MG tablet, Take 1 tablet (50 mg total) by mouth daily., Disp: 90 tablet, Rfl: 0 ?  amLODipine-benazepril (LOTREL) 5-20 MG capsule, Take 1 capsule by mouth daily., Disp: 90 capsule, Rfl: 1 ?  rosuvastatin (CRESTOR) 40 MG tablet, Take 1 tablet (40 mg total) by mouth daily., Disp: 90 tablet, Rfl: 1 ?  Semaglutide, 1 MG/DOSE, (OZEMPIC, 1 MG/DOSE,) 4 MG/3ML SOPN, Inject 1 mg into the skin once a week., Disp: 9 mL, Rfl: 1 ?  Vitamin D, Ergocalciferol, (DRISDOL) 1.25 MG (50000 UNIT) CAPS capsule, Take 1 capsule (50,000 Units total) by mouth every 14 (fourteen) days., Disp: 6 capsule, Rfl: 1 ? ?No Known Allergies ? ?I personally reviewed active problem list, medication list, allergies, family history, social history, health maintenance with the patient/caregiver today. ? ? ?ROS ? ?Constitutional: Negative for fever , positive for  weight change.  ?Respiratory: Negative for cough and shortness of breath.   ?Cardiovascular: Negative for chest pain or palpitations.  ?Gastrointestinal: Negative for abdominal pain, positive for some bowel changes.  ?Musculoskeletal: Negative for gait problem or joint swelling.  ?Skin: Negative for rash.  ?Neurological: Negative for dizziness or headache.  ?No other specific complaints in a complete review of systems (except as listed in HPI above).   ? ?Objective ? ?Vitals:  ? 04/04/21 0852  ?BP: 120/68  ?Pulse: 92  ?Resp: 16  ?SpO2: 98%  ?Weight: 228 lb (103.4 kg)  ?Height: _0  (1.702 m)  ? ? ?Body mass index is 35.71 kg/m?. ? ?Physical Exam ? ?Constit

## 2021-04-04 ENCOUNTER — Ambulatory Visit (INDEPENDENT_AMBULATORY_CARE_PROVIDER_SITE_OTHER): Payer: Managed Care, Other (non HMO) | Admitting: Family Medicine

## 2021-04-04 ENCOUNTER — Encounter: Payer: Self-pay | Admitting: Family Medicine

## 2021-04-04 VITALS — BP 120/68 | HR 92 | Resp 16 | Ht 67.0 in | Wt 228.0 lb

## 2021-04-04 DIAGNOSIS — E538 Deficiency of other specified B group vitamins: Secondary | ICD-10-CM

## 2021-04-04 DIAGNOSIS — E559 Vitamin D deficiency, unspecified: Secondary | ICD-10-CM

## 2021-04-04 DIAGNOSIS — E1169 Type 2 diabetes mellitus with other specified complication: Secondary | ICD-10-CM

## 2021-04-04 DIAGNOSIS — I10 Essential (primary) hypertension: Secondary | ICD-10-CM | POA: Diagnosis not present

## 2021-04-04 DIAGNOSIS — Z794 Long term (current) use of insulin: Secondary | ICD-10-CM

## 2021-04-04 DIAGNOSIS — E1129 Type 2 diabetes mellitus with other diabetic kidney complication: Secondary | ICD-10-CM | POA: Diagnosis not present

## 2021-04-04 DIAGNOSIS — E892 Postprocedural hypoparathyroidism: Secondary | ICD-10-CM

## 2021-04-04 DIAGNOSIS — Z9089 Acquired absence of other organs: Secondary | ICD-10-CM

## 2021-04-04 DIAGNOSIS — E785 Hyperlipidemia, unspecified: Secondary | ICD-10-CM

## 2021-04-04 DIAGNOSIS — R809 Proteinuria, unspecified: Secondary | ICD-10-CM

## 2021-04-04 DIAGNOSIS — E039 Hypothyroidism, unspecified: Secondary | ICD-10-CM

## 2021-04-04 MED ORDER — AMLODIPINE BESY-BENAZEPRIL HCL 5-20 MG PO CAPS: 1.0000 | ORAL_CAPSULE | Freq: Every day | ORAL | 1 refills | Status: DC

## 2021-04-04 MED ORDER — VITAMIN D (ERGOCALCIFEROL) 1.25 MG (50000 UNIT) PO CAPS: 50000.0000 [IU] | ORAL_CAPSULE | ORAL | 1 refills | Status: DC

## 2021-04-04 MED ORDER — B-12 1000 MCG SL SUBL: 1.0000 | SUBLINGUAL_TABLET | SUBLINGUAL | 0 refills | Status: DC

## 2021-04-04 MED ORDER — OZEMPIC (1 MG/DOSE) 4 MG/3ML ~~LOC~~ SOPN: 1.0000 mg | PEN_INJECTOR | SUBCUTANEOUS | 1 refills | Status: DC

## 2021-04-04 MED ORDER — ROSUVASTATIN CALCIUM 40 MG PO TABS: 40.0000 mg | ORAL_TABLET | Freq: Every day | ORAL | 1 refills | Status: DC

## 2021-04-20 ENCOUNTER — Other Ambulatory Visit: Payer: Self-pay | Admitting: Family Medicine

## 2021-04-20 DIAGNOSIS — E039 Hypothyroidism, unspecified: Secondary | ICD-10-CM

## 2021-04-21 ENCOUNTER — Other Ambulatory Visit: Payer: Self-pay

## 2021-04-21 DIAGNOSIS — E039 Hypothyroidism, unspecified: Secondary | ICD-10-CM

## 2021-04-22 ENCOUNTER — Ambulatory Visit: Payer: Managed Care, Other (non HMO) | Admitting: Family Medicine

## 2021-04-22 ENCOUNTER — Encounter: Payer: Self-pay | Admitting: Family Medicine

## 2021-04-22 VITALS — BP 122/62 | Ht 67.0 in | Wt 226.0 lb

## 2021-04-22 DIAGNOSIS — N870 Mild cervical dysplasia: Secondary | ICD-10-CM | POA: Diagnosis not present

## 2021-04-22 NOTE — Progress Notes (Signed)
? ? ?  GYNECOLOGY   VISIT ENCOUNTER NOTE ? ?Subjective:  ? Tabitha Shaw is a 61 y.o. G1P1 female here for repeat pap. Denies abnormal vaginal bleeding, discharge, pelvic pain, problems with intercourse or other gynecologic concerns.  ?  ?Gynecologic History ?No LMP recorded. Patient has had a hysterectomy. ? ?Contraception: post menopausal status ? ?Health Maintenance Due  ?Topic Date Due  ? COVID-19 Vaccine (3 - Pfizer risk series) 05/25/2019  ? OPHTHALMOLOGY EXAM  07/16/2020  ? PAP SMEAR-Modifier  04/23/2021  ? ? ?The following portions of the patient's history were reviewed and updated as appropriate: allergies, current medications, past family history, past medical history, past social history, past surgical history and problem list. ? ?Review of Systems ?Pertinent items are noted in HPI. ?  ?Objective:  ?BP 122/62   Ht '5\' 7"'$  (1.702 m)   Wt 226 lb (102.5 kg)   BMI 35.40 kg/m?  ?Gen: well appearing, NAD ?HEENT: no scleral icterus ?CV: RR ?Lung: Normal WOB ?Ext: warm well perfused ? ?PELVIC: Normal appearing external genitalia; normal appearing vaginal mucosa and cervix.  No abnormal discharge noted.  Pap smear obtained.  Normal uterine size, no other palpable masses, no uterine or adnexal tenderness. ? ? ?Assessment and Plan:  ? ?1. Dysplasia of cervix, low grade (CIN 1) ?- IGP, Aptima HPV, rfx 16/18,45 ? ?Please refer to After Visit Summary for other counseling recommendations.  ? ?Return in about 1 year (around 04/23/2022) for Yearly wellness exam. ? ?Caren Macadam, MD, MPH, ABFM ?Attending Physician ?[place] [facility] [place] ? ?

## 2021-04-27 ENCOUNTER — Encounter: Payer: Self-pay | Admitting: Family Medicine

## 2021-04-28 LAB — HM DIABETES EYE EXAM

## 2021-04-29 ENCOUNTER — Encounter: Payer: Self-pay | Admitting: Family Medicine

## 2021-04-29 LAB — IGP, APTIMA HPV, RFX 16/18,45: HPV Aptima: POSITIVE — AB

## 2021-04-29 LAB — HPV GENOTYPES 16/18,45
HPV Genotype 16: NEGATIVE
HPV Genotype 18,45: NEGATIVE

## 2021-06-19 ENCOUNTER — Encounter: Payer: Self-pay | Admitting: Family Medicine

## 2021-06-19 DIAGNOSIS — R809 Proteinuria, unspecified: Secondary | ICD-10-CM

## 2021-06-20 ENCOUNTER — Ambulatory Visit (INDEPENDENT_AMBULATORY_CARE_PROVIDER_SITE_OTHER): Payer: Managed Care, Other (non HMO)

## 2021-06-20 DIAGNOSIS — E538 Deficiency of other specified B group vitamins: Secondary | ICD-10-CM

## 2021-06-20 MED ORDER — CYANOCOBALAMIN 1000 MCG/ML IJ SOLN
1000.0000 ug | Freq: Once | INTRAMUSCULAR | Status: AC
  Administered 2021-06-20: 1000 ug via INTRAMUSCULAR

## 2021-06-24 ENCOUNTER — Other Ambulatory Visit: Payer: Self-pay

## 2021-06-24 MED ORDER — ACCU-CHEK SOFTCLIX LANCETS MISC: 12 refills | Status: DC

## 2021-06-24 MED ORDER — ACCU-CHEK GUIDE VI STRP: ORAL_STRIP | 12 refills | Status: DC

## 2021-06-25 ENCOUNTER — Other Ambulatory Visit: Payer: Self-pay | Admitting: Dermatology

## 2021-06-25 DIAGNOSIS — L659 Nonscarring hair loss, unspecified: Secondary | ICD-10-CM

## 2021-06-26 ENCOUNTER — Other Ambulatory Visit: Payer: Self-pay

## 2021-06-26 DIAGNOSIS — E1129 Type 2 diabetes mellitus with other diabetic kidney complication: Secondary | ICD-10-CM

## 2021-06-26 MED ORDER — BLOOD GLUCOSE METER KIT: PACK | 0 refills | Status: DC

## 2021-07-07 ENCOUNTER — Other Ambulatory Visit: Payer: Self-pay | Admitting: Family Medicine

## 2021-07-07 ENCOUNTER — Encounter: Payer: Self-pay | Admitting: Oncology

## 2021-07-07 DIAGNOSIS — E1129 Type 2 diabetes mellitus with other diabetic kidney complication: Secondary | ICD-10-CM

## 2021-07-14 ENCOUNTER — Other Ambulatory Visit: Payer: Self-pay

## 2021-07-14 DIAGNOSIS — R809 Proteinuria, unspecified: Secondary | ICD-10-CM

## 2021-07-14 MED ORDER — ACCU-CHEK SOFTCLIX LANCETS MISC: 12 refills | Status: DC

## 2021-07-15 ENCOUNTER — Inpatient Hospital Stay: Payer: Managed Care, Other (non HMO)

## 2021-07-15 ENCOUNTER — Inpatient Hospital Stay: Payer: Managed Care, Other (non HMO) | Attending: Oncology | Admitting: Oncology

## 2021-07-15 ENCOUNTER — Encounter: Payer: Self-pay | Admitting: Oncology

## 2021-07-15 DIAGNOSIS — Z87891 Personal history of nicotine dependence: Secondary | ICD-10-CM | POA: Diagnosis not present

## 2021-07-16 ENCOUNTER — Encounter: Payer: Self-pay | Admitting: Family Medicine

## 2021-07-17 ENCOUNTER — Ambulatory Visit: Payer: Managed Care, Other (non HMO) | Admitting: Family Medicine

## 2021-07-18 ENCOUNTER — Encounter: Payer: Self-pay | Admitting: Physician Assistant

## 2021-07-18 ENCOUNTER — Other Ambulatory Visit: Payer: Self-pay | Admitting: Physician Assistant

## 2021-07-18 ENCOUNTER — Ambulatory Visit (INDEPENDENT_AMBULATORY_CARE_PROVIDER_SITE_OTHER): Payer: Managed Care, Other (non HMO) | Admitting: Physician Assistant

## 2021-07-18 VITALS — BP 108/72 | HR 91 | Temp 98.1°F | Resp 16 | Ht 67.0 in | Wt 220.6 lb

## 2021-07-18 DIAGNOSIS — I10 Essential (primary) hypertension: Secondary | ICD-10-CM

## 2021-07-18 DIAGNOSIS — I959 Hypotension, unspecified: Secondary | ICD-10-CM | POA: Diagnosis not present

## 2021-07-18 MED ORDER — BENAZEPRIL HCL 20 MG PO TABS: 20.0000 mg | ORAL_TABLET | Freq: Every day | ORAL | 1 refills | Status: DC

## 2021-07-18 NOTE — Assessment & Plan Note (Signed)
Chronic, historic condition Managed with Lotrel 5-20 mg PO QD and was on Spironolactone from [employer] she is having episodes of hypotension with intermittent symptomatic concerns- feeling lightheaded, sweating, weakness States when she refrains from Spironolactone and BP meds her BP comes back up to what it was in office today Recommend she dc Spironolactone, and will change from Lotrel to just Benazepril 20 mg PO QD  Recommend she take BP 1-2 times per day and record, return to office in 4 weeks to assess response- she has already scheduled follow up with PCP in about 3 weeks, okay to keep this apt for follow up Recommend she stay well hydrated and eat regularly to avoid further issues

## 2021-07-18 NOTE — Patient Instructions (Addendum)
Based on your symptoms I think we should try the following:  Stop the Spironolactone Stop taking the Amlodipine- Benazepril combination pill  Start Benazepril 20 mg by mouth once per day Take your BP about twice per day and keep a record of it for your follow up. If you have another episode where you feel faint or weak, sit down safely and try taking it then to see if it is low then as well.  Stay well hydrated, at least 64 oz of water per day.  Try to stay active and eat regularly  Keep your regularly scheduled upcoming apt with your PCP.  If you have further concerns let us know and we can try to get you back in before that.   It was nice to meet you and I appreciate the opportunity to be involved in your care

## 2021-07-18 NOTE — Telephone Encounter (Signed)
Requested medications are due for refill today.  no  Requested medications are on the active medications list.  yes  Last refill. 07/18/2021 #30 1 refill  Future visit scheduled.   yes  Notes to clinic.  Pt is requesting a 90 days supply.    Requested Prescriptions  Pending Prescriptions Disp Refills   benazepril (LOTENSIN) 20 MG tablet [Pharmacy Med Name: BENAZEPRIL '20MG'$  TABLETS] 90 tablet     Sig: TAKE 1 TABLET(20 MG) BY MOUTH DAILY     Cardiovascular:  ACE Inhibitors Passed - 07/18/2021  3:20 PM      Passed - Cr in normal range and within 180 days    Creatinine, Ser  Date Value Ref Range Status  04/02/2021 0.86 0.57 - 1.00 mg/dL Final         Passed - K in normal range and within 180 days    Potassium  Date Value Ref Range Status  04/02/2021 4.4 3.5 - 5.2 mmol/L Final         Passed - Patient is not pregnant      Passed - Last BP in normal range    BP Readings from Last 1 Encounters:  07/18/21 108/72         Passed - Valid encounter within last 6 months    Recent Outpatient Visits           Today Hypotension, unspecified hypotension type   [place], Erin E, PA-C   3 months ago Type 2 diabetes mellitus with microalbuminuria, with long-term current use of insulin [facility])   [facility] Steele Sizer, MD   8 months ago History of parathyroidectomy [facility])   [facility] [place], Drue Stager, MD   1 year ago Type 2 diabetes mellitus with microalbuminuria, with long-term current use of insulin [facility])   [facility] [place], Drue Stager, MD   1 year ago Type 2 diabetes mellitus with microalbuminuria, with long-term current use of insulin [facility])   [facility] Steele Sizer, MD       Future Appointments             In 2 weeks Steele Sizer, MD [facility], [place]   In 2 months [facility], [place], MD [place]

## 2021-07-18 NOTE — Progress Notes (Signed)
Established Patient Office Visit  Name: Tabitha Shaw   MRN: [id number]    DOB: [birth year]   Date:07/18/2021  Today's Provider: Talitha Givens, MHS, PA-C Introduced myself to the patient as a PA-C and provided education on APPs in clinical practice.         Subjective  Chief Complaint  Chief Complaint  Patient presents with   Low blood pressure    Pt states was at an oncology appt ready to have blood drawn and was told her BP was low. Pt felt like she was going to faint later that day.    HPI  Reports concerns for low BP at her recent phlebotomy apt. States she went to get dinner and felt faint, sweaty and weak until she had some water and sat down Reports repeat low readings that have improved after holding BP meds Has been taking her BP since that apt and it has predominantly been in 90s/ 70s  She tried not taking her BP medications or the Spironolactone on Wed or today and BP was better She states she drinks about 3 12oz bottles of water per day.  She is eating breakfast, sometimes skipping lunch, she eats dinner    Patient Active Problem List   Diagnosis Date Noted   S/P laparoscopic supracervical hysterectomy 04/23/2020   Dysplasia of cervix, low grade (CIN 1) 11/22/2019   ASCUS with positive high risk HPV cervical 10/24/2019   Personal history of colonic polyps    Polyp of colon    Cervical high risk HPV (human papillomavirus) test positive 09/03/2016   B12 deficiency 09/04/2015   Elevated PTHrP level 09/04/2015   Benign hypertension 09/12/2014   Acanthosis nigricans 09/09/2014   Diabetes mellitus with renal manifestation ([place]) 09/09/2014   Dyslipidemia 09/09/2014   Abnormal electrocardiogram 09/09/2014   Enlarged kidney 09/09/2014   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) 09/09/2014   Enlarged liver 09/09/2014   Calcium blood increased 09/09/2014   Adult hypothyroidism 09/09/2014   Microalbuminuria 09/09/2014   Morbid obesity due to excess calories ([place])  09/09/2014   Spinal stenosis of lumbar region with neurogenic claudication 09/09/2014   Osteopenia 09/09/2014   Vitamin D deficiency 09/09/2014    Past Surgical History:  Procedure Laterality Date   ABDOMINAL HYSTERECTOMY  2005   blood transfusion yearly     BREAST REDUCTION [place]   COLONOSCOPY WITH PROPOFOL N/A 11/11/2018   Procedure: COLONOSCOPY WITH PROPOFOL;  Surgeon: Virgel Manifold, MD;  Location: [facility] ENDOSCOPY;  Service: Endoscopy;  Laterality: N/A;   COLONOSCOPY WITH PROPOFOL N/A 02/16/2020   Procedure: COLONOSCOPY WITH PROPOFOL;  Surgeon: Virgel Manifold, MD;  Location: [facility] ENDOSCOPY;  Service: Endoscopy;  Laterality: N/A;   lapcholecystectomy     PARATHYROIDECTOMY Right 11/26/2017   right lower , Dr. Maudie Mercury at [place] W/ [facility]      Family History  Problem Relation Age of Onset   Colon polyps Mother    Hemachromatosis Mother    Hypertension Mother    Depression Sister    Hypertension Brother    Colon polyps Brother    Cancer Father    Breast cancer Cousin    Breast cancer Cousin     Social History   Tobacco Use   Smoking status: Former    Packs/day: 0.25  Years: 8.00    Total pack years: 2.00    Types: Cigarettes    Start date: 01/19/1982    Quit date: 01/19/1990    Years since quitting: 31.5   Smokeless tobacco: Never  Substance Use Topics   Alcohol use: Yes    Alcohol/week: 0.0 standard drinks of alcohol    Comment: occasionally     Current Outpatient Medications:    Accu-Chek Softclix Lancets lancets, Use with glucometer to check CBG 1-3 times daily PRN Dx:E11.65, Disp: 100 each, Rfl: 12   ASPIRIN 81 PO, Take 81 mg by mouth daily as needed., Disp: , Rfl:    Blood Glucose Monitoring Suppl (CONTOUR NEXT EZ) w/Device KIT, USE TO CHECK BLOOD SUGAR TWICE  DAILY AS DIRECTED, Disp: 1 kit, Rfl: 0    Cyanocobalamin (B-12) 1000 MCG SUBL, Place 1 tablet under the tongue 3 (three) times a week., Disp: 30 tablet, Rfl: 0   FARXIGA 10 MG TABS tablet, TAKE 1 TABLET(10 MG) BY MOUTH DAILY BEFORE BREAKFAST, Disp: 90 tablet, Rfl: 1   glucose blood (ACCU-CHEK GUIDE) test strip, Use as instructed, Disp: 100 each, Rfl: 12   levothyroxine (SYNTHROID) 75 MCG tablet, TAKE 1 TABLET(75 MCG) BY MOUTH DAILY, Disp: 30 tablet, Rfl: 0   ONE TOUCH ULTRA TEST test strip, CHECK FASTING BLOOD SUGAR  TWICE WEEKLY, Disp: 100 each, Rfl: 1   rosuvastatin (CRESTOR) 40 MG tablet, Take 1 tablet (40 mg total) by mouth daily., Disp: 90 tablet, Rfl: 1   Semaglutide, 1 MG/DOSE, (OZEMPIC, 1 MG/DOSE,) 4 MG/3ML SOPN, Inject 1 mg into the skin once a week., Disp: 9 mL, Rfl: 1   Vitamin D, Ergocalciferol, (DRISDOL) 1.25 MG (50000 UNIT) CAPS capsule, Take 1 capsule (50,000 Units total) by mouth every 14 (fourteen) days., Disp: 6 capsule, Rfl: 1   amLODipine-benazepril (LOTREL) 5-20 MG capsule, Take 1 capsule by mouth daily. (Patient not taking: Reported on 07/18/2021), Disp: 90 capsule, Rfl: 1   Eflornithine HCl (VANIQA) 13.9 % cream, Apply to affected areas of face BID. (Patient not taking: Reported on 07/15/2021), Disp: 45 g, Rfl: 3   finasteride (PROSCAR) 5 MG tablet, Take 1/2 tablet by mouth daily (Patient not taking: Reported on 07/18/2021), Disp: 15 tablet, Rfl: 3   spironolactone (ALDACTONE) 50 MG tablet, TAKE 1 TABLET(50 MG) BY MOUTH DAILY (Patient not taking: Reported on 07/18/2021), Disp: 90 tablet, Rfl: 0  No Known Allergies  I personally reviewed active problem list, medication list, allergies, notes from last encounter, lab results with the patient/caregiver today.   Review of Systems  Constitutional:  Positive for diaphoresis.  Respiratory:  Negative for cough and shortness of breath.   Cardiovascular:  Negative for chest pain, palpitations and leg swelling.  Gastrointestinal:  Negative for diarrhea, nausea and vomiting.   Musculoskeletal:  Negative for falls.  Neurological:  Positive for dizziness. Negative for loss of consciousness.      Objective  Vitals:   07/18/21 1435  BP: 108/72  Pulse: 91  Resp: 16  Temp: 98.1 F (36.7 C)  TempSrc: Oral  SpO2: 100%  Weight: 220 lb 9.6 oz (100.1 kg)  Height: _0  (1.702 m)    Body mass index is 34.55 kg/m.  Physical Exam Vitals reviewed.  Constitutional:      General: She is awake.     Appearance: Normal appearance. She is well-developed and well-groomed. She is obese.  HENT:     Head: Normocephalic and atraumatic.  Cardiovascular:     Rate and Rhythm: Normal rate  and regular rhythm.     Pulses: Normal pulses.     Heart sounds: Normal heart sounds.  Musculoskeletal:     Right lower leg: No edema.     Left lower leg: No edema.  Neurological:     General: No focal deficit present.     Mental Status: She is alert and oriented to person, place, and time.     GCS: GCS eye subscore is 4. GCS verbal subscore is 5. GCS motor subscore is 6.     Cranial Nerves: No dysarthria or facial asymmetry.     Motor: No weakness or tremor.  Psychiatric:        Attention and Perception: Attention and perception normal.        Mood and Affect: Mood and affect normal.        Speech: Speech normal.        Behavior: Behavior normal. Behavior is cooperative.      Recent Results (from the past 2160 hour(s))  IGP, Aptima HPV, rfx 16/18,45     Status: Abnormal   Collection Time: 04/22/21  4:45 PM  Result Value Ref Range   Interpretation NILM     Comment: NEGATIVE FOR INTRAEPITHELIAL LESION OR MALIGNANCY.   Category NIL     Comment: Negative for Intraepithelial Lesion   Adequacy SDER     Comment: Satisfactory for evaluation.   Clinician Provided ICD10 Comment     Comment: N87.0   Performed by: Comment     Comment: Lamar Benes, Cytotechnologist (ASCP)   Note: Comment     Comment: The Pap smear is a screening test designed to aid in the detection  of premalignant and malignant conditions of the uterine cervix.  It is not a diagnostic procedure and should not be used as the sole means of detecting cervical cancer.  Both false-positive and false-negative reports do occur.    Test Methodology Comment     Comment: This liquid based ThinPrep(R) pap test was screened with the use of an image guided system.    HPV Aptima Positive (A) Negative    Comment: This nucleic acid amplification test detects fourteen high-risk HPV types (16,18,31,33,35,39,45,51,52,56,58,59,66,68) without differentiation.    HPV Genotype Reflex Comment     Comment: Criteria met, see HPV Genotype results.  HPV Genotypes 16/18,45     Status: None   Collection Time: 04/22/21  4:45 PM  Result Value Ref Range   HPV Genotype 16 Negative Negative   HPV Genotype 18,45 Negative Negative     PHQ2/9:    07/18/2021    2:34 PM 04/04/2021    8:52 AM 10/23/2020    1:15 PM 02/28/2020    7:37 AM 11/22/2019    7:48 AM  Depression screen PHQ 2/9  Decreased Interest 0 0 0 0 0  Down, Depressed, Hopeless 0 0 0 0 0  PHQ - 2 Score 0 0 0 0 0  Altered sleeping 0 0     Tired, decreased energy 0 0     Change in appetite 0 0     Feeling bad or failure about yourself  0 0     Trouble concentrating 0 0     Moving slowly or fidgety/restless 0 0     Suicidal thoughts 0 0     PHQ-9 Score 0 0     Difficult doing work/chores Not difficult at all          Fall Risk:    07/18/2021    2:34 PM  04/04/2021    8:52 AM 10/23/2020    1:15 PM 02/28/2020    7:37 AM 11/22/2019    7:48 AM  Fall Risk   Falls in the past year? 0 0 0 0 0  Number falls in past yr: 0 0 0 0 0  Injury with Fall? 0 0 0 0 0  Risk for fall due to : No Fall Risks No Fall Risks No Fall Risks    Follow up Falls prevention discussed;Education provided Falls prevention discussed Falls prevention discussed        Functional Status Survey: Is the patient deaf or have difficulty hearing?: No Does the patient have  difficulty seeing, even when wearing glasses/contacts?: No Does the patient have difficulty concentrating, remembering, or making decisions?: No Does the patient have difficulty walking or climbing stairs?: No Does the patient have difficulty dressing or bathing?: No Does the patient have difficulty doing errands alone such as visiting a doctor's office or shopping?: No    Assessment & Plan  Problem List Items Addressed This Visit       Cardiovascular and Mediastinum   Benign hypertension    Chronic, historic condition Managed with Lotrel 5-20 mg PO QD and was on Spironolactone from [employer] she is having episodes of hypotension with intermittent symptomatic concerns- feeling lightheaded, sweating, weakness States when she refrains from Spironolactone and BP meds her BP comes back up to what it was in office today Recommend she dc Spironolactone, and will change from Lotrel to just Benazepril 20 mg PO QD  Recommend she take BP 1-2 times per day and record, return to office in 4 weeks to assess response- she has already scheduled follow up with PCP in about 3 weeks, okay to keep this apt for follow up Recommend she stay well hydrated and eat regularly to avoid further issues       Relevant Medications   ASPIRIN 81 PO   benazepril (LOTENSIN) 20 MG tablet   Other Visit Diagnoses     Hypotension, unspecified hypotension type    -  Primary Acute, new problem See Hypertension management plan for today's strategy.     Relevant Medications   ASPIRIN 81 PO   benazepril (LOTENSIN) 20 MG tablet   Other Relevant Orders   CBC w/Diff/Platelet   Comprehensive Metabolic Panel (CMET)        No follow-ups on file.   I, Latoyia Tecson E Sophiamarie Nease, PA-C, have reviewed all documentation for this visit. The documentation on 07/18/21 for the exam, diagnosis, procedures, and orders are all accurate and complete.   Talitha Givens, MHS, PA-C [facility]

## 2021-07-19 LAB — CBC WITH DIFFERENTIAL/PLATELET
Basophils Absolute: 0.1 10*3/uL (ref 0.0–0.2)
Basos: 1 %
EOS (ABSOLUTE): 0.1 10*3/uL (ref 0.0–0.4)
Eos: 1 %
Hematocrit: 40.9 % (ref 34.0–46.6)
Hemoglobin: 14.5 g/dL (ref 11.1–15.9)
Immature Grans (Abs): 0 10*3/uL (ref 0.0–0.1)
Immature Granulocytes: 0 %
Lymphocytes Absolute: 4.7 10*3/uL — ABNORMAL HIGH (ref 0.7–3.1)
Lymphs: 46 %
MCH: 36.8 pg — ABNORMAL HIGH (ref 26.6–33.0)
MCHC: 35.5 g/dL (ref 31.5–35.7)
MCV: 104 fL — ABNORMAL HIGH (ref 79–97)
Monocytes Absolute: 0.6 10*3/uL (ref 0.1–0.9)
Monocytes: 6 %
Neutrophils Absolute: 4.8 10*3/uL (ref 1.4–7.0)
Neutrophils: 46 %
Platelets: 253 10*3/uL (ref 150–450)
RBC: 3.94 x10E6/uL (ref 3.77–5.28)
RDW: 12.1 % (ref 11.7–15.4)
WBC: 10.3 10*3/uL (ref 3.4–10.8)

## 2021-07-19 LAB — COMPREHENSIVE METABOLIC PANEL
ALT: 25 IU/L (ref 0–32)
AST: 21 IU/L (ref 0–40)
Albumin/Globulin Ratio: 1.8 (ref 1.2–2.2)
Albumin: 4.8 g/dL (ref 3.8–4.9)
Alkaline Phosphatase: 58 IU/L (ref 44–121)
BUN/Creatinine Ratio: 12 (ref 9–23)
BUN: 11 mg/dL (ref 6–24)
Bilirubin Total: 0.8 mg/dL (ref 0.0–1.2)
CO2: 24 mmol/L (ref 20–29)
Calcium: 10.1 mg/dL (ref 8.7–10.2)
Chloride: 99 mmol/L (ref 96–106)
Creatinine, Ser: 0.91 mg/dL (ref 0.57–1.00)
Globulin, Total: 2.6 g/dL (ref 1.5–4.5)
Glucose: 98 mg/dL (ref 70–99)
Potassium: 4.2 mmol/L (ref 3.5–5.2)
Sodium: 139 mmol/L (ref 134–144)
Total Protein: 7.4 g/dL (ref 6.0–8.5)
eGFR: 73 mL/min/{1.73_m2} (ref >59)

## 2021-07-19 LAB — PTH, INTACT AND CALCIUM
Calcium: 10.3 mg/dL — ABNORMAL HIGH (ref 8.7–10.2)
PTH: 35 pg/mL (ref 15–65)

## 2021-07-19 LAB — MICROALBUMIN / CREATININE URINE RATIO
Creatinine, Urine: 140.7 mg/dL
Microalb/Creat Ratio: 58 mg/g creat — ABNORMAL HIGH (ref 0–29)
Microalbumin, Urine: 81.2 ug/mL

## 2021-07-19 LAB — TSH: TSH: 0.08 u[IU]/mL — ABNORMAL LOW (ref 0.450–4.500)

## 2021-07-21 ENCOUNTER — Other Ambulatory Visit: Payer: Self-pay

## 2021-07-21 DIAGNOSIS — E892 Postprocedural hypoparathyroidism: Secondary | ICD-10-CM

## 2021-07-21 DIAGNOSIS — E039 Hypothyroidism, unspecified: Secondary | ICD-10-CM

## 2021-07-22 ENCOUNTER — Encounter: Payer: Self-pay | Admitting: Family Medicine

## 2021-07-23 ENCOUNTER — Other Ambulatory Visit: Payer: Self-pay

## 2021-07-23 DIAGNOSIS — E039 Hypothyroidism, unspecified: Secondary | ICD-10-CM

## 2021-07-23 DIAGNOSIS — E892 Postprocedural hypoparathyroidism: Secondary | ICD-10-CM

## 2021-07-23 NOTE — Telephone Encounter (Signed)
Lab added through [facility]

## 2021-07-29 ENCOUNTER — Encounter: Payer: Self-pay | Admitting: Family Medicine

## 2021-07-31 ENCOUNTER — Encounter: Payer: Self-pay | Admitting: Family Medicine

## 2021-08-04 NOTE — Telephone Encounter (Signed)
I have called pt to inform her they do not have any order for T3 and T4. Pt stated she meant for that order to be added on to her labs results from 07/18/21. I told patient unfortunately there was some miscommunication and due to that she would have to get her blood drawn again.

## 2021-08-05 NOTE — Progress Notes (Unsigned)
Name: Tabitha Shaw   MRN: [id number]    DOB: 1960/11/17   Date:08/06/2021       Progress Note  Subjective  Chief Complaint  Follow Up  HPI  DMII : hgbA1C spiked to 8.1% back in August 2020,last A1C was 4.9 %    She is compliant  Ozempic and Farxiga for urine microalbuminuria that improved on her last check. Denies polyphagia, polydipsia or polyuria, but she has nocturia once per night - stable. LDL is at goal    HTN: she was on Lotrel however developed hypotension during a visit to hematologist and got dizzy, she was seen here by our PA and lotrel was stopped, she is on Benazepril 20 mg only and bp is still low, we will go down to 10 mg dose and monitor. She has lost weight and is also on Farxiga  Hyperlipidemia: she was taking Atorvastatin but LDL was high at 162 , she is currently on Crestor and we will recheck labs    Hypothyroidism: she is taking Synthroid 65mg  daily but only half on Sundays since last checked. Denies diarrhea or palpitation, bp has been low, not high    Obesity: BMI is now below 35 , she has been eating more vegetables, drinking more water, avoiding sodas, taking Ozempic and FIran. Her weight is down from 250 lbs Fall 2022  to 223 lbs today    History of Primary  Hyperparathyroidism: seen by Dr. SGabriel Carinaand referred to Dr. KMaudie Mercury found to have a right lower parathyroid enlargement and had surgery on 11/26/2017, and has been released since, last Pth was normal, we will check ionized calcium since pth was normal last month    Hemochromatosis : sees Dr FGrayland Ormondand has phlebotomy . Last treatment was 12/2019 ,removal of 500 ml.  Memory difficulties: she is worried because daughter noticed she has been forgetful, there is a family history of Alzheimer's dementia, and her 23&Me test was positive. Discussed MMS, signs and symptoms of Alzheimer's , she has been working on puzzles every night. She saw Dr. SManuella Ghazi she was diagnosed with mild cognitive dysfunction, she has  been compliant with CPAP. Dr. SManuella Ghaziadvised her to take a baby aspirin and no further follow ups required   Supra Sellar Mass: found on MRI brain, had it repeated 02/23   1. 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 Otherwise unremarkable brain MRI. 2. Essentially stable NeuroQuant volumetric analysis of the brain, see details on CBJ's     History of CIN1: she was seen by Dr. HKenton Kingfisher she had repeat pap smear April 2022 that was normal , she had a repeat pap smear done April 2023 that was normal but positive HPV   OSA:she has the CPAP machine now , but only able to tolerate a few hours most nights of the week, but at least 4 nights a week she is able to tolerate it all night   Vitamin D: last vitamin D was towards high end normal, she is now taking vitamin D every other week.   Vitamin B12 deficiency: she would like to get injections B12, we will recheck level and if low we will give her monthly B12 injections, currently not taking SL B12   Sciatica : pain from outer hip down to her right ankle, waking her up at night., she has seen chiropractor in the past and she will go back for follow up.     Patient Active  Problem List   Diagnosis Date Noted   S/P laparoscopic supracervical hysterectomy 04/23/2020   Dysplasia of cervix, low grade (CIN 1) 11/22/2019   ASCUS with positive high risk HPV cervical 10/24/2019   Personal history of colonic polyps    Polyp of colon    Cervical high risk HPV (human papillomavirus) test positive 09/03/2016   B12 deficiency 09/04/2015   Elevated PTHrP level 09/04/2015   Benign hypertension 09/12/2014   Acanthosis nigricans 09/09/2014   Diabetes mellitus with renal manifestation ([place]) 09/09/2014   Dyslipidemia 09/09/2014   Abnormal electrocardiogram 09/09/2014   Enlarged kidney 09/09/2014   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[place]) 09/09/2014   Enlarged liver 09/09/2014   Calcium blood increased 09/09/2014    Adult hypothyroidism 09/09/2014   Microalbuminuria 09/09/2014   Morbid obesity due to excess calories ([place]) 09/09/2014   Spinal stenosis of lumbar region with neurogenic claudication 09/09/2014   Osteopenia 09/09/2014   Vitamin D deficiency 09/09/2014    Past Surgical History:  Procedure Laterality Date   ABDOMINAL HYSTERECTOMY  2005   blood transfusion yearly     BREAST REDUCTION [place]   COLONOSCOPY WITH PROPOFOL N/A 11/11/2018   Procedure: COLONOSCOPY WITH PROPOFOL;  Surgeon: Virgel Manifold, MD;  Location: [facility] ENDOSCOPY;  Service: Endoscopy;  Laterality: N/A;   COLONOSCOPY WITH PROPOFOL N/A 02/16/2020   Procedure: COLONOSCOPY WITH PROPOFOL;  Surgeon: Virgel Manifold, MD;  Location: [facility] ENDOSCOPY;  Service: Endoscopy;  Laterality: N/A;   lapcholecystectomy     PARATHYROIDECTOMY Right 11/26/2017   right lower , Dr. Maudie Mercury at [place] W/ [facility]      Family History  Problem Relation Age of Onset   Colon polyps Mother    Hemachromatosis Mother    Hypertension Mother    Depression Sister    Hypertension Brother    Colon polyps Brother    Cancer Father    Breast cancer Cousin    Breast cancer Cousin     Social History   Tobacco Use   Smoking status: Former    Packs/day: 0.25    Years: 8.00    Total pack years: 2.00    Types: Cigarettes    Start date: 01/19/1982    Quit date: 01/19/1990    Years since quitting: 31.5   Smokeless tobacco: Never  Substance Use Topics   Alcohol use: Yes    Alcohol/week: 0.0 standard drinks of alcohol    Comment: occasionally     Current Outpatient Medications:    ASPIRIN 81 PO, Take 81 mg by mouth daily as needed., Disp: , Rfl:    benazepril (LOTENSIN) 20 MG tablet, Take 1 tablet (20 mg total) by mouth daily., Disp: 30 tablet, Rfl: 1   Blood Glucose Monitoring Suppl (CONTOUR NEXT EZ) w/Device  KIT, USE TO CHECK BLOOD SUGAR TWICE  DAILY AS DIRECTED, Disp: 1 kit, Rfl: 0   FARXIGA 10 MG TABS tablet, TAKE 1 TABLET(10 MG) BY MOUTH DAILY BEFORE BREAKFAST, Disp: 90 tablet, Rfl: 1   levothyroxine (SYNTHROID) 75 MCG tablet, TAKE 1 TABLET(75 MCG) BY MOUTH DAILY, Disp: 30 tablet, Rfl: 0   ONE TOUCH ULTRA TEST test strip, CHECK FASTING BLOOD SUGAR  TWICE WEEKLY, Disp: 100 each, Rfl: 1   rosuvastatin (CRESTOR) 40 MG tablet, Take 1 tablet (40 mg total) by mouth daily., Disp: 90 tablet, Rfl: 1   Semaglutide,  1 MG/DOSE, (OZEMPIC, 1 MG/DOSE,) 4 MG/3ML SOPN, Inject 1 mg into the skin once a week., Disp: 9 mL, Rfl: 1   Vitamin D, Ergocalciferol, (DRISDOL) 1.25 MG (50000 UNIT) CAPS capsule, Take 1 capsule (50,000 Units total) by mouth every 14 (fourteen) days., Disp: 6 capsule, Rfl: 1   Accu-Chek Softclix Lancets lancets, Use with glucometer to check CBG 1-3 times daily PRN Dx:E11.65 (Patient not taking: Reported on 08/06/2021), Disp: 100 each, Rfl: 12   Cyanocobalamin (B-12) 1000 MCG SUBL, Place 1 tablet under the tongue 3 (three) times a week. (Patient not taking: Reported on 08/06/2021), Disp: 30 tablet, Rfl: 0  No Known Allergies  I personally reviewed active problem list, medication list, allergies, family history, social history, health maintenance with the patient/caregiver today.   ROS  Constitutional: Negative for fever , positive for weight change.  Respiratory: Negative for cough and shortness of breath.   Cardiovascular: Negative for chest pain or palpitations.  Gastrointestinal: Negative for abdominal pain, no bowel changes.  Musculoskeletal: Negative for gait problem or joint swelling.  Skin: Negative for rash.  Neurological: Negative for dizziness or headache.  No other specific complaints in a complete review of systems (except as listed in HPI above).   Objective  Vitals:   08/06/21 0819  BP: 110/68  Pulse: 95  Resp: 16  SpO2: 96%  Weight: 223 lb (101.2 kg)  Height: 5' 7"   (1.702 m)    Body mass index is 34.93 kg/m.  Physical Exam  Constitutional: Patient appears well-developed and well-nourished. Obese  No distress.  HEENT: head atraumatic, normocephalic, pupils equal and reactive to light, neck supple Cardiovascular: Normal rate, regular rhythm and normal heart sounds.  No murmur heard. No BLE edema. Pulmonary/Chest: Effort normal and breath sounds normal. No respiratory distress. Abdominal: Soft.  There is no tenderness. Psychiatric: Patient has a normal mood and affect. behavior is normal. Judgment and thought content normal.   Recent Results (from the past 2160 hour(s))  CBC with Differential/Platelet     Status: Abnormal   Collection Time: 07/18/21  3:51 PM  Result Value Ref Range   WBC 10.3 3.4 - 10.8 x10E3/uL   RBC 3.94 3.77 - 5.28 x10E6/uL   Hemoglobin 14.5 11.1 - 15.9 g/dL   Hematocrit 40.9 34.0 - 46.6 %   MCV 104 (H) 79 - 97 fL   MCH 36.8 (H) 26.6 - 33.0 pg   MCHC 35.5 31.5 - 35.7 g/dL   RDW 12.1 11.7 - 15.4 %   Platelets 253 150 - 450 x10E3/uL   Neutrophils 46 Not Estab. %   Lymphs 46 Not Estab. %   Monocytes 6 Not Estab. %   Eos 1 Not Estab. %   Basos 1 Not Estab. %   Neutrophils Absolute 4.8 1.4 - 7.0 x10E3/uL   Lymphocytes Absolute 4.7 (H) 0.7 - 3.1 x10E3/uL   Monocytes Absolute 0.6 0.1 - 0.9 x10E3/uL   EOS (ABSOLUTE) 0.1 0.0 - 0.4 x10E3/uL   Basophils Absolute 0.1 0.0 - 0.2 x10E3/uL   Immature Granulocytes 0 Not Estab. %   Immature Grans (Abs) 0.0 0.0 - 0.1 x10E3/uL  Comprehensive metabolic panel     Status: None   Collection Time: 07/18/21  3:51 PM  Result Value Ref Range   Glucose 98 70 - 99 mg/dL   BUN 11 6 - 24 mg/dL   Creatinine, Ser 0.91 0.57 - 1.00 mg/dL   eGFR 73 >59 mL/min/1.73   BUN/Creatinine Ratio 12 9 - 23   Sodium 139  134 - 144 mmol/L   Potassium 4.2 3.5 - 5.2 mmol/L   Chloride 99 96 - 106 mmol/L   CO2 24 20 - 29 mmol/L   Calcium 10.1 8.7 - 10.2 mg/dL   Total Protein 7.4 6.0 - 8.5 g/dL   Albumin 4.8  3.8 - 4.9 g/dL    Comment:                **Effective July 28, 2021 Albumin reference interval**                  will be changing to:                             Age                  Female          Female                            0 -   7 days       3.6 - 4.9      3.6 - 4.9                            8 -  30 days       3.5 - 4.6      3.5 - 4.6                            1 -   6 months     3.7 - 4.8      3.7 - 4.8                     7 months -   2 years      4.0 - 5.0      4.0 - 5.0                            3 -   5 years      4.1 - 5.0      4.1 - 5.0                            6 -  12 years      4.2 - 5.0      4.2 - 5.0                           13 -  30 years      4.3 - 5.2      4.0 - 5.0                           31 -  50 years      4.1 - 5.1      3.9 - 4.9                           51 -  60 years      3.8 - 4.9      3.8 - 4.9  61 -  70 years      3.9 - 4.9      3.9 - 4.9                           71 -  80 years      3.8 - 4.8      3.8 - 4.8                           8 1  -  89 years      3.7 - 4.7      3.7 - 4.7                           90 - 199 years      3.6 - 4.6      3.6 - 4.6    Globulin, Total 2.6 1.5 - 4.5 g/dL   Albumin/Globulin Ratio 1.8 1.2 - 2.2   Bilirubin Total 0.8 0.0 - 1.2 mg/dL   Alkaline Phosphatase 58 44 - 121 IU/L   AST 21 0 - 40 IU/L   ALT 25 0 - 32 IU/L  TSH     Status: Abnormal   Collection Time: 07/18/21  3:52 PM  Result Value Ref Range   TSH 0.080 (L) 0.450 - 4.500 uIU/mL  Urine Microalbumin w/creat. ratio     Status: Abnormal   Collection Time: 07/18/21  3:52 PM  Result Value Ref Range   Creatinine, Urine 140.7 Not Estab. mg/dL   Microalbumin, Urine 81.2 Not Estab. ug/mL   Microalb/Creat Ratio 58 (H) 0 - 29 mg/g creat    Comment:                        Normal:                0 -  29                        Moderately increased: 30 - 300                        Severely increased:       >300   PTH, intact and calcium      Status: Abnormal   Collection Time: 07/18/21  3:52 PM  Result Value Ref Range   Calcium 10.3 (H) 8.7 - 10.2 mg/dL   PTH 35 15 - 65 pg/mL   PTH Interp Comment     Comment: Interpretation                 Intact PTH    Calcium                                 (pg/mL)      (mg/dL) Normal                          15 - 65     8.6 - 10.2 Primary Hyperparathyroidism         >65          >10.2 Secondary Hyperparathyroidism       >65          <10.2 Non-Parathyroid Hypercalcemia       <  65          >10.2 Hypoparathyroidism                  <15          < 8.6 Non-Parathyroid Hypocalcemia    15 - 65          < 8.6   POCT HgB A1C     Status: None   Collection Time: 08/06/21  8:24 AM  Result Value Ref Range   Hemoglobin A1C 4.9 4.0 - 5.6 %   HbA1c POC (<> result, manual entry)     HbA1c, POC (prediabetic range)     HbA1c, POC (controlled diabetic range)       PHQ2/9:    08/06/2021    8:21 AM 07/18/2021    2:34 PM 04/04/2021    8:52 AM 10/23/2020    1:15 PM 02/28/2020    7:37 AM  Depression screen PHQ 2/9  Decreased Interest 0 0 0 0 0  Down, Depressed, Hopeless 0 0 0 0 0  PHQ - 2 Score 0 0 0 0 0  Altered sleeping 0 0 0    Tired, decreased energy 0 0 0    Change in appetite 0 0 0    Feeling bad or failure about yourself  0 0 0    Trouble concentrating 0 0 0    Moving slowly or fidgety/restless 0 0 0    Suicidal thoughts 0 0 0    PHQ-9 Score 0 0 0    Difficult doing work/chores  Not difficult at all       phq 9 is negative   Fall Risk:    08/06/2021    8:20 AM 07/18/2021    2:34 PM 04/04/2021    8:52 AM 10/23/2020    1:15 PM 02/28/2020    7:37 AM  Fall Risk   Falls in the past year? 0 0 0 0 0  Number falls in past yr: 0 0 0 0 0  Injury with Fall? 0 0 0 0 0  Risk for fall due to : No Fall Risks No Fall Risks No Fall Risks No Fall Risks   Follow up Falls prevention discussed Falls prevention discussed;Education provided Falls prevention discussed Falls prevention discussed        Functional Status Survey: Is the patient deaf or have difficulty hearing?: No Does the patient have difficulty seeing, even when wearing glasses/contacts?: No Does the patient have difficulty concentrating, remembering, or making decisions?: No Does the patient have difficulty walking or climbing stairs?: No Does the patient have difficulty dressing or bathing?: No Does the patient have difficulty doing errands alone such as visiting a doctor's office or shopping?: No    Assessment & Plan  1. Type 2 diabetes mellitus with microalbuminuria, with long-term current use of insulin (HCC)  - POCT HgB A1C - Semaglutide, 1 MG/DOSE, (OZEMPIC, 1 MG/DOSE,) 4 MG/3ML SOPN; Inject 1 mg into the skin once a week.  Dispense: 9 mL; Refill: 1 - dapagliflozin propanediol (FARXIGA) 10 MG TABS tablet; TAKE 1 TABLET(10 MG) BY MOUTH DAILY BEFORE BREAKFAST  Dispense: 90 tablet; Refill: 0 - glucose blood (CONTOUR NEXT TEST) test strip; 1 each by Other route daily before breakfast. Use as instructed  Dispense: 100 each; Refill: 12  2. Dyslipidemia due to type 2 diabetes mellitus (HCC)  - rosuvastatin (CRESTOR) 40 MG tablet; Take 1 tablet (40 mg total) by mouth daily.  Dispense: 90 tablet; Refill: 1  3.  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[place])  Still seeing hematologist   4. History of parathyroidectomy ([place])  Calcium was up but normal parathyroid we will check ionized calcium today   5. Benign hypertension  - benazepril (LOTENSIN) 10 MG tablet; Take 1 tablet (10 mg total) by mouth daily.  Dispense: 30 tablet; Refill: 0  6. B12 deficiency  - B12 and Folate Panel  7. Hypercalcemia  - Calcium, ionized  8. Vitamin D deficiency  - Vitamin D, Ergocalciferol, (DRISDOL) 1.25 MG (50000 UNIT) CAPS capsule; Take 1 capsule (50,000 Units total) by mouth every 14 (fourteen) days.  Dispense: 6 capsule; Refill: 1  9. Adult hypothyroidism

## 2021-08-06 ENCOUNTER — Ambulatory Visit: Payer: Managed Care, Other (non HMO) | Admitting: Family Medicine

## 2021-08-06 ENCOUNTER — Encounter: Payer: Self-pay | Admitting: Family Medicine

## 2021-08-06 VITALS — BP 110/68 | HR 95 | Resp 16 | Ht 67.0 in | Wt 223.0 lb

## 2021-08-06 DIAGNOSIS — Z794 Long term (current) use of insulin: Secondary | ICD-10-CM | POA: Diagnosis not present

## 2021-08-06 DIAGNOSIS — E785 Hyperlipidemia, unspecified: Secondary | ICD-10-CM

## 2021-08-06 DIAGNOSIS — E039 Hypothyroidism, unspecified: Secondary | ICD-10-CM

## 2021-08-06 DIAGNOSIS — R809 Proteinuria, unspecified: Secondary | ICD-10-CM

## 2021-08-06 DIAGNOSIS — E1129 Type 2 diabetes mellitus with other diabetic kidney complication: Secondary | ICD-10-CM | POA: Diagnosis not present

## 2021-08-06 DIAGNOSIS — I10 Essential (primary) hypertension: Secondary | ICD-10-CM

## 2021-08-06 DIAGNOSIS — E538 Deficiency of other specified B group vitamins: Secondary | ICD-10-CM

## 2021-08-06 DIAGNOSIS — E1169 Type 2 diabetes mellitus with other specified complication: Secondary | ICD-10-CM

## 2021-08-06 DIAGNOSIS — E559 Vitamin D deficiency, unspecified: Secondary | ICD-10-CM

## 2021-08-06 DIAGNOSIS — E892 Postprocedural hypoparathyroidism: Secondary | ICD-10-CM

## 2021-08-06 LAB — POCT GLYCOSYLATED HEMOGLOBIN (HGB A1C): Hemoglobin A1C: 4.9 % (ref 4.0–5.6)

## 2021-08-06 MED ORDER — OZEMPIC (1 MG/DOSE) 4 MG/3ML ~~LOC~~ SOPN: 1.0000 mg | PEN_INJECTOR | SUBCUTANEOUS | 1 refills | Status: DC

## 2021-08-06 MED ORDER — VITAMIN D (ERGOCALCIFEROL) 1.25 MG (50000 UNIT) PO CAPS: 50000.0000 [IU] | ORAL_CAPSULE | ORAL | 1 refills | Status: DC

## 2021-08-06 MED ORDER — CONTOUR NEXT TEST VI STRP: 1.0000 | ORAL_STRIP | Freq: Every day | 12 refills | Status: DC

## 2021-08-06 MED ORDER — DAPAGLIFLOZIN PROPANEDIOL 10 MG PO TABS: ORAL_TABLET | ORAL | 0 refills | Status: DC

## 2021-08-06 MED ORDER — ROSUVASTATIN CALCIUM 40 MG PO TABS: 40.0000 mg | ORAL_TABLET | Freq: Every day | ORAL | 1 refills | Status: DC

## 2021-08-06 MED ORDER — BENAZEPRIL HCL 10 MG PO TABS: 10.0000 mg | ORAL_TABLET | Freq: Every day | ORAL | 0 refills | Status: DC

## 2021-08-24 ENCOUNTER — Other Ambulatory Visit: Payer: Self-pay | Admitting: Family Medicine

## 2021-08-24 DIAGNOSIS — I10 Essential (primary) hypertension: Secondary | ICD-10-CM

## 2021-08-27 ENCOUNTER — Other Ambulatory Visit: Payer: Self-pay | Admitting: Family Medicine

## 2021-08-28 LAB — B12 AND FOLATE PANEL
Folate: 10.8 ng/mL (ref >3.0)
Vitamin B-12: 362 pg/mL (ref 232–1245)

## 2021-08-28 LAB — CALCIUM, IONIZED: Calcium, Ion: 5.1 mg/dL (ref 4.5–5.6)

## 2021-09-01 ENCOUNTER — Encounter: Payer: Self-pay | Admitting: Family Medicine

## 2021-09-02 NOTE — Progress Notes (Unsigned)
Name: Tabitha Shaw   MRN: [id number]    DOB: 1960/10/05   Date:09/03/2021       Progress Note  Subjective  Chief Complaint  Health Assessment   HPI  Obesity: she is still taking Ozempic, BMI is below 35, she is following a healthy diet and has been walking inside her house for for 30 minutes per day.   Reviewed labs and filled out form for her employer  B12 deficiency: she would like a B12 shot today, explained she can take SL b12 1000 mcg SL daily instead .   Hypothyroidism: she is taking Synthroid 68mg  daily but only half on Sundays since last checked. Denies diarrhea or palpitation, weight has been stable, she is due for repeat labs   Patient Active Problem List   Diagnosis Date Noted   [place]   S/P laparoscopic supracervical hysterectomy 04/23/2020   Dysplasia of cervix, low grade (CIN 1) 11/22/2019   ASCUS with positive high risk HPV cervical 10/24/2019   Personal history of colonic polyps    Polyp of colon    Cervical high risk HPV (human papillomavirus) test positive 09/03/2016   B12 deficiency 09/04/2015   Elevated PTHrP level 09/04/2015   Benign hypertension 09/12/2014   Acanthosis nigricans 09/09/2014   Diabetes mellitus with renal manifestation ([facility] 09/09/2014   Dyslipidemia due to type 2 diabetes mellitus ([facility] 09/09/2014   Abnormal electrocardiogram 09/09/2014   Enlarged kidney 09/09/2014   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[place] 09/09/2014   Enlarged liver 09/09/2014   Hypercalcemia 09/09/2014   Adult hypothyroidism 09/09/2014   Microalbuminuria 09/09/2014   Spinal stenosis of lumbar region with neurogenic claudication 09/09/2014   Osteopenia 09/09/2014   Vitamin D deficiency 09/09/2014    Past Surgical History:  Procedure Laterality Date   ABDOMINAL HYSTERECTOMY  2005   blood transfusion yearly     BREAST REDUCTION [place]  COLONOSCOPY  WITH PROPOFOL N/A 11/11/2018   Procedure: COLONOSCOPY WITH PROPOFOL;  Surgeon: TVirgel Manifold MD;  Location: [facility] ENDOSCOPY;  Service: Endoscopy;  Laterality: N/A;   COLONOSCOPY WITH PROPOFOL N/A 02/16/2020   Procedure: COLONOSCOPY WITH PROPOFOL;  Surgeon: TVirgel Manifold MD;  Location: [facility] ENDOSCOPY;  Service: Endoscopy;  Laterality: N/A;   lapcholecystectomy     PARATHYROIDECTOMY Right 11/26/2017   [place] [facility]      Family History  Problem Relation Age of Onset   Colon polyps Mother    Hemachromatosis Mother    Hypertension Mother    Depression Sister    Hypertension Brother    Colon polyps Brother    Cancer Father    Breast cancer Cousin    Breast cancer Cousin     Social History   Tobacco Use   Smoking status: Former    Packs/day: 0.25    Years: 8.00    Total pack years: 2.00    Types: Cigarettes    Start date: 01/19/1982    Quit date: 01/19/1990    Years since quitting: 31.6   Smokeless tobacco: Never  Substance Use Topics   Alcohol use: Yes    Alcohol/week: 0.0 standard drinks of alcohol    Comment: occasionally     Current Outpatient Medications:    ASPIRIN 81 PO, Take 81 mg by mouth daily as  needed., Disp: , Rfl:    benazepril (LOTENSIN) 10 MG tablet, TAKE 1 TABLET BY MOUTH DAILY, Disp: 90 tablet, Rfl: 0   Blood Glucose Monitoring Suppl (CONTOUR NEXT EZ) w/Device KIT, USE TO CHECK BLOOD SUGAR TWICE  DAILY AS DIRECTED, Disp: 1 kit, Rfl: 0   dapagliflozin propanediol (FARXIGA) 10 MG TABS tablet, TAKE 1 TABLET(10 MG) BY MOUTH DAILY BEFORE BREAKFAST, Disp: 90 tablet, Rfl: 0   glucose blood (CONTOUR NEXT TEST) test strip, 1 each by Other route daily before breakfast. Use as instructed, Disp: 100 each, Rfl: 12   levothyroxine (SYNTHROID) 75 MCG tablet, TAKE 1 TABLET(75 MCG) BY MOUTH DAILY, Disp: 30 tablet, Rfl: 0   rosuvastatin (CRESTOR) 40 MG tablet, Take 1  tablet (40 mg total) by mouth daily., Disp: 90 tablet, Rfl: 1   Semaglutide, 1 MG/DOSE, (OZEMPIC, 1 MG/DOSE,) 4 MG/3ML SOPN, Inject 1 mg into the skin once a week., Disp: 9 mL, Rfl: 1   Vitamin D, Ergocalciferol, (DRISDOL) 1.25 MG (50000 UNIT) CAPS capsule, Take 1 capsule (50,000 Units total) by mouth every 14 (fourteen) days., Disp: 6 capsule, Rfl: 1  Current Facility-Administered Medications:    cyanocobalamin (VITAMIN B12) injection 1,000 mcg, 1,000 mcg, Intramuscular, Once, Steele Sizer, MD  No Known Allergies  I personally reviewed active problem list, medication list, allergies, family history, social history, health maintenance with the patient/caregiver today.   ROS  Constitutional: Negative for fever or weight change.  Respiratory: Negative for cough and shortness of breath.   Cardiovascular: Negative for chest pain or palpitations.  Gastrointestinal: Negative for abdominal pain, no bowel changes.  Musculoskeletal: Negative for gait problem or joint swelling.  Skin: Negative for rash.  Neurological: Negative for dizziness or headache.  No other specific complaints in a complete review of systems (except as listed in HPI above).   Objective  Vitals:   09/03/21 1334  BP: 122/72  Pulse: 70  Resp: 16  Temp: 97.7 F (36.5 C)  TempSrc: Oral  SpO2: 98%  Weight: 221 lb 11.2 oz (100.6 kg)  Height: 5' 7" (1.702 m)    Body mass index is 34.72 kg/m.  Physical Exam  Constitutional: Patient appears well-developed and well-nourished. Obese  No distress.  HEENT: head atraumatic, normocephalic, pupils equal and reactive to light, neck supple Cardiovascular: Normal rate, regular rhythm and normal heart sounds.  No murmur heard. No BLE edema. Pulmonary/Chest: Effort normal and breath sounds normal. No respiratory distress. Abdominal: Soft.  There is no tenderness. Psychiatric: Patient has a normal mood and affect. behavior is normal. Judgment and thought content normal.    Recent Results (from the past 2160 hour(s))  CBC with Differential/Platelet     Status: Abnormal   Collection Time: 07/18/21  3:51 PM  Result Value Ref Range   WBC 10.3 3.4 - 10.8 x10E3/uL   RBC 3.94 3.77 - 5.28 x10E6/uL   Hemoglobin 14.5 11.1 - 15.9 g/dL   Hematocrit 40.9 34.0 - 46.6 %   MCV 104 (H) 79 - 97 fL   MCH 36.8 (H) 26.6 - 33.0 pg   MCHC 35.5 31.5 - 35.7 g/dL   RDW 12.1 11.7 - 15.4 %   Platelets 253 150 - 450 x10E3/uL   Neutrophils 46 Not Estab. %   Lymphs 46 Not Estab. %   Monocytes 6 Not Estab. %   Eos 1 Not Estab. %   Basos 1 Not Estab. %   Neutrophils Absolute 4.8 1.4 - 7.0 x10E3/uL   Lymphocytes Absolute 4.7 (H) 0.7 -  3.1 x10E3/uL   Monocytes Absolute 0.6 0.1 - 0.9 x10E3/uL   EOS (ABSOLUTE) 0.1 0.0 - 0.4 x10E3/uL   Basophils Absolute 0.1 0.0 - 0.2 x10E3/uL   Immature Granulocytes 0 Not Estab. %   Immature Grans (Abs) 0.0 0.0 - 0.1 x10E3/uL  Comprehensive metabolic panel     Status: None   Collection Time: 07/18/21  3:51 PM  Result Value Ref Range   Glucose 98 70 - 99 mg/dL   BUN 11 6 - 24 mg/dL   Creatinine, Ser 0.91 0.57 - 1.00 mg/dL   eGFR 73 >59 mL/min/1.73   BUN/Creatinine Ratio 12 9 - 23   Sodium 139 134 - 144 mmol/L   Potassium 4.2 3.5 - 5.2 mmol/L   Chloride 99 96 - 106 mmol/L   CO2 24 20 - 29 mmol/L   Calcium 10.1 8.7 - 10.2 mg/dL   Total Protein 7.4 6.0 - 8.5 g/dL   Albumin 4.8 3.8 - 4.9 g/dL    Comment:                **Effective July 28, 2021 Albumin reference interval**                  will be changing to:                             Age                  Female          Female                            0 -   7 days       3.6 - 4.9      3.6 - 4.9                            8 -  30 days       3.5 - 4.6      3.5 - 4.6                            1 -   6 months     3.7 - 4.8      3.7 - 4.8                     7 months -   2 years      4.0 - 5.0      4.0 - 5.0                            3 -   5 years      4.1 - 5.0      4.1 - 5.0                             6 -  12 years      4.2 - 5.0      4.2 - 5.0                           13 -  30 years      4.3 - 5.2  4.0 - 5.0                           31 -  50 years      4.1 - 5.1      3.9 - 4.9                           51 -  60 years      3.8 - 4.9      3.8 - 4.9                           61 -  70 years      3.9 - 4.9      3.9 - 4.9                           71 -  80 years      3.8 - 4.8      3.8 - 4._0 -  89 years      3.7 - 4.7      3.7 - 4.7                           90 - 199 years      3.6 - 4.6      3.6 - 4.6    Globulin, Total 2.6 1.5 - 4.5 g/dL   Albumin/Globulin Ratio 1.8 1.2 - 2.2   Bilirubin Total 0.8 0.0 - 1.2 mg/dL   Alkaline Phosphatase 58 44 - 121 IU/L   AST 21 0 - 40 IU/L   ALT 25 0 - 32 IU/L  TSH     Status: Abnormal   Collection Time: 07/18/21  3:52 PM  Result Value Ref Range   TSH 0.080 (L) 0.450 - 4.500 uIU/mL  Urine Microalbumin w/creat. ratio     Status: Abnormal   Collection Time: 07/18/21  3:52 PM  Result Value Ref Range   Creatinine, Urine 140.7 Not Estab. mg/dL   Microalbumin, Urine 81.2 Not Estab. ug/mL   Microalb/Creat Ratio 58 (H) 0 - 29 mg/g creat    Comment:                        Normal:                0 -  29                        Moderately increased: 30 - 300                        Severely increased:       >300   PTH, intact and calcium     Status: Abnormal   Collection Time: 07/18/21  3:52 PM  Result Value Ref Range   Calcium 10.3 (H) 8.7 - 10.2 mg/dL   PTH 35 15 - 65 pg/mL   PTH Interp Comment     Comment: Interpretation                 Intact PTH    Calcium                                 (  pg/mL)      (mg/dL) Normal                          15 - 65     8.6 - 10.2 Primary Hyperparathyroidism         >65          >10.2 Secondary Hyperparathyroidism       >65          <10.2 Non-Parathyroid Hypercalcemia       <65          >10.2 Hypoparathyroidism                  <15          < 8.6 Non-Parathyroid  Hypocalcemia    15 - 65          < 8.6   POCT HgB A1C     Status: None   Collection Time: 08/06/21  8:24 AM  Result Value Ref Range   Hemoglobin A1C 4.9 4.0 - 5.6 %   HbA1c POC (<> result, manual entry)     HbA1c, POC (prediabetic range)     HbA1c, POC (controlled diabetic range)    B12 and Folate Panel     Status: None   Collection Time: 08/27/21  7:54 AM  Result Value Ref Range   Vitamin B-12 362 232 - 1,245 pg/mL   Folate 10.8 >3.0 ng/mL    Comment: A serum folate concentration of less than 3.1 ng/mL is considered to represent clinical deficiency.   Calcium, ionized     Status: None   Collection Time: 08/27/21  7:54 AM  Result Value Ref Range   Calcium, Ion 5.1 4.5 - 5.6 mg/dL    PHQ2/9:    09/03/2021    1:36 PM 08/06/2021    8:21 AM 07/18/2021    2:34 PM 04/04/2021    8:52 AM 10/23/2020    1:15 PM  Depression screen PHQ 2/9  Decreased Interest 0 0 0 0 0  Down, Depressed, Hopeless 0 0 0 0 0  PHQ - 2 Score 0 0 0 0 0  Altered sleeping 0 0 0 0   Tired, decreased energy 0 0 0 0   Change in appetite 0 0 0 0   Feeling bad or failure about yourself  0 0 0 0   Trouble concentrating 0 0 0 0   Moving slowly or fidgety/restless 0 0 0 0   Suicidal thoughts 0 0 0 0   PHQ-9 Score 0 0 0 0   Difficult doing work/chores   Not difficult at all      phq 9 is negative   Fall Risk:    09/03/2021    1:36 PM 08/06/2021    8:20 AM 07/18/2021    2:34 PM 04/04/2021    8:52 AM 10/23/2020    1:15 PM  Fall Risk   Falls in the past year? 0 0 0 0 0  Number falls in past yr:  0 0 0 0  Injury with Fall?  0 0 0 0  Risk for fall due to : _0   Follow up Falls prevention discussed Falls prevention discussed Falls prevention discussed;Education provided Falls prevention discussed Falls prevention discussed      Functional Status Survey: Is the patient deaf or have difficulty hearing?: No Does the patient have difficulty seeing,  even when wearing glasses/contacts?: No Does the patient have difficulty concentrating, remembering, or making decisions?: No Does the patient have difficulty walking or climbing stairs?: No Does the patient have difficulty dressing or bathing?: No Does the patient have difficulty doing errands alone such as visiting a doctor's office or shopping?: No    Assessment & Plan  1. B12 deficiency  - cyanocobalamin (VITAMIN B12) injection 1,000 mcg  2. Obesity (BMI 30.0-34.9)  Discussed with the patient the risk posed by an increased BMI. Discussed importance of portion control, calorie counting and at least 150 minutes of physical activity weekly. Avoid sweet beverages and drink more water. Eat at least 6 servings of fruit and vegetables daily    3. Adult hypothyroidism   Recheck labs

## 2021-09-03 ENCOUNTER — Ambulatory Visit: Payer: Managed Care, Other (non HMO) | Admitting: Family Medicine

## 2021-09-03 ENCOUNTER — Encounter: Payer: Self-pay | Admitting: Family Medicine

## 2021-09-03 VITALS — BP 122/72 | HR 70 | Temp 97.7°F | Resp 16 | Ht 67.0 in | Wt 221.7 lb

## 2021-09-03 DIAGNOSIS — E039 Hypothyroidism, unspecified: Secondary | ICD-10-CM

## 2021-09-03 DIAGNOSIS — E538 Deficiency of other specified B group vitamins: Secondary | ICD-10-CM | POA: Diagnosis not present

## 2021-09-03 DIAGNOSIS — E669 Obesity, unspecified: Secondary | ICD-10-CM | POA: Diagnosis not present

## 2021-09-03 MED ORDER — CYANOCOBALAMIN 1000 MCG/ML IJ SOLN
1000.0000 ug | Freq: Once | INTRAMUSCULAR | Status: AC
  Administered 2021-09-03: 1000 ug via INTRAMUSCULAR

## 2021-09-15 ENCOUNTER — Other Ambulatory Visit: Payer: Self-pay | Admitting: Family Medicine

## 2021-09-15 DIAGNOSIS — I10 Essential (primary) hypertension: Secondary | ICD-10-CM

## 2021-09-15 NOTE — Telephone Encounter (Signed)
Prescription is written for 3 month supply already

## 2021-09-17 ENCOUNTER — Encounter: Payer: Self-pay | Admitting: Family Medicine

## 2021-10-02 ENCOUNTER — Ambulatory Visit: Payer: Managed Care, Other (non HMO) | Admitting: Dermatology

## 2021-10-15 ENCOUNTER — Encounter: Payer: Self-pay | Admitting: Oncology

## 2021-10-16 ENCOUNTER — Inpatient Hospital Stay: Payer: Managed Care, Other (non HMO) | Attending: Oncology

## 2021-10-16 ENCOUNTER — Encounter: Payer: Self-pay | Admitting: Oncology

## 2021-10-17 ENCOUNTER — Other Ambulatory Visit: Payer: Self-pay | Admitting: Family Medicine

## 2021-10-17 DIAGNOSIS — I10 Essential (primary) hypertension: Secondary | ICD-10-CM

## 2021-10-30 ENCOUNTER — Ambulatory Visit (INDEPENDENT_AMBULATORY_CARE_PROVIDER_SITE_OTHER): Payer: Managed Care, Other (non HMO)

## 2021-10-30 ENCOUNTER — Encounter: Payer: Self-pay | Admitting: Family Medicine

## 2021-10-30 ENCOUNTER — Other Ambulatory Visit: Payer: Self-pay | Admitting: Family Medicine

## 2021-10-30 VITALS — BP 134/89 | HR 71 | Resp 16 | Ht 67.0 in | Wt 223.6 lb

## 2021-10-30 DIAGNOSIS — N898 Other specified noninflammatory disorders of vagina: Secondary | ICD-10-CM | POA: Diagnosis not present

## 2021-10-30 DIAGNOSIS — E1129 Type 2 diabetes mellitus with other diabetic kidney complication: Secondary | ICD-10-CM

## 2021-10-30 DIAGNOSIS — R300 Dysuria: Secondary | ICD-10-CM

## 2021-10-30 DIAGNOSIS — E559 Vitamin D deficiency, unspecified: Secondary | ICD-10-CM

## 2021-10-30 LAB — POCT URINALYSIS DIPSTICK
Bilirubin, UA: NEGATIVE
Glucose, UA: POSITIVE — AB
Ketones, UA: NEGATIVE
Leukocytes, UA: NEGATIVE
Nitrite, UA: NEGATIVE
Protein, UA: POSITIVE — AB
Spec Grav, UA: 1.025 (ref 1.010–1.025)
Urobilinogen, UA: 0.2 E.U./dL
pH, UA: 6 (ref 5.0–8.0)

## 2021-10-30 NOTE — Progress Notes (Signed)
Patient presents today for a nurse visit. She complains of dysuria and vaginal itching x 1 week. She has been treating the vaginal itching with Monistat with some relief. Patient was advised to:  Increase water intake and add cranberry juice. Take extra strength Tylenol per label instructions or Ibuprofen 800 mg every 8 hours if you are not pregnant. AZO otc per label instructions. Start AZO after urine dip performed as it can contaminate specimen.

## 2021-11-01 LAB — URINE CULTURE

## 2021-11-02 ENCOUNTER — Other Ambulatory Visit: Payer: Self-pay | Admitting: Family Medicine

## 2021-11-02 DIAGNOSIS — E1169 Type 2 diabetes mellitus with other specified complication: Secondary | ICD-10-CM

## 2021-11-03 ENCOUNTER — Other Ambulatory Visit: Payer: Self-pay | Admitting: Family Medicine

## 2021-11-03 DIAGNOSIS — E039 Hypothyroidism, unspecified: Secondary | ICD-10-CM

## 2021-11-05 ENCOUNTER — Other Ambulatory Visit: Payer: Self-pay | Admitting: Advanced Practice Midwife

## 2021-11-05 DIAGNOSIS — B3731 Acute candidiasis of vulva and vagina: Secondary | ICD-10-CM

## 2021-11-05 LAB — NUSWAB VG, CANDIDA 6SP
C PARAPSILOSIS/TROPICALIS: NEGATIVE
Candida albicans, NAA: POSITIVE — AB
Candida glabrata, NAA: NEGATIVE
Candida krusei, NAA: NEGATIVE
Candida lusitaniae, NAA: NEGATIVE
Trich vag by NAA: NEGATIVE

## 2021-11-05 MED ORDER — FLUCONAZOLE 150 MG PO TABS: 150.0000 mg | ORAL_TABLET | Freq: Once | ORAL | 1 refills | Status: AC

## 2021-11-05 NOTE — Progress Notes (Signed)
Diflucan sent to [facility]/[address] in case monistat does not resolve yeast symptoms. Message sent to patient.

## 2021-11-17 ENCOUNTER — Other Ambulatory Visit: Payer: Self-pay | Admitting: Family Medicine

## 2021-11-17 DIAGNOSIS — Z1231 Encounter for screening mammogram for malignant neoplasm of breast: Secondary | ICD-10-CM

## 2021-11-29 LAB — TSH+T4F+T3FREE
Free T4: 1.44 ng/dL (ref 0.82–1.77)
T3, Free: 2.8 pg/mL (ref 2.0–4.4)
TSH: 0.223 u[IU]/mL — ABNORMAL LOW (ref 0.450–4.500)

## 2021-12-08 NOTE — Progress Notes (Unsigned)
Name: Tabitha Shaw   MRN: [id number]    DOB: 09/12/1960   Date:12/09/2021       Progress Note  Subjective  Chief Complaint  Follow Up  HPI  Obesity: she is still taking Ozempic, BMI is below 35, she is following a healthy diet and increased physical activity from walking to going to the gym 4 days a weeks with friends and they have a personal trainer  She is feeling well. Weight is down from 249 in Oct 22 to 222 lbs today   B12 deficiency: she has been taking SL B12 and we will recheck it before her next visit   Hypothyroidism: she is taking Synthroid 60mg  daily but only half on Sundays but last TSH still suppressed, with normal T3 and T4, we will try skipping Sunday dose and recheck in 6 weeks She has chronic dry skin, no change in bowel movements. She is compliant with her medication   DMII : hgbA1C spiked to 8.1% back in August 2020, A1C today is 5.8 % She is compliant  Ozempic and Farxiga for urine microalbuminuria that improved on her last check. Denies polyphagia, polydipsia or polyuria, but she has nocturia once per night - stable. LDL is at goal . She had one episode of yeast infection that was treated by gyn    HTN: she is down to 10 mg of Lotensin and bp is at goal, no dizziness, chest pain or palpitation   Hyperlipidemia: she is now on Crestor and LDL was at goal, continue medication and recheck labs prior to next visit   History of Primary  Hyperparathyroidism: seen by Dr. SGabriel Carinaand referred to Dr. KMaudie Mercury found to have a right lower parathyroid enlargement and had surgery on 11/26/2017, and has been released since, last Pth was normal   Hemochromatosis : sees Dr FGrayland Ormondand has phlebotomy . She has a follow up in Dec   Memory difficulties: she is worried because daughter noticed she has been forgetful, there is a family history of Alzheimer's dementia, and her 23&Me test was positive. She saw Dr. SManuella Ghazi she was diagnosed with mild cognitive dysfunction, she has been  compliant with CPAP. Dr. SManuella Ghaziadvised her to take a baby aspirin and no further follow ups required   Supra Sellar Mass: found on MRI brain, had it repeated 02/23   1. 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 Otherwise unremarkable brain MRI. 2. Essentially stable NeuroQuant volumetric analysis of the brain, see details on CBJ's   History of CIN1: she was seen by Dr. HKenton Kingfisher she had repeat pap smear April 2022 that was normal , she had a repeat pap smear done April 2023 that was normal but positive HPV   OSA:she has the CPAP machine now , but only able to tolerate a few hours most nights of the week, when she does not wear it she feels tired   Vitamin D: last vitamin D was towards high end normal, she is now taking vitamin D every other week. We will recheck labs     Patient Active Problem List   Diagnosis Date Noted   Obesity (BMI 30.0-34.9) 09/03/2021   History of parathyroidectomy ([place] 08/06/2021   S/P laparoscopic supracervical hysterectomy 04/23/2020   Dysplasia of cervix, low grade (CIN 1) 11/22/2019   ASCUS with positive high risk HPV cervical 10/24/2019   Personal history of colonic polyps    Polyp of colon    Cervical high  risk HPV (human papillomavirus) test positive 09/03/2016   B12 deficiency 09/04/2015   Elevated PTHrP level 09/04/2015   Benign hypertension 09/12/2014   Acanthosis nigricans 09/09/2014   Diabetes mellitus with renal manifestation ([place]) 09/09/2014   Dyslipidemia due to type 2 diabetes mellitus ([place]) 09/09/2014   Abnormal electrocardiogram 09/09/2014   Enlarged kidney 09/09/2014   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[place]) 09/09/2014   Enlarged liver 09/09/2014   Hypercalcemia 09/09/2014   Adult hypothyroidism 09/09/2014   Microalbuminuria 09/09/2014   Spinal stenosis of lumbar region with neurogenic claudication 09/09/2014   Osteopenia 09/09/2014   Vitamin D deficiency 09/09/2014    Past Surgical  History:  Procedure Laterality Date   ABDOMINAL HYSTERECTOMY  2005   blood transfusion yearly     BREAST REDUCTION [place]   COLONOSCOPY WITH PROPOFOL N/A 11/11/2018   Procedure: COLONOSCOPY WITH PROPOFOL;  Surgeon: Virgel Manifold, MD;  Location: [facility] ENDOSCOPY;  Service: Endoscopy;  Laterality: N/A;   COLONOSCOPY WITH PROPOFOL N/A 02/16/2020   Procedure: COLONOSCOPY WITH PROPOFOL;  Surgeon: Virgel Manifold, MD;  Location: [facility] ENDOSCOPY;  Service: Endoscopy;  Laterality: N/A;   lapcholecystectomy     PARATHYROIDECTOMY Right 11/26/2017   right lower , Dr. Maudie Mercury at [facility] W/ [facility]      Family History  Problem Relation Age of Onset   Colon polyps Mother    Hemachromatosis Mother    Hypertension Mother    Depression Sister    Hypertension Brother    Colon polyps Brother    Cancer Father    Breast cancer Cousin    Breast cancer Cousin     Social History   Tobacco Use   Smoking status: Former    Packs/day: 0.25    Years: 8.00    Total pack years: 2.00    Types: Cigarettes    Start date: 01/19/1982    Quit date: 01/19/1990    Years since quitting: 31.9   Smokeless tobacco: Never  Substance Use Topics   Alcohol use: Yes    Alcohol/week: 0.0 standard drinks of alcohol    Comment: occasionally     Current Outpatient Medications:    ASPIRIN 81 PO, Take 81 mg by mouth daily as needed., Disp: , Rfl:    benazepril (LOTENSIN) 10 MG tablet, TAKE 1 TABLET BY MOUTH DAILY, Disp: 90 tablet, Rfl: 0   Blood Glucose Monitoring Suppl (CONTOUR NEXT EZ) w/Device KIT, USE TO CHECK BLOOD SUGAR TWICE  DAILY AS DIRECTED, Disp: 1 kit, Rfl: 0   dapagliflozin propanediol (FARXIGA) 10 MG TABS tablet, TAKE 1 TABLET(10 MG) BY MOUTH DAILY BEFORE BREAKFAST, Disp: 90 tablet, Rfl: 0   glucose blood (CONTOUR NEXT TEST) test strip, 1 each by Other route daily  before breakfast. Use as instructed, Disp: 100 each, Rfl: 12   levothyroxine (SYNTHROID) 75 MCG tablet, TAKE 1 TABLET(75 MCG) BY MOUTH DAILY, Disp: 30 tablet, Rfl: 0   OZEMPIC, 1 MG/DOSE, 4 MG/3ML SOPN, INJECT 1 MG UNDER THE SKIN ONCE A WEEK, Disp: 9 mL, Rfl: 1   rosuvastatin (CRESTOR) 40 MG tablet, TAKE 1 TABLET BY MOUTH DAILY, Disp: 90 tablet, Rfl: 1   Vitamin D, Ergocalciferol, (DRISDOL) 1.25 MG (50000 UNIT) CAPS capsule, TAKE 1 CAPSULE BY MOUTH EVERY 7 DAYS, Disp: 12 capsule, Rfl: 1  No Known Allergies  I personally reviewed active problem list, medication  list, allergies, family history, social history, health maintenance with the patient/caregiver today.   ROS  Constitutional: Negative for fever, positive for  weight change.  Respiratory: Negative for cough and shortness of breath.   Cardiovascular: Negative for chest pain or palpitations.  Gastrointestinal: Negative for abdominal pain, no bowel changes.  Musculoskeletal: Negative for gait problem or joint swelling.  Skin: Negative for rash.  Neurological: Negative for dizziness or headache.  No other specific complaints in a complete review of systems (except as listed in HPI above).   Objective  Vitals:   12/09/21 0749  BP: 128/76  Pulse: 88  Resp: 16  SpO2: 95%  Weight: 222 lb (100.7 kg)  Height: _0  (1.702 m)    Body mass index is 34.77 kg/m.  Physical Exam  Constitutional: Patient appears well-developed and well-nourished.  No distress.  HEENT: head atraumatic, normocephalic, pupils equal and reactive to light, neck supple Cardiovascular: Normal rate, regular rhythm and normal heart sounds.  No murmur heard. No BLE edema. Pulmonary/Chest: Effort normal and breath sounds normal. No respiratory distress. Abdominal: Soft.  There is no tenderness. Psychiatric: Patient has a normal mood and affect. behavior is normal. Judgment and thought content normal.   Recent Results (from the past 2160 hour(s))  POCT  Urinalysis Dipstick     Status: Abnormal   Collection Time: 10/30/21  8:30 AM  Result Value Ref Range   Color, UA     Clarity, UA     Glucose, UA Positive (A) Negative   Bilirubin, UA Negative    Ketones, UA Negative    Spec Grav, UA 1.025 1.010 - 1.025   Blood, UA Small    pH, UA 6.0 5.0 - 8.0   Protein, UA Positive (A) Negative   Urobilinogen, UA 0.2 0.2 or 1.0 E.U./dL   Nitrite, UA Negative    Leukocytes, UA Negative Negative   Appearance     Odor    Urine Culture     Status: None   Collection Time: 10/30/21  9:13 AM   Specimen: Urine   UR  Result Value Ref Range   Urine Culture, Routine Final report    Organism ID, Bacteria Comment     Comment: Mixed urogenital flora 10,000-25,000 colony forming units per mL   NuSwab VG, Candida 6sp     Status: Abnormal   Collection Time: 10/30/21  9:35 AM  Result Value Ref Range   Atopobium vaginae Low - 0 Score   BVAB 2 Low - 0 Score   Megasphaera 1 Low - 0 Score    Comment: Calculate total score by adding the 3 individual bacterial vaginosis (BV) marker scores together.  Total score is interpreted as follows: Total score 0-1: Indicates the absence of BV. Total score   2: Indeterminate for BV. Additional clinical                  data should be evaluated to establish a                  diagnosis. Total score 3-6: Indicates the presence of BV. This test was developed and its performance characteristics determined by [facility].  It has not been cleared or approved by the Food and Drug Administration.    Candida albicans, NAA Positive (A) Negative   Candida glabrata, NAA Negative Negative   C PARAPSILOSIS/TROPICALIS Negative Negative    Comment: This assay does not differentiate C. tropicalis and C. parapsilosis.   Candida lusitaniae, NAA Negative Negative  Candida krusei, NAA Negative Negative   Trich vag by NAA Negative Negative  TSH+T4F+T3Free     Status: Abnormal   Collection Time: 11/28/21  4:01 PM  Result Value Ref Range    TSH 0.223 (L) 0.450 - 4.500 uIU/mL   T3, Free 2.8 2.0 - 4.4 pg/mL   Free T4 1.44 0.82 - 1.77 ng/dL  POCT HgB A1C     Status: Abnormal   Collection Time: 12/09/21  7:50 AM  Result Value Ref Range   Hemoglobin A1C 5.8 (A) 4.0 - 5.6 %   HbA1c POC (<> result, manual entry)     HbA1c, POC (prediabetic range)     HbA1c, POC (controlled diabetic range)      PHQ2/9:    12/09/2021    7:48 AM 09/03/2021    1:36 PM 08/06/2021    8:21 AM 07/18/2021    2:34 PM 04/04/2021    8:52 AM  Depression screen PHQ 2/9  Decreased Interest 0 0 0 0 0  Down, Depressed, Hopeless 0 0 0 0 0  PHQ - 2 Score 0 0 0 0 0  Altered sleeping 0 0 0 0 0  Tired, decreased energy 1 0 0 0 0  Change in appetite 0 0 0 0 0  Feeling bad or failure about yourself  0 0 0 0 0  Trouble concentrating 0 0 0 0 0  Moving slowly or fidgety/restless 0 0 0 0 0  Suicidal thoughts 0 0 0 0 0  PHQ-9 Score 1 0 0 0 0  Difficult doing work/chores    Not difficult at all     phq 9 is negative   Fall Risk:    12/09/2021    7:48 AM 09/03/2021    1:36 PM 08/06/2021    8:20 AM 07/18/2021    2:34 PM 04/04/2021    8:52 AM  Fall Risk   Falls in the past year? 0 0 0 0 0  Number falls in past yr: 0  0 0 0  Injury with Fall? 0  0 0 0  Risk for fall due to : _0   Follow up Falls prevention discussed Falls prevention discussed Falls prevention discussed Falls prevention discussed;Education provided Falls prevention discussed      Functional Status Survey: Is the patient deaf or have difficulty hearing?: No Does the patient have difficulty seeing, even when wearing glasses/contacts?: No Does the patient have difficulty concentrating, remembering, or making decisions?: No Does the patient have difficulty walking or climbing stairs?: No Does the patient have difficulty dressing or bathing?: No Does the patient have difficulty doing errands alone such as visiting a doctor's  office or shopping?: No    Assessment & Plan  1. Type 2 diabetes mellitus with microalbuminuria, with long-term current use of insulin (HCC)  - POCT HgB A1C - Microalbumin / creatinine urine ratio - dapagliflozin propanediol (FARXIGA) 10 MG TABS tablet; TAKE 1 TABLET(10 MG) BY MOUTH DAILY BEFORE BREAKFAST  Dispense: 90 tablet; Refill: 1  2. 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[place])  Keep follow up with hematologist   3. Dyslipidemia due to type 2 diabetes mellitus ([place])  - Lipid panel  4. History of parathyroidectomy ([place])   5. Vitamin D deficiency  - VITAMIN D 25 Hydroxy (Vit-D Deficiency, Fractures)  6. B12 deficiency  - CBC with Differential/Platelet - Vitamin B12  7. Adult hypothyroidism  - TSH + free T4 - levothyroxine (SYNTHROID) 75  MCG tablet; Take 1 tablet (75 mcg total) by mouth daily before breakfast. Skip Sundays  Dispense: 90 tablet; Refill: 0  8. Benign hypertension  - Comprehensive metabolic panel - benazepril (LOTENSIN) 10 MG tablet; Take 1 tablet (10 mg total) by mouth daily.  Dispense: 90 tablet; Refill: 1  9. Obesity (BMI 30.0-34.9)  Discussed with the patient the risk posed by an increased BMI. Discussed importance of portion control, calorie counting and at least 150 minutes of physical activity weekly. Avoid sweet beverages and drink more water. Eat at least 6 servings of fruit and vegetables daily    10. Yeast vaginitis  - fluconazole (DIFLUCAN) 150 MG tablet; Take 1 tablet (150 mg total) by mouth every other day.  Dispense: 3 tablet; Refill: 1

## 2021-12-09 ENCOUNTER — Ambulatory Visit: Payer: Managed Care, Other (non HMO) | Admitting: Family Medicine

## 2021-12-09 ENCOUNTER — Encounter: Payer: Self-pay | Admitting: Family Medicine

## 2021-12-09 VITALS — BP 128/76 | HR 88 | Resp 16 | Ht 67.0 in | Wt 222.0 lb

## 2021-12-09 DIAGNOSIS — R809 Proteinuria, unspecified: Secondary | ICD-10-CM | POA: Diagnosis not present

## 2021-12-09 DIAGNOSIS — E1169 Type 2 diabetes mellitus with other specified complication: Secondary | ICD-10-CM

## 2021-12-09 DIAGNOSIS — E892 Postprocedural hypoparathyroidism: Secondary | ICD-10-CM

## 2021-12-09 DIAGNOSIS — E1129 Type 2 diabetes mellitus with other diabetic kidney complication: Secondary | ICD-10-CM

## 2021-12-09 DIAGNOSIS — E669 Obesity, unspecified: Secondary | ICD-10-CM

## 2021-12-09 DIAGNOSIS — B3731 Acute candidiasis of vulva and vagina: Secondary | ICD-10-CM

## 2021-12-09 DIAGNOSIS — Z794 Long term (current) use of insulin: Secondary | ICD-10-CM

## 2021-12-09 DIAGNOSIS — I10 Essential (primary) hypertension: Secondary | ICD-10-CM

## 2021-12-09 DIAGNOSIS — E538 Deficiency of other specified B group vitamins: Secondary | ICD-10-CM

## 2021-12-09 DIAGNOSIS — E785 Hyperlipidemia, unspecified: Secondary | ICD-10-CM

## 2021-12-09 DIAGNOSIS — E559 Vitamin D deficiency, unspecified: Secondary | ICD-10-CM

## 2021-12-09 DIAGNOSIS — E039 Hypothyroidism, unspecified: Secondary | ICD-10-CM

## 2021-12-09 LAB — POCT GLYCOSYLATED HEMOGLOBIN (HGB A1C): Hemoglobin A1C: 5.8 % — AB (ref 4.0–5.6)

## 2021-12-09 MED ORDER — LEVOTHYROXINE SODIUM 75 MCG PO TABS: 75.0000 ug | ORAL_TABLET | Freq: Every day | ORAL | 0 refills | Status: DC

## 2021-12-09 MED ORDER — BENAZEPRIL HCL 10 MG PO TABS: 10.0000 mg | ORAL_TABLET | Freq: Every day | ORAL | 1 refills | Status: DC

## 2021-12-09 MED ORDER — DAPAGLIFLOZIN PROPANEDIOL 10 MG PO TABS: ORAL_TABLET | ORAL | 1 refills | Status: DC

## 2021-12-09 MED ORDER — FLUCONAZOLE 150 MG PO TABS: 150.0000 mg | ORAL_TABLET | ORAL | 1 refills | Status: DC

## 2021-12-20 ENCOUNTER — Other Ambulatory Visit: Payer: Self-pay | Admitting: Family Medicine

## 2021-12-20 DIAGNOSIS — I10 Essential (primary) hypertension: Secondary | ICD-10-CM

## 2021-12-22 NOTE — Telephone Encounter (Signed)
Requested Prescriptions  Pending Prescriptions Disp Refills   benazepril (LOTENSIN) 10 MG tablet [Pharmacy Med Name: BENAZEPRIL '10MG'$  TABLETS] 90 tablet 1    Sig: TAKE 1 TABLET BY MOUTH DAILY     Cardiovascular:  ACE Inhibitors Passed - 12/20/2021  3:35 AM      Passed - Cr in normal range and within 180 days    Creatinine, Ser  Date Value Ref Range Status  07/18/2021 0.91 0.57 - 1.00 mg/dL Final         Passed - K in normal range and within 180 days    Potassium  Date Value Ref Range Status  07/18/2021 4.2 3.5 - 5.2 mmol/L Final         Passed - Patient is not pregnant      Passed - Last BP in normal range    BP Readings from Last 1 Encounters:  12/09/21 128/76         Passed - Valid encounter within last 6 months    Recent Outpatient Visits           1 week ago Type 2 diabetes mellitus with microalbuminuria, with long-term current use of insulin [facility])   [facility] Steele Sizer, MD   3 months ago B12 deficiency   [facility] [place], Drue Stager, MD   4 months ago Type 2 diabetes mellitus with microalbuminuria, with long-term current use of insulin [facility])   [facility] [place], Drue Stager, MD   5 months ago Hypotension, unspecified hypotension type   [place], PA-C   8 months ago Type 2 diabetes mellitus with microalbuminuria, with long-term current use of insulin [facility])   [facility] Steele Sizer, MD       Future Appointments             In 2 weeks Steele Sizer, MD [facility], [place]   In 2 months Steele Sizer, MD [facility], [facility]

## 2021-12-23 ENCOUNTER — Encounter: Payer: Self-pay | Admitting: Family Medicine

## 2021-12-26 ENCOUNTER — Ambulatory Visit
Admission: RE | Admit: 2021-12-26 | Discharge: 2021-12-26 | Disposition: A | Discharge status: Home / Self Care | Payer: Managed Care, Other (non HMO) | Source: Ambulatory Visit | Attending: Family Medicine | Admitting: Family Medicine

## 2021-12-26 DIAGNOSIS — Z1231 Encounter for screening mammogram for malignant neoplasm of breast: Secondary | ICD-10-CM | POA: Diagnosis present

## 2021-12-29 ENCOUNTER — Telehealth: Payer: Self-pay | Admitting: Oncology

## 2021-12-29 NOTE — Telephone Encounter (Signed)
Pt called and rescheduled her appt to the end of FEB. She stated she needs a new [facility] lab requisition mailed to her  so she can get her labs done prior.

## 2022-01-05 ENCOUNTER — Ambulatory Visit: Payer: Managed Care, Other (non HMO) | Admitting: Family Medicine

## 2022-01-05 ENCOUNTER — Encounter: Payer: Managed Care, Other (non HMO) | Admitting: Family Medicine

## 2022-01-06 ENCOUNTER — Ambulatory Visit: Payer: Managed Care, Other (non HMO) | Admitting: Nurse Practitioner

## 2022-01-06 ENCOUNTER — Ambulatory Visit: Payer: Managed Care, Other (non HMO) | Admitting: Oncology

## 2022-01-22 ENCOUNTER — Telehealth: Payer: Managed Care, Other (non HMO) | Admitting: Family Medicine

## 2022-01-22 ENCOUNTER — Encounter: Payer: Self-pay | Admitting: Family Medicine

## 2022-01-22 VITALS — Ht 67.0 in | Wt 222.0 lb

## 2022-01-22 DIAGNOSIS — B349 Viral infection, unspecified: Secondary | ICD-10-CM

## 2022-01-22 DIAGNOSIS — Z20822 Contact with and (suspected) exposure to covid-19: Secondary | ICD-10-CM

## 2022-01-22 DIAGNOSIS — R040 Epistaxis: Secondary | ICD-10-CM

## 2022-01-22 NOTE — Patient Instructions (Signed)
Nosebleed, Adult A nosebleed is when blood comes out of the nose. Nosebleeds are common. Usually, they are not a sign of a serious condition. Nosebleeds can happen if a blood vessel in your nose starts to bleed or if the lining of your nose (mucous membrane) cracks. They are commonly caused by: Allergies. Colds. Picking your nose. Blowing your nose too hard. An injury from sticking an object into your nose or getting hit in the nose. Dry or cold air. Less common causes of nosebleeds include: Toxic fumes. Something abnormal in the nose or in the air-filled spaces in the bones of the face (sinuses). Growths in the nose, such as polyps. Blood thinners or conditions that cause blood to clot slowly. Certain illnesses or procedures that irritate or dry out the nasal passages. Follow these instructions at home: When you have a nosebleed:  Sit down and tilt your head slightly forward. Use a clean towel or tissue to pinch your nostrils under the bony part of your nose. After 5 minutes, let go of your nose and see if bleeding starts again. Do not release pressure before that time. If there is still bleeding, repeat the pinching and holding for 5 minutes or until the bleeding stops. Do not place tissues or gauze in the nose to stop the bleeding. Avoid lying down and avoid tilting your head backward. That may make blood collect in the throat and cause gagging or coughing. Use a nasal spray decongestant to help with a nosebleed as told by your health care provider. After a nosebleed: Avoid blowing your nose or sniffing for a number of hours. Avoid straining, lifting, or bending at the waist for several days. You may go back to other normal activities as you are able. If you are taking aspirin or blood thinners and you have nosebleeds, talk to your health care provider. These medicines make bleeding more likely. Ask your health care provider if you should stop taking the medicines or if you should  adjust the dose. Do not stop taking medicines that your health care provider has recommended unless he or she tells you to stop taking them. If your nosebleed was caused by dry mucous membranes, use over-the-counter saline nasal spray or gel and a humidifier as told by your health care provider. This will keep the mucous membranes moist and allow them to heal. If you need to use one of these products: Choose one that is water-soluble. Use only as much as you need and use it only as often as needed. Do not lie down right after you use it. If you get nosebleeds often, talk with your health care provider about medical treatments. Options may include: Nasal cautery. This treatment stops and prevents nosebleeds by using a chemical swab or electrical device to lightly burn tiny blood vessels inside the nose. Nasal packing. A gauze or other material is placed in the nose to keep constant pressure on the bleeding area. Contact a health care provider if you: Have a fever. Get nosebleeds often or more often than usual. Bruise very easily. Have a nosebleed from having something stuck in your nose. Have bleeding in your mouth. Vomit or cough up brown material. Have a nosebleed after you start a new medicine. Get help right away if: You have a nosebleed after a fall or a head injury. Your nosebleed does not go away after 20 minutes. You feel dizzy or weak. You have unusual bleeding from other parts of your body. You have unusual bruising on  other parts of your body. You become sweaty. You vomit blood. Summary A nosebleed is when blood comes out of the nose. Common causes include allergies, an injury to the nose, or cold or dry air. Initial treatment includes applying pressure for 5 minutes. Moisturizing the nose with saline nasal spray or gel after a nosebleed may help prevent future bleeding. Get help right away if your nosebleed does not go away after 20 minutes. This information is not intended  to replace advice given to you by your health care provider. Make sure you discuss any questions you have with your health care provider. Document Revised: 11/03/2018 Document Reviewed: 11/03/2018 Elsevier Patient Education  2022 [employer].

## 2022-01-22 NOTE — Progress Notes (Signed)
Name: Tabitha Shaw   MRN: [id number]    DOB: 1960-07-22   Date:01/22/2022       Progress Note  Subjective  Chief Complaint  Chief Complaint  Patient presents with   URI    X4 days    I connected with  Tabitha Shaw  on 01/22/22 at 11:00 AM EST by a video enabled telemedicine application and verified that I am speaking with the correct person using two identifiers.  I discussed the limitations of evaluation and management by telemedicine and the availability of in person appointments. The patient expressed understanding and agreed to proceed with a virtual visit  Staff also discussed with the patient that there may be a patient responsible charge related to this service. Patient Location: At home Provider Location: [facility] Additional Individuals present: alone   HPI  Viral illness: her daughter was positive for COVID-19 on Saturday am, patient was with her the day before and spent the weekend at her house. On Monday she developed nasal congestion, chills, and fever of 101 once that resolved with tylenol, she also has  decrease in appetite. No nausea, vomiting. She is taking otc medications . The worse problem is that she had two episodes of nose bleeds. Once on Monday and once this am that lasted 15 minutes, it had one clot , not gushing blood. She is feeling better now. Working remotely   Patient Active Problem List   Diagnosis Date Noted   Obesity (BMI 30.0-34.9) 09/03/2021   History of parathyroidectomy ([place]) 08/06/2021   S/P laparoscopic supracervical hysterectomy 04/23/2020   Dysplasia of cervix, low grade (CIN 1) 11/22/2019   ASCUS with positive high risk HPV cervical 10/24/2019   Personal history of colonic polyps    Polyp of colon    Cervical high risk HPV (human papillomavirus) test positive 09/03/2016   B12 deficiency 09/04/2015   Elevated PTHrP level 09/04/2015   Benign hypertension 09/12/2014   Acanthosis nigricans 09/09/2014   Diabetes mellitus with renal  manifestation ([place]) 09/09/2014   Dyslipidemia due to type 2 diabetes mellitus ([place]) 09/09/2014   Abnormal electrocardiogram 09/09/2014   Enlarged kidney 09/09/2014   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[place]) 09/09/2014   Enlarged liver 09/09/2014   Hypercalcemia 09/09/2014   Adult hypothyroidism 09/09/2014   Microalbuminuria 09/09/2014   Spinal stenosis of lumbar region with neurogenic claudication 09/09/2014   Osteopenia 09/09/2014   Vitamin D deficiency 09/09/2014    Social History   Tobacco Use   Smoking status: Former    Packs/day: 0.25    Years: 8.00    Total pack years: 2.00    Types: Cigarettes    Start date: 01/19/1982    Quit date: 01/19/1990    Years since quitting: 32.0   Smokeless tobacco: Never  Substance Use Topics   Alcohol use: Yes    Alcohol/week: 0.0 standard drinks of alcohol    Comment: occasionally     Current Outpatient Medications:    ASPIRIN 81 PO, Take 81 mg by mouth daily as needed., Disp: , Rfl:    benazepril (LOTENSIN) 10 MG tablet, Take 1 tablet (10 mg total) by mouth daily., Disp: 90 tablet, Rfl: 1   Blood Glucose Monitoring Suppl (CONTOUR NEXT EZ) w/Device KIT, USE TO CHECK BLOOD SUGAR TWICE  DAILY AS DIRECTED, Disp: 1 kit, Rfl: 0   dapagliflozin propanediol (FARXIGA) 10 MG TABS tablet, TAKE 1 TABLET(10 MG) BY MOUTH DAILY BEFORE BREAKFAST, Disp: 90 tablet, Rfl: 1   fluconazole (DIFLUCAN) 150 MG tablet,  Take 1 tablet (150 mg total) by mouth every other day., Disp: 3 tablet, Rfl: 1   glucose blood (CONTOUR NEXT TEST) test strip, 1 each by Other route daily before breakfast. Use as instructed, Disp: 100 each, Rfl: 12   levothyroxine (SYNTHROID) 75 MCG tablet, Take 1 tablet (75 mcg total) by mouth daily before breakfast. Skip Sundays, Disp: 90 tablet, Rfl: 0   OZEMPIC, 1 MG/DOSE, 4 MG/3ML SOPN, INJECT 1 MG UNDER THE SKIN ONCE A WEEK, Disp: 9 mL, Rfl: 1   rosuvastatin (CRESTOR) 40 MG tablet, TAKE 1 TABLET BY MOUTH DAILY, Disp: 90 tablet, Rfl: 1   Vitamin D,  Ergocalciferol, (DRISDOL) 1.25 MG (50000 UNIT) CAPS capsule, TAKE 1 CAPSULE BY MOUTH EVERY 7 DAYS, Disp: 12 capsule, Rfl: 1  No Known Allergies  I personally reviewed active problem list, medication list, allergies with the patient/caregiver today.  ROS  Ten systems reviewed and is negative except as mentioned in HPI    Objective  Virtual encounter, vitals not obtained.  Body mass index is 34.77 kg/m.  Nursing Note and Vital Signs reviewed.  Physical Exam  Awake, alert and oriented   Assessment & Plan  1. Viral illness  Advised to do another home COVID test, she likely has covid due to recent exposure, stay hydrated, try to eat, rest. Avoid exposing other people . She states she will work remotely for the rest of this week   2. Bleeding from the nose   Discussed how to stop nose bleeds, also placing information on my chart    -Red flags and when to present for emergency care or RTC including fever >101.22F, chest pain, shortness of breath, new/worsening/un-resolving symptoms, 15 reviewed with patient at time of visit. Follow up and care instructions discussed and provided in AVS. - I discussed the assessment and treatment plan with the patient. The patient was provided an opportunity to ask questions and all were answered. The patient agreed with the plan and demonstrated an understanding of the instructions.  I provided 15 minutes of non-face-to-face time during this encounter.  Loistine Chance, MD

## 2022-02-20 LAB — COMPREHENSIVE METABOLIC PANEL
ALT: 19 IU/L (ref 0–32)
AST: 20 IU/L (ref 0–40)
Albumin/Globulin Ratio: 1.7 (ref 1.2–2.2)
Albumin: 4.7 g/dL (ref 3.8–4.9)
Alkaline Phosphatase: 71 IU/L (ref 44–121)
BUN/Creatinine Ratio: 14 (ref 9–23)
BUN: 11 mg/dL (ref 6–24)
Bilirubin Total: 0.7 mg/dL (ref 0.0–1.2)
CO2: 22 mmol/L (ref 20–29)
Calcium: 9.6 mg/dL (ref 8.7–10.2)
Chloride: 104 mmol/L (ref 96–106)
Creatinine, Ser: 0.76 mg/dL (ref 0.57–1.00)
Globulin, Total: 2.8 g/dL (ref 1.5–4.5)
Glucose: 105 mg/dL — ABNORMAL HIGH (ref 70–99)
Potassium: 4.3 mmol/L (ref 3.5–5.2)
Sodium: 142 mmol/L (ref 134–144)
Total Protein: 7.5 g/dL (ref 6.0–8.5)
eGFR: 90 mL/min/{1.73_m2} (ref >59)

## 2022-02-20 LAB — VITAMIN B12: Vitamin B-12: 419 pg/mL (ref 232–1245)

## 2022-02-20 LAB — CBC WITH DIFFERENTIAL/PLATELET
Basophils Absolute: 0.1 10*3/uL (ref 0.0–0.2)
Basos: 1 %
EOS (ABSOLUTE): 0.1 10*3/uL (ref 0.0–0.4)
Eos: 1 %
Hematocrit: 48.2 % — ABNORMAL HIGH (ref 34.0–46.6)
Hemoglobin: 17.2 g/dL — ABNORMAL HIGH (ref 11.1–15.9)
Immature Grans (Abs): 0 10*3/uL (ref 0.0–0.1)
Immature Granulocytes: 0 %
Lymphocytes Absolute: 3.4 10*3/uL — ABNORMAL HIGH (ref 0.7–3.1)
Lymphs: 44 %
MCH: 37.3 pg — ABNORMAL HIGH (ref 26.6–33.0)
MCHC: 35.7 g/dL (ref 31.5–35.7)
MCV: 105 fL — ABNORMAL HIGH (ref 79–97)
Monocytes Absolute: 0.5 10*3/uL (ref 0.1–0.9)
Monocytes: 7 %
Neutrophils Absolute: 3.8 10*3/uL (ref 1.4–7.0)
Neutrophils: 47 %
Platelets: 206 10*3/uL (ref 150–450)
RBC: 4.61 x10E6/uL (ref 3.77–5.28)
RDW: 12.1 % (ref 11.7–15.4)
WBC: 7.9 10*3/uL (ref 3.4–10.8)

## 2022-02-20 LAB — LIPID PANEL
Chol/HDL Ratio: 3 ratio (ref 0.0–4.4)
Cholesterol, Total: 139 mg/dL (ref 100–199)
HDL: 46 mg/dL (ref >39)
LDL Chol Calc (NIH): 71 mg/dL (ref 0–99)
Triglycerides: 126 mg/dL (ref 0–149)
VLDL Cholesterol Cal: 22 mg/dL (ref 5–40)

## 2022-02-20 LAB — TSH+FREE T4
Free T4: 1.08 ng/dL (ref 0.82–1.77)
TSH: 0.985 u[IU]/mL (ref 0.450–4.500)

## 2022-02-20 LAB — MICROALBUMIN / CREATININE URINE RATIO
Creatinine, Urine: 75.6 mg/dL
Microalb/Creat Ratio: 67 mg/g creat — ABNORMAL HIGH (ref 0–29)
Microalbumin, Urine: 50.3 ug/mL

## 2022-02-20 LAB — VITAMIN D 25 HYDROXY (VIT D DEFICIENCY, FRACTURES): Vit D, 25-Hydroxy: 72 ng/mL (ref 30.0–100.0)

## 2022-02-23 ENCOUNTER — Ambulatory Visit (INDEPENDENT_AMBULATORY_CARE_PROVIDER_SITE_OTHER): Payer: Managed Care, Other (non HMO) | Admitting: Family Medicine

## 2022-02-23 ENCOUNTER — Encounter: Payer: Self-pay | Admitting: Family Medicine

## 2022-02-23 ENCOUNTER — Ambulatory Visit: Payer: Managed Care, Other (non HMO) | Admitting: Family Medicine

## 2022-02-23 VITALS — BP 120/76 | HR 93 | Resp 16 | Ht 67.0 in | Wt 223.0 lb

## 2022-02-23 DIAGNOSIS — Z124 Encounter for screening for malignant neoplasm of cervix: Secondary | ICD-10-CM

## 2022-02-23 DIAGNOSIS — M8588 Other specified disorders of bone density and structure, other site: Secondary | ICD-10-CM

## 2022-02-23 DIAGNOSIS — R87810 Cervical high risk human papillomavirus (HPV) DNA test positive: Secondary | ICD-10-CM | POA: Diagnosis not present

## 2022-02-23 DIAGNOSIS — Z0000 Encounter for general adult medical examination without abnormal findings: Secondary | ICD-10-CM | POA: Diagnosis not present

## 2022-02-23 NOTE — Patient Instructions (Signed)
Preventive Care 40-60 Years Old, Female Preventive care refers to lifestyle choices and visits with your health care provider that can promote health and wellness. Preventive care visits are also called wellness exams. What can I expect for my preventive care visit? Counseling Your health care provider may ask you questions about your: Medical history, including: Past medical problems. Family medical history. Pregnancy history. Current health, including: Menstrual cycle. Method of birth control. Emotional well-being. Home life and relationship well-being. Sexual activity and sexual health. Lifestyle, including: Alcohol, nicotine or tobacco, and drug use. Access to firearms. Diet, exercise, and sleep habits. Work and work environment. Sunscreen use. Safety issues such as seatbelt and bike helmet use. Physical exam Your health care provider will check your: Height and weight. These may be used to calculate your BMI (body mass index). BMI is a measurement that tells if you are at a healthy weight. Waist circumference. This measures the distance around your waistline. This measurement also tells if you are at a healthy weight and may help predict your risk of certain diseases, such as type 2 diabetes and high blood pressure. Heart rate and blood pressure. Body temperature. Skin for abnormal spots. What immunizations do I need?  Vaccines are usually given at various ages, according to a schedule. Your health care provider will recommend vaccines for you based on your age, medical history, and lifestyle or other factors, such as travel or where you work. What tests do I need? Screening Your health care provider may recommend screening tests for certain conditions. This may include: Lipid and cholesterol levels. Diabetes screening. This is done by checking your blood sugar (glucose) after you have not eaten for a while (fasting). Pelvic exam and Pap test. Hepatitis B test. Hepatitis C  test. HIV (human immunodeficiency virus) test. STI (sexually transmitted infection) testing, if you are at risk. Lung cancer screening. Colorectal cancer screening. Mammogram. Talk with your health care provider about when you should start having regular mammograms. This may depend on whether you have a family history of breast cancer. BRCA-related cancer screening. This may be done if you have a family history of breast, ovarian, tubal, or peritoneal cancers. Bone density scan. This is done to screen for osteoporosis. Talk with your health care provider about your test results, treatment options, and if necessary, the need for more tests. Follow these instructions at home: Eating and drinking  Eat a diet that includes fresh fruits and vegetables, whole grains, lean protein, and low-fat dairy products. Take vitamin and mineral supplements as recommended by your health care provider. Do not drink alcohol if: Your health care provider tells you not to drink. You are pregnant, may be pregnant, or are planning to become pregnant. If you drink alcohol: Limit how much you have to 0-1 drink a day. Know how much alcohol is in your drink. In the [place] one drink equals one 12 oz bottle of beer (355 mL), one 5 oz glass of wine (148 mL), or one 1 oz glass of hard liquor (44 mL). Lifestyle Brush your teeth every morning and night with fluoride toothpaste. Floss one time each day. Exercise for at least 30 minutes 5 or more days each week. Do not use any products that contain nicotine or tobacco. These products include cigarettes, chewing tobacco, and vaping devices, such as e-cigarettes. If you need help quitting, ask your health care provider. Do not use drugs. If you are sexually active, practice safe sex. Use a condom or other form of protection to   prevent STIs. If you do not wish to become pregnant, use a form of birth control. If you plan to become pregnant, see your health care provider for a  prepregnancy visit. Take aspirin only as told by your health care provider. Make sure that you understand how much to take and what form to take. Work with your health care provider to find out whether it is safe and beneficial for you to take aspirin daily. Find healthy ways to manage stress, such as: Meditation, yoga, or listening to music. Journaling. Talking to a trusted person. Spending time with friends and family. Minimize exposure to UV radiation to reduce your risk of skin cancer. Safety Always wear your seat belt while driving or riding in a vehicle. Do not drive: If you have been drinking alcohol. Do not ride with someone who has been drinking. When you are tired or distracted. While texting. If you have been using any mind-altering substances or drugs. Wear a helmet and other protective equipment during sports activities. If you have firearms in your house, make sure you follow all gun safety procedures. Seek help if you have been physically or sexually abused. What's next? Visit your health care provider once a year for an annual wellness visit. Ask your health care provider how often you should have your eyes and teeth checked. Stay up to date on all vaccines. This information is not intended to replace advice given to you by your health care provider. Make sure you discuss any questions you have with your health care provider. Document Revised: 07/03/2020 Document Reviewed: 07/03/2020 [employer] Patient Education  2023 [employer]  

## 2022-02-23 NOTE — Progress Notes (Signed)
Name: Tabitha Shaw   MRN: [id number]    DOB: 1960/07/11   Date:02/23/2022       Progress Note  Subjective  Chief Complaint  Annual Exam  HPI  Patient presents for annual CPE.  Diet: she cooks at home, trying to eat more fruit, eats vegetables every dinner.  Exercise: going to the gym 4 times a week - for about one hour   Last Eye Exam: up to date Last Dental Exam: she is due for follow up  [place] Visit from 10/23/2020 in [facility]  AUDIT-C Score 1      Depression: Phq 9 is  negative    02/23/2022    7:57 AM 01/22/2022   11:20 AM 12/09/2021    7:48 AM 09/03/2021    1:36 PM 08/06/2021    8:21 AM  Depression screen PHQ 2/9  Decreased Interest 0 0 0 0 0  Down, Depressed, Hopeless 0 0 0 0 0  PHQ - 2 Score 0 0 0 0 0  Altered sleeping 1 0 0 0 0  Tired, decreased energy 1 0 1 0 0  Change in appetite 0 0 0 0 0  Feeling bad or failure about yourself  0 0 0 0 0  Trouble concentrating 0 0 0 0 0  Moving slowly or fidgety/restless 0 0 0 0 0  Suicidal thoughts 0 0 0 0 0  PHQ-9 Score 2 0 1 0 0   Hypertension: BP Readings from Last 3 Encounters:  02/23/22 120/76  12/09/21 128/76  10/30/21 134/89   Obesity: Wt Readings from Last 3 Encounters:  02/23/22 223 lb (101.2 kg)  01/22/22 222 lb (100.7 kg)  12/09/21 222 lb (100.7 kg)   BMI Readings from Last 3 Encounters:  02/23/22 34.93 kg/m  01/22/22 34.77 kg/m  12/09/21 34.77 kg/m     Vaccines:    Tdap: up to date Shingrix: up to date Pneumonia: up to date Flu: up to date COVID-13: discussed booster    Hep C Screening: 09/05/18 STD testing and prevention (HIV/chl/gon/syphilis): 09/23/18 Intimate partner violence: negative screen  Sexual History : not sexually active, husband had prostatectomy and unable to have an erection  Menstrual History/LMP/Abnormal Bleeding: hysterectomy  Discussed importance of follow up if any post-menopausal bleeding: N/A Incontinence Symptoms:  negative for symptoms   Breast cancer:  - Last Mammogram: 12/26/21 - BRCA gene screening: N/A  Osteoporosis Prevention : Discussed high calcium and vitamin D supplementation, weight bearing exercises Bone density: 02/08/18, ordered today   Cervical cancer screening: 04/22/21, due- ordered today  Skin cancer: Discussed monitoring for atypical lesions  Colorectal cancer: 02/16/20   Lung cancer:  Low Dose CT Chest recommended if Age 60-80 years, 20 pack-year currently smoking OR have quit w/in 15years. Patient does not qualify for screen   ECG: 05/29/09  Advanced Care Planning: A voluntary discussion about advance care planning including the explanation and discussion of advance directives.  Discussed health care proxy and Living will, and the patient was able to identify a health care proxy as husband and daughter.   Patient does not have a living will and power of attorney of health care   Lipids: Lab Results  Component Value Date   CHOL 139 02/18/2022   CHOL 129 10/21/2020   CHOL 135 08/26/2020   Lab Results  Component Value Date   HDL 46 02/18/2022   HDL 41 10/21/2020   HDL 42 05/12/2019   Lab Results  Component Value  Date   LDLCALC 71 02/18/2022   LDLCALC 64 10/21/2020   LDLCALC 76 05/12/2019   Lab Results  Component Value Date   TRIG 126 02/18/2022   TRIG 139 10/21/2020   TRIG 197 (A) 08/26/2020   Lab Results  Component Value Date   CHOLHDL 3.0 02/18/2022   CHOLHDL 3.1 10/21/2020   CHOLHDL 3.3 05/12/2019   No results found for: "LDLDIRECT"  Glucose: Glucose  Date Value Ref Range Status  02/18/2022 105 (H) 70 - 99 mg/dL Final  07/18/2021 98 70 - 99 mg/dL Final  04/02/2021 120 (H) 70 - 99 mg/dL Final   Glucose-Capillary  Date Value Ref Range Status  02/16/2020 119 (H) 70 - 99 mg/dL Final    Comment:    Glucose reference range applies only to samples taken after fasting for at least 8 hours.  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[place]) 08/06/2021   S/P laparoscopic supracervical hysterectomy 04/23/2020   Dysplasia of cervix, low grade (CIN 1) 11/22/2019   ASCUS with positive high risk HPV cervical 10/24/2019   Personal history of colonic polyps    Polyp of colon    Cervical high risk HPV (human papillomavirus) test positive 09/03/2016   B12 deficiency 09/04/2015   Elevated PTHrP level 09/04/2015   Benign hypertension 09/12/2014   Acanthosis nigricans 09/09/2014   Diabetes mellitus with renal manifestation ([place]) 09/09/2014   Dyslipidemia due to type 2 diabetes mellitus ([place]) 09/09/2014   Abnormal electrocardiogram 09/09/2014   Enlarged kidney 09/09/2014   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[place]) 09/09/2014   Enlarged liver 09/09/2014   Hypercalcemia 09/09/2014   Adult hypothyroidism 09/09/2014   Microalbuminuria 09/09/2014   Spinal stenosis of lumbar region with neurogenic claudication 09/09/2014   Osteopenia 09/09/2014   Vitamin D deficiency 09/09/2014    Past Surgical History:  Procedure Laterality Date   ABDOMINAL HYSTERECTOMY  2005   blood transfusion yearly     BREAST REDUCTION [address]   COLONOSCOPY WITH PROPOFOL N/A 11/11/2018   Procedure: COLONOSCOPY WITH PROPOFOL;  Surgeon: Virgel Manifold, MD;  Location: [facility] ENDOSCOPY;  Service: Endoscopy;  Laterality: N/A;   COLONOSCOPY WITH PROPOFOL N/A 02/16/2020   Procedure: COLONOSCOPY WITH PROPOFOL;  Surgeon: Virgel Manifold, MD;  Location: [facility] ENDOSCOPY;  Service: Endoscopy;  Laterality: N/A;   lapcholecystectomy     PARATHYROIDECTOMY Right 11/26/2017   right lower , Dr. Maudie Mercury at [facility] W/ [facility]      Family History  Problem Relation Age of Onset   Colon polyps Mother    Hemachromatosis Mother    Hypertension Mother     Depression Sister    Hypertension Brother    Colon polyps Brother    Cancer Father    Breast cancer Cousin    Breast cancer Cousin     Social History   Socioeconomic History   Marital status: Married    Spouse name: John    Number of children: 1   Years of education: Not on file   Highest education level: Some college, no degree  Occupational History   Occupation: customer services     Comment: [employer]   Tobacco Use   Smoking status: Former    Packs/day: 0.25  Years: 8.00    Total pack years: 2.00    Types: Cigarettes    Start date: 01/19/1982    Quit date: 01/19/1990    Years since quitting: 32.1   Smokeless tobacco: Never  Vaping Use   Vaping Use: Never used  Substance and Sexual Activity   Alcohol use: Yes    Alcohol/week: 0.0 standard drinks of alcohol    Comment: occasionally   Drug use: No   Sexual activity: Yes    Partners: Male    Birth control/protection: Surgical  Other Topics Concern   Not on file  Social History Narrative   Married; works  full time   Her daughter, [name] is a travel Marine scientist    Social Determinants of Radio broadcast assistant Strain: [place]  (02/23/2022)   Overall Financial Resource Strain (CARDIA)    Difficulty of Paying Living Expenses: Not hard at all  Food Insecurity: No Food Insecurity (02/23/2022)   Hunger Vital Sign    Worried About Running Out of Food in the Last Year: Never true    [place] in the Last Year: Never true  Transportation Needs: No Transportation Needs (02/23/2022)   PRAPARE - Hydrologist (Medical): No    Lack of Transportation (Non-Medical): No  Physical Activity: Sufficiently Active (02/23/2022)   Exercise Vital Sign    Days of Exercise per Week: 4 days    Minutes of Exercise per Session: 60 min  Stress: No Stress Concern Present (02/23/2022)   [place]    Feeling of Stress : Not at all  Social  Connections: [place] (02/23/2022)   Social Connection and Isolation Panel [NHANES]    Frequency of Communication with Friends and Family: Three times a week    Frequency of Social Gatherings with Friends and Family: Once a week    Attends Religious Services: More than 4 times per year    Active Member of [employer] or Organizations: Yes    Attends Archivist Meetings: 1 to 4 times per year    Marital Status: Married  Human resources officer Violence: Not At Risk (02/23/2022)   Humiliation, Afraid, Rape, and Kick questionnaire    Fear of Current or Ex-Partner: No    Emotionally Abused: No    Physically Abused: No    Sexually Abused: No     Current Outpatient Medications:    ASPIRIN 81 PO, Take 81 mg by mouth daily as needed., Disp: , Rfl:    benazepril (LOTENSIN) 10 MG tablet, Take 1 tablet (10 mg total) by mouth daily., Disp: 90 tablet, Rfl: 1   Blood Glucose Monitoring Suppl (CONTOUR NEXT EZ) w/Device KIT, USE TO CHECK BLOOD SUGAR TWICE  DAILY AS DIRECTED, Disp: 1 kit, Rfl: 0   dapagliflozin propanediol (FARXIGA) 10 MG TABS tablet, TAKE 1 TABLET(10 MG) BY MOUTH DAILY BEFORE BREAKFAST, Disp: 90 tablet, Rfl: 1   fluconazole (DIFLUCAN) 150 MG tablet, Take 1 tablet (150 mg total) by mouth every other day., Disp: 3 tablet, Rfl: 1   glucose blood (CONTOUR NEXT TEST) test strip, 1 each by Other route daily before breakfast. Use as instructed, Disp: 100 each, Rfl: 12   levothyroxine (SYNTHROID) 75 MCG tablet, Take 1 tablet (75 mcg total) by mouth daily before breakfast. Skip Sundays, Disp: 90 tablet, Rfl: 0   OZEMPIC, 1 MG/DOSE, 4 MG/3ML SOPN, INJECT 1 MG UNDER THE SKIN ONCE A WEEK, Disp: 9 mL, Rfl:  1   rosuvastatin (CRESTOR) 40 MG tablet, TAKE 1 TABLET BY MOUTH DAILY, Disp: 90 tablet, Rfl: 1   Vitamin D, Ergocalciferol, (DRISDOL) 1.25 MG (50000 UNIT) CAPS capsule, TAKE 1 CAPSULE BY MOUTH EVERY 7 DAYS, Disp: 12 capsule, Rfl: 1  No Known Allergies   ROS  Constitutional: Negative for  fever or weight change.  Respiratory: Negative for cough and shortness of breath.   Cardiovascular: Negative for chest pain or palpitations.  Gastrointestinal: Negative for abdominal pain, no bowel changes.  Musculoskeletal: Negative for gait problem or joint swelling.  Skin: Negative for rash.  Neurological: Negative for dizziness or headache.  No other specific complaints in a complete review of systems (except as listed in HPI above).   Objective  Vitals:   02/23/22 0800  BP: 120/76  Pulse: 93  Resp: 16  SpO2: 95%  Weight: 223 lb (101.2 kg)  Height: '5\' 7"'$  (1.702 m)    Body mass index is 34.93 kg/m.  Physical Exam  Constitutional: Patient appears well-developed and well-nourished. No distress.  HENT: Head: Normocephalic and atraumatic. Ears: B TMs ok, no erythema or effusion; Nose: Nose normal. Mouth/Throat: Oropharynx is clear and moist. No oropharyngeal exudate.  Eyes: Conjunctivae and EOM are normal. Pupils are equal, round, and reactive to light. No scleral icterus.  Neck: Normal range of motion. Neck supple. No JVD present. No thyromegaly present.  Cardiovascular: Normal rate, regular rhythm and normal heart sounds.  No murmur heard. No BLE edema. Pulmonary/Chest: Effort normal and breath sounds normal. No respiratory distress. Abdominal: Soft. Bowel sounds are normal, no distension. There is no tenderness. no masses Breast: no lumps or masses, no nipple discharge or rashes FEMALE GENITALIA:  External genitalia normal External urethra normal Vaginal vault normal without discharge or lesions Cervix normal without discharge or lesions, hysterectomy - did a swab but cervix looked flat  Bimanual exam normal without masses RECTAL: anal skin tag Musculoskeletal: Normal range of motion, no joint effusions. No gross deformities Neurological: he is alert and oriented to person, place, and time. No cranial nerve deficit. Coordination, balance, strength, speech and gait are  normal.  Skin: Skin is warm and dry. No rash noted. No erythema.  Psychiatric: Patient has a normal mood and affect. behavior is normal. Judgment and thought content normal.   Recent Results (from the past 2160 hour(s))  TSH+T4F+T3Free     Status: Abnormal   Collection Time: 11/28/21  4:01 PM  Result Value Ref Range   TSH 0.223 (L) 0.450 - 4.500 uIU/mL   T3, Free 2.8 2.0 - 4.4 pg/mL   Free T4 1.44 0.82 - 1.77 ng/dL  POCT HgB A1C     Status: Abnormal   Collection Time: 12/09/21  7:50 AM  Result Value Ref Range   Hemoglobin A1C 5.8 (A) 4.0 - 5.6 %   HbA1c POC (<> result, manual entry)     HbA1c, POC (prediabetic range)     HbA1c, POC (controlled diabetic range)    Microalbumin / creatinine urine ratio     Status: Abnormal   Collection Time: 02/18/22  9:21 AM  Result Value Ref Range   Creatinine, Urine 75.6 Not Estab. mg/dL   Microalbumin, Urine 50.3 Not Estab. ug/mL   Microalb/Creat Ratio 67 (H) 0 - 29 mg/g creat    Comment:                        Normal:  0 -  29                        Moderately increased: 30 - 300                        Severely increased:       >300   Lipid panel     Status: None   Collection Time: 02/18/22  9:21 AM  Result Value Ref Range   Cholesterol, Total 139 100 - 199 mg/dL   Triglycerides 126 0 - 149 mg/dL   HDL 46 >39 mg/dL   VLDL Cholesterol Cal 22 5 - 40 mg/dL   LDL Chol Calc (NIH) 71 0 - 99 mg/dL   Chol/HDL Ratio 3.0 0.0 - 4.4 ratio    Comment:                                   T. Chol/HDL Ratio                                             Men  Women                               1/2 Avg.Risk  3.4    3.3                                   Avg.Risk  5.0    4.4                                2X Avg.Risk  9.6    7.1                                3X Avg.Risk 23.4   11.0   CBC with Differential/Platelet     Status: Abnormal   Collection Time: 02/18/22  9:21 AM  Result Value Ref Range   WBC 7.9 3.4 - 10.8 x10E3/uL   RBC 4.61 3.77 -  5.28 x10E6/uL   Hemoglobin 17.2 (H) 11.1 - 15.9 g/dL   Hematocrit 48.2 (H) 34.0 - 46.6 %   MCV 105 (H) 79 - 97 fL   MCH 37.3 (H) 26.6 - 33.0 pg   MCHC 35.7 31.5 - 35.7 g/dL   RDW 12.1 11.7 - 15.4 %   Platelets 206 150 - 450 x10E3/uL   Neutrophils 47 Not Estab. %   Lymphs 44 Not Estab. %   Monocytes 7 Not Estab. %   Eos 1 Not Estab. %   Basos 1 Not Estab. %   Neutrophils Absolute 3.8 1.4 - 7.0 x10E3/uL   Lymphocytes Absolute 3.4 (H) 0.7 - 3.1 x10E3/uL   Monocytes Absolute 0.5 0.1 - 0.9 x10E3/uL   EOS (ABSOLUTE) 0.1 0.0 - 0.4 x10E3/uL   Basophils Absolute 0.1 0.0 - 0.2 x10E3/uL   Immature Granulocytes 0 Not Estab. %   Immature Grans (Abs) 0.0 0.0 - 0.1 x10E3/uL  Comprehensive metabolic panel     Status: Abnormal   Collection Time: 02/18/22  9:21  AM  Result Value Ref Range   Glucose 105 (H) 70 - 99 mg/dL   BUN 11 6 - 24 mg/dL   Creatinine, Ser 0.76 0.57 - 1.00 mg/dL   eGFR 90 >59 mL/min/1.73   BUN/Creatinine Ratio 14 9 - 23   Sodium 142 134 - 144 mmol/L   Potassium 4.3 3.5 - 5.2 mmol/L   Chloride 104 96 - 106 mmol/L   CO2 22 20 - 29 mmol/L   Calcium 9.6 8.7 - 10.2 mg/dL   Total Protein 7.5 6.0 - 8.5 g/dL   Albumin 4.7 3.8 - 4.9 g/dL   Globulin, Total 2.8 1.5 - 4.5 g/dL   Albumin/Globulin Ratio 1.7 1.2 - 2.2   Bilirubin Total 0.7 0.0 - 1.2 mg/dL   Alkaline Phosphatase 71 44 - 121 IU/L   AST 20 0 - 40 IU/L   ALT 19 0 - 32 IU/L  VITAMIN D 25 Hydroxy (Vit-D Deficiency, Fractures)     Status: None   Collection Time: 02/18/22  9:21 AM  Result Value Ref Range   Vit D, 25-Hydroxy 72.0 30.0 - 100.0 ng/mL    Comment: Vitamin D deficiency has been defined by the Institute of Medicine and an Endocrine Society practice guideline as a level of serum 25-OH vitamin D less than 20 ng/mL (1,2). The Endocrine Society went on to further define vitamin D insufficiency as a level between 21 and 29 ng/mL (2). 1. IOM (Institute of Medicine). 2010. Dietary reference    intakes for calcium and  D. [place]: The    [employer]. 2. Holick MF, Binkley Hawley, Bischoff-Ferrari HA, et al.    Evaluation, treatment, and prevention of vitamin D    deficiency: an Endocrine Society clinical practice    guideline. JCEM. 2011 Jul; 96(7):1911-30.   Vitamin B12     Status: None   Collection Time: 02/18/22  9:21 AM  Result Value Ref Range   Vitamin B-12 419 232 - 1,245 pg/mL  TSH + free T4     Status: None   Collection Time: 02/18/22  9:21 AM  Result Value Ref Range   TSH 0.985 0.450 - 4.500 uIU/mL   Free T4 1.08 0.82 - 1.77 ng/dL     Fall Risk:    02/23/2022    7:57 AM 01/22/2022   11:20 AM 12/09/2021    7:48 AM 09/03/2021    1:36 PM 08/06/2021    8:20 AM  Fall Risk   Falls in the past year? 0 0 0 0 0  Number falls in past yr: 0  0  0  Injury with Fall? 0  0  0  Risk for fall due to : No Fall Risks No Fall Risks No Fall Risks No Fall Risks No Fall Risks  Follow up Falls prevention discussed Falls prevention discussed;Education provided;Falls evaluation completed Falls prevention discussed Falls prevention discussed Falls prevention discussed     Functional Status Survey: Is the patient deaf or have difficulty hearing?: No Does the patient have difficulty seeing, even when wearing glasses/contacts?: No Does the patient have difficulty concentrating, remembering, or making decisions?: No Does the patient have difficulty walking or climbing stairs?: No Does the patient have difficulty dressing or bathing?: No Does the patient have difficulty doing errands alone such as visiting a doctor's office or shopping?: No   Assessment & Plan  1. Well adult exam   2. Cervical cancer screening  - Pap LB (liquid-based) HPV positive  3. Osteopenia of lumbar spine  -  DG Bone Density; Future    -USPSTF grade A and B recommendations reviewed with patient; age-appropriate recommendations, preventive care, screening tests, etc discussed and encouraged; healthy living  encouraged; see AVS for patient education given to patient -Discussed importance of 150 minutes of physical activity weekly, eat two servings of fish weekly, eat one serving of tree nuts ( cashews, pistachios, pecans, almonds.Marland Kitchen) every other day, eat 6 servings of fruit/vegetables daily and drink plenty of water and avoid sweet beverages.   -Reviewed Health Maintenance: Yes.

## 2022-02-23 NOTE — Progress Notes (Deleted)
Name: Tabitha Shaw   MRN: [id number]    DOB: [birth year]   Date:02/23/2022       Progress Note  Subjective  Chief Complaint  Follow Up  HPI  Obesity: she is still taking Ozempic, BMI is below 35, she is following a healthy diet and increased physical activity from walking to going to the gym 4 days a weeks with friends and they have a personal trainer  She is feeling well. Weight is down from 249 in Oct 22 to 222 lbs today   B12 deficiency: she has been taking SL B12 and we will recheck it before her next visit   Hypothyroidism: she is taking Synthroid 74mg  daily but only half on Sundays but last TSH still suppressed, with normal T3 and T4, we will try skipping Sunday dose and recheck in 6 weeks She has chronic dry skin, no change in bowel movements. She is compliant with her medication   DMII : hgbA1C spiked to 8.1% back in August 2020, A1C today is 5.8 % She is compliant  Ozempic and Farxiga for urine microalbuminuria that improved on her last check. Denies polyphagia, polydipsia or polyuria, but she has nocturia once per night - stable. LDL is at goal . She had one episode of yeast infection that was treated by gyn    HTN: she is down to 10 mg of Lotensin and bp is at goal, no dizziness, chest pain or palpitation   Hyperlipidemia: she is now on Crestor and LDL was at goal, continue medication and recheck labs prior to next visit   History of Primary  Hyperparathyroidism: seen by Dr. SGabriel Carinaand referred to Dr. KMaudie Mercury found to have a right lower parathyroid enlargement and had surgery on 11/26/2017, and has been released since, last Pth was normal   Hemochromatosis : sees Dr FGrayland Ormondand has phlebotomy . She has a follow up in Dec   Memory difficulties: she is worried because daughter noticed she has been forgetful, there is a family history of Alzheimer's dementia, and her 23&Me test was positive. She saw Dr. SManuella Ghazi she was diagnosed with mild cognitive dysfunction, she has been  compliant with CPAP. Dr. SManuella Ghaziadvised her to take a baby aspirin and no further follow ups required   Supra Sellar Mass: found on MRI brain, had it repeated 02/23   1. 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 Otherwise unremarkable brain MRI. 2. Essentially stable NeuroQuant volumetric analysis of the brain, see details on [facility]   History of CIN1: she was seen by Dr. HKenton Kingfisher she had repeat pap smear April 2022 that was normal , she had a repeat pap smear done April 2023 that was normal but positive HPV   OSA:she has the CPAP machine now , but only able to tolerate a few hours most nights of the week, when she does not wear it she feels tired   Vitamin D: last vitamin D was towards high end normal, she is now taking vitamin D every other week. We will recheck labs   Patient Active Problem List   Diagnosis Date Noted   Obesity (BMI 30.0-34.9) 09/03/2021   [place] of [place]roidectomy ([place] 08/06/2021   S/P laparoscopic supracervical hysterectomy 04/23/2020   Dysplasia of cervix, low grade (CIN 1) 11/22/2019   ASCUS with positive high risk HPV cervical 10/24/2019   Personal history of colonic polyps    Polyp of colon    Cervical high risk HPV (  human papillomavirus) test positive 09/03/2016   B12 deficiency 09/04/2015   Elevated PTHrP level 09/04/2015   Benign hypertension 09/12/2014   Acanthosis nigricans 09/09/2014   Diabetes mellitus with renal manifestation ([place]) 09/09/2014   Dyslipidemia due to type 2 diabetes mellitus ([place]) 09/09/2014   Abnormal electrocardiogram 09/09/2014   Enlarged kidney 09/09/2014   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[place]) 09/09/2014   Enlarged liver 09/09/2014   Hypercalcemia 09/09/2014   Adult hypothyroidism 09/09/2014   Microalbuminuria 09/09/2014   Spinal stenosis of lumbar region with neurogenic claudication 09/09/2014   Osteopenia 09/09/2014   Vitamin D deficiency 09/09/2014    Past Surgical History:   Procedure Laterality Date   ABDOMINAL HYSTERECTOMY  2005   blood transfusion yearly     BREAST REDUCTION [place]   COLONOSCOPY WITH PROPOFOL N/A 11/11/2018   Procedure: COLONOSCOPY WITH PROPOFOL;  Surgeon: Virgel Manifold, MD;  Location: [facility] END[facility]COPY;  Service: Endoscopy;  Laterality: N/A;   COLONOSCOPY WITH PROPOFOL N/A 02/16/2020   Procedure: COLONOSCOPY WITH PROPOFOL;  Surgeon: Virgel Manifold, MD;  Location: [facility] ENDOSCOPY;  Service: Endoscopy;  Laterality: N/A;   lapcholecystectomy     PARATHYROIDECTOMY Right 11/26/2017   right lower , Dr. Maudie Mercury at [facility]/ [facility]      Family History  Problem Relation Age of Onset   Colon polyps Mother    Hemachromatosis Mother    Hypertension Mother    Depression Sister    Hypertension Brother    Colon polyps Brother    Cancer Father    Breast cancer Cousin    Breast cancer Cousin     Social History   Tobacco Use   Smoking status: Former    Packs/day: 0.25    Years: 8.00    Total pack years: 2.00    Types: Cigarettes    Start date: 01/19/1982    Quit date: 01/19/1990    Years since quitting: 32.1   Smokeless tobacco: Never  Substance Use Topics   Alcohol use: Yes    Alcohol/week: 0.0 standard drinks of alcohol    Comment: occasionally     Current Outpatient Medications:    ASPIRIN 81 PO, Take 81 mg by mouth daily as needed., Disp: , Rfl:    benazepril (LOTENSIN) 10 MG tablet, Take 1 tablet (10 mg total) by mouth daily., Disp: 90 tablet, Rfl: 1   Blood Glucose Monitoring Suppl (CONTOUR NEXT EZ) w/Device KIT, USE TO CHECK BLOOD SUGAR TWICE  DAILY AS DIRECTED, Disp: 1 kit, Rfl: 0   dapagliflozin propanediol (FARXIGA) 10 MG TABS tablet, TAKE 1 TABLET(10 MG) BY MOUTH DAILY BEFORE BREAKFAST, Disp: 90 tablet, Rfl: 1   fluconazole (DIFLUCAN) 150 MG tablet, Take 1 tablet (150 mg total) by  mouth every other day., Disp: 3 tablet, Rfl: 1   glucose blood (CONTOUR NEXT TEST) test strip, 1 each by Other route daily before breakfast. Use as instructed, Disp: 100 each, Rfl: 12   levothyroxine (SYNTHROID) 75 MCG tablet, Take 1 tablet (75 mcg total) by mouth daily before breakfast. Skip Sundays, Disp: 90 tablet, Rfl: 0   OZEMPIC, 1 MG/DOSE, 4 MG/3ML SOPN, INJECT 1 MG UNDER THE SKIN ONCE A WEEK, Disp: 9 mL, Rfl: 1   rosuvastatin (CRESTOR) 40 MG tablet, TAKE 1 TABLET BY MOUTH DAILY, Disp: 90 tablet, Rfl: 1   Vitamin D, Ergocalciferol, (DRISDOL) 1.25  MG (50000 UNIT) CAPS capsule, TAKE 1 CAPSULE BY MOUTH EVERY 7 DAYS, Disp: 12 capsule, Rfl: 1  No Known Allergies  I personally reviewed active problem list, medication list, allergies, family history, social history, health maintenance with the patient/caregiver today.   ROS  ***  Objective  There were no vitals filed for this visit.  There is no height or weight on file to calculate BMI.  Physical Exam ***   PHQ2/9:    01/22/2022   11:20 AM 12/09/2021    7:48 AM 09/03/2021    1:36 PM 08/06/2021    8:21 AM 07/18/2021    2:34 PM  Depression screen PHQ 2/9  Decreased Interest 0 0 0 0 0  Down, Depressed, Hopeless 0 0 0 0 0  PHQ - 2 Score 0 0 0 0 0  Altered sleeping 0 0 0 0 0  Tired, decreased energy 0 1 0 0 0  Change in appetite 0 0 0 0 0  Feeling bad or failure about yourself  0 0 0 0 0  Trouble concentrating 0 0 0 0 0  Moving slowly or fidgety/restless 0 0 0 0 0  Suicidal thoughts 0 0 0 0 0  PHQ-9 Score 0 1 0 0 0  Difficult doing work/chores     Not difficult at all    phq 9 is {gen pos [id number]   Fall Risk:    01/22/2022   11:20 AM 12/09/2021    7:48 AM 09/03/2021    1:36 PM 08/06/2021    8:20 AM 07/18/2021    2:34 PM  Fall Risk   Falls in the past year? 0 0 0 0 0  Number falls in past yr:  0  0 0  Injury with Fall?  0  0 0  Risk for fall due to : No Fall Risks No Fall Risks No Fall Risks No Fall Risks No Fall  Risks  Follow up Falls prevention discussed;Education provided;Falls evaluation completed Falls prevention discussed Falls prevention discussed Falls prevention discussed Falls prevention discussed;Education provided      Functional Status Survey:      Assessment & Plan  *** There are no diagnoses linked to this encounter.

## 2022-02-26 LAB — PAP LB (LIQUID-BASED)

## 2022-02-26 LAB — SPECIMEN STATUS REPORT

## 2022-03-11 ENCOUNTER — Encounter: Payer: Self-pay | Admitting: Oncology

## 2022-03-11 ENCOUNTER — Encounter: Payer: Self-pay | Admitting: Nurse Practitioner

## 2022-03-12 ENCOUNTER — Encounter: Payer: Self-pay | Admitting: Oncology

## 2022-03-16 ENCOUNTER — Inpatient Hospital Stay: Payer: Managed Care, Other (non HMO) | Admitting: Nurse Practitioner

## 2022-03-16 ENCOUNTER — Inpatient Hospital Stay: Payer: Managed Care, Other (non HMO)

## 2022-03-26 ENCOUNTER — Ambulatory Visit: Payer: Managed Care, Other (non HMO) | Admitting: Nurse Practitioner

## 2022-03-26 ENCOUNTER — Other Ambulatory Visit: Payer: Self-pay

## 2022-03-26 ENCOUNTER — Other Ambulatory Visit (HOSPITAL_COMMUNITY)
Admission: RE | Admit: 2022-03-26 | Discharge: 2022-03-26 | Disposition: A | Discharge status: Home / Self Care | Payer: Managed Care, Other (non HMO) | Source: Ambulatory Visit | Attending: Nurse Practitioner | Admitting: Nurse Practitioner

## 2022-03-26 ENCOUNTER — Encounter: Payer: Self-pay | Admitting: Nurse Practitioner

## 2022-03-26 VITALS — BP 130/76 | HR 96 | Temp 98.1°F | Resp 16 | Wt 225.0 lb

## 2022-03-26 DIAGNOSIS — N898 Other specified noninflammatory disorders of vagina: Secondary | ICD-10-CM | POA: Diagnosis not present

## 2022-03-26 DIAGNOSIS — R300 Dysuria: Secondary | ICD-10-CM | POA: Diagnosis not present

## 2022-03-26 DIAGNOSIS — R311 Benign essential microscopic hematuria: Secondary | ICD-10-CM | POA: Insufficient documentation

## 2022-03-26 LAB — POCT URINALYSIS DIPSTICK
Bilirubin, UA: NEGATIVE
Glucose, UA: POSITIVE — AB
Ketones, UA: NEGATIVE
Nitrite, UA: NEGATIVE
Protein, UA: POSITIVE — AB
Spec Grav, UA: 1.02 (ref 1.010–1.025)
Urobilinogen, UA: 0.2 E.U./dL
pH, UA: 5 (ref 5.0–8.0)

## 2022-03-26 MED ORDER — FLUCONAZOLE 150 MG PO TABS: 150.0000 mg | ORAL_TABLET | ORAL | 0 refills | Status: DC | PRN

## 2022-03-26 MED ORDER — SULFAMETHOXAZOLE-TRIMETHOPRIM 800-160 MG PO TABS: 1.0000 | ORAL_TABLET | Freq: Two times a day (BID) | ORAL | 0 refills | Status: AC

## 2022-03-26 NOTE — Progress Notes (Signed)
BP 130/76   Pulse 96   Temp 98.1 F (36.7 C) (Oral)   Resp 16   Wt 251 lb 11.2 oz (114.2 kg)   SpO2 98%   BMI 39.42 kg/m    Subjective:    Patient ID: Tabitha Shaw, female    DOB: June 24, 1960, 62 y.o.   MRN: [id number]  HPI: Tabitha Shaw is a 62 y.o. female  Chief Complaint  Patient presents with   Hematuria    Burning for 3 days   Dysuria/vaginal discharge:  patient reports symptoms started three days ago. Symptoms include dysuria, vaginal discharge ( white) , itching and pressure. She denies any fever or back pain. Urine dip shows positive glucose, blood, protein, and leukocytes. Discussed holding off on treatment until urine culture, patient would really like to start treatment at this time. Will start  bactrim and diflucan. Sent of urine culture and vaginal swab.   Relevant past medical, surgical, family and social history reviewed and updated as indicated. Interim medical history since our last visit reviewed. Allergies and medications reviewed and updated.  Review of Systems  Constitutional: Negative for fever or weight change.  Respiratory: Negative for cough and shortness of breath.   Cardiovascular: Negative for chest pain or palpitations.  Gastrointestinal: Negative for abdominal pain, no bowel changes.  GU: positive for vaginal discharge and dysuria Musculoskeletal: Negative for gait problem or joint swelling.  Skin: Negative for rash.  Neurological: Negative for dizziness or headache.  No other specific complaints in a complete review of systems (except as listed in HPI above).      Objective:    BP 130/76   Pulse 96   Temp 98.1 F (36.7 C) (Oral)   Resp 16   Wt 251 lb 11.2 oz (114.2 kg)   SpO2 98%   BMI 39.42 kg/m   Wt Readings from Last 3 Encounters:  03/26/22 251 lb 11.2 oz (114.2 kg)  02/23/22 223 lb (101.2 kg)  01/22/22 222 lb (100.7 kg)    Physical Exam  Constitutional: Patient appears well-developed and well-nourished. Obese   No distress.  HEENT: head atraumatic, normocephalic, pupils equal and reactive to light, neck supple Cardiovascular: Normal rate, regular rhythm and normal heart sounds.  No murmur heard. No BLE edema. Pulmonary/Chest: Effort normal and breath sounds normal. No respiratory distress. Abdominal: Soft.  There is no tenderness. Psychiatric: Patient has a normal mood and affect. behavior is normal. Judgment and thought content normal.  Results for orders placed or performed in visit on 03/26/22  POCT urinalysis dipstick  Result Value Ref Range   Color, UA yellow    Clarity, UA clear    Glucose, UA Positive (A) Negative   Bilirubin, UA negative    Ketones, UA negative    Spec Grav, UA 1.020 1.010 - 1.025   Blood, UA large    pH, UA 5.0 5.0 - 8.0   Protein, UA Positive (A) Negative   Urobilinogen, UA 0.2 0.2 or 1.0 E.U./dL   Nitrite, UA negative    Leukocytes, UA Small (1+) (A) Negative   Appearance clear    Odor none       Assessment & Plan:   Problem List Items Addressed This Visit   None Visit Diagnoses     Dysuria    -  Primary   urine sent for culture, push fluids start bactrim.   Relevant Medications   sulfamethoxazole-trimethoprim (BACTRIM DS) 800-160 MG tablet   Other Relevant Orders  POCT urinalysis dipstick (Completed)   Urine Culture   Cervicovaginal ancillary only   Vaginal discharge       vaginal swab sent to lab, start diflucan   Relevant Medications   fluconazole (DIFLUCAN) 150 MG tablet   Other Relevant Orders   Cervicovaginal ancillary only        Follow up plan: Return if symptoms worsen or fail to improve.

## 2022-03-28 LAB — URINE CULTURE
MICRO NUMBER:: 14663388
SPECIMEN QUALITY:: ADEQUATE

## 2022-03-30 ENCOUNTER — Encounter: Payer: Self-pay | Admitting: Nurse Practitioner

## 2022-03-30 ENCOUNTER — Other Ambulatory Visit: Payer: Self-pay | Admitting: Internal Medicine

## 2022-03-30 DIAGNOSIS — N898 Other specified noninflammatory disorders of vagina: Secondary | ICD-10-CM

## 2022-03-30 LAB — CERVICOVAGINAL ANCILLARY ONLY
Bacterial Vaginitis (gardnerella): NEGATIVE
Candida Glabrata: NEGATIVE
Candida Vaginitis: POSITIVE — AB
Chlamydia: NEGATIVE
Comment: NEGATIVE
Comment: NEGATIVE
Comment: NEGATIVE
Comment: NEGATIVE
Comment: NEGATIVE
Comment: NORMAL
Neisseria Gonorrhea: NEGATIVE
Trichomonas: NEGATIVE

## 2022-05-07 ENCOUNTER — Other Ambulatory Visit: Payer: Self-pay | Admitting: Family Medicine

## 2022-05-07 DIAGNOSIS — E039 Hypothyroidism, unspecified: Secondary | ICD-10-CM

## 2022-05-08 MED ORDER — LEVOTHYROXINE SODIUM 75 MCG PO TABS: 75.0000 ug | ORAL_TABLET | Freq: Every day | ORAL | 0 refills | Status: DC

## 2022-05-20 ENCOUNTER — Ambulatory Visit
Admission: RE | Admit: 2022-05-20 | Discharge: 2022-05-20 | Disposition: A | Discharge status: Home / Self Care | Payer: Managed Care, Other (non HMO) | Source: Ambulatory Visit | Attending: Family Medicine | Admitting: Family Medicine

## 2022-05-20 DIAGNOSIS — M8588 Other specified disorders of bone density and structure, other site: Secondary | ICD-10-CM | POA: Diagnosis present

## 2022-05-20 LAB — HM DIABETES EYE EXAM

## 2022-05-25 NOTE — Progress Notes (Deleted)
Name: Tabitha Shaw   MRN: [id number]    DOB: Oct 14, 1960   Date:05/25/2022       Progress Note  Subjective  Chief Complaint  Follow Up  HPI  Obesity: she is still taking Ozempic, BMI is below 35, she is following a healthy diet and increased physical activity from walking to going to the gym 4 days a weeks with friends and they have a personal trainer  She is feeling well. Weight is down from 249 in Oct 22 to 222 lbs today   B12 deficiency: she has been taking SL B12 and we will recheck it before her next visit   Hypothyroidism: she is taking Synthroid  daily but only half on Sundays but last TSH still suppressed, with normal T3 and T4, we will try skipping Sunday dose and recheck in 6 weeks She has chronic dry skin, no change in bowel movements. She is compliant with her medication   DMII : hgbA1C spiked to 8.1% back in August 2020, A1C today is 5.8 % She is compliant  Ozempic and Farxiga for urine microalbuminuria that improved on her last check. Denies polyphagia, polydipsia or polyuria, but she has nocturia once per night - stable. LDL is at goal . She had one episode of yeast infection that was treated by gyn    HTN: she is down to 10 mg of Lotensin and bp is at goal, no dizziness, chest pain or palpitation   Hyperlipidemia: she is now on Crestor and LDL was at goal, continue medication and recheck labs prior to next visit   History of Primary  Hyperparathyroidism: seen by Dr. Tedd Sias and referred to Dr. Selena Batten, found to have a right lower parathyroid enlargement and had surgery on 11/26/2017, and has been released since, last Pth was normal   Hemochromatosis : sees Dr Orlie Dakin and has phlebotomy . She has a follow up in Dec   Memory difficulties: she is worried because daughter noticed she has been forgetful, there is a family history of Alzheimer's dementia, and her 23&Me test was positive. She saw Dr. Sherryll Burger, she was diagnosed with mild cognitive dysfunction, she has been  compliant with CPAP. Dr. Sherryll Burger advised her to take a baby aspirin and no further follow ups required   Supra Sellar Mass: found on MRI brain, had it repeated 02/23   1. 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 Otherwise unremarkable brain MRI. 2. Essentially stable NeuroQuant volumetric analysis of the brain, see details on [employer].   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 Tiburcio Pea, she had repeat pap smear April 2022 that was normal , she had a repeat pap smear done April 2023 that was normal but positive HPV   OSA:she has the CPAP machine now , but only able to tolerate a few hours most nights of the week, when she does not wear it she feels tired   Vitamin D: last vitamin D was towards high end normal, she is now taking vitamin D every other week. We will recheck labs   Patient Active Problem List   Diagnosis Date Noted   Obesity (BMI 30.0-34.9) 09/03/2021   History of parathyroidectomy 08/06/2021   S/P laparoscopic supracervical hysterectomy 04/23/2020   Dysplasia of cervix, low grade (CIN 1) 11/22/2019   ASCUS with positive high risk HPV cervical 10/24/2019   Personal history of colonic polyps    Polyp of colon    Cervical high risk HPV (human  papillomavirus) test positive 09/03/2016   B12 deficiency 09/04/2015   Elevated PTHrP level 09/04/2015   Benign hypertension 09/12/2014   Acanthosis nigricans 09/09/2014   Diabetes mellitus with renal manifestation ([facility]) 09/09/2014   Dyslipidemia due to type 2 diabetes mellitus ([facility]) 09/09/2014   Abnormal electrocardiogram 09/09/2014   Enlarged kidney 09/09/2014   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[facility]) 09/09/2014   Enlarged liver 09/09/2014   Hypercalcemia 09/09/2014   Adult hypothyroidism 09/09/2014   Microalbuminuria 09/09/2014   Spinal stenosis of lumbar region with neurogenic claudication 09/09/2014   Osteopenia 09/09/2014   Vitamin D deficiency 09/09/2014    Past Surgical History:   Procedure Laterality Date   ABDOMINAL HYSTERECTOMY  2005   blood transfusion yearly     BREAST REDUCTION SURGERY     CESAREAN SECTION     CESAREAN SECTION  1992   CHOLECYSTECTOMY  2000   COLONOSCOPY WITH PROPOFOL N/A 11/11/2018   Procedure: COLONOSCOPY WITH PROPOFOL;  Surgeon: Pasty Spillers, MD;  Location: [facility] END[facility]COPY;  Service: Endoscopy;  Laterality: N/A;   COLONOSCOPY WITH PROPOFOL N/A 02/16/2020   Procedure: COLONOSCOPY WITH PROPOFOL;  Surgeon: Pasty Spillers, MD;  Location: [facility] ENDOSCOPY;  Service: Endoscopy;  Laterality: N/A;   lapcholecystectomy     PARATHYROIDECTOMY Right 11/26/2017   right lower , [facility] at [facility]   REDUCTION MAMMAPLASTY Bilateral 1985   VESICOVAGINAL FISTULA CLOSURE W/ [facility]      Family History  Problem Relation Age of Onset   Colon polyps Mother    Hemachromatosis Mother    Hypertension Mother    Depression Sister    Hypertension Brother    Colon polyps Brother    Cancer Father    Breast cancer Cousin    Breast cancer Cousin     Social History   Tobacco Use   Smoking status: Former    Packs/day: 0.25    Years: 8.00    Additional pack years: 0.00    Total pack years: 2.00    Types: Cigarettes    Start date: 01/19/1982    Quit date: 01/19/1990    Years since quitting: 32.3   Smokeless tobacco: Never  Substance Use Topics   Alcohol use: Yes    Alcohol/week: 0.0 standard drinks of alcohol    Comment: occasionally     Current Outpatient Medications:    ASPIRIN 81 PO, Take 81 mg by mouth daily as needed., Disp: , Rfl:    benazepril (LOTENSIN) 10 MG tablet, Take 1 tablet (10 mg total) by mouth daily., Disp: 90 tablet, Rfl: 1   Blood Glucose Monitoring Suppl (CONTOUR NEXT EZ) w/Device KIT, USE TO CHECK BLOOD SUGAR TWICE  DAILY AS DIRECTED, Disp: 1 kit, Rfl: 0   dapagliflozin propanediol (FARXIGA) 10 MG TABS tablet, TAKE 1 TABLET(10 MG) BY MOUTH DAILY BEFORE BREAKFAST, Disp: 90 tablet, Rfl: 1   fluconazole (DIFLUCAN) 150 MG tablet,  Take 1 tablet (150 mg total) by mouth every 3 (three) days as needed (for vaginal itching/yeast infection sx)., Disp: 2 tablet, Rfl: 0   glucose blood (CONTOUR NEXT TEST) test strip, 1 each by Other route daily before breakfast. Use as instructed, Disp: 100 each, Rfl: 12   levothyroxine (SYNTHROID) 75 MCG tablet, Take 1 tablet (75 mcg total) by mouth daily before breakfast. Skip Sundays, Disp: 90 tablet, Rfl: 0   OZEMPIC, 1 MG/DOSE, 4 MG/3ML SOPN, INJECT 1 MG UNDER THE SKIN ONCE A WEEK, Disp: 9 mL, Rfl: 1   rosuvastatin (CRESTOR) 40 MG tablet, TAKE 1 TABLET BY  MOUTH DAILY, Disp: 90 tablet, Rfl: 1   Vitamin D, Ergocalciferol, (DRISDOL) 1.25 MG (50000 UNIT) CAPS capsule, TAKE 1 CAPSULE BY MOUTH EVERY 7 DAYS, Disp: 12 capsule, Rfl: 1  No Known Allergies  I personally reviewed active problem list, medication list, allergies, family history, social history, health maintenance with the patient/caregiver today.   ROS  ***  Objective  There were no vitals filed for this visit.  There is no height or weight on file to calculate BMI.  Physical Exam ***   Diabetic Foot Exam: Diabetic Foot Exam - Simple   No data filed    ***  PHQ2/9:    03/26/2022    1:07 PM 02/23/2022    7:57 AM 01/22/2022   11:20 AM 12/09/2021    7:48 AM 09/03/2021    1:36 PM  Depression screen PHQ 2/9  Decreased Interest 0 0 0 0 0  Down, Depressed, Hopeless 0 0 0 0 0  PHQ - 2 Score 0 0 0 0 0  Altered sleeping  1 0 0 0  Tired, decreased energy  1 0 1 0  Change in appetite  0 0 0 0  Feeling bad or failure about yourself   0 0 0 0  Trouble concentrating  0 0 0 0  Moving slowly or fidgety/restless  0 0 0 0  Suicidal thoughts  0 0 0 0  PHQ-9 Score  2 0 1 0    phq 9 is {gen pos [id number]   Fall Risk:    03/26/2022    1:07 PM 02/23/2022    7:57 AM 01/22/2022   11:20 AM 12/09/2021    7:48 AM 09/03/2021    1:36 PM  Fall Risk   Falls in the past year? 0 0 0 0 0  Number falls in past yr: 0 0  0   Injury with  Fall? 0 0  0   Risk for fall due to :  No Fall Risks No Fall Risks No Fall Risks No Fall Risks  Follow up  Falls prevention discussed Falls prevention discussed;Education provided;Falls evaluation completed Falls prevention discussed Falls prevention discussed      Functional Status Survey:      Assessment & Plan  *** There are no diagnoses linked to this encounter.

## 2022-05-26 ENCOUNTER — Ambulatory Visit: Payer: Managed Care, Other (non HMO) | Admitting: Family Medicine

## 2022-05-26 DIAGNOSIS — E1129 Type 2 diabetes mellitus with other diabetic kidney complication: Secondary | ICD-10-CM

## 2022-05-31 ENCOUNTER — Other Ambulatory Visit: Payer: Self-pay | Admitting: Family Medicine

## 2022-05-31 DIAGNOSIS — I10 Essential (primary) hypertension: Secondary | ICD-10-CM

## 2022-06-01 ENCOUNTER — Other Ambulatory Visit: Payer: Self-pay

## 2022-06-01 DIAGNOSIS — I10 Essential (primary) hypertension: Secondary | ICD-10-CM

## 2022-06-01 NOTE — Telephone Encounter (Signed)
Regular ov sch'd for 5.21.2024 with Dr Carlynn Purl

## 2022-06-01 NOTE — Telephone Encounter (Signed)
Pt already has a CPE scheduled 

## 2022-06-02 NOTE — Telephone Encounter (Signed)
Pt states she enough medications until her appt

## 2022-06-02 NOTE — Telephone Encounter (Signed)
Yes she has enough to last until appt

## 2022-06-08 NOTE — Progress Notes (Unsigned)
Name: Tabitha Shaw   MRN: [id number]    DOB: 01-Sep-1960   Date:06/09/2022       Progress Note  Subjective  Chief Complaint  Medication Refill  HPI  Morbid obesity: BMI is again above 35, she is on Ozempic 1 mg, weight was low 220 lbs but is up to 228.7 lbs. She has been physically active and eating healthier , discussed portion control   B12 deficiency: she has been taking SL B12 and doing well   Hypothyroidism: she is taking Synthroid  daily and skipping Sundays and last TSH was finally back to normal  She has chronic dry skin, no change in bowel movements.   DMII : hgbA1C spiked to 8.1% back in August 2020, A1C today is 5.9 % She is compliant  Ozempic  1mg  and Farxiga for urine microalbuminuria however she has recurrent UTI 's and vaginitis and we will stop medication today and increase dose of Ozempic Denies polyphagia, polydipsia or polyuria, but she has nocturia once per night - stable. LDL is at goal .    HTN: she is down to 10 mg of Lotensin and bp is at goal, no dizziness, chest pain or palpitation . Continue current dose   Hyperlipidemia: she is now on Crestor and LDL was at goal, continue medication and recheck labs prior to next visit   History of Primary  Hyperparathyroidism: seen by Dr. Tedd Sias and referred to Dr. Selena Batten, found to have a right lower parathyroid enlargement and had surgery on 11/26/2017, and has been released since, last Pth was normal and also calcium level    Hemochromatosis : She has an appointment with Dr. Orlie Dakin this week   Memory difficulties: she is worried because daughter noticed she has been forgetful, there is a family history of Alzheimer's dementia, and her 23&Me test was positive. She saw Dr. Sherryll Burger, she was diagnosed with mild cognitive dysfunction, she has been compliant with CPAP. She is also taking baby aspirin as recommended by Dr. Kathi Simpers Sellar Mass: found on MRI brain, had it repeated 02/23   1. 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 Otherwise unremarkable brain MRI. 2. Essentially stable NeuroQuant volumetric analysis of the brain, see details on [employer].   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 Tiburcio Pea, she had repeat pap smear April 2022 that was normal , she had a repeat pap smear done April 2023 that was normal but positive HPV , last Pap smear was normal  but was vaginal wall since I could not see the cervix, advised patient to have an exam done there near year   OSA:she has the CPAP at home but is not wearing it She has lost a lot of weight. She has purchased and oral device   Vitamin D: last vitamin D was towards high end normal, she is now taking vitamin D every other week.Last level was at goal   Patient Active Problem List   Diagnosis Date Noted   Obesity (BMI 30.0-34.9) 09/03/2021   History of parathyroidectomy 08/06/2021   S/P laparoscopic supracervical hysterectomy 04/23/2020   Dysplasia of cervix, low grade (CIN 1) 11/22/2019   ASCUS with positive high risk HPV cervical 10/24/2019   Personal history of colonic polyps    Polyp of colon    Cervical high risk HPV (human papillomavirus) test positive 09/03/2016   B12 deficiency 09/04/2015   Elevated PTHrP level 09/04/2015   Benign hypertension 09/12/2014  Acanthosis nigricans 09/09/2014   Diabetes mellitus with renal manifestation (HCC) 09/09/2014   Dyslipidemia due to type 2 diabetes mellitus (HCC) 09/09/2014   Abnormal electrocardiogram 09/09/2014   Enlarged kidney 09/09/2014   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HCC) 09/09/2014   Enlarged liver 09/09/2014   Hypercalcemia 09/09/2014   Adult hypothyroidism 09/09/2014   Microalbuminuria 09/09/2014   Spinal stenosis of lumbar region with neurogenic claudication 09/09/2014   Osteopenia 09/09/2014   Vitamin D deficiency 09/09/2014    Past Surgical History:  Procedure Laterality Date   ABDOMINAL HYSTERECTOMY  2005   blood transfusion yearly      BREAST REDUCTION SURGERY     CESAREAN SECTION     CESAREAN SECTION  1992   CHOLECYSTECTOMY  2000   COLONOSCOPY WITH PROPOFOL N/A 11/11/2018   Procedure: COLONOSCOPY WITH PROPOFOL;  Surgeon: Pasty Spillers, MD;  Location: [facility] ENDOSCOPY;  Service: Endoscopy;  Laterality: N/A;   COLONOSCOPY WITH PROPOFOL N/A 02/16/2020   Procedure: COLONOSCOPY WITH PROPOFOL;  Surgeon: Pasty Spillers, MD;  Location: [facility] ENDOSCOPY;  Service: Endoscopy;  Laterality: N/A;   lapcholecystectomy     PARATHYROIDECTOMY Right 11/26/2017   right lower , Dr[facility]   REDUCTION MAMMAPLASTY Bilateral 1985   VESICOVAGINAL FISTULA CLOSURE W/ TAH      Family History  Problem Relation Age of Onset   Colon polyps Mother    Hemachromatosis Mother    Hypertension Mother    Depression Sister    Hypertension Brother    Colon polyps Brother    Cancer Father    Breast cancer Cousin    Breast cancer Cousin     Social History   Tobacco Use   Smoking status: Former    Packs/day: 0.25    Years: 8.00    Additional pack years: 0.00    Total pack years: 2.00    Types: Cigarettes    Start date: 01/19/1982    Quit date: 01/19/1990    Years since quitting: 32.4   Smokeless tobacco: Never  Substance Use Topics   Alcohol use: Yes    Alcohol/week: 0.0 standard drinks of alcohol    Comment: occasionally     Current Outpatient Medications:    ASPIRIN 81 PO, Take 81 mg by mouth daily as needed., Disp: , Rfl:    Blood Glucose Monitoring Suppl (CONTOUR NEXT EZ) w/Device KIT, USE TO CHECK BLOOD SUGAR TWICE  DAILY AS DIRECTED, Disp: 1 kit, Rfl: 0   glucose blood (CONTOUR NEXT TEST) test strip, 1 each by Other route daily before breakfast. Use as instructed, Disp: 100 each, Rfl: 12   Multiple Vitamin (MULTIVITAMIN) capsule, Take 1 capsule by mouth daily., Disp: , Rfl:    Semaglutide, 2 MG/DOSE, 8 MG/3ML SOPN, Inject 2 mg as directed once a week., Disp: 9 mL, Rfl: 1   benazepril (LOTENSIN) 10 MG tablet, Take 1  tablet (10 mg total) by mouth daily., Disp: 90 tablet, Rfl: 1   fluconazole (DIFLUCAN) 150 MG tablet, Take 1 tablet (150 mg total) by mouth every 3 (three) days as needed (for vaginal itching/yeast infection sx)., Disp: 2 tablet, Rfl: 0   levothyroxine (SYNTHROID) 75 MCG tablet, Take 1 tablet (75 mcg total) by mouth daily before breakfast. Skip Sundays, Disp: 90 tablet, Rfl: 0   rosuvastatin (CRESTOR) 40 MG tablet, Take 1 tablet (40 mg total) by mouth daily., Disp: 90 tablet, Rfl: 1   Vitamin D, Ergocalciferol, (DRISDOL) 1.25 MG (50000 UNIT) CAPS capsule, Take 1 capsule (50,000 Units total) by mouth  every 14 (fourteen) days., Disp: 6 capsule, Rfl: 1  Allergies  Allergen Reactions   Farxiga [Dapagliflozin] Other (See Comments)    Dysuria and recurrent yeast infection     I personally reviewed active problem list, medication list, allergies, family history, social history, health maintenance with the patient/caregiver today.   ROS  Constitutional: Negative for fever, positive for  weight change.  Respiratory: Negative for cough and shortness of breath.   Cardiovascular: Negative for chest pain or palpitations.  Gastrointestinal: Negative for abdominal pain, no bowel changes.  Musculoskeletal: Negative for gait problem or joint swelling.  Skin: Negative for rash.  Neurological: Negative for dizziness or headache.  No other specific complaints in a complete review of systems (except as listed in HPI above).   Objective  Vitals:   06/09/22 1556  BP: 126/76  Pulse: 78  Resp: 16  Temp: 97.9 F (36.6 C)  TempSrc: Oral  SpO2: 94%  Weight: 228 lb 11.2 oz (103.7 kg)  Height: 5\' 7"  (1.702 m)    Body mass index is 35.82 kg/m.  Physical Exam  Constitutional: Patient appears well-developed and well-nourished. Obese  No distress.  HEENT: head atraumatic, normocephalic, pupils equal and reactive to light,, neck supple Cardiovascular: Normal rate, regular rhythm and normal heart sounds.   No murmur heard. No BLE edema. Pulmonary/Chest: Effort normal and breath sounds normal. No respiratory distress. Abdominal: Soft.  There is no tenderness. Psychiatric: Patient has a normal mood and affect. behavior is normal. Judgment and thought content normal.    Diabetic Foot Exam: Diabetic Foot Exam - Simple   Simple Foot Form Visual Inspection No deformities, no ulcerations, no other skin breakdown bilaterally: Yes Sensation Testing Intact to touch and monofilament testing bilaterally: Yes Pulse Check Posterior Tibialis and Dorsalis pulse intact bilaterally: Yes Comments      PHQ2/9:    06/09/2022    3:59 PM 03/26/2022    1:07 PM 02/23/2022    7:57 AM 01/22/2022   11:20 AM 12/09/2021    7:48 AM  Depression screen PHQ 2/9  Decreased Interest 0 0 0 0 0  Down, Depressed, Hopeless 0 0 0 0 0  PHQ - 2 Score 0 0 0 0 0  Altered sleeping 0  1 0 0  Tired, decreased energy 0  1 0 1  Change in appetite 0  0 0 0  Feeling bad or failure about yourself  0  0 0 0  Trouble concentrating 0  0 0 0  Moving slowly or fidgety/restless 0  0 0 0  Suicidal thoughts 0  0 0 0  PHQ-9 Score 0  2 0 1    phq 9 is negative   Fall Risk:    06/09/2022    3:59 PM 03/26/2022    1:07 PM 02/23/2022    7:57 AM 01/22/2022   11:20 AM 12/09/2021    7:48 AM  Fall Risk   Falls in the past year? 0 0 0 0 0  Number falls in past yr:  0 0  0  Injury with Fall?  0 0  0  Risk for fall due to : No Fall Risks  No Fall Risks No Fall Risks No Fall Risks  Follow up Falls prevention discussed  Falls prevention discussed Falls prevention discussed;Education provided;Falls evaluation completed Falls prevention discussed      Functional Status Survey: Is the patient deaf or have difficulty hearing?: No Does the patient have difficulty seeing, even when wearing glasses/contacts?: No Does the patient  have difficulty concentrating, remembering, or making decisions?: No Does the patient have difficulty walking or  climbing stairs?: No Does the patient have difficulty dressing or bathing?: No Does the patient have difficulty doing errands alone such as visiting a doctor's office or shopping?: No    Assessment & Plan  1. Type 2 diabetes mellitus with microalbuminuria, with long-term current use of insulin (HCC)  - POCT HgB A1C - HM Diabetes Foot Exam - Semaglutide, 2 MG/DOSE, 8 MG/3ML SOPN; Inject 2 mg as directed once a week.  Dispense: 9 mL; Refill: 1  2. 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HCC)  Keep follow up with Dr. Orlie Dakin  3. Dyslipidemia due to type 2 diabetes mellitus (HCC)  - rosuvastatin (CRESTOR) 40 MG tablet; Take 1 tablet (40 mg total) by mouth daily.  Dispense: 90 tablet; Refill: 1  4. Vaginal discharge  We will stop SGL-2 agonist due to side effects - POCT Urinalysis Dipstick - Urine Culture - GC/Chlamydia Probe Amp([facility]) - fluconazole (DIFLUCAN) 150 MG tablet; Take 1 tablet (150 mg total) by mouth every 3 (three) days as needed (for vaginal itching/yeast infection sx).  Dispense: 2 tablet; Refill: 0  5. Dysuria  - POCT Urinalysis Dipstick - Urine Culture  6. History of parathyroidectomy  Calcium is normal   7. Adult hypothyroidism  - levothyroxine (SYNTHROID) 75 MCG tablet; Take 1 tablet (75 mcg total) by mouth daily before breakfast. Skip Sundays  Dispense: 90 tablet; Refill: 0  8. Benign hypertension  bP is at goal , taking Benazepril   9. Osteopenia of lumbar spine  Discussed FRAX score  10. Vitamin D deficiency  - Vitamin D, Ergocalciferol, (DRISDOL) 1.25 MG (50000 UNIT) CAPS capsule; Take 1 capsule (50,000 Units total) by mouth every 14 (fourteen) days.  Dispense: 6 capsule; Refill: 1

## 2022-06-09 ENCOUNTER — Ambulatory Visit: Payer: Managed Care, Other (non HMO) | Admitting: Family Medicine

## 2022-06-09 ENCOUNTER — Encounter: Payer: Self-pay | Admitting: Family Medicine

## 2022-06-09 VITALS — BP 126/76 | HR 78 | Temp 97.9°F | Resp 16 | Ht 67.0 in | Wt 228.7 lb

## 2022-06-09 DIAGNOSIS — M8588 Other specified disorders of bone density and structure, other site: Secondary | ICD-10-CM

## 2022-06-09 DIAGNOSIS — I10 Essential (primary) hypertension: Secondary | ICD-10-CM

## 2022-06-09 DIAGNOSIS — E559 Vitamin D deficiency, unspecified: Secondary | ICD-10-CM

## 2022-06-09 DIAGNOSIS — E039 Hypothyroidism, unspecified: Secondary | ICD-10-CM

## 2022-06-09 DIAGNOSIS — Z9089 Acquired absence of other organs: Secondary | ICD-10-CM

## 2022-06-09 DIAGNOSIS — R300 Dysuria: Secondary | ICD-10-CM | POA: Diagnosis not present

## 2022-06-09 DIAGNOSIS — R809 Proteinuria, unspecified: Secondary | ICD-10-CM | POA: Diagnosis not present

## 2022-06-09 DIAGNOSIS — Z794 Long term (current) use of insulin: Secondary | ICD-10-CM | POA: Diagnosis not present

## 2022-06-09 DIAGNOSIS — N898 Other specified noninflammatory disorders of vagina: Secondary | ICD-10-CM | POA: Diagnosis not present

## 2022-06-09 DIAGNOSIS — E785 Hyperlipidemia, unspecified: Secondary | ICD-10-CM

## 2022-06-09 DIAGNOSIS — E1169 Type 2 diabetes mellitus with other specified complication: Secondary | ICD-10-CM

## 2022-06-09 DIAGNOSIS — E1129 Type 2 diabetes mellitus with other diabetic kidney complication: Secondary | ICD-10-CM | POA: Diagnosis not present

## 2022-06-09 LAB — POCT URINALYSIS DIPSTICK
Bilirubin, UA: NEGATIVE
Blood, UA: NEGATIVE
Glucose, UA: POSITIVE — AB
Ketones, UA: NEGATIVE
Leukocytes, UA: NEGATIVE
Nitrite, UA: NEGATIVE
Protein, UA: POSITIVE — AB
Spec Grav, UA: 1.01 (ref 1.010–1.025)
Urobilinogen, UA: 0.2 E.U./dL
pH, UA: 6 (ref 5.0–8.0)

## 2022-06-09 LAB — POCT GLYCOSYLATED HEMOGLOBIN (HGB A1C): Hemoglobin A1C: 5.9 % — AB (ref 4.0–5.6)

## 2022-06-09 MED ORDER — ROSUVASTATIN CALCIUM 40 MG PO TABS: 40.0000 mg | ORAL_TABLET | Freq: Every day | ORAL | 1 refills | Status: DC

## 2022-06-09 MED ORDER — VITAMIN D (ERGOCALCIFEROL) 1.25 MG (50000 UNIT) PO CAPS
50000.0000 [IU] | ORAL_CAPSULE | ORAL | 1 refills | Status: DC
Start: 2022-06-09 — End: 2023-04-12

## 2022-06-09 MED ORDER — LEVOTHYROXINE SODIUM 75 MCG PO TABS
75.0000 ug | ORAL_TABLET | Freq: Every day | ORAL | 0 refills | Status: DC
Start: 2022-06-09 — End: 2022-11-04

## 2022-06-09 MED ORDER — SEMAGLUTIDE (2 MG/DOSE) 8 MG/3ML ~~LOC~~ SOPN
2.0000 mg | PEN_INJECTOR | SUBCUTANEOUS | 1 refills | Status: DC
Start: 2022-06-09 — End: 2022-11-04

## 2022-06-09 MED ORDER — FLUCONAZOLE 150 MG PO TABS
150.0000 mg | ORAL_TABLET | ORAL | 0 refills | Status: DC | PRN
Start: 2022-06-09 — End: 2022-11-04

## 2022-06-09 MED ORDER — BENAZEPRIL HCL 10 MG PO TABS: 10.0000 mg | ORAL_TABLET | Freq: Every day | ORAL | 1 refills | Status: DC

## 2022-06-10 ENCOUNTER — Encounter: Payer: Self-pay | Admitting: Oncology

## 2022-06-11 ENCOUNTER — Inpatient Hospital Stay: Payer: Managed Care, Other (non HMO)

## 2022-06-11 ENCOUNTER — Encounter: Payer: Self-pay | Admitting: Nurse Practitioner

## 2022-06-11 ENCOUNTER — Inpatient Hospital Stay: Payer: Managed Care, Other (non HMO) | Attending: Oncology | Admitting: Nurse Practitioner

## 2022-06-11 NOTE — Progress Notes (Signed)
Pt in for follow up, denies any difficulties or concerns.  

## 2022-06-11 NOTE — Progress Notes (Signed)
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  INTERVAL HISTORY: Patient returns to clinic today for routine evaluation and continuation of phlebotomy.  She currently feels well and is asymptomatic. She has no neurologic complaints.  She denies any recent fevers or illnesses.  She denies any chest pain, shortness of breath, cough, or hemoptysis.  She denies any weakness or fatigue.  She has a good appetite and denies weight loss. She denies any nausea, vomiting, constipation, or diarrhea. She has no urinary complaints.  Patient feels at her baseline offers no specific complaints today.    REVIEW OF SYSTEMS:   Review of Systems  Constitutional: Negative.  Negative for fever, malaise/fatigue and weight loss.  Respiratory: Negative.  Negative for cough and shortness of breath.   Cardiovascular: Negative.  Negative for chest pain and leg swelling.  Gastrointestinal: Negative.  Negative for abdominal pain, blood in stool and melena.  Genitourinary: Negative.  Negative for dysuria.  Musculoskeletal: Negative.  Negative for back pain.  Skin: Negative.  Negative for rash.  Neurological: Negative.  Negative for sensory change, focal weakness and weakness.  Psychiatric/Behavioral: Negative.  The patient is not nervous/anxious.   As per HPI. Otherwise, a complete review of systems is negative.  PAST MEDICAL HISTORY: Past Medical History:  Diagnosis Date   Acquired acanthosis nigricans    Diabetes type 2, controlled (HCC)    if controlled was not specified   Hemochromatosis     Hepatomegaly    HLD (hyperlipidemia)    HTN (hypertension)    Hyperlipidemia    Hypertension    Hypertrophy of kidney    Hypothyroidism    Intertrigo    Obesity    Osteopenia    Postinflammatory hyperpigmentation    Snoring    Thyroid disease    Vitamin D deficiency     PAST SURGICAL HISTORY: Past Surgical History:  Procedure Laterality Date   ABDOMINAL HYSTERECTOMY  2005   blood transfusion yearly     BREAST REDUCTION SURGERY     CESAREAN SECTION     CESAREAN SECTION  1992   CHOLECYSTECTOMY  2000   COLONOSCOPY WITH PROPOFOL N/A 11/11/2018   Procedure: COLONOSCOPY WITH PROPOFOL;  Surgeon: Pasty Spillers, MD;  Location: [facility] ENDOSCOPY;  Service: Endoscopy;  Laterality: N/A;   COLONOSCOPY WITH PROPOFOL N/A 02/16/2020   Procedure: COLONOSCOPY WITH PROPOFOL;  Surgeon: Pasty Spillers, MD;  Location: [facility] ENDOSCOPY;  Service: Endoscopy;  Laterality: N/A;   lapcholecystectomy     PARATHYROIDECTOMY Right 11/26/2017   right lower , Dr. Sel[facility]   REDUCTION MAMMAPLASTY Bilateral 1985   VESICOVAGINAL FISTULA CLOSURE W/ TAH      FAMILY HISTORY Family History  Problem Relation Age of Onset   Colon polyps Mother    Hemachromatosis Mother    Hypertension Mother    Depression Sister    Hypertension Brother    Colon polyps Brother    Cancer Father    Breast cancer Cousin    Breast cancer Cousin        ADVANCED DIRECTIVES:    HEALTH MAINTENANCE: Social History   Tobacco Use   Smoking status: Former  Packs/day: 0.25    Years: 8.00    Additional pack years: 0.00    Total pack years: 2.00    Types: Cigarettes    Start date: 01/19/1982    Quit date: 01/19/1990    Years since quitting: 32.4   Smokeless tobacco: Never  Vaping Use   Vaping Use: Never used  Substance Use Topics   Alcohol use: Yes    Alcohol/week: 0.0 standard drinks of alcohol    Comment: occasionally   Drug use: No     Colonoscopy:  PAP:  Bone density:  Lipid panel:  Allergies   Allergen Reactions   Farxiga [Dapagliflozin] Other (See Comments)    Dysuria and recurrent yeast infection     Current Outpatient Medications  Medication Sig Dispense Refill   ASPIRIN 81 PO Take 81 mg by mouth daily as needed.     benazepril (LOTENSIN) 10 MG tablet Take 1 tablet (10 mg total) by mouth daily. 90 tablet 1   Blood Glucose Monitoring Suppl (CONTOUR NEXT EZ) w/Device KIT USE TO CHECK BLOOD SUGAR TWICE  DAILY AS DIRECTED 1 kit 0   fluconazole (DIFLUCAN) 150 MG tablet Take 1 tablet (150 mg total) by mouth every 3 (three) days as needed (for vaginal itching/yeast infection sx). 2 tablet 0   glucose blood (CONTOUR NEXT TEST) test strip 1 each by Other route daily before breakfast. Use as instructed 100 each 12   levothyroxine (SYNTHROID) 75 MCG tablet Take 1 tablet (75 mcg total) by mouth daily before breakfast. Skip Sundays 90 tablet 0   Multiple Vitamin (MULTIVITAMIN) capsule Take 1 capsule by mouth daily.     rosuvastatin (CRESTOR) 40 MG tablet Take 1 tablet (40 mg total) by mouth daily. 90 tablet 1   Semaglutide, 2 MG/DOSE, 8 MG/3ML SOPN Inject 2 mg as directed once a week. 9 mL 1   Vitamin D, Ergocalciferol, (DRISDOL) 1.25 MG (50000 UNIT) CAPS capsule Take 1 capsule (50,000 Units total) by mouth every 14 (fourteen) days. 6 capsule 1   No current facility-administered medications for this visit.    OBJECTIVE: Vitals:   06/11/22 1355  BP: 117/82  Pulse: 63  Resp: 16  Temp: (!) 96.2 F (35.7 C)     Body mass index is 35.71 kg/m.    ECOG FS:0 - Asymptomatic  General: Well-developed, well-nourished, no acute distress. Eyes: Pink conjunctiva, anicteric sclera. HEENT: Normocephalic, moist mucous membranes. Lungs: No audible wheezing or coughing. Heart: Regular rate and rhythm. Abdomen: Soft, nontender, no obvious distention. Musculoskeletal: No edema, cyanosis, or clubbing. Neuro: Alert, answering all questions appropriately. Cranial nerves grossly intact. Skin: No  rashes or petechiae noted. Psych: Normal affect.  LAB RESULTS:  Lab Results  Component Value Date   NA 142 02/18/2022   K 4.3 02/18/2022   CL 104 02/18/2022   CO2 22 02/18/2022   GLUCOSE 105 (H) 02/18/2022   BUN 11 02/18/2022   CREATININE 0.76 02/18/2022   CALCIUM 9.6 02/18/2022   PROT 7.5 02/18/2022   ALBUMIN 4.7 02/18/2022   AST 20 02/18/2022   ALT 19 02/18/2022   ALKPHOS 71 02/18/2022   BILITOT 0.7 02/18/2022   GFRNONAA 81 05/12/2019   GFRAA 94 05/12/2019    Lab Results  Component Value Date   WBC 7.9 02/18/2022   NEUTROABS 3.8 02/18/2022   HGB 17.2 (H) 02/18/2022   HCT 48.2 (H) 02/18/2022   MCV 105 (H) 02/18/2022   PLT 206 02/18/2022     STUDIES: DG Bone Density  Result Date:  05/20/2022 EXAM: DUAL X-RAY ABSORPTIOMETRY (DXA) FOR BONE MINERAL DENSITY IMPRESSION: Dear Alba Cory, Your patient Judah Devoll completed a FRAX assessment on 05/20/2022 using the [facility] iDXA DXA System (analysis version: 14.10) manufactured by [employer]. The following summarizes the results of our evaluation. PATIENT BIOGRAPHICAL: Name: Oluwadamilola, Dealmeida Patient ID: [id number] Birth Date: 07-02-1960 Height:    67.0 in. Gender:     Female    Age:        61.7       Weight:    228.0 lbs. Ethnicity:  Black                            Exam Date: 05/20/2022 FRAX* RESULTS:  (version: 3.5) 10-year Probability of Fracture1 Major Osteoporotic Fracture2 Hip Fracture 3.4% 0.3% Population: [place] (Black) Risk Factors: None Based on Femur (Right) Neck BMD 1 -The 10-year probability of fracture may be lower than reported if the patient has received treatment. 2 -Major Osteoporotic Fracture: Clinical Spine, Forearm, Hip or Shoulder *FRAX is a Armed forces logistics/support/administrative officer of the [employer] of [employer] for Metabolic Bone Disease, a World Science writer (WHO) [employer]. ASSESSMENT: The probability of a major osteoporotic fracture is 3.4% within the next ten years. The probability of a  hip fracture is 0.3% within the next ten years. . Your patient Delon Ahr completed a BMD test on 05/20/2022 using the [employer] iDXA DXA System (software version: 14.10) manufactured by [employer]. The following summarizes the results of our evaluation. Technologist: MTB PATIENT BIOGRAPHICAL: Name: Dorothee, Stem Patient ID: [id number] Birth Date: 18-Apr-1960 Height: 67.0 in. Gender: Female Exam Date: 05/20/2022 Weight: 228.0 lbs. Indications: Hypothyroid, Postmenopausal, Diabetic, Hysterectomy Fractures: Treatments: Vitamin D DENSITOMETRY RESULTS: Site          Region      Measured Date Measured Age WHO Classification Young Adult T-score BMD         %Change vs. Previous Significant Change (*) AP Spine L1-L4 (L2) 05/20/2022 59.7 Osteopenia -2.4 0.892 g/cm2 -5.5% Yes AP Spine L1-L4 (L2) 02/08/2018 55.4 Osteopenia -2.0 0.944 g/cm2 -7.1% Yes AP Spine L1-L4 (L2) 04/17/2014 51.6 Osteopenia -1.4 1.016 g/cm2 - - DualFemur Total Right 05/20/2022 59.7 Osteopenia -1.6 0.800 g/cm2 -3.5% Yes DualFemur Total Right 02/08/2018 55.4 Osteopenia -1.4 0.829 g/cm2 -10.9% Yes DualFemur Total Right 04/17/2014 51.6 Normal -0.6 0.930 g/cm2 - - DualFemur Total Mean 05/20/2022 59.7 Osteopenia -1.3 0.846 g/cm2 -3.9% Yes DualFemur Total Mean 02/08/2018 55.4 Normal -1.0 0.880 g/cm2 -7.4% Yes DualFemur Total Mean 04/17/2014 51.6 Normal -0.5 0.950 g/cm2 - - Right Forearm Radius 33% 05/20/2022 59.7 Osteopenia -1.5 0.748 g/cm2 -4.0% - Right Forearm Radius 33% 02/08/2018 55.4 Osteopenia -1.1 0.779 g/cm2 -10.8% Yes Right Forearm Radius 33% 04/17/2014 51.6 Normal 0.0 0.873 g/cm2 - - ASSESSMENT: The BMD measured at AP Spine L1-L4 (L2) is 0.892 g/cm2 with a T-score of -2.4. This patient is considered osteopenic according to World Health Organization [facility]) criteria. The scan quality is good. L-2 was excluded due to degenerative changes. Compared with prior study, there has been significant decrease in the spine. Compared with  prior study, there has been significant decrease in the total hip. World Health Organization [facility]) criteria for post-menopausal, Caucasian Women: Normal:                   T-score at or above -1 SD Osteopenia/low bone mass: T-score between -1 and -2.5 SD Osteoporosis:  T-score at or below -2.5 SD RECOMMENDATIONS: 1. All patients should optimize calcium and vitamin D intake. 2. Consider FDA-approved medical therapies in postmenopausal women and men aged 19 years and older, based on the following: a. A hip or vertebral(clinical or morphometric) fracture b. T-score < -2.5 at the femoral neck or spine after appropriate evaluation to exclude secondary causes c. Low bone mass (T-score between -1.0 and -2.5 at the femoral neck or spine) and a 10-year probability of a hip fracture > 3% or a 10-year probability of a major osteoporosis-related fracture > 20% based on the [place]-adapted WHO algorithm 3. Clinician judgment and/or patient preferences may indicate treatment for people with 10-year fracture probabilities above or below these levels FOLLOW-UP: People with diagnosed cases of osteoporosis or at high risk for fracture should have regular bone mineral density tests. For patients eligible for Medicare, routine testing is allowed once every 2 years. The testing frequency can be increased to one year for patients who have rapidly progressing disease, those who are receiving or discontinuing medical therapy to restore bone mass, or have additional risk factors. I have reviewed this report, and agree with the above findings. [facility] Electronically Signed   By: Romona Curls M.D.   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  PLAN:    1.  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  Her iron saturation is 69% and total iron is elevated at 168.  All her other laboratory  work continues to be within normal limits including AFP.  Goal ferritin is between 50 and 100.  Proceed with 500 mL phlebotomy today.   2.  Parathyroid surgery: Continue treatment and follow-up at [facility]. 3.  Brain MRI: 6 mm likely benign lesion noted.  Continue follow-up with neurology as indicated. 4. Cascade testing: her daughter is a carrier for hemochromatosis. We reviewed need for regular lab monitoring with pcp. Recommend other relatives be tested as well.   Disposition:  4 months - labs (cbc, ferritin, iron studies) at [facility] & poss phlebotomy 8 months - labs (cbc, ferritin, iron studies) at [facility] & poss phlebotomy 12 months- labs ( cbc, ferritin, iron studies, afp) at [facility], see me, & poss phlebotomy - [place]  I spent a total of 20 minutes reviewing chart data, face-to-face evaluation with the patient, counseling and coordination of care as detailed above.  Patient expressed understanding and was in agreement with this plan. She also understands that She can call clinic at any time with any questions, concerns, or complaints.   Alinda Dooms, NP   06/11/2022

## 2022-06-12 LAB — SPECIMEN STATUS REPORT

## 2022-06-12 LAB — URINE CULTURE

## 2022-06-13 LAB — GC/CHLAMYDIA PROBE AMP
Chlamydia trachomatis, NAA: NEGATIVE
Neisseria Gonorrhoeae by PCR: NEGATIVE

## 2022-06-13 LAB — SPECIMEN STATUS REPORT

## 2022-08-16 ENCOUNTER — Other Ambulatory Visit: Payer: Self-pay | Admitting: Family Medicine

## 2022-08-16 DIAGNOSIS — E1129 Type 2 diabetes mellitus with other diabetic kidney complication: Secondary | ICD-10-CM

## 2022-08-29 ENCOUNTER — Other Ambulatory Visit: Payer: Self-pay | Admitting: Family Medicine

## 2022-08-29 DIAGNOSIS — E1129 Type 2 diabetes mellitus with other diabetic kidney complication: Secondary | ICD-10-CM

## 2022-11-02 ENCOUNTER — Encounter: Payer: Self-pay | Admitting: Nurse Practitioner

## 2022-11-03 NOTE — Progress Notes (Unsigned)
Name: Tabitha Shaw   MRN: [id number]    DOB: 1961-01-18   Date:11/04/2022       Progress Note  Subjective  Chief Complaint  Follow Up  HPI  Morbid obesity: BMI is again above 35, she is on Ozempic 2  mg dose since May 2024 , weight was low 220 lbs but is trending up and today is up to 233 lbs . She has been physically active and eating healthier , discussed portion control   B12 deficiency: she has been taking SL B12 and doing well   Hypothyroidism: she is taking Synthroid  daily and skipping Sundays and last TSH was finally back to normal  She has chronic dry skin, no change in bowel movements. Recheck levels yearly   DMII : hgbA1C spiked to 8.1% back in August 2020, A1C today is 5.7 % She is compliant  Ozempic  2 mg and she was also on  Farxiga for urine microalbuminuria however she has recurrent UTI 's and vaginitis and since medication stopped vaginitis and uti's resolved.  Denies polyphagia, polydipsia or polyuria, but she has nocturia once per night - stable. She is on statin therapy    HTN: she is down to 10 mg of Lotensin and bp is at goal, no dizziness, chest pain or palpitation . Continue current dose   Hyperlipidemia: she is now on Crestor and LDL was at goal, continue medication, recheck labs   History of Primary  Hyperparathyroidism: seen by Dr. Tedd Sias and referred to Dr. Selena Batten, found to have a right lower parathyroid enlargement and had surgery on 11/26/2017, and has been released since, last Pth was normal and also calcium level , we will recheck labs before her next visit    Hemochromatosis : She has an appointment with Dr. Orlie Dakin this week   Memory difficulties: she is worried because daughter noticed she has been forgetful, there is a family history of Alzheimer's dementia, and her 23&Me test was positive. She saw Dr. Sherryll Burger, she was diagnosed with mild cognitive dysfunction, she has not been compliant with CPAP. She is also taking baby aspirin as recommended by  Dr. Kathi Simpers Sellar Mass: found on MRI brain, had it repeated 02/23   1. 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 Otherwise unremarkable brain MRI. 2. Essentially stable NeuroQuant volumetric analysis of the brain, see details on [employer].   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 Tiburcio Pea, she had repeat pap smear April 2022 that was normal , she had a repeat pap smear done April 2023 that was normal but positive HPV , last Pap smear was normal  but was vaginal wall since I could not see the cervix, advised patient to have an exam done there near year and if I am unable to see the cervix we will refer her back to gyn   OSA: she has the CPAP at home but is not wearing it.  She has purchased and oral device. Unchanged   Vitamin D: she is still taking vitamin D rx but only every other week since last level was elevated  Patient Active Problem List   Diagnosis Date Noted   Obesity (BMI 30.0-34.9) 09/03/2021   History of parathyroidectomy 08/06/2021   S/P laparoscopic supracervical hysterectomy 04/23/2020   Dysplasia of cervix, low grade (CIN 1) 11/22/2019   ASCUS with positive high risk HPV cervical 10/24/2019   History of colonic polyps    Polyp  of colon    Cervical high risk HPV (human papillomavirus) test positive 09/03/2016   B12 deficiency 09/04/2015   Elevated PTHrP level 09/04/2015   Benign hypertension 09/12/2014   Acanthosis nigricans 09/09/2014   Diabetes mellitus with renal manifestation (HCC) 09/09/2014   Dyslipidemia due to type 2 diabetes mellitus (HCC) 09/09/2014   Abnormal electrocardiogram 09/09/2014   Enlarged kidney 09/09/2014   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HCC) 09/09/2014   Enlarged liver 09/09/2014   Hypercalcemia 09/09/2014   Adult hypothyroidism 09/09/2014   Microalbuminuria 09/09/2014   Spinal stenosis of lumbar region with neurogenic claudication 09/09/2014   Osteopenia 09/09/2014   Vitamin D deficiency  09/09/2014    Past Surgical History:  Procedure Laterality Date   ABDOMINAL HYSTERECTOMY  2005   blood transfusion yearly     BREAST REDUCTION SURGERY     CESAREAN SECTION     CESAREAN SECTION  1992   CHOLECYSTECTOMY  2000   COLONOSCOPY WITH PROPOFOL N/A 11/11/2018   Procedure: COLONOSCOPY WITH PROPOFOL;  Surgeon: Pasty Spillers, MD;  Location: [facility] ENDOSCOPY;  Service: Endoscopy;  Laterality: N/A;   COLONOSCOPY WITH PROPOFOL N/A 02/16/2020   Procedure: COLONOSCOPY WITH PROPOFOL;  Surgeon: Pasty Spillers, MD;  Location: [facility] ENDOSCOPY;  Service: Endoscopy;  Laterality: N/A;   lapcholecystectomy     PARATHYROIDECTOMY Right 11/26/2017   right lower , Dr. Selena [facility] at [facility]   REDUCTION MAMMAPLASTY Bilateral 1985   VESICOVAGINAL FISTULA CLOSURE W/ TAH      Family History  Problem Relation Age of Onset   Colon polyps Mother    Hemachromatosis Mother    Hypertension Mother    Depression Sister    Hypertension Brother    Colon polyps Brother    Cancer Father    Breast cancer Cousin    Breast cancer Cousin     Social History   Tobacco Use   Smoking status: Former    Current packs/day: 0.00    Average packs/day: 0.3 packs/day for 8.0 years (2.0 ttl pk-yrs)    Types: Cigarettes    Start date: 01/19/1982    Quit date: 01/19/1990    Years since quitting: 32.8   Smokeless tobacco: Never  Substance Use Topics   Alcohol use: Yes    Alcohol/week: 0.0 standard drinks of alcohol    Comment: occasionally     Current Outpatient Medications:    ASPIRIN 81 PO, Take 81 mg by mouth daily as needed., Disp: , Rfl:    benazepril (LOTENSIN) 10 MG tablet, Take 1 tablet (10 mg total) by mouth daily., Disp: 90 tablet, Rfl: 1   Blood Glucose Monitoring Suppl (CONTOUR NEXT EZ) w/Device KIT, USE TO CHECK BLOOD SUGAR TWICE  DAILY AS DIRECTED, Disp: 1 kit, Rfl: 0   glucose blood (CONTOUR NEXT TEST) test strip, USE 1 EVERY DAY BEFORE BREAKFAST. USE AS DIRECTED, Disp: 100 strip, Rfl: 1    levothyroxine (SYNTHROID) 75 MCG tablet, Take 1 tablet (75 mcg total) by mouth daily before breakfast. Skip Sundays, Disp: 90 tablet, Rfl: 0   Multiple Vitamin (MULTIVITAMIN) capsule, Take 1 capsule by mouth daily., Disp: , Rfl:    rosuvastatin (CRESTOR) 40 MG tablet, Take 1 tablet (40 mg total) by mouth daily., Disp: 90 tablet, Rfl: 1   Semaglutide, 2 MG/DOSE, 8 MG/3ML SOPN, Inject 2 mg as directed once a week., Disp: 9 mL, Rfl: 1   Vitamin D, Ergocalciferol, (DRISDOL) 1.25 MG (50000 UNIT) CAPS capsule, Take 1 capsule (50,000 Units total) by mouth every 14 (fourteen) days., Disp: 6  capsule, Rfl: 1   fluconazole (DIFLUCAN) 150 MG tablet, Take 1 tablet (150 mg total) by mouth every 3 (three) days as needed (for vaginal itching/yeast infection sx). (Patient not taking: Reported on 11/04/2022), Disp: 2 tablet, Rfl: 0  Allergies  Allergen Reactions   Farxiga [Dapagliflozin] Other (See Comments)    Dysuria and recurrent yeast infection     I personally reviewed active problem list, medication list, allergies, family history, social history, health maintenance with the patient/caregiver today.   ROS  Constitutional: Negative for fever or significant weight change.  Respiratory: Negative for cough and shortness of breath.   Cardiovascular: Negative for chest pain or palpitations.  Gastrointestinal: Negative for abdominal pain, no bowel changes.  Musculoskeletal: Negative for gait problem or joint swelling.  Skin: Negative for rash.  Neurological: Negative for dizziness or headache.  No other specific complaints in a complete review of systems (except as listed in HPI above).   Objective  Vitals:   11/04/22 0747  BP: 118/70  Pulse: 90  Resp: 16  SpO2: 98%  Weight: 233 lb (105.7 kg)  Height: 5\' 7"  (1.702 m)    Body mass index is 36.49 kg/m.  Physical Exam  Constitutional: Patient appears well-developed and well-nourished. Obese  No distress.  HEENT: head atraumatic, normocephalic,  pupils equal and reactive to light, neck supple Cardiovascular: Normal rate, regular rhythm and normal heart sounds.  No murmur heard. No BLE edema. Pulmonary/Chest: Effort normal and breath sounds normal. No respiratory distress. Abdominal: Soft.  There is no tenderness. Psychiatric: Patient has a normal mood and affect. behavior is normal. Judgment and thought content normal.   Recent Results (from the past 2160 hour(s))  POCT HgB A1C     Status: Abnormal   Collection Time: 11/04/22  7:49 AM  Result Value Ref Range   Hemoglobin A1C 5.7 (A) 4.0 - 5.6 %   HbA1c POC (<> result, manual entry)     HbA1c, POC (prediabetic range)     HbA1c, POC (controlled diabetic range)       PHQ2/9:    11/04/2022    7:48 AM 06/09/2022    3:59 PM 03/26/2022    1:07 PM 02/23/2022    7:57 AM 01/22/2022   11:20 AM  Depression screen PHQ 2/9  Decreased Interest 0 0 0 0 0  Down, Depressed, Hopeless 0 0 0 0 0  PHQ - 2 Score 0 0 0 0 0  Altered sleeping 0 0  1 0  Tired, decreased energy 1 0  1 0  Change in appetite 0 0  0 0  Feeling bad or failure about yourself  0 0  0 0  Trouble concentrating 0 0  0 0  Moving slowly or fidgety/restless 0 0  0 0  Suicidal thoughts 0 0  0 0  PHQ-9 Score 1 0  2 0    phq 9 is negative   Fall Risk:    11/04/2022    7:48 AM 06/09/2022    3:59 PM 03/26/2022    1:07 PM 02/23/2022    7:57 AM 01/22/2022   11:20 AM  Fall Risk   Falls in the past year? 0 0 0 0 0  Number falls in past yr: 0  0 0   Injury with Fall? 0  0 0   Risk for fall due to : No Fall Risks No Fall Risks  No Fall Risks No Fall Risks  Follow up Falls prevention discussed Falls prevention discussed  Falls prevention  discussed Falls prevention discussed;Education provided;Falls evaluation completed      Functional Status Survey: Is the patient deaf or have difficulty hearing?: No Does the patient have difficulty seeing, even when wearing glasses/contacts?: No Does the patient have difficulty concentrating,  remembering, or making decisions?: No Does the patient have difficulty walking or climbing stairs?: No Does the patient have difficulty dressing or bathing?: No Does the patient have difficulty doing errands alone such as visiting a doctor's office or shopping?: No    Assessment & Plan  1. Type 2 diabetes mellitus with microalbuminuria, with long-term current use of insulin (HCC)  - POCT HgB A1C - Semaglutide, 2 MG/DOSE, 8 MG/3ML SOPN; Inject 2 mg as directed once a week.  Dispense: 9 mL; Refill: 1 - Hemoglobin A1c - Urine Microalbumin w/creat. ratio  2. 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HCC)  Under the care of hematologist   3. Need for immunization against influenza  - Flu vaccine trivalent PF, 6mos and older(Flulaval,Afluria,Fluarix,Fluzone)  4. Dyslipidemia due to type 2 diabetes mellitus (HCC)  - rosuvastatin (CRESTOR) 40 MG tablet; Take 1 tablet (40 mg total) by mouth daily.  Dispense: 90 tablet; Refill: 1 - Lipid panel - Comprehensive Metabolic Panel (CMET)  5. Adult hypothyroidism  - levothyroxine (SYNTHROID) 75 MCG tablet; Take 1 tablet (75 mcg total) by mouth daily before breakfast. Skip Sundays  Dispense: 90 tablet; Refill: 1 - TSH  6. Benign hypertension  - benazepril (LOTENSIN) 10 MG tablet; Take 1 tablet (10 mg total) by mouth daily.  Dispense: 90 tablet; Refill: 1  7. Vitamin D deficiency  - VITAMIN D 25 Hydroxy (Vit-D Deficiency, Fractures)  8. B12 deficiency  - B12 and Folate Panel  9. History of parathyroidectomy  Doing well  10. Osteopenia of lumbar spine

## 2022-11-04 ENCOUNTER — Inpatient Hospital Stay: Payer: Managed Care, Other (non HMO) | Attending: Oncology

## 2022-11-04 ENCOUNTER — Ambulatory Visit: Payer: Managed Care, Other (non HMO) | Admitting: Family Medicine

## 2022-11-04 ENCOUNTER — Encounter: Payer: Self-pay | Admitting: Family Medicine

## 2022-11-04 VITALS — BP 118/70 | HR 90 | Resp 16 | Ht 67.0 in | Wt 233.0 lb

## 2022-11-04 DIAGNOSIS — Z23 Encounter for immunization: Secondary | ICD-10-CM | POA: Diagnosis not present

## 2022-11-04 DIAGNOSIS — I10 Essential (primary) hypertension: Secondary | ICD-10-CM

## 2022-11-04 DIAGNOSIS — E1169 Type 2 diabetes mellitus with other specified complication: Secondary | ICD-10-CM | POA: Diagnosis not present

## 2022-11-04 DIAGNOSIS — E039 Hypothyroidism, unspecified: Secondary | ICD-10-CM

## 2022-11-04 DIAGNOSIS — E785 Hyperlipidemia, unspecified: Secondary | ICD-10-CM

## 2022-11-04 DIAGNOSIS — Z794 Long term (current) use of insulin: Secondary | ICD-10-CM | POA: Diagnosis not present

## 2022-11-04 DIAGNOSIS — E559 Vitamin D deficiency, unspecified: Secondary | ICD-10-CM

## 2022-11-04 DIAGNOSIS — E1129 Type 2 diabetes mellitus with other diabetic kidney complication: Secondary | ICD-10-CM | POA: Diagnosis not present

## 2022-11-04 DIAGNOSIS — R809 Proteinuria, unspecified: Secondary | ICD-10-CM

## 2022-11-04 DIAGNOSIS — E538 Deficiency of other specified B group vitamins: Secondary | ICD-10-CM

## 2022-11-04 DIAGNOSIS — Z9889 Other specified postprocedural states: Secondary | ICD-10-CM

## 2022-11-04 DIAGNOSIS — M8588 Other specified disorders of bone density and structure, other site: Secondary | ICD-10-CM

## 2022-11-04 LAB — POCT GLYCOSYLATED HEMOGLOBIN (HGB A1C): Hemoglobin A1C: 5.7 % — AB (ref 4.0–5.6)

## 2022-11-04 MED ORDER — SEMAGLUTIDE (2 MG/DOSE) 8 MG/3ML ~~LOC~~ SOPN: 2.0000 mg | PEN_INJECTOR | SUBCUTANEOUS | 1 refills | Status: DC

## 2022-11-04 MED ORDER — BENAZEPRIL HCL 10 MG PO TABS: 10.0000 mg | ORAL_TABLET | Freq: Every day | ORAL | 1 refills | Status: DC

## 2022-11-04 MED ORDER — LEVOTHYROXINE SODIUM 75 MCG PO TABS
75.0000 ug | ORAL_TABLET | Freq: Every day | ORAL | 1 refills | Status: DC
Start: 2022-11-04 — End: 2023-03-02

## 2022-11-04 MED ORDER — ROSUVASTATIN CALCIUM 40 MG PO TABS: 40.0000 mg | ORAL_TABLET | Freq: Every day | ORAL | 1 refills | Status: DC

## 2022-11-04 NOTE — Patient Instructions (Signed)

## 2022-11-13 ENCOUNTER — Telehealth: Payer: Self-pay

## 2022-11-13 NOTE — Telephone Encounter (Signed)
[facility] form mailed to patient.

## 2022-11-24 ENCOUNTER — Other Ambulatory Visit: Payer: Self-pay | Admitting: Family Medicine

## 2022-11-24 DIAGNOSIS — E559 Vitamin D deficiency, unspecified: Secondary | ICD-10-CM

## 2022-12-02 ENCOUNTER — Other Ambulatory Visit: Payer: Self-pay | Admitting: Family Medicine

## 2022-12-02 DIAGNOSIS — Z1231 Encounter for screening mammogram for malignant neoplasm of breast: Secondary | ICD-10-CM

## 2022-12-21 ENCOUNTER — Encounter: Payer: Self-pay | Admitting: Family Medicine

## 2022-12-30 ENCOUNTER — Ambulatory Visit
Admission: RE | Admit: 2022-12-30 | Discharge: 2022-12-30 | Disposition: A | Discharge status: Home / Self Care | Payer: Managed Care, Other (non HMO) | Source: Ambulatory Visit | Attending: Family Medicine | Admitting: Family Medicine

## 2022-12-30 DIAGNOSIS — Z1231 Encounter for screening mammogram for malignant neoplasm of breast: Secondary | ICD-10-CM | POA: Insufficient documentation

## 2023-01-21 ENCOUNTER — Telehealth: Payer: Self-pay | Admitting: *Deleted

## 2023-01-21 NOTE — Telephone Encounter (Signed)
 AVS printed for patient for future phlebotomy apt dates.

## 2023-01-25 ENCOUNTER — Other Ambulatory Visit: Payer: Self-pay | Admitting: Physician Assistant

## 2023-01-25 ENCOUNTER — Ambulatory Visit
Admission: RE | Admit: 2023-01-25 | Discharge: 2023-01-25 | Disposition: A | Discharge status: Home / Self Care | Payer: Managed Care, Other (non HMO) | Source: Ambulatory Visit | Attending: Physician Assistant | Admitting: Physician Assistant

## 2023-01-25 DIAGNOSIS — R600 Localized edema: Secondary | ICD-10-CM

## 2023-01-25 DIAGNOSIS — M722 Plantar fascial fibromatosis: Secondary | ICD-10-CM | POA: Insufficient documentation

## 2023-02-05 ENCOUNTER — Encounter: Payer: Self-pay | Admitting: Oncology

## 2023-02-08 ENCOUNTER — Inpatient Hospital Stay: Payer: Managed Care, Other (non HMO)

## 2023-02-22 ENCOUNTER — Other Ambulatory Visit: Payer: Self-pay | Admitting: Family Medicine

## 2023-02-22 ENCOUNTER — Ambulatory Visit (INDEPENDENT_AMBULATORY_CARE_PROVIDER_SITE_OTHER): Payer: Managed Care, Other (non HMO) | Admitting: Advanced Practice Midwife

## 2023-02-22 VITALS — BP 133/80 | HR 88 | Ht 67.0 in | Wt 228.4 lb

## 2023-02-22 DIAGNOSIS — Z794 Long term (current) use of insulin: Secondary | ICD-10-CM

## 2023-02-22 DIAGNOSIS — Z09 Encounter for follow-up examination after completed treatment for conditions other than malignant neoplasm: Secondary | ICD-10-CM

## 2023-02-22 DIAGNOSIS — Z124 Encounter for screening for malignant neoplasm of cervix: Secondary | ICD-10-CM

## 2023-02-22 NOTE — Progress Notes (Addendum)
 Patient ID: Tabitha Shaw, female   DOB: 1960-05-19, 63 y.o.   MRN: [id number]  Reason for Consult: Gynecologic Exam   Referred by Alba Cory, MD  Subjective:  HPI:  Tabitha Shaw is a 63 y.o. female being seen for follow up PAP smear. Recent PAP smear history: 2021: ASCUS +HR HPV (negative 16/18/45) Pathology: A: minute fragments of squamous metaplasia. B: LGSIL CIN1. C: benign ectocervical tissue with focal chronic inflammation- no transformation zone 2022: NILM HPV not tested 2023: NILM +HR HPV not differentiated- (negative 16/18/45) 2024: NILM (vaginal specimen) HPV not tested (provider unable to see cervix)  She has a history of hysterectomy. Chart review- appears to have been done in 2007- surgical report not available in chart. Patient thinks she has a cervix still.  Will do PAP today with HPV testing.  Past Medical History:  Diagnosis Date   Acquired acanthosis nigricans    Diabetes type 2, controlled (HCC)    if controlled was not specified   Hemochromatosis    Hepatomegaly    HLD (hyperlipidemia)    HTN (hypertension)    Hyperlipidemia    Hypertension    Hypertrophy of kidney    Hypothyroidism    Intertrigo    Obesity    Osteopenia    Postinflammatory hyperpigmentation    Snoring    Thyroid disease    Vitamin D deficiency    Family History  Problem Relation Age of Onset   Colon polyps Mother    Hemachromatosis Mother    Hypertension Mother    Depression Sister    Hypertension Brother    Colon polyps Brother    Cancer Father    Breast cancer Cousin    Breast cancer Cousin    Past Surgical History:  Procedure Laterality Date   ABDOMINAL HYSTERECTOMY  2005   blood transfusion yearly     BREAST REDUCTION SURGERY     CESAREAN SECTION     CESAREAN SECTION  1992   CHOLECYSTECTOMY  2000   COLONOSCOPY WITH PROPOFOL N/A 11/11/2018   Procedure: COLONOSCOPY WITH PROPOFOL;  Surgeon: Pasty Spillers, MD;  Location: [facility];   Service: Endoscopy;  Laterality: N/A;   COLONOSCOPY WITH PROPOFOL N/A 02/16/2020   Procedure: COLONOSCOPY WITH PROPOFOL;  Surgeon: Pasty Spillers, MD;  Location: [facility] ENDOSCOPY;  Service: Endoscopy;  Laterality: N/A;   lapcholecystectomy     PARATHYROIDECTOMY Right 11/26/2017   right lower , Dr[facility] Selena [facility]atten [facility]   REDUCTION MAMMAPLASTY Bilateral 1985   VESICOVAGINAL FISTULA CLOSURE W/ TAH      Short Social History:  Social History   Tobacco Use   Smoking status: Former    Current packs/day: 0.00    Average packs/day: 0.3 packs/day for 8.0 years (2.0 ttl pk-yrs)    Types: Cigarettes    Start date: 01/19/1982    Quit date: 01/19/1990    Years since quitting: 33.1   Smokeless tobacco: Never  Substance Use Topics   Alcohol use: Yes    Alcohol/week: 0.0 standard drinks of alcohol    Comment: occasionally    Allergies  Allergen Reactions   Farxiga [Dapagliflozin] Other (See Comments)    Dysuria and recurrent yeast infection     Current Outpatient Medications  Medication Sig Dispense Refill   ASPIRIN 81 PO Take 81 mg by mouth daily as needed.     benazepril (LOTENSIN) 10 MG tablet Take 1 tablet (10 mg total) by mouth daily. 90 tablet 1   Blood Glucose  Monitoring Suppl (CONTOUR NEXT EZ) w/Device KIT USE TO CHECK BLOOD SUGAR TWICE  DAILY AS DIRECTED 1 kit 0   levothyroxine (SYNTHROID) 75 MCG tablet Take 1 tablet (75 mcg total) by mouth daily before breakfast. Skip Sundays 90 tablet 1   Multiple Vitamin (MULTIVITAMIN) capsule Take 1 capsule by mouth daily.     rosuvastatin (CRESTOR) 40 MG tablet Take 1 tablet (40 mg total) by mouth daily. 90 tablet 1   Semaglutide, 2 MG/DOSE, 8 MG/3ML SOPN Inject 2 mg as directed once a week. 9 mL 1   Vitamin D, Ergocalciferol, (DRISDOL) 1.25 MG (50000 UNIT) CAPS capsule Take 1 capsule (50,000 Units total) by mouth every 14 (fourteen) days. 6 capsule 1   CONTOUR NEXT TEST test strip USE 1 STRIP EVERY DAY BEFORE BREAKFAST AS DIRECTED 100 strip 1   No  current facility-administered medications for this visit.    Review of Systems  Constitutional:  Negative for chills and fever.  HENT:  Negative for congestion, ear discharge, ear pain, hearing loss, sinus pain and sore throat.   Eyes:  Negative for blurred vision and double vision.  Respiratory:  Negative for cough, shortness of breath and wheezing.   Cardiovascular:  Negative for chest pain, palpitations and leg swelling.  Gastrointestinal:  Negative for abdominal pain, blood in stool, constipation, diarrhea, heartburn, melena, nausea and vomiting.  Genitourinary:  Negative for dysuria, flank pain, frequency, hematuria and urgency.  Musculoskeletal:  Negative for back pain, joint pain and myalgias.  Skin:  Negative for itching and rash.  Neurological:  Negative for dizziness, tingling, tremors, sensory change, speech change, focal weakness, seizures, loss of consciousness, weakness and headaches.  Endo/Heme/Allergies:  Negative for environmental allergies. Does not bruise/bleed easily.  Psychiatric/Behavioral:  Negative for depression, hallucinations, memory loss, substance abuse and suicidal ideas. The patient is not nervous/anxious and does not have insomnia.         Objective:  Objective   Vitals:   02/22/23 0907 02/22/23 0956  BP: (!) 136/103 133/80  Pulse: 83 88  Weight: 228 lb 6.4 oz (103.6 kg)   Height: 5\' 7" (1.702 m)    Body mass index is 35.77 kg/m. Constitutional: Well nourished, well developed female in no acute distress.  HEENT: normal Skin: Warm and dry.  Cardiovascular: Regular rate and rhythm.   Respiratory:  Normal respiratory effort Psych: Alert and Oriented x3. No memory deficits. Normal mood and affect.    Pelvic exam:  is not limited by body habitus EGBUS: within normal limits Vagina: within normal limits and with normal mucosa  Cervix: appears to be cervical remnant of surgical resection. PAP smear specimen collected   Assessment/Plan:     63   y.o. G1 P1 female, cervical cancer screening  PAP ordered with HPV co-testing Follow up after lab results   Tresea Mall, CNM [facility] 02/22/2023 1:10 PM

## 2023-02-25 LAB — IGP, APTIMA HPV: HPV Aptima: POSITIVE — AB

## 2023-02-26 ENCOUNTER — Ambulatory Visit: Payer: Managed Care, Other (non HMO) | Admitting: Family Medicine

## 2023-02-26 ENCOUNTER — Encounter: Payer: Self-pay | Admitting: Family Medicine

## 2023-02-26 VITALS — BP 126/82 | HR 97 | Temp 98.3°F | Resp 16 | Ht 67.0 in | Wt 229.2 lb

## 2023-02-26 DIAGNOSIS — Z0001 Encounter for general adult medical examination with abnormal findings: Secondary | ICD-10-CM | POA: Diagnosis not present

## 2023-02-26 DIAGNOSIS — E039 Hypothyroidism, unspecified: Secondary | ICD-10-CM

## 2023-02-26 DIAGNOSIS — F4321 Adjustment disorder with depressed mood: Secondary | ICD-10-CM | POA: Diagnosis not present

## 2023-02-26 DIAGNOSIS — Z0000 Encounter for general adult medical examination without abnormal findings: Secondary | ICD-10-CM

## 2023-02-26 LAB — COMPREHENSIVE METABOLIC PANEL
ALT: 27 [IU]/L (ref 0–32)
AST: 28 [IU]/L (ref 0–40)
Albumin: 4.5 g/dL (ref 3.8–4.9)
Alkaline Phosphatase: 64 [IU]/L (ref 44–121)
BUN/Creatinine Ratio: 13 (ref 12–28)
BUN: 10 mg/dL (ref 8–27)
Bilirubin Total: 0.8 mg/dL (ref 0.0–1.2)
CO2: 24 mmol/L (ref 20–29)
Calcium: 9.5 mg/dL (ref 8.7–10.3)
Chloride: 103 mmol/L (ref 96–106)
Creatinine, Ser: 0.76 mg/dL (ref 0.57–1.00)
Globulin, Total: 2.7 g/dL (ref 1.5–4.5)
Glucose: 107 mg/dL — ABNORMAL HIGH (ref 70–99)
Potassium: 4.5 mmol/L (ref 3.5–5.2)
Sodium: 140 mmol/L (ref 134–144)
Total Protein: 7.2 g/dL (ref 6.0–8.5)
eGFR: 90 mL/min/{1.73_m2} (ref >59)

## 2023-02-26 LAB — LIPID PANEL
Chol/HDL Ratio: 3 {ratio} (ref 0.0–4.4)
Cholesterol, Total: 131 mg/dL (ref 100–199)
HDL: 44 mg/dL (ref >39)
LDL Chol Calc (NIH): 60 mg/dL (ref 0–99)
Triglycerides: 156 mg/dL — ABNORMAL HIGH (ref 0–149)
VLDL Cholesterol Cal: 27 mg/dL (ref 5–40)

## 2023-02-26 LAB — HEMOGLOBIN A1C
Est. average glucose Bld gHb Est-mCnc: 131 mg/dL
Hgb A1c MFr Bld: 6.2 % — ABNORMAL HIGH (ref 4.8–5.6)

## 2023-02-26 LAB — MICROALBUMIN / CREATININE URINE RATIO
Creatinine, Urine: 133.1 mg/dL
Microalb/Creat Ratio: 43 mg/g{creat} — ABNORMAL HIGH (ref 0–29)
Microalbumin, Urine: 56.7 ug/mL

## 2023-02-26 LAB — VITAMIN D 25 HYDROXY (VIT D DEFICIENCY, FRACTURES): Vit D, 25-Hydroxy: 52.8 ng/mL (ref 30.0–100.0)

## 2023-02-26 LAB — B12 AND FOLATE PANEL
Folate: 10 ng/mL (ref >3.0)
Vitamin B-12: 339 pg/mL (ref 232–1245)

## 2023-02-26 LAB — TSH: TSH: 0.321 u[IU]/mL — ABNORMAL LOW (ref 0.450–4.500)

## 2023-02-26 NOTE — Progress Notes (Signed)
 Name: Tabitha Shaw   MRN: [id number]    DOB: 1960-11-13   Date:02/26/2023       Progress Note  Subjective  Chief Complaint  Chief Complaint  Patient presents with   Annual Exam    HPI  Patient presents for annual CPE.  Diet: since she lost her husband she has been eating more sandwiches, but also soup and salads  Exercise: up to date   Last Eye Exam: completed Last Dental Exam: completed  Flowsheet Row Office Visit from 02/26/2023 in [facility]  AUDIT-C Score 1       Depression: Phq 9 is  positive    02/26/2023    7:45 AM 11/04/2022    7:48 AM 06/09/2022    3:59 PM 03/26/2022    1:07 PM 02/23/2022    7:57 AM  Depression screen PHQ 2/9  Decreased Interest 2 0 0 0 0  Down, Depressed, Hopeless 2 0 0 0 0  PHQ - 2 Score 4 0 0 0 0  Altered sleeping 2 0 0  1  Tired, decreased energy 2 1 0  1  Change in appetite 0 0 0  0  Feeling bad or failure about yourself  0 0 0  0  Trouble concentrating 2 0 0  0  Moving slowly or fidgety/restless 0 0 0  0  Suicidal thoughts 0 0 0  0  PHQ-9 Score 10 1 0  2  Difficult doing work/chores Somewhat difficult       Hypertension: BP Readings from Last 3 Encounters:  02/26/23 126/82  02/22/23 133/80  11/04/22 107/88   Obesity: Wt Readings from Last 3 Encounters:  02/26/23 229 lb 3.2 oz (104 kg)  02/22/23 228 lb 6.4 oz (103.6 kg)  11/04/22 233 lb (105.7 kg)   BMI Readings from Last 3 Encounters:  02/26/23 35.90 kg/m  02/22/23 35.77 kg/m  11/04/22 36.49 kg/m     Vaccines: reviewed with the patient.   Hep C Screening: completed STD testing and prevention (HIV/chl/gon/syphilis): N/A Intimate partner violence: negative screen  Sexual History : not currently, widow since January 2025 Menstrual History/LMP/Abnormal Bleeding:  Discussed importance of follow up if any post-menopausal bleeding: yes  Incontinence Symptoms: negative for symptoms   Breast cancer:  - Last Mammogram: up to date  - BRCA gene  screening: N/A  Osteoporosis Prevention : Discussed high calcium  and vitamin D  supplementation, weight bearing exercises Bone density :yes   Cervical cancer screening: up-to-date - recent pap LGIL positive HPV  Skin cancer: Discussed monitoring for atypical lesions  Colorectal cancer: up to date  - repeat in 2027 Lung cancer:  Low Dose CT Chest recommended if Age 44-80 years, 20 pack-year currently smoking OR have quit w/in 15years. Patient does not qualify for screen   ECG: 2011 repeat next visit   Advanced Care Planning: A voluntary discussion about advance care planning including the explanation and discussion of advance directives.  Discussed health care proxy and Living will, and the patient was able to identify a health care proxy as Tabitha Shaw - daughter.  Patient does have a living will and power of attorney of health care   Patient Active Problem List   Diagnosis Date Noted   Plantar fasciitis of left foot 01/25/2023   Obesity (BMI 30.0-34.9) 09/03/2021   History of parathyroidectomy 08/06/2021   S/P laparoscopic supracervical hysterectomy 04/23/2020   Dysplasia of cervix, low grade (CIN 1) 11/22/2019   History of colonic polyps  Polyp of colon    Cervical high risk HPV (human papillomavirus) test positive 09/03/2016   B12 deficiency 09/04/2015   Elevated PTHrP level 09/04/2015   Benign hypertension 09/12/2014   Acanthosis nigricans 09/09/2014   Diabetes mellitus with renal manifestation (HCC) 09/09/2014   Dyslipidemia due to type 2 diabetes mellitus (HCC) 09/09/2014   Abnormal electrocardiogram 09/09/2014   Enlarged kidney 09/09/2014   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HCC) 09/09/2014   Enlarged liver 09/09/2014   Hypercalcemia 09/09/2014   Adult hypothyroidism 09/09/2014   Microalbuminuria 09/09/2014   Spinal stenosis of lumbar region with neurogenic claudication 09/09/2014   Osteopenia 09/09/2014   Vitamin D  deficiency 09/09/2014    Past Surgical History:  Procedure  Laterality Date   ABDOMINAL HYSTERECTOMY  2005   blood transfusion yearly     BREAST REDUCTION SURGERY     CESAREAN SECTION     CESAREAN SECTION  1992   CHOLECYSTECTOMY  2000   COLONOSCOPY WITH PROPOFOL  N/A 11/11/2018   Procedure: COLONOSCOPY WITH PROPOFOL ;  Surgeon: Janalyn Keene NOVAK, MD;  Location: [facility] END[facility]COPY;  Service: Endoscopy;  Laterality: N/A;   COLONOSCOPY WITH PROPOFOL  N/A 02/16/2020   Procedure: COLONOSCOPY WITH PROPOFOL ;  Surgeon: Janalyn Keene NOVAK, MD;  Location: [facility] END[facility];  Service: Endoscopy;  Laterality: N/A;   COSMETIC SURGERY  1989   Breast reduction   lapcholecystectomy     PARATHYROIDECTOMY Right 11/26/2017   right lower , Dr. Luke at [facility]   REDUCTION MAMMAPLASTY Bilateral 1985   VESICOVAGINAL FISTULA CLOSURE W/ TAH      Family History  Problem Relation Age of Onset   Colon polyps Mother    Hemachromatosis Mother    Hypertension Mother    Depression Sister    Hypertension Brother    Colon polyps Brother    Cancer Father    Breast cancer Cousin    Breast cancer Cousin    Hypertension Sister     Social History   Socioeconomic History   Marital status: Widowed    Spouse name: Tabitha Shaw    Number of children: 1   Years of education: Not on file   Highest education level: 12th grade  Occupational History   Occupation: customer services     Comment: [employer]   Tobacco Use   Smoking status: Former    Current packs/day: 0.00    Average packs/day: 0.3 packs/day for 10.0 years (2.5 ttl pk-yrs)    Types: Cigarettes    Start date: 01/19/1982    Quit date: 01/19/1990    Years since quitting: 33.1   Smokeless tobacco: Never  Vaping Use   Vaping status: Never Used  Substance and Sexual Activity   Alcohol use: Yes    Comment: occasionally   Drug use: No   Sexual activity: Not Currently    Partners: Male    Birth control/protection: None  Other Topics Concern   Not on file  Social History Narrative   Married; works  full time   Her daughter,  Tabitha Shaw is a travel engineer, civil (consulting)    Social Drivers of Corporate Investment Banker Strain: Low Risk  (02/22/2023)   Overall Financial Resource Strain (CARDIA)    Difficulty of Paying Living Expenses: Not hard at all  Food Insecurity: No Food Insecurity (02/22/2023)   Hunger Vital Sign    Worried About Running Out of Food in the Last Year: Never true    Ran Out of Food in the Last Year: Never true  Transportation Needs: No Transportation Needs (02/22/2023)   PRAPARE -  Administrator, Civil Service (Medical): No    Lack of Transportation (Non-Medical): No  Physical Activity: Sufficiently Active (02/22/2023)   Exercise Vital Sign    Days of Exercise per Week: 3 days    Minutes of Exercise per Session: 60 min  Stress: Stress Concern Present (02/22/2023)   [employer] of Occupational Health - Occupational Stress Questionnaire    Feeling of Stress : To some extent  Social Connections: Moderately Isolated (02/22/2023)   Social Connection and Isolation Panel [NHANES]    Frequency of Communication with Friends and Family: More than three times a week    Frequency of Social Gatherings with Friends and Family: Twice a week    Attends Religious Services: More than 4 times per year    Active Member of [employer] or Organizations: No    Attends Banker Meetings: Not on file    Marital Status: Widowed  Intimate Partner Violence: Not At Risk (02/23/2022)   Humiliation, Afraid, Rape, and Kick questionnaire    Fear of Current or Ex-Partner: No    Emotionally Abused: No    Physically Abused: No    Sexually Abused: No     Current Outpatient Medications:    ASPIRIN 81 PO, Take 81 mg by mouth daily as needed., Disp: , Rfl:    benazepril  (LOTENSIN ) 10 MG tablet, Take 1 tablet (10 mg total) by mouth daily., Disp: 90 tablet, Rfl: 1   Blood Glucose Monitoring Suppl (CONTOUR NEXT EZ) w/Device KIT, USE TO CHECK BLOOD SUGAR TWICE  DAILY AS DIRECTED, Disp: 1 kit, Rfl: 0   CONTOUR NEXT TEST test strip,  USE 1 STRIP EVERY DAY BEFORE BREAKFAST AS DIRECTED, Disp: 100 strip, Rfl: 1   levothyroxine  (SYNTHROID ) 75 MCG tablet, Take 1 tablet (75 mcg total) by mouth daily before breakfast. Skip Sundays, Disp: 90 tablet, Rfl: 1   Multiple Vitamin (MULTIVITAMIN) capsule, Take 1 capsule by mouth daily., Disp: , Rfl:    rosuvastatin  (CRESTOR ) 40 MG tablet, Take 1 tablet (40 mg total) by mouth daily., Disp: 90 tablet, Rfl: 1   Semaglutide , 2 MG/DOSE, 8 MG/3ML SOPN, Inject 2 mg as directed once a week., Disp: 9 mL, Rfl: 1   Vitamin D , Ergocalciferol , (DRISDOL ) 1.25 MG (50000 UNIT) CAPS capsule, Take 1 capsule (50,000 Units total) by mouth every 14 (fourteen) days., Disp: 6 capsule, Rfl: 1  Allergies  Allergen Reactions   Farxiga  [Dapagliflozin ] Other (See Comments)    Dysuria and recurrent yeast infection      ROS  Constitutional: Negative for fever or weight change.  Respiratory: Negative for cough and shortness of breath.   Cardiovascular: Negative for chest pain or palpitations.  Gastrointestinal: Negative for abdominal pain, no bowel changes.  Musculoskeletal: Negative for gait problem or joint swelling.  Skin: Negative for rash.  Neurological: Negative for dizziness or headache.  No other specific complaints in a complete review of systems (except as listed in HPI above).   Objective  Vitals:   02/26/23 0752  BP: 126/82  Pulse: 97  Resp: 16  Temp: 98.3 F (36.8 C)  TempSrc: Oral  SpO2: 99%  Weight: 229 lb 3.2 oz (104 kg)  Height: 5' 7 (1.702 m)    Body mass index is 35.9 kg/m.  Physical Exam  Constitutional: Patient appears well-developed and well-nourished. No distress.  HENT: Head: Normocephalic and atraumatic. Ears: B TMs ok, no erythema or effusion; Nose: Nose normal. Mouth/Throat: Oropharynx is clear and moist. No oropharyngeal exudate.  Eyes: Conjunctivae  and EOM are normal. Pupils are equal, round, and reactive to light. No scleral icterus.  Neck: Normal range of  motion. Neck supple. No JVD present. No thyromegaly present.  Cardiovascular: Normal rate, regular rhythm and normal heart sounds.  No murmur heard. No BLE edema. Pulmonary/Chest: Effort normal and breath sounds normal. No respiratory distress. Abdominal: Soft. Bowel sounds are normal, no distension. There is no tenderness. no masses Breast: no lumps or masses, no nipple discharge or rashes FEMALE GENITALIA:  Not done  RECTAL: not done  Musculoskeletal: Normal range of motion, no joint effusions. No gross deformities Neurological: he is alert and oriented to person, place, and time. No cranial nerve deficit. Coordination, balance, strength, speech and gait are normal.  Skin: Skin is warm and dry. No rash noted. No erythema.  Psychiatric: Patient has a normal mood and affect. behavior is normal. Judgment and thought content normal.     Assessment & Plan  1. Well adult exam (Primary)   2. Grieving  Recent lost her husband for pancreatic cancer    3. Adult hypothyroidism  Discussed labs, advised to take one pill daily and half on Sundays recheck in 6 weeks   -USPSTF grade A and B recommendations reviewed with patient; age-appropriate recommendations, preventive care, screening tests, etc discussed and encouraged; healthy living encouraged; see AVS for patient education given to patient -Discussed importance of 150 minutes of physical activity weekly, eat two servings of fish weekly, eat one serving of tree nuts ( cashews, pistachios, pecans, almonds.SABRA) every other day, eat 6 servings of fruit/vegetables daily and drink plenty of water and avoid sweet beverages.   -Reviewed Health Maintenance: Yes.

## 2023-03-02 ENCOUNTER — Other Ambulatory Visit: Payer: Self-pay | Admitting: Family Medicine

## 2023-03-02 ENCOUNTER — Encounter: Payer: Self-pay | Admitting: Family Medicine

## 2023-03-02 DIAGNOSIS — E039 Hypothyroidism, unspecified: Secondary | ICD-10-CM

## 2023-03-02 MED ORDER — LEVOTHYROXINE SODIUM 50 MCG PO TABS: 50.0000 ug | ORAL_TABLET | Freq: Every day | ORAL | 1 refills | Status: DC

## 2023-03-03 ENCOUNTER — Other Ambulatory Visit: Payer: Self-pay | Admitting: Family Medicine

## 2023-03-03 DIAGNOSIS — E039 Hypothyroidism, unspecified: Secondary | ICD-10-CM

## 2023-03-14 ENCOUNTER — Encounter: Payer: Self-pay | Admitting: Advanced Practice Midwife

## 2023-03-18 ENCOUNTER — Encounter: Payer: Self-pay | Admitting: Family Medicine

## 2023-04-05 ENCOUNTER — Telehealth: Payer: Self-pay | Admitting: Oncology

## 2023-04-05 NOTE — Telephone Encounter (Signed)
 Patient would like to have labs drawn at [facility]. She request the slip be left at the front desk for her to pick up tomorrow.

## 2023-04-05 NOTE — Progress Notes (Unsigned)
 Referring Provider:  Higinio Plan CNM  HPI:  Tabitha Shaw is a 63 y.o.  G1P1  who presents today for evaluation and management of abnormal cervical cytology.    Prior pap smears:  Date:02/22/23 YNW:GNFA HPV: positive  Date:02/23/22 OZH:YQMV HPV: not tested  Date:04/22/21 Pap Nilm HPV: Positive  Date:04/23/20 Pap Nilm HPV: not tested Date:09/07/18 HQI:ONGE HPV: positive Date:08/31/17 XBM:WUXL KGM:WNUUVOZD Date:08/28/16 GUY:QIHK HPV positive Date:03/30/14 VQQ:VZDGLO  Prior cervical / vaginal findings: colpo Date:10/24/19 Results: CIN1  Symptoms/History:  -Abnormal vaginal discharge: no -Postmenopausal: no -Intermenstrual bleeding: no -Postcoital bleeding: no -Bleeding problems (non-gyn): no -Contraception: no -Number of current sexual partners: no -Number of partners in lifetime: 5 -History of a high risk partner: no -History of STDs: no -Smoking: no      ROS:  {Ros - complete:30496}  OB History  Gravida Para Term Preterm AB Living  1 1    1   SAB IAB Ectopic Multiple Live Births          # Outcome Date GA Lbr Len/2nd Weight Sex Type Anes PTL Lv  1 Para             Obstetric Comments  1st Menstrual Cycle:  15  1st Pregnancy:  30    Past Medical History:  Diagnosis Date   Acquired acanthosis nigricans    Blood transfusion without reported diagnosis 2001   Heavy cycle   Diabetes type 2, controlled (HCC)    if controlled was not specified   Hemochromatosis    Hepatomegaly    HLD (hyperlipidemia)    HTN (hypertension)    Hyperlipidemia    Hypertension    Hypertrophy of kidney    Hypothyroidism    Intertrigo    Obesity    Osteopenia    Postinflammatory hyperpigmentation    Sleep apnea    Snoring    Thyroid disease    Vitamin D deficiency     Past Surgical History:  Procedure Laterality Date   ABDOMINAL HYSTERECTOMY  2005   blood transfusion yearly     BREAST REDUCTION SURGERY     CESAREAN SECTION     CESAREAN SECTION  1992   CHOLECYSTECTOMY  2000    COLONOSCOPY WITH PROPOFOL N/A 11/11/2018   Procedure: COLONOSCOPY WITH PROPOFOL;  Surgeon: Pasty Spillers, MD;  Location: [facility] END[facility];  Service: Endoscopy;  Laterality: N/A;   COLONOSCOPY WITH PROPOFOL N/A 02/16/2020   Procedure: COLONOSCOPY WITH PROPOFOL;  Surgeon: Pasty Spillers, MD;  Location: [facility];  Service: Endoscopy;  Laterality: N/A;   COSMETIC SURGERY  1989   Breast reduction   lapcholecystectomy     PARATHYROIDECTOMY Right 11/26/2017   right lower , Dr. Selena Batten at [facility]   REDUCTION MAMMAPLASTY Bilateral 1985   VESICOVAGINAL FISTULA CLOSURE W/ TAH      SOCIAL HISTORY:  Social History   Substance and Sexual Activity  Alcohol Use Yes   Comment: occasionally    Social History   Substance and Sexual Activity  Drug Use No     Family History  Problem Relation Age of Onset   Colon polyps Mother    Hemachromatosis Mother    Hypertension Mother    Depression Sister    Hypertension Brother    Colon polyps Brother    Cancer Father    Breast cancer Cousin    Breast cancer Cousin    Hypertension Sister     ALLERGIES:  Marcelline Deist [dapagliflozin]  She has a current medication list which includes the following  prescription(s): aspirin, benazepril, contour next ez, contour next test, levothyroxine, multivitamin, rosuvastatin, semaglutide (2 mg/dose), and vitamin d (ergocalciferol).  Physical Exam: -Vitals:  There were no vitals taken for this visit.  PROCEDURE: Colposcopy performed with 4% acetic acid and Lugol's after informed consent obtained.  Physical Exam                            -Aceto-white Lesions Location(s): See above              -Biopsy performed at *** o'clock               -ECC indicated and performed: {yes no:314532}     -Biopsy sites made hemostatic with pressure and Monsel's solution   -Satisfactory colposcopy: {yes no:314532}    -Evidence of Invasive cervical CA :  NO  ASSESSMENT:  Tabitha Shaw is a 63 y.o. G1P1 with  LSIL***ASCUS and HPV-HR positive*** 16/18/45 POS***NEG on recent pap (DATE), here for colposcopy today, performed as above without complications.  -ECC and 3 cervical bx sent to pathology -Aftercare instructions for home reviewed, si/sx of when to call/return discussed. -RTC 2 weeks for results, sooner prn  No orders of the defined types were placed in this encounter.          F/U  No follow-ups on file.   Julieanne Manson, DO [facility]

## 2023-04-06 ENCOUNTER — Encounter: Payer: Managed Care, Other (non HMO) | Admitting: Obstetrics

## 2023-04-06 ENCOUNTER — Encounter: Payer: Self-pay | Admitting: Obstetrics

## 2023-04-06 ENCOUNTER — Ambulatory Visit (INDEPENDENT_AMBULATORY_CARE_PROVIDER_SITE_OTHER): Payer: Managed Care, Other (non HMO) | Admitting: Obstetrics

## 2023-04-06 VITALS — BP 147/89 | HR 81 | Ht 67.0 in | Wt 230.0 lb

## 2023-04-06 DIAGNOSIS — R87612 Low grade squamous intraepithelial lesion on cytologic smear of cervix (LGSIL): Secondary | ICD-10-CM

## 2023-04-06 DIAGNOSIS — B977 Papillomavirus as the cause of diseases classified elsewhere: Secondary | ICD-10-CM

## 2023-04-06 NOTE — Patient Instructions (Signed)
 Colposcopy, Care After  The following information offers guidance on how to care for yourself after your procedure. Your doctor may also give you more specific instructions. If you have problems or questions, contact your doctor. What can I expect after the procedure? If you did not have a sample of your tissue taken out (did not have a biopsy), you may only have some spotting of blood for a few days. You can go back to your normal activities. If you had a sample of your tissue taken out, it is common to have: Soreness and mild pain. These may last for a few days. Mild bleeding or fluid (discharge) coming from your vagina. The fluid will look dark and grainy. You may have this for a few days. The fluid may be caused by a liquid that was used during your procedure. You may need to wear a sanitary pad. Spotting of blood for at least 48 hours after the procedure. Follow these instructions at home: Medicines Take over-the-counter and prescription medicines only as told by your doctor. Ask your doctor what over-the-counter pain medicines and prescription medicines you can start taking again. This is very important if you take blood thinners. Activity For at least 3 days, or for as long as told by your doctor, avoid: Douching. Using tampons. Having sex. Return to your normal activities as told by your doctor. Ask your doctor what activities are safe for you. General instructions Ask your doctor if you may take baths, swim, or use a hot tub. You may take showers. If you use birth control (contraception), keep using it. Keep all follow-up visits. Contact a doctor if: You have a fever or chills. You faint or feel light-headed. Get help right away if: You bleed a lot from your vagina. A lot of bleeding means that the bleeding soaks through a pad in less than 1 hour. You have clumps of blood (blood clots) coming from your vagina. You have signs that could mean you have an infection. This may be  fluid coming from your vagina that is: Different than normal. Yellow. Bad-smelling. You have very bad pain or cramps in your lower belly that do not get better with medicine. Summary If you did not have a sample of your tissue taken out, you may only have some spotting of blood for a few days. You can go back to your normal activities. If you had a sample of your tissue taken out, it is common to have mild pain for a few days and spotting for 48 hours. Avoid douching, using tampons, and having sex for at least 3 days after the procedure or for as long as told. Get help right away if you have a lot of bleeding, very bad pain, or signs of infection. This information is not intended to replace advice given to you by your health care provider. Make sure you discuss any questions you have with your health care provider. Document Revised: 06/02/2020 Document Reviewed: 06/02/2020 Elsevier Patient Education  2024 [employer].

## 2023-04-06 NOTE — Telephone Encounter (Signed)
 [facility] form completed. Will set down stairs for pick up.

## 2023-04-07 ENCOUNTER — Other Ambulatory Visit: Payer: Self-pay | Admitting: Family Medicine

## 2023-04-07 LAB — PROTIME-INR

## 2023-04-08 ENCOUNTER — Encounter: Payer: Self-pay | Admitting: Oncology

## 2023-04-08 ENCOUNTER — Encounter: Payer: Self-pay | Admitting: Family Medicine

## 2023-04-08 ENCOUNTER — Inpatient Hospital Stay: Payer: Managed Care, Other (non HMO) | Attending: Oncology

## 2023-04-08 LAB — TSH: TSH: 0.55 u[IU]/mL (ref 0.450–4.500)

## 2023-04-08 NOTE — Progress Notes (Signed)
Rescheduled, not seen. °

## 2023-04-09 ENCOUNTER — Other Ambulatory Visit: Payer: Self-pay | Admitting: Family Medicine

## 2023-04-09 DIAGNOSIS — E039 Hypothyroidism, unspecified: Secondary | ICD-10-CM

## 2023-04-09 MED ORDER — LEVOTHYROXINE SODIUM 50 MCG PO TABS: 50.0000 ug | ORAL_TABLET | Freq: Every day | ORAL | 1 refills | Status: DC

## 2023-04-12 ENCOUNTER — Ambulatory Visit: Payer: Managed Care, Other (non HMO) | Admitting: Family Medicine

## 2023-04-12 ENCOUNTER — Other Ambulatory Visit: Payer: Self-pay | Admitting: Family Medicine

## 2023-04-12 ENCOUNTER — Encounter: Payer: Self-pay | Admitting: Family Medicine

## 2023-04-12 DIAGNOSIS — R809 Proteinuria, unspecified: Secondary | ICD-10-CM

## 2023-04-12 DIAGNOSIS — M8588 Other specified disorders of bone density and structure, other site: Secondary | ICD-10-CM

## 2023-04-12 DIAGNOSIS — E785 Hyperlipidemia, unspecified: Secondary | ICD-10-CM

## 2023-04-12 DIAGNOSIS — E559 Vitamin D deficiency, unspecified: Secondary | ICD-10-CM | POA: Diagnosis not present

## 2023-04-12 DIAGNOSIS — E039 Hypothyroidism, unspecified: Secondary | ICD-10-CM

## 2023-04-12 DIAGNOSIS — Z9089 Acquired absence of other organs: Secondary | ICD-10-CM

## 2023-04-12 DIAGNOSIS — Z794 Long term (current) use of insulin: Secondary | ICD-10-CM

## 2023-04-12 DIAGNOSIS — I10 Essential (primary) hypertension: Secondary | ICD-10-CM

## 2023-04-12 DIAGNOSIS — E1169 Type 2 diabetes mellitus with other specified complication: Secondary | ICD-10-CM | POA: Diagnosis not present

## 2023-04-12 DIAGNOSIS — E538 Deficiency of other specified B group vitamins: Secondary | ICD-10-CM

## 2023-04-12 DIAGNOSIS — E1129 Type 2 diabetes mellitus with other diabetic kidney complication: Secondary | ICD-10-CM

## 2023-04-12 DIAGNOSIS — Z9889 Other specified postprocedural states: Secondary | ICD-10-CM

## 2023-04-12 MED ORDER — ROSUVASTATIN CALCIUM 40 MG PO TABS: 40.0000 mg | ORAL_TABLET | Freq: Every day | ORAL | 1 refills | Status: DC

## 2023-04-12 MED ORDER — LEVOTHYROXINE SODIUM 50 MCG PO TABS: 50.0000 ug | ORAL_TABLET | Freq: Every day | ORAL | 1 refills | Status: DC

## 2023-04-12 MED ORDER — BENAZEPRIL HCL 10 MG PO TABS: 10.0000 mg | ORAL_TABLET | Freq: Every day | ORAL | 1 refills | Status: DC

## 2023-04-12 MED ORDER — SEMAGLUTIDE (2 MG/DOSE) 8 MG/3ML ~~LOC~~ SOPN
2.0000 mg | PEN_INJECTOR | SUBCUTANEOUS | 1 refills | Status: DC
Start: 2023-04-12 — End: 2023-08-13

## 2023-04-12 NOTE — Progress Notes (Deleted)
 Name: Tabitha Shaw   MRN: [id number]    DOB: 1961-01-18   Date:04/12/2023       Progress Note  Subjective  Chief Complaint  Chief Complaint  Patient presents with  . Medical Management of Chronic Issues    TSH   HPI   Morbid obesity: BMI is again above 35, she is on Ozempic 2  mg dose since May 2024 , weight was low 220 lbs but is trending up and today is up to 233 lbs . She has been physically active and eating healthier , discussed portion control    B12 deficiency: she has been taking SL B12 and doing well    Hypothyroidism: she is taking Synthroid  daily and skipping Sundays and last TSH was finally back to normal  She has chronic dry skin, no change in bowel movements. Recheck levels yearly    DMII : hgbA1C spiked to 8.1% back in August 2020, A1C today is 5.7 % She is compliant  Ozempic  2 mg and she was also on  Farxiga for urine microalbuminuria however she has recurrent UTI 's and vaginitis and since medication stopped vaginitis and uti's resolved.  Denies polyphagia, polydipsia or polyuria, but she has nocturia once per night - stable. She is on statin therapy    HTN: she is down to 10 mg of Lotensin and bp is at goal, no dizziness, chest pain or palpitation . Continue current dose    Hyperlipidemia: she is now on Crestor and LDL was at goal, continue medication, recheck labs    History of Primary  Hyperparathyroidism: seen by Dr. Tedd Sias and referred to Dr. Selena Batten, found to have a right lower parathyroid enlargement and had surgery on 11/26/2017, and has been released since, last Pth was normal and also calcium level , we will recheck labs before her next visit    Hemochromatosis : She has an appointment with Dr. Orlie Dakin this week   Memory difficulties: she is worried because daughter noticed she has been forgetful, there is a family history of Alzheimer's dementia, and her 23&Me test was positive. She saw Dr. Sherryll Burger, she was diagnosed with mild cognitive dysfunction, she  has not been compliant with CPAP. She is also taking baby aspirin as recommended by Dr. Kathi Simpers Sellar Mass: found on MRI brain, had it repeated 02/23    1. 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 Otherwise unremarkable brain MRI. 2. Essentially stable NeuroQuant volumetric analysis of the brain, see details on [employer].   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 Tiburcio Pea, she had repeat pap smear April 2022 that was normal , she had a repeat pap smear done April 2023 that was normal but positive HPV , last Pap smear was normal  but was vaginal wall since I could not see the cervix, advised patient to have an exam done there near year and if I am unable to see the cervix we will refer her back to gyn    OSA: she has the CPAP at home but is not wearing it.  She has purchased and oral device. Unchanged    Vitamin D: she is still taking vitamin D rx but only every other week since last level was elevated    Patient Active Problem List   Diagnosis Date Noted  . Plantar fasciitis of left foot 01/25/2023  . Obesity (BMI 30.0-34.9) 09/03/2021  . History of parathyroidectomy 08/06/2021  .  S/P laparoscopic supracervical hysterectomy 04/23/2020  . Dysplasia of cervix, low grade (CIN 1) 11/22/2019  . History of colonic polyps   . Polyp of colon   . Cervical high risk HPV (human papillomavirus) test positive 09/03/2016  . B12 deficiency 09/04/2015  . Elevated PTHrP level 09/04/2015  . Benign hypertension 09/12/2014  . Acanthosis nigricans 09/09/2014  . Diabetes mellitus with renal manifestation (HCC) 09/09/2014  . Dyslipidemia due to type 2 diabetes mellitus (HCC) 09/09/2014  . Abnormal electrocardiogram 09/09/2014  . Enlarged kidney 09/09/2014  . 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HCC) 09/09/2014  . Enlarged liver 09/09/2014  . Hypercalcemia 09/09/2014  . Adult hypothyroidism 09/09/2014  . Microalbuminuria 09/09/2014  . Spinal stenosis of lumbar  region with neurogenic claudication 09/09/2014  . Osteopenia 09/09/2014  . Vitamin D deficiency 09/09/2014    Past Surgical History:  Procedure Laterality Date  . ABDOMINAL HYSTERECTOMY  2005  . blood transfusion yearly    . BREAST REDUCTION SURGERY    . CESAREAN SECTION    . CESAREAN SECTION  1992  . CHOLECYSTECTOMY  2000  . COLONOSCOPY WITH PROPOFOL N/A 11/11/2018   Procedure: COLONOSCOPY WITH PROPOFOL;  Surgeon: Pasty Spillers, MD;  Location: [facility] ENDOSCOPY;  Service: Endoscopy;  Laterality: N/A;  . COLONOSCOPY WITH PROPOFOL N/A 02/16/2020   Procedure: COLONOSCOPY WITH PROPOFOL;  Surgeon: Pasty Spillers, MD;  Location: [facility] ENDOSCOPY;  Service: Endoscopy;  Laterality: N/A;  . COSMETIC SURGERY  1989   Breast reduction  . lapcholecystectomy    . PARATHYROIDECTOMY Right 11/26/2017   right lower , Dr. Selena Batten at [facility]  . REDUCTION MAMMAPLASTY Bilateral 1985  . VESICOVAGINAL FISTULA CLOSURE W/ TAH      Family History  Problem Relation Age of Onset  . Colon polyps Mother   . Hemachromatosis Mother   . Hypertension Mother   . Depression Sister   . Hypertension Brother   . Colon polyps Brother   . Cancer Father   . Breast cancer Cousin   . Breast cancer Cousin   . Hypertension Sister     Social History   Tobacco Use  . Smoking status: Former    Current packs/day: 0.00    Average packs/day: 0.3 packs/day for 10.0 years (2.5 ttl pk-yrs)    Types: Cigarettes    Start date: 01/19/1982    Quit date: 01/19/1990    Years since quitting: 33.2  . Smokeless tobacco: Never  Substance Use Topics  . Alcohol use: Yes    Comment: occasionally     Current Outpatient Medications:  .  ASPIRIN 81 PO, Take 81 mg by mouth daily as needed., Disp: , Rfl:  .  benazepril (LOTENSIN) 10 MG tablet, Take 1 tablet (10 mg total) by mouth daily., Disp: 90 tablet, Rfl: 1 .  Blood Glucose Monitoring Suppl (CONTOUR NEXT EZ) w/Device KIT, USE TO CHECK BLOOD SUGAR TWICE  DAILY AS DIRECTED, Disp:  1 kit, Rfl: 0 .  CONTOUR NEXT TEST test strip, USE 1 STRIP EVERY DAY BEFORE BREAKFAST AS DIRECTED, Disp: 100 strip, Rfl: 1 .  levothyroxine (SYNTHROID) 50 MCG tablet, Take 1-2 tablets (50-100 mcg total) by mouth daily. 50 mcg daily but take two tablets on Sunday, Disp: 102 tablet, Rfl: 1 .  Multiple Vitamin (MULTIVITAMIN) capsule, Take 1 capsule by mouth daily., Disp: , Rfl:  .  rosuvastatin (CRESTOR) 40 MG tablet, Take 1 tablet (40 mg total) by mouth daily., Disp: 90 tablet, Rfl: 1 .  Semaglutide, 2 MG/DOSE, 8 MG/3ML SOPN, Inject 2 mg as directed  once a week., Disp: 9 mL, Rfl: 1 .  Vitamin D, Ergocalciferol, (DRISDOL) 1.25 MG (50000 UNIT) CAPS capsule, Take 1 capsule (50,000 Units total) by mouth every 14 (fourteen) days., Disp: 6 capsule, Rfl: 1  Allergies  Allergen Reactions  . Farxiga [Dapagliflozin] Other (See Comments)    Dysuria and recurrent yeast infection     I personally reviewed {Reviewed:14835} with the patient/caregiver today.   ROS  ***  Objective  Vitals:   04/12/23 1059  BP: 116/72  Pulse: 97  Resp: 16  SpO2: 98%  Weight: 234 lb 9.6 oz (106.4 kg)  Height: 5\' 7"  (1.702 m)    Body mass index is 36.74 kg/m.  Physical Exam ***  Recent Results (from the past 2160 hours)  IGP, Aptima HPV     Status: Abnormal   Collection Time: 02/22/23 10:04 AM  Result Value Ref Range   Interpretation EPCA,LSILB (A)     Comment: EPITHELIAL CELL ABNORMALITY. LOW GRADE SQUAMOUS INTRAEPITHELIAL LESION (LSIL).    Category LSIL (A)     Comment: Low-Grade Squamous Intraepithelial Lesion   Recommendation SFC (A)     Comment: Suggest follow up as clinically appropriate.   Adequacy SECNI     Comment: Satisfactory for evaluation. No endocervical component is identified.   Clinician Provided ICD10 Comment     Comment: Z12.4   Performed by: Comment     Comment: Jetty Peeks, Supervisory Cytotechnologist (ASCP)   Electronically signed by: Comment     Comment: Rod Mae, MD,  Pathologist   PATHOLOGIST PROVIDED ICD10: Comment     Comment: M84.132   Note: Comment     Comment: The Pap smear is a screening test designed to aid in the detection of premalignant and malignant conditions of the uterine cervix.  It is not a diagnostic procedure and should not be used as the sole means of detecting cervical cancer.  Both false-positive and false-negative reports do occur.    Test Methodology Comment     Comment: This liquid based ThinPrep(R) pap test was screened with the use of an image guided system.    HPV Aptima Positive (A) Negative    Comment: This nucleic acid amplification test detects fourteen high-risk HPV types (16,18,31,33,35,39,45,51,52,56,58,59,66,68) without differentiation.   B12 and Folate Panel     Status: None   Collection Time: 02/25/23  7:04 AM  Result Value Ref Range   Vitamin B-12 339 232 - 1,245 pg/mL   Folate 10.0 >3.0 ng/mL    Comment: A serum folate concentration of less than 3.1 ng/mL is considered to represent clinical deficiency.   Lipid panel     Status: Abnormal   Collection Time: 02/25/23  7:04 AM  Result Value Ref Range   Cholesterol, Total 131 100 - 199 mg/dL   Triglycerides 440 (H) 0 - 149 mg/dL   HDL 44 >10 mg/dL   VLDL Cholesterol Cal 27 5 - 40 mg/dL   LDL Chol Calc (NIH) 60 0 - 99 mg/dL   Chol/HDL Ratio 3.0 0.0 - 4.4 ratio    Comment:                                   T. Chol/HDL Ratio  Men  Women                               1/2 Avg.Risk  3.4    3.3                                   Avg.Risk  5.0    4.4                                2X Avg.Risk  9.6    7.1                                3X Avg.Risk 23.4   11.0   VITAMIN D 25 Hydroxy (Vit-D Deficiency, Fractures)     Status: None   Collection Time: 02/25/23  7:04 AM  Result Value Ref Range   Vit D, 25-Hydroxy 52.8 30.0 - 100.0 ng/mL    Comment: Vitamin D deficiency has been defined by the Institute of Medicine and an  Endocrine Society practice guideline as a level of serum 25-OH vitamin D less than 20 ng/mL (1,2). The Endocrine Society went on to further define vitamin D insufficiency as a level between 21 and 29 ng/mL (2). 1. IOM (Institute of Medicine). 2010. Dietary reference    intakes for calcium and D. [place]: The    [employer]. 2. Holick MF, Binkley Gilbert, Bischoff-Ferrari HA, et al.    Evaluation, treatment, and prevention of vitamin D    deficiency: an Endocrine Society clinical practice    guideline. JCEM. 2011 Jul; 96(7):1911-30.   Hemoglobin A1c     Status: Abnormal   Collection Time: 02/25/23  7:04 AM  Result Value Ref Range   Hgb A1c MFr Bld 6.2 (H) 4.8 - 5.6 %    Comment:          Prediabetes: 5.7 - 6.4          Diabetes: >6.4          Glycemic control for adults with diabetes: <7.0    Est. average glucose Bld gHb Est-mCnc 131 mg/dL  Comprehensive Metabolic Panel (CMET)     Status: Abnormal   Collection Time: 02/25/23  7:04 AM  Result Value Ref Range   Glucose 107 (H) 70 - 99 mg/dL   BUN 10 8 - 27 mg/dL   Creatinine, Ser 0.86 0.57 - 1.00 mg/dL   eGFR 90 >57 QI/ONG/2.95   BUN/Creatinine Ratio 13 12 - 28   Sodium 140 134 - 144 mmol/L   Potassium 4.5 3.5 - 5.2 mmol/L   Chloride 103 96 - 106 mmol/L   CO2 24 20 - 29 mmol/L   Calcium 9.5 8.7 - 10.3 mg/dL   Total Protein 7.2 6.0 - 8.5 g/dL   Albumin 4.5 3.8 - 4.9 g/dL   Globulin, Total 2.7 1.5 - 4.5 g/dL   Bilirubin Total 0.8 0.0 - 1.2 mg/dL   Alkaline Phosphatase 64 44 - 121 IU/L   AST 28 0 - 40 IU/L   ALT 27 0 - 32 IU/L  Urine Microalbumin w/creat. ratio     Status: Abnormal   Collection Time: 02/25/23  7:04 AM  Result Value Ref Range   Creatinine, Urine 133.1 Not Estab. mg/dL  Microalbumin, Urine 56.7 Not Estab. ug/mL   Microalb/Creat Ratio 43 (H) 0 - 29 mg/g creat    Comment:                        Normal:                0 -  29                        Moderately increased: 30 - 300                         Severely increased:       >300   TSH     Status: Abnormal   Collection Time: 02/25/23  7:04 AM  Result Value Ref Range   TSH 0.321 (L) 0.450 - 4.500 uIU/mL  Protime-INR     Status: None   Collection Time: 04/07/23 12:00 AM  Result Value Ref Range   INR    TSH     Status: None   Collection Time: 04/07/23 11:45 AM  Result Value Ref Range   TSH 0.550 0.450 - 4.500 uIU/mL    Diabetic Foot Exam: {Perform Simple Foot Exam  Perform Detailed exam:1} {Insert foot Exam (Optional):30965}   PHQ2/9:    04/12/2023   10:57 AM 02/26/2023    7:45 AM 11/04/2022    7:48 AM 06/09/2022    3:59 PM 03/26/2022    1:07 PM  Depression screen PHQ 2/9  Decreased Interest 1 2 0 0 0  Down, Depressed, Hopeless 1 2 0 0 0  PHQ - 2 Score 2 4 0 0 0  Altered sleeping 0 2 0 0   Tired, decreased energy 0 2 1 0   Change in appetite 0 0 0 0   Feeling bad or failure about yourself  0 0 0 0   Trouble concentrating 0 2 0 0   Moving slowly or fidgety/restless 0 0 0 0   Suicidal thoughts 0 0 0 0   PHQ-9 Score 2 10 1  0   Difficult doing work/chores Somewhat difficult Somewhat difficult       phq 9 is {gen pos ZOX:096045}  Fall Risk:    02/26/2023    7:45 AM 11/04/2022    7:48 AM 06/09/2022    3:59 PM 03/26/2022    1:07 PM 02/23/2022    7:57 AM  Fall Risk   Falls in the past year? 0 0 0 0 0  Number falls in past yr: 0 0  0 0  Injury with Fall? 0 0  0 0  Risk for fall due to : No Fall Risks No Fall Risks No Fall Risks  No Fall Risks  Follow up Falls prevention discussed;Education provided;Falls evaluation completed Falls prevention discussed Falls prevention discussed  Falls prevention discussed     {A&P:32071}

## 2023-04-12 NOTE — Progress Notes (Signed)
 Name: Tabitha Shaw   MRN: [id number]    DOB: 21-Apr-1960   Date:04/12/2023       Progress Note  Subjective  Chief Complaint  Chief Complaint  Patient presents with   Medical Management of Chronic Issues    TSH   HPI   Morbid obesity: BMI is again above 35, she is on Ozempic 2  mg dose since May 2024 , weight was low 220 lbs but is now stable in the 230's range.  She states she will resume going to the gym .    B12 deficiency: she has been taking SL B12 and doing well   Hair loss: she has been using topical medication, seen by dermatologist in the past    Hypothyroidism: she is now taking levothyroxine 50 mcg daily and two on Sunday 100 mcg , last TSH was at goal. She states hair is thinning out, she denies dysphagia    DMII : hgbA1C spiked to 8.1% back in August 2020, A1C but has been controlled since and last level down to 6.2 %  She is compliant  Ozempic  2 mg and she was also on  Farxiga for urine microalbuminuria however she has recurrent UTI 's and vaginitis and since medication stopped vaginitis and uti's resolved.  Denies polyphagia, polydipsia or polyuria, but she has nocturia once per night - stable. She is on statin therapy and ARB. Glucose at home has been low 100's    HTN: she is down to 10 mg of Lotensin and bp is at goal, no dizziness, chest pain or palpitation . Continue current dose    Hyperlipidemia: she is now on Crestor and LDL was at goal, continue medication   History of Primary  Hyperparathyroidism: seen by Dr. Tedd Sias and referred to Dr. Selena Batten, found to have a right lower parathyroid enlargement and had surgery on 11/26/2017, and has been released since, last Pth was normal , recent calcium level also normal range. Denies muscle cramps, polydipsia or reflux    Hemochromatosis : She was recently seen by Dr. Orlie Dakin.   Memory difficulties: she is worried because daughter noticed she has been forgetful, there is a family history of Alzheimer's dementia, and her  23&Me test was positive. She saw Dr. Sherryll Burger, she was diagnosed with mild cognitive dysfunction, she has not been compliant with CPAP but using an oral device and states her watch has tracked good sleep. She is also taking baby aspirin as recommended by Dr. Kathi Simpers Sellar Mass: found on MRI brain, had it repeated 02/23    1. 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 Otherwise unremarkable brain MRI. 2. Essentially stable NeuroQuant volumetric analysis of the brain, see details on [employer].   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 Tiburcio Pea, she had repeat pap smear April 2022 that was normal , she had a repeat pap smear done April 2023 that was normal but positive HPV ,had a normal pap smear after that but again positive and LGIL 02/2023 and is going back for a biopsy next month. She wants to consider getting the cervix removed since already does not have an uterus     OSA: she has the CPAP at home but cannot tolerate it .  She has purchased and oral device online. She states it helps her sleep   Vitamin D: she is now taking otc supplementation   Grieving: lost her husband early 2025 but states starting  to feel better   Patient Active Problem List   Diagnosis Date Noted   Plantar fasciitis of left foot 01/25/2023   Obesity (BMI 30.0-34.9) 09/03/2021   History of parathyroidectomy 08/06/2021   S/P laparoscopic supracervical hysterectomy 04/23/2020   Dysplasia of cervix, low grade (CIN 1) 11/22/2019   History of colonic polyps    Polyp of colon    Cervical high risk HPV (human papillomavirus) test positive 09/03/2016   B12 deficiency 09/04/2015   Elevated PTHrP level 09/04/2015   Benign hypertension 09/12/2014   Acanthosis nigricans 09/09/2014   Diabetes mellitus with renal manifestation (HCC) 09/09/2014   Dyslipidemia due to type 2 diabetes mellitus (HCC) 09/09/2014   Abnormal electrocardiogram 09/09/2014   Enlarged kidney 09/09/2014    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HCC) 09/09/2014   Enlarged liver 09/09/2014   Hypercalcemia 09/09/2014   Adult hypothyroidism 09/09/2014   Microalbuminuria 09/09/2014   Spinal stenosis of lumbar region with neurogenic claudication 09/09/2014   Osteopenia 09/09/2014   Vitamin D deficiency 09/09/2014    Past Surgical History:  Procedure Laterality Date   ABDOMINAL HYSTERECTOMY  2005   blood transfusion yearly     BREAST REDUCTION SURGERY     CESAREAN SECTION     CESAREAN SECTION  1992   CHOLECYSTECTOMY  2000   COLONOSCOPY WITH PROPOFOL N/A 11/11/2018   Procedure: COLONOSCOPY WITH PROPOFOL;  Surgeon: Pasty Spillers, MD;  Location: [facility];  Service: Endoscopy;  Laterality: N/A;   COLONOSCOPY WITH PROPOFOL N/A 02/16/2020   Procedure: COLONOSCOPY WITH PROPOFOL;  Surgeon: Pasty Spillers, MD;  Location: [facility] END[facility]COPY;  Service: Endoscopy;  Laterality: N/A;   COSMETIC SURGERY  1989   Breast reduction   lapcholecystectomy     PARATHYROIDECTOMY Right 11/26/2017   right lower , Dr. Selena Batten at [facility]   REDUCTION MAMMAPLASTY Bilateral 1985   VESICOVAGINAL FISTULA CLOSURE W/ TAH      Family History  Problem Relation Age of Onset   Colon polyps Mother    Hemachromatosis Mother    Hypertension Mother    Depression Sister    Hypertension Brother    Colon polyps Brother    Cancer Father    Breast cancer Cousin    Breast cancer Cousin    Hypertension Sister     Social History   Tobacco Use   Smoking status: Former    Current packs/day: 0.00    Average packs/day: 0.3 packs/day for 10.0 years (2.5 ttl pk-yrs)    Types: Cigarettes    Start date: 01/19/1982    Quit date: 01/19/1990    Years since quitting: 33.2   Smokeless tobacco: Never  Substance Use Topics   Alcohol use: Yes    Comment: occasionally     Current Outpatient Medications:    ASPIRIN 81 PO, Take 81 mg by mouth daily as needed., Disp: , Rfl:    benazepril (LOTENSIN) 10 MG tablet, Take 1 tablet (10 mg total)  by mouth daily., Disp: 90 tablet, Rfl: 1   Blood Glucose Monitoring Suppl (CONTOUR NEXT EZ) w/Device KIT, USE TO CHECK BLOOD SUGAR TWICE  DAILY AS DIRECTED, Disp: 1 kit, Rfl: 0   CONTOUR NEXT TEST test strip, USE 1 STRIP EVERY DAY BEFORE BREAKFAST AS DIRECTED, Disp: 100 strip, Rfl: 1   levothyroxine (SYNTHROID) 50 MCG tablet, Take 1-2 tablets (50-100 mcg total) by mouth daily. 50 mcg daily but take two tablets on Sunday, Disp: 102 tablet, Rfl: 1   Multiple Vitamin (MULTIVITAMIN) capsule, Take 1 capsule by mouth daily., Disp: ,  Rfl:    rosuvastatin (CRESTOR) 40 MG tablet, Take 1 tablet (40 mg total) by mouth daily., Disp: 90 tablet, Rfl: 1   Semaglutide, 2 MG/DOSE, 8 MG/3ML SOPN, Inject 2 mg as directed once a week., Disp: 9 mL, Rfl: 1   Vitamin D, Ergocalciferol, (DRISDOL) 1.25 MG (50000 UNIT) CAPS capsule, Take 1 capsule (50,000 Units total) by mouth every 14 (fourteen) days., Disp: 6 capsule, Rfl: 1  Allergies  Allergen Reactions   Farxiga [Dapagliflozin] Other (See Comments)    Dysuria and recurrent yeast infection     I personally reviewed active problem list, medication list, allergies with the patient/caregiver today.   ROS  Ten systems reviewed and is negative except as mentioned in HPI    Objective  Vitals:   04/12/23 1059  BP: 116/72  Pulse: 97  Resp: 16  SpO2: 98%  Weight: 234 lb 9.6 oz (106.4 kg)  Height: 5\' 7"  (1.702 m)    Body mass index is 36.74 kg/m.  Physical Exam  Constitutional: Patient appears well-developed and well-nourished. Obese  No distress.  HEENT: head atraumatic, normocephalic, pupils equal and reactive to light,, neck supple Cardiovascular: Normal rate, regular rhythm and normal heart sounds.  No murmur heard. No BLE edema. Pulmonary/Chest: Effort normal and breath sounds normal. No respiratory distress. Abdominal: Soft.  There is no tenderness. Psychiatric: Patient has a normal mood and affect. behavior is normal. Judgment and thought content  normal.   Recent Results (from the past 2160 hours)  IGP, Aptima HPV     Status: Abnormal   Collection Time: 02/22/23 10:04 AM  Result Value Ref Range   Interpretation EPCA,LSILB (A)     Comment: EPITHELIAL CELL ABNORMALITY. LOW GRADE SQUAMOUS INTRAEPITHELIAL LESION (LSIL).    Category LSIL (A)     Comment: Low-Grade Squamous Intraepithelial Lesion   Recommendation SFC (A)     Comment: Suggest follow up as clinically appropriate.   Adequacy SECNI     Comment: Satisfactory for evaluation. No endocervical component is identified.   Clinician Provided ICD10 Comment     Comment: Z12.4   Performed by: Comment     Comment: Jetty Peeks, Supervisory Cytotechnologist (ASCP)   Electronically signed by: Comment     Comment: Rod Mae, MD, Pathologist   PATHOLOGIST PROVIDED ICD10: Comment     Comment: Z61.096   Note: Comment     Comment: The Pap smear is a screening test designed to aid in the detection of premalignant and malignant conditions of the uterine cervix.  It is not a diagnostic procedure and should not be used as the sole means of detecting cervical cancer.  Both false-positive and false-negative reports do occur.    Test Methodology Comment     Comment: This liquid based ThinPrep(R) pap test was screened with the use of an image guided system.    HPV Aptima Positive (A) Negative    Comment: This nucleic acid amplification test detects fourteen high-risk HPV types (16,18,31,33,35,39,45,51,52,56,58,59,66,68) without differentiation.   B12 and Folate Panel     Status: None   Collection Time: 02/25/23  7:04 AM  Result Value Ref Range   Vitamin B-12 339 232 - 1,245 pg/mL   Folate 10.0 >3.0 ng/mL    Comment: A serum folate concentration of less than 3.1 ng/mL is considered to represent clinical deficiency.   Lipid panel     Status: Abnormal   Collection Time: 02/25/23  7:04 AM  Result Value Ref Range   Cholesterol, Total 131 100 - 199  mg/dL   Triglycerides 161 (H) 0 -  149 mg/dL   HDL 44 >09 mg/dL   VLDL Cholesterol Cal 27 5 - 40 mg/dL   LDL Chol Calc (NIH) 60 0 - 99 mg/dL   Chol/HDL Ratio 3.0 0.0 - 4.4 ratio    Comment:                                   T. Chol/HDL Ratio                                             Men  Women                               1/2 Avg.Risk  3.4    3.3                                   Avg.Risk  5.0    4.4                                2X Avg.Risk  9.6    7.1                                3X Avg.Risk 23.4   11.0   VITAMIN D 25 Hydroxy (Vit-D Deficiency, Fractures)     Status: None   Collection Time: 02/25/23  7:04 AM  Result Value Ref Range   Vit D, 25-Hydroxy 52.8 30.0 - 100.0 ng/mL    Comment: Vitamin D deficiency has been defined by the Institute of Medicine and an Endocrine Society practice guideline as a level of serum 25-OH vitamin D less than 20 ng/mL (1,2). The Endocrine Society went on to further define vitamin D insufficiency as a level between 21 and 29 ng/mL (2). 1. IOM (Institute of Medicine). 2010. Dietary reference    intakes for calcium and D. [place]: The    [employer]. 2. Holick MF, Binkley Homecroft, Bischoff-Ferrari HA, et al.    Evaluation, treatment, and prevention of vitamin D    deficiency: an Endocrine Society clinical practice    guideline. JCEM. 2011 Jul; 96(7):1911-30.   Hemoglobin A1c     Status: Abnormal   Collection Time: 02/25/23  7:04 AM  Result Value Ref Range   Hgb A1c MFr Bld 6.2 (H) 4.8 - 5.6 %    Comment:          Prediabetes: 5.7 - 6.4          Diabetes: >6.4          Glycemic control for adults with diabetes: <7.0    Est. average glucose Bld gHb Est-mCnc 131 mg/dL  Comprehensive Metabolic Panel (CMET)     Status: Abnormal   Collection Time: 02/25/23  7:04 AM  Result Value Ref Range   Glucose 107 (H) 70 - 99 mg/dL   BUN 10 8 - 27 mg/dL   Creatinine, Ser 6.04 0.57 - 1.00 mg/dL   eGFR 90 >54 UJ/WJX/9.14   BUN/Creatinine Ratio 13 12 - 28   Sodium  140 134 -  144 mmol/L   Potassium 4.5 3.5 - 5.2 mmol/L   Chloride 103 96 - 106 mmol/L   CO2 24 20 - 29 mmol/L   Calcium 9.5 8.7 - 10.3 mg/dL   Total Protein 7.2 6.0 - 8.5 g/dL   Albumin 4.5 3.8 - 4.9 g/dL   Globulin, Total 2.7 1.5 - 4.5 g/dL   Bilirubin Total 0.8 0.0 - 1.2 mg/dL   Alkaline Phosphatase 64 44 - 121 IU/L   AST 28 0 - 40 IU/L   ALT 27 0 - 32 IU/L  Urine Microalbumin w/creat. ratio     Status: Abnormal   Collection Time: 02/25/23  7:04 AM  Result Value Ref Range   Creatinine, Urine 133.1 Not Estab. mg/dL   Microalbumin, Urine 60.4 Not Estab. ug/mL   Microalb/Creat Ratio 43 (H) 0 - 29 mg/g creat    Comment:                        Normal:                0 -  29                        Moderately increased: 30 - 300                        Severely increased:       >300   TSH     Status: Abnormal   Collection Time: 02/25/23  7:04 AM  Result Value Ref Range   TSH 0.321 (L) 0.450 - 4.500 uIU/mL  Protime-INR     Status: None   Collection Time: 04/07/23 12:00 AM  Result Value Ref Range   INR    TSH     Status: None   Collection Time: 04/07/23 11:45 AM  Result Value Ref Range   TSH 0.550 0.450 - 4.500 uIU/mL    Diabetic Foot Exam:     PHQ2/9:    04/12/2023   10:57 AM 02/26/2023    7:45 AM 11/04/2022    7:48 AM 06/09/2022    3:59 PM 03/26/2022    1:07 PM  Depression screen PHQ 2/9  Decreased Interest 1 2 0 0 0  Down, Depressed, Hopeless 1 2 0 0 0  PHQ - 2 Score 2 4 0 0 0  Altered sleeping 0 2 0 0   Tired, decreased energy 0 2 1 0   Change in appetite 0 0 0 0   Feeling bad or failure about yourself  0 0 0 0   Trouble concentrating 0 2 0 0   Moving slowly or fidgety/restless 0 0 0 0   Suicidal thoughts 0 0 0 0   PHQ-9 Score 2 10 1  0   Difficult doing work/chores Somewhat difficult Somewhat difficult       phq 9 is positive  Fall Risk:    02/26/2023    7:45 AM 11/04/2022    7:48 AM 06/09/2022    3:59 PM 03/26/2022    1:07 PM 02/23/2022    7:57 AM  Fall Risk   Falls in  the past year? 0 0 0 0 0  Number falls in past yr: 0 0  0 0  Injury with Fall? 0 0  0 0  Risk for fall due to : No Fall Risks No Fall Risks No Fall Risks  No Fall Risks  Follow  up Falls prevention discussed;Education provided;Falls evaluation completed Falls prevention discussed Falls prevention discussed  Falls prevention discussed     Assessment & Plan  1. 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HCC) (Primary)  Continue visits with  hematologist   2. Dyslipidemia due to type 2 diabetes mellitus (HCC)  - EKG 12-Lead - rosuvastatin (CRESTOR) 40 MG tablet; Take 1 tablet (40 mg total) by mouth daily.  Dispense: 90 tablet; Refill: 1  3. Adult hypothyroidism  - levothyroxine (SYNTHROID) 50 MCG tablet; Take 1-2 tablets (50-100 mcg total) by mouth daily. 50 mcg daily but take two tablets on Sunday  Dispense: 102 tablet; Refill: 1  4. Vitamin D deficiency  Continue supplementation   5. History of parathyroidectomy   6. Benign hypertension  - benazepril (LOTENSIN) 10 MG tablet; Take 1 tablet (10 mg total) by mouth daily.  Dispense: 90 tablet; Refill: 1  7. B12 deficiency  Continue supplementation   8. Osteopenia of lumbar spine  Discussed high calcium diet and also vitamin D   9. Type 2 diabetes mellitus with microalbuminuria, with long-term current use of insulin (HCC)  - Semaglutide, 2 MG/DOSE, 8 MG/3ML SOPN; Inject 2 mg as directed once a week.  Dispense: 9 mL; Refill: 1

## 2023-05-04 ENCOUNTER — Ambulatory Visit: Admitting: Obstetrics

## 2023-05-12 NOTE — Progress Notes (Signed)
 Referring Provider:  Angelita Kendall   HPI:  Tabitha Shaw is a 63 y.o.  G1P1  who presents today for evaluation and management of abnormal cervical cytology.    Prior pap smears:  Date:02/22/23 AVW:UJWJ      HPV: Positive  Date:02/23/22 XBJ:YNWG     HPV:Not Tested Date:04/22/21 NFA:OZHY     QMV:HQIONGEX Date:04/23/20 BMW:UXLK     HPV:Not Tested Date:09/19/19 GMW:NUUVO ZDG:UYQIHKVQ High Risk Date:09/07/18 QVZ:DGLO     VFI:EPPIRJJO High Risk Date:08/31/17 ACZ:YSAY     TKZ:SWFUXNAT High Risk Date:08/28/16 FTD:DUKG      URK:YHCWCBJS High Risk Date:03/30/14 EGB:TDVV  Prior cervical / vaginal findings: Yes Date:10/24/19 Results: ECC neg, 6:00 CIN1, 12:00 neg  Prior cervical treatment(s): No  Symptoms/History:  -Abnormal vaginal discharge: no -Postmenopausal: no -Intermenstrual bleeding: no -Postcoital bleeding: no -Bleeding problems (non-gyn): no -Contraception: n/a -Number of current sexual partners: no -Number of partners in lifetime: 6 -History of a high risk partner: no -History of STDs: no -Smoking: no -Gardasil Vaccine: no      ROS:  Pertinent items are noted in HPI.  OB History  Gravida Para Term Preterm AB Living  1 1    1   SAB IAB Ectopic Multiple Live Births          # Outcome Date GA Lbr Len/2nd Weight Sex Type Anes PTL Lv  1 Para             Obstetric Comments  1st Menstrual Cycle:  15  1st Pregnancy:  30    Past Medical History:  Diagnosis Date   Acquired acanthosis nigricans    Blood transfusion without reported diagnosis 2001   Heavy cycle   Diabetes type 2, controlled (HCC)    if controlled was not specified   Hemochromatosis    Hepatomegaly    HLD (hyperlipidemia)    HTN (hypertension)    Hyperlipidemia    Hypertension    Hypertrophy of kidney    Hypothyroidism    Intertrigo    Obesity    Osteopenia    Postinflammatory hyperpigmentation    Sleep apnea    Snoring    Thyroid  disease    Vitamin D  deficiency     Past Surgical History:   Procedure Laterality Date   ABDOMINAL HYSTERECTOMY  2005   blood transfusion yearly     BREAST REDUCTION SURGERY     CESAREAN SECTION     CESAREAN SECTION  1992   CHOLECYSTECTOMY  2000   COLONOSCOPY WITH PROPOFOL  N/A 11/11/2018   Procedure: COLONOSCOPY WITH PROPOFOL ;  Surgeon: Irby Mannan, MD;  Location: [facility];  Service: Endoscopy;  Laterality: N/A;   COLONOSCOPY WITH PROPOFOL  N/A 02/16/2020   Procedure: COLONOSCOPY WITH PROPOFOL ;  Surgeon: Irby Mannan, MD;  Location: [facility];  Service: Endoscopy;  Laterality: N/A;   COSMETIC SURGERY  1989   Breast reduction   lapcholecystectomy     PARATHYROIDECTOMY Right 11/26/2017   right lower , Dr. Burdette Carolin at [facility]   REDUCTION MAMMAPLASTY Bilateral 1985   VESICOVAGINAL FISTULA CLOSURE W/ TAH      SOCIAL HISTORY:  Social History   Substance and Sexual Activity  Alcohol Use Yes   Comment: occasionally    Social History   Substance and Sexual Activity  Drug Use No     Family History  Problem Relation Age of Onset   Colon polyps Mother    Hemachromatosis Mother    Hypertension Mother    Depression Sister    Hypertension  Brother    Colon polyps Brother    Cancer Father    Breast cancer Cousin    Breast cancer Cousin    Hypertension Sister     ALLERGIES:  Farxiga  [dapagliflozin ]  She has a current medication list which includes the following prescription(s): aspirin, benazepril , contour next ez, cholecalciferol, contour next test, levothyroxine , multivitamin, rosuvastatin , and semaglutide  (2 mg/dose).  Physical Exam: -Vitals:  BP (!) 144/79   Pulse 71   Ht 5\' 7"  (1.702 m)   Wt 233 lb (105.7 kg)   BMI 36.49 kg/m   PROCEDURE: Colposcopy performed with 4% acetic acid and Lugol's after informed consent obtained.  Physical Exam Vitals and nursing note reviewed. Exam conducted with a chaperone present.  Constitutional:      Appearance: Normal appearance. She is obese.  HENT:     Head:  Normocephalic and atraumatic.  Eyes:     Extraocular Movements: Extraocular movements intact.  Pulmonary:     Effort: Pulmonary effort is normal.  Genitourinary:    General: Normal vulva.     Vagina: Lesions (R and L lateral walls with pendunculated growths/masses) present.     Uterus: Absent.         Comments: RED = acetowhite and decreased uptake BLUE = biopsies Neurological:     General: No focal deficit present.     Mental Status: She is alert.  Psychiatric:        Mood and Affect: Mood normal.                              -Aceto-white Lesions Location(s): See above              -Biopsy performed at 9 and 1 o'clock; also R and L vaginal walls for growths              -ECC indicated and performed: Yes.  Scant due to severe stenosis, unable to dilate     -Biopsy sites made hemostatic with pressure and Monsel's solution   -Satisfactory colposcopy: Yes.      -Evidence of Invasive cervical CA :  NO  ASSESSMENT:  Tabitha Shaw is a 63 y.o. G1P1 with LSIL and HPV-HR positive (undiff) on recent pap (02/22/23), here for colposcopy today, performed as above without complications. Pt is s/p partial hysterectomy and ECC difficult to obtain due to severe stenosis. Ultimately desires to have cervix surgically removed.  -ECC and 2 cervical bx sent to pathology; 2 vaginal biopsies also sent for growths seen. -Aftercare instructions for home reviewed, si/sx of when to call/return discussed. -Will call with results    Sofia Dunn, DO [facility]

## 2023-05-18 ENCOUNTER — Other Ambulatory Visit: Payer: Self-pay | Admitting: Obstetrics

## 2023-05-18 ENCOUNTER — Encounter: Payer: Self-pay | Admitting: Obstetrics

## 2023-05-18 ENCOUNTER — Ambulatory Visit: Admitting: Obstetrics

## 2023-05-18 VITALS — BP 144/79 | HR 71 | Ht 67.0 in | Wt 233.0 lb

## 2023-05-18 DIAGNOSIS — N888 Other specified noninflammatory disorders of cervix uteri: Secondary | ICD-10-CM

## 2023-05-18 DIAGNOSIS — N898 Other specified noninflammatory disorders of vagina: Secondary | ICD-10-CM

## 2023-05-18 DIAGNOSIS — R87810 Cervical high risk human papillomavirus (HPV) DNA test positive: Secondary | ICD-10-CM

## 2023-05-18 DIAGNOSIS — R87612 Low grade squamous intraepithelial lesion on cytologic smear of cervix (LGSIL): Secondary | ICD-10-CM

## 2023-05-18 NOTE — Patient Instructions (Signed)
 Colposcopy, Care After  The following information offers guidance on how to care for yourself after your procedure. Your doctor may also give you more specific instructions. If you have problems or questions, contact your doctor. What can I expect after the procedure? If you did not have a sample of your tissue taken out (did not have a biopsy), you may only have some spotting of blood for a few days. You can go back to your normal activities. If you had a sample of your tissue taken out, it is common to have: Soreness and mild pain. These may last for a few days. Mild bleeding or fluid (discharge) coming from your vagina. The fluid will look dark and grainy. You may have this for a few days. The fluid may be caused by a liquid that was used during your procedure. You may need to wear a sanitary pad. Spotting of blood for at least 48 hours after the procedure. Follow these instructions at home: Medicines Take over-the-counter and prescription medicines only as told by your doctor. Ask your doctor what over-the-counter pain medicines and prescription medicines you can start taking again. This is very important if you take blood thinners. Activity For at least 3 days, or for as long as told by your doctor, avoid: Douching. Using tampons. Having sex. Return to your normal activities as told by your doctor. Ask your doctor what activities are safe for you. General instructions Ask your doctor if you may take baths, swim, or use a hot tub. You may take showers. If you use birth control (contraception), keep using it. Keep all follow-up visits. Contact a doctor if: You have a fever or chills. You faint or feel light-headed. Get help right away if: You bleed a lot from your vagina. A lot of bleeding means that the bleeding soaks through a pad in less than 1 hour. You have clumps of blood (blood clots) coming from your vagina. You have signs that could mean you have an infection. This may be  fluid coming from your vagina that is: Different than normal. Yellow. Bad-smelling. You have very bad pain or cramps in your lower belly that do not get better with medicine. Summary If you did not have a sample of your tissue taken out, you may only have some spotting of blood for a few days. You can go back to your normal activities. If you had a sample of your tissue taken out, it is common to have mild pain for a few days and spotting for 48 hours. Avoid douching, using tampons, and having sex for at least 3 days after the procedure or for as long as told. Get help right away if you have a lot of bleeding, very bad pain, or signs of infection. This information is not intended to replace advice given to you by your health care provider. Make sure you discuss any questions you have with your health care provider. Document Revised: 06/02/2020 Document Reviewed: 06/02/2020 Elsevier Patient Education  2024 [employer].

## 2023-05-22 LAB — ANATOMIC PATHOLOGY REPORT

## 2023-05-25 ENCOUNTER — Encounter: Payer: Self-pay | Admitting: Obstetrics

## 2023-05-30 ENCOUNTER — Encounter: Payer: Self-pay | Admitting: Family Medicine

## 2023-06-04 ENCOUNTER — Telehealth: Payer: Self-pay | Admitting: Oncology

## 2023-06-04 NOTE — Telephone Encounter (Signed)
 Patient called to request [facility] form to get her labs done prior to her appointment. She is scheduled to see lauren 5/23. Please call her when ready.

## 2023-06-09 ENCOUNTER — Encounter: Payer: Self-pay | Admitting: Oncology

## 2023-06-11 ENCOUNTER — Encounter: Payer: Self-pay | Admitting: Nurse Practitioner

## 2023-06-11 ENCOUNTER — Inpatient Hospital Stay: Payer: Managed Care, Other (non HMO)

## 2023-06-11 ENCOUNTER — Inpatient Hospital Stay: Payer: Managed Care, Other (non HMO) | Attending: Oncology | Admitting: Nurse Practitioner

## 2023-06-11 DIAGNOSIS — K760 Fatty (change of) liver, not elsewhere classified: Secondary | ICD-10-CM

## 2023-06-11 NOTE — Progress Notes (Signed)
 Pt declined phlebotomy today. No treatment today.

## 2023-06-11 NOTE — Progress Notes (Signed)
 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  INTERVAL HISTORY: Patient returns to clinic today for routine evaluation and continuation of phlebotomy.  She currently feels well and is asymptomatic. She has no neurologic complaints.  She denies any recent fevers or illnesses.  She denies any chest pain, shortness of breath, cough, or hemoptysis.  She denies any weakness or fatigue.  She has a good appetite and denies weight loss. She denies any nausea, vomiting, constipation, or diarrhea. She has no urinary complaints.  Patient feels at her baseline offers no specific complaints today.    REVIEW OF SYSTEMS:   Review of Systems  Constitutional: Negative.  Negative for fever, malaise/fatigue and weight loss.  Respiratory: Negative.  Negative for cough and shortness of breath.   Cardiovascular: Negative.  Negative for chest pain and leg swelling.  Gastrointestinal: Negative.  Negative for abdominal pain, blood in stool and melena.  Genitourinary: Negative.  Negative for dysuria.  Musculoskeletal: Negative.  Negative for back pain.  Skin: Negative.  Negative for rash.  Neurological: Negative.  Negative for sensory change, focal weakness and weakness.  Psychiatric/Behavioral: Negative.  The patient is not nervous/anxious.   As per HPI. Otherwise, a complete review of systems is negative.  PAST MEDICAL HISTORY: Past Medical History:  Diagnosis Date   Acquired acanthosis nigricans    Blood transfusion without reported diagnosis 2001   Heavy cycle   Diabetes type 2,  controlled (HCC)    if controlled was not specified   Hemochromatosis    Hepatomegaly    HLD (hyperlipidemia)    HTN (hypertension)    Hyperlipidemia    Hypertension    Hypertrophy of kidney    Hypothyroidism    Intertrigo    Obesity    Osteopenia    Postinflammatory hyperpigmentation    Sleep apnea    Snoring    Thyroid  disease    Vitamin D  deficiency     PAST SURGICAL HISTORY: Past Surgical History:  Procedure Laterality Date   ABDOMINAL HYSTERECTOMY  2005   blood transfusion yearly     BREAST REDUCTION SURGERY     CESAREAN SECTION     CESAREAN SECTION  1992   CHOLECYSTECTOMY  2000   COLONOSCOPY WITH PROPOFOL  N/A 11/11/2018   Procedure: COLONOSCOPY WITH PROPOFOL ;  Surgeon: Irby Mannan, MD;  Location: [facility] ENDOSCOPY;  Service: Endoscopy;  Laterality: N/A;   COLONOSCOPY WITH PROPOFOL  N/A 02/16/2020   Procedure: COLONOSCOPY WITH PROPOFOL ;  Surgeon: Irby Mannan, MD;  Location: [facility] ENDOSCOPY;  Service: Endoscopy;  Laterality: N/A;   COSMETIC SURGERY  1989   Breast reduction   lapcholecystectomy     PARATHYROIDECTOMY Right 11/26/2017   right lower , Dr. Burdette Carolin at [facility]   REDUCTION MAMMAPLASTY Bilateral 1985   VESICOVAGINAL FISTULA CLOSURE W/ TAH      FAMILY HISTORY Family History  Problem Relation Age of Onset   Colon polyps Mother    Hemachromatosis Mother    Hypertension Mother    Depression Sister    Hypertension Brother    Colon polyps Brother    Cancer Father    Breast cancer Cousin    Breast cancer  Cousin    Hypertension Sister        ADVANCED DIRECTIVES:    HEALTH MAINTENANCE: Social History   Tobacco Use   Smoking status: Former    Current packs/day: 0.00    Average packs/day: 0.3 packs/day for 10.0 years (2.5 ttl pk-yrs)    Types: Cigarettes    Start date: 01/19/1982    Quit date: 01/19/1990    Years since quitting: 33.4   Smokeless tobacco: Never  Vaping Use   Vaping status: Never Used  Substance Use Topics   Alcohol use: Yes     Comment: occasionally   Drug use: No     Colonoscopy:  PAP:  Bone density:  Lipid panel:  Allergies  Allergen Reactions   Farxiga  [Dapagliflozin ] Other (See Comments)    Dysuria and recurrent yeast infection     Current Outpatient Medications  Medication Sig Dispense Refill   ASPIRIN 81 PO Take 81 mg by mouth daily as needed.     benazepril  (LOTENSIN ) 10 MG tablet Take 1 tablet (10 mg total) by mouth daily. 90 tablet 1   Blood Glucose Monitoring Suppl (CONTOUR NEXT EZ) w/Device KIT USE TO CHECK BLOOD SUGAR TWICE  DAILY AS DIRECTED 1 kit 0   cholecalciferol (VITAMIN D3) 25 MCG (1000 UNIT) tablet Take 1,000 Units by mouth daily.     CONTOUR NEXT TEST test strip USE 1 STRIP EVERY DAY BEFORE BREAKFAST AS DIRECTED 100 strip 1   levothyroxine  (SYNTHROID ) 50 MCG tablet Take 1-2 tablets (50-100 mcg total) by mouth daily. 50 mcg daily but take two tablets on Sunday 102 tablet 1   Multiple Vitamin (MULTIVITAMIN) capsule Take 1 capsule by mouth daily.     rosuvastatin  (CRESTOR ) 40 MG tablet Take 1 tablet (40 mg total) by mouth daily. 90 tablet 1   Semaglutide , 2 MG/DOSE, 8 MG/3ML SOPN Inject 2 mg as directed once a week. 9 mL 1   No current facility-administered medications for this visit.    OBJECTIVE: There were no vitals filed for this visit.    There is no height or weight on file to calculate BMI.    ECOG FS:0 - Asymptomatic  General: Well-developed, well-nourished, no acute distress. Eyes: Pink conjunctiva, anicteric sclera. HEENT: Normocephalic, moist mucous membranes. Lungs: No audible wheezing or coughing. Heart: Regular rate and rhythm. Abdomen: Soft, nontender, no obvious distention. Musculoskeletal: No edema, cyanosis, or clubbing. Neuro: Alert, answering all questions appropriately. Cranial nerves grossly intact. Skin: No rashes or petechiae noted. Psych: Normal affect.  LAB RESULTS:  Lab Results  Component Value Date   NA 140 02/25/2023   K 4.5 02/25/2023    CL 103 02/25/2023   CO2 24 02/25/2023   GLUCOSE 107 (H) 02/25/2023   BUN 10 02/25/2023   CREATININE 0.76 02/25/2023   CALCIUM  9.5 02/25/2023   PROT 7.2 02/25/2023   ALBUMIN 4.5 02/25/2023   AST 28 02/25/2023   ALT 27 02/25/2023   ALKPHOS 64 02/25/2023   BILITOT 0.8 02/25/2023   GFRNONAA 81 05/12/2019   GFRAA 94 05/12/2019    Lab Results  Component Value Date   WBC 7.9 02/18/2022   NEUTROABS 3.8 02/18/2022   HGB 17.2 (H) 02/18/2022   HCT 48.2 (H) 02/18/2022   MCV 105 (H) 02/18/2022   PLT 206 02/18/2022     STUDIES: No results found.  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 The patient's most recent hemoglobin  is 16.3, ferritin 89. We have been performing phlebotomy every 4 months. Prefers to hold phlebotomy today. Plan to recheck labs  at [facility] +/- phlebotomy. HCC Screening- US  liver 07/16/20 showed increased echogenicity of liver consistent with fatty infiltration and or hepatocellular disease. No focal abnormalities identified. 03/11/22- AFP was 4.7. Plan to check annually. Will refer to GI for management. Encouraged avoidance of hepatotoxic substances and management of fatty liver. Previously known to Dr Tully Gainer. Requests to stay in [place] area therefore, I will refer to [facility]. Ultrasound ordered. Plan to check afp at next lab draw.  Parathyroid  surgery: Continue treatment and follow-up at [facility]. Brain MRI: 6 mm likely benign lesion noted.  Continue follow-up with neurology as indicated. Cascade testing: her daughter is a carrier for hemochromatosis. We reviewed need for regular lab monitoring with pcp. Recommend other relatives be tested as well.   Disposition:  Hold phlebotomy Liver ultrasound Ref to GI for HCC screening 4 months - labs (cbc, ferritin, iron studies, afp) at [facility] & poss phlebotomy 8 months - labs (cbc, ferritin, iron studies) at  [facility] & poss phlebotomy 12 months- labs (cbc, ferritin, iron studies) at [facility], see me, & poss phlebotomy - [place]  I spent a total of 30 minutes reviewing chart data, face-to-face evaluation with the patient, counseling and coordination of care as detailed above.  Patient expressed understanding and was in agreement with this plan. She also understands that She can call clinic at any time with any questions, concerns, or complaints.   Nelda Balsam, NP   06/11/2023

## 2023-06-17 ENCOUNTER — Encounter: Payer: Self-pay | Admitting: Oncology

## 2023-06-18 ENCOUNTER — Ambulatory Visit
Admission: RE | Admit: 2023-06-18 | Discharge: 2023-06-18 | Disposition: A | Discharge status: Home / Self Care | Source: Ambulatory Visit | Attending: Nurse Practitioner | Admitting: Nurse Practitioner

## 2023-06-18 DIAGNOSIS — K760 Fatty (change of) liver, not elsewhere classified: Secondary | ICD-10-CM | POA: Insufficient documentation

## 2023-06-29 ENCOUNTER — Encounter: Payer: Self-pay | Admitting: Nurse Practitioner

## 2023-07-08 LAB — HM DIABETES EYE EXAM

## 2023-08-10 ENCOUNTER — Other Ambulatory Visit: Payer: Self-pay | Admitting: Family Medicine

## 2023-08-12 ENCOUNTER — Ambulatory Visit: Payer: Self-pay | Admitting: Family Medicine

## 2023-08-12 LAB — COMPREHENSIVE METABOLIC PANEL WITH GFR
ALT: 32 IU/L (ref 0–32)
AST: 34 IU/L (ref 0–40)
Albumin: 4.4 g/dL (ref 3.8–4.9)
Alkaline Phosphatase: 61 IU/L (ref 44–121)
BUN/Creatinine Ratio: 13 (ref 12–28)
BUN: 10 mg/dL (ref 8–27)
Bilirubin Total: 0.7 mg/dL (ref 0.0–1.2)
CO2: 23 mmol/L (ref 20–29)
Calcium: 9.4 mg/dL (ref 8.7–10.3)
Chloride: 103 mmol/L (ref 96–106)
Creatinine, Ser: 0.75 mg/dL (ref 0.57–1.00)
Globulin, Total: 2.2 g/dL (ref 1.5–4.5)
Glucose: 142 mg/dL — ABNORMAL HIGH (ref 70–99)
Potassium: 4.4 mmol/L (ref 3.5–5.2)
Sodium: 139 mmol/L (ref 134–144)
Total Protein: 6.6 g/dL (ref 6.0–8.5)
eGFR: 91 mL/min/1.73 (ref >59)

## 2023-08-12 LAB — HEMOGLOBIN A1C
Est. average glucose Bld gHb Est-mCnc: 137 mg/dL
Hgb A1c MFr Bld: 6.4 % — ABNORMAL HIGH (ref 4.8–5.6)

## 2023-08-12 LAB — TSH: TSH: 1.5 u[IU]/mL (ref 0.450–4.500)

## 2023-08-12 LAB — PTH, INTACT AND CALCIUM: PTH: 32 pg/mL (ref 15–65)

## 2023-08-13 ENCOUNTER — Encounter: Payer: Self-pay | Admitting: Family Medicine

## 2023-08-13 ENCOUNTER — Ambulatory Visit: Admitting: Family Medicine

## 2023-08-13 VITALS — BP 122/80 | HR 85 | Resp 16 | Ht 67.0 in | Wt 238.0 lb

## 2023-08-13 DIAGNOSIS — Z794 Long term (current) use of insulin: Secondary | ICD-10-CM

## 2023-08-13 DIAGNOSIS — E1169 Type 2 diabetes mellitus with other specified complication: Secondary | ICD-10-CM | POA: Diagnosis not present

## 2023-08-13 DIAGNOSIS — E1129 Type 2 diabetes mellitus with other diabetic kidney complication: Secondary | ICD-10-CM

## 2023-08-13 DIAGNOSIS — Z23 Encounter for immunization: Secondary | ICD-10-CM | POA: Diagnosis not present

## 2023-08-13 DIAGNOSIS — E785 Hyperlipidemia, unspecified: Secondary | ICD-10-CM

## 2023-08-13 DIAGNOSIS — E559 Vitamin D deficiency, unspecified: Secondary | ICD-10-CM

## 2023-08-13 DIAGNOSIS — I10 Essential (primary) hypertension: Secondary | ICD-10-CM

## 2023-08-13 DIAGNOSIS — F4321 Adjustment disorder with depressed mood: Secondary | ICD-10-CM

## 2023-08-13 DIAGNOSIS — Z9889 Other specified postprocedural states: Secondary | ICD-10-CM

## 2023-08-13 DIAGNOSIS — E039 Hypothyroidism, unspecified: Secondary | ICD-10-CM

## 2023-08-13 MED ORDER — BENAZEPRIL HCL 10 MG PO TABS: 10.0000 mg | ORAL_TABLET | Freq: Every day | ORAL | 1 refills | Status: DC

## 2023-08-13 MED ORDER — SEMAGLUTIDE (2 MG/DOSE) 8 MG/3ML ~~LOC~~ SOPN: 2.0000 mg | PEN_INJECTOR | SUBCUTANEOUS | 1 refills | Status: AC

## 2023-08-13 MED ORDER — ROSUVASTATIN CALCIUM 40 MG PO TABS: 40.0000 mg | ORAL_TABLET | Freq: Every day | ORAL | 1 refills | Status: AC

## 2023-08-13 MED ORDER — LEVOTHYROXINE SODIUM 50 MCG PO TABS: 50.0000 ug | ORAL_TABLET | Freq: Every day | ORAL | 1 refills | Status: AC

## 2023-08-13 NOTE — Progress Notes (Signed)
 Name: Tabitha Shaw   MRN: [id number]    DOB: [birth year]   Date:08/13/2023       Progress Note  Subjective  Chief Complaint  Chief Complaint  Patient presents with   Medical Management of Chronic Issues   Discussed the use of AI scribe software for clinical note transcription with the patient, who gave verbal consent to proceed.  History of Present Illness  Morbid obesity: BMI is again above 35, she is on Ozempic  2  mg dose since May 2024 , weight was low 220 lbs but continues to trend up since her husband died in 03-05-23.  She has not been motivated to cook and has not been active. Discussed easy meal preps   Hypothyroidism: she is now taking levothyroxine  50 mcg daily and two on Sunday ( 100 mcg ) , last TSH was at goal. She states hair is thinning out, but denies change in bowel movements    DMII : hgbA1C spiked to 8.1% back in August 2020, A1C but has been controlled since and last level down to 6.2 %  She is compliant  Ozempic   2 mg and she was also on  Farxiga  for urine microalbuminuria however she has recurrent UTI 's and vaginitis and since medication stopped vaginitis and uti's resolved.  Denies polyphagia, polydipsia or polyuria. She continues to take Lotensin  for BP control and Crestor  for dyslipidemia and denies side effects   HTN: she is down to 10 mg of Lotensin  and bp is at goal, no dizziness, chest pain or palpitation . BP has bee at goal    Hyperlipidemia: she is now on Crestor  and LDL was at goal, no changes    History of Primary  Hyperparathyroidism: seen by Dr. Damian and referred to Dr. Luke, found to have a right lower parathyroid  enlargement and had surgery on 11/26/2017, and has been released since, reviewed last labs with patient.    Familial Hemochromatosis : She was recently seen by Dr. Jacobo. Unchanged  Memory difficulties: she is worried because daughter noticed she has been forgetful, there is a family history of Alzheimer's dementia, and her 23&Me test  was positive. She saw Dr. Maree, she was diagnosed with mild cognitive dysfunction, she has not been compliant with CPAP but using an oral device and states her watch has tracked good sleep. She is also taking baby aspirin as recommended by Dr. Maree Kirschner Sellar Mass: found on MRI brain, had it repeated 02/23    1. 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 Otherwise unremarkable brain MRI. 2. Essentially stable NeuroQuant volumetric analysis of the brain, see details on [employer].  Grieving: lost her husband 03-05-2023 but states starting to feel better     Patient Active Problem List   Diagnosis Date Noted   Plantar fasciitis of left foot 01/25/2023   Obesity (BMI 30.0-34.9) 09/03/2021   History of parathyroidectomy 08/06/2021   S/P laparoscopic supracervical hysterectomy 04/23/2020   Dysplasia of cervix, low grade (CIN 1) 11/22/2019   History of colonic polyps    Polyp of colon    Cervical high risk HPV (human papillomavirus) test positive 09/03/2016   B12 deficiency 09/04/2015   Elevated PTHrP level 09/04/2015   Benign hypertension 09/12/2014   Acanthosis nigricans 09/09/2014   Type 2 diabetes mellitus with microalbuminuria, with long-term current use of insulin (HCC) 09/09/2014   Dyslipidemia due to type 2 diabetes mellitus (HCC) 09/09/2014   Abnormal  electrocardiogram 09/09/2014   Enlarged kidney 09/09/2014   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HCC) 09/09/2014   Enlarged liver 09/09/2014   Hypercalcemia 09/09/2014   Adult hypothyroidism 09/09/2014   Microalbuminuria 09/09/2014   Spinal stenosis of lumbar region with neurogenic claudication 09/09/2014   Osteopenia 09/09/2014   Vitamin D  deficiency 09/09/2014    Past Surgical History:  Procedure Laterality Date   ABDOMINAL HYSTERECTOMY  2005   blood transfusion yearly     BREAST REDUCTION SURGERY     CESAREAN SECTION     CESAREAN SECTION  1992   CHOLECYSTECTOMY  2000    COLONOSCOPY WITH PROPOFOL  N/A 11/11/2018   Procedure: COLONOSCOPY WITH PROPOFOL ;  Surgeon: Janalyn Keene NOVAK, MD;  Location: [facility] ENDOSCOPY;  Service: Endoscopy;  Laterality: N/A;   COLONOSCOPY WITH PROPOFOL  N/A 02/16/2020   Procedure: COLONOSCOPY WITH PROPOFOL ;  Surgeon: Janalyn Keene NOVAK, MD;  Location: [facility] ENDOSCOPY;  Service: Endoscopy;  Laterality: N/A;   COSMETIC SURGERY  1989   Breast reduction   lapcholecystectomy     PARATHYROIDECTOMY Right 11/26/2017   right lower , Dr. Luke at [facility]   REDUCTION MAMMAPLASTY Bilateral 1985   VESICOVAGINAL FISTULA CLOSURE W/ TAH      Family History  Problem Relation Age of Onset   Colon polyps Mother    Hemachromatosis Mother    Hypertension Mother    Depression Sister    Hypertension Brother    Colon polyps Brother    Cancer Father    Breast cancer Cousin    Breast cancer Cousin    Hypertension Sister     Social History   Tobacco Use   Smoking status: Former    Current packs/day: 0.00    Average packs/day: 0.3 packs/day for 10.0 years (2.5 ttl pk-yrs)    Types: Cigarettes    Start date: 01/19/1982    Quit date: 01/19/1990    Years since quitting: 33.5   Smokeless tobacco: Never  Substance Use Topics   Alcohol use: Yes    Comment: occasionally     Current Outpatient Medications:    ASPIRIN 81 PO, Take 81 mg by mouth daily as needed., Disp: , Rfl:    Blood Glucose Monitoring Suppl (CONTOUR NEXT EZ) w/Device KIT, USE TO CHECK BLOOD SUGAR TWICE  DAILY AS DIRECTED, Disp: 1 kit, Rfl: 0   cholecalciferol (VITAMIN D3) 25 MCG (1000 UNIT) tablet, Take 1,000 Units by mouth daily., Disp: , Rfl:    CONTOUR NEXT TEST test strip, USE 1 STRIP EVERY DAY BEFORE BREAKFAST AS DIRECTED, Disp: 100 strip, Rfl: 1   Multiple Vitamin (MULTIVITAMIN) capsule, Take 1 capsule by mouth daily., Disp: , Rfl:    benazepril  (LOTENSIN ) 10 MG tablet, Take 1 tablet (10 mg total) by mouth daily., Disp: 90 tablet, Rfl: 1   levothyroxine  (SYNTHROID ) 50 MCG tablet,  Take 1-2 tablets (50-100 mcg total) by mouth daily. 50 mcg daily but take two tablets on Sunday, Disp: 102 tablet, Rfl: 1   rosuvastatin  (CRESTOR ) 40 MG tablet, Take 1 tablet (40 mg total) by mouth daily., Disp: 90 tablet, Rfl: 1   Semaglutide , 2 MG/DOSE, 8 MG/3ML SOPN, Inject 2 mg as directed once a week., Disp: 9 mL, Rfl: 1  Allergies  Allergen Reactions   Farxiga  [Dapagliflozin ] Other (See Comments)    Dysuria and recurrent yeast infection     I personally reviewed active problem list, medication list, allergies with the patient/caregiver today.   ROS  Ten systems reviewed and is negative except as mentioned in HPI  Objective Physical Exam   Vitals:   08/13/23 0815  BP: 122/80  Pulse: 85  Resp: 16  SpO2: 95%  Weight: 238 lb (108 kg)  Height: 5' 7 (1.702 m)    Body mass index is 37.28 kg/m.  Recent Results (from the past 2160 hours)  Anatomic Pathology Report     Status: None   Collection Time: 05/18/23 12:00 AM  Result Value Ref Range   Diagnosis synopsis: Comment     Comment: Part 1-Endocervix,Endocervical Curettings: SUPERFICIAL FRAGMENTS OF BENIGN SQUAMOUS EPITHELIUM. NEGATIVE FOR DYSPLASIA AND MALIGNANCY. Part 2-Cervix,Cervical Biopsy,9:00: BENIGN ECTOCERVICAL TISSUE. TRANSFORMATION ZONE NOT REPRESENTED. NEGATIVE FOR DYSPLASIA AND MALIGNANCY. Part 3-Cervix,Cervical Biopsy,1:00: BENIGN ECTOCERVICAL TISSUE. TRANSFORMATION ZONE NOT REPRESENTED. NEGATIVE FOR DYSPLASIA AND MALIGNANCY. Part 4-Left Vaginal Wall ,Vaginal Biopsy: BENIGN ECTOCERVICAL TISSUE. TRANSFORMATION ZONE NOT REPRESENTED. NEGATIVE FOR DYSPLASIA AND MALIGNANCY. Part 5-Right Vaginal Wall ,Vaginal Biopsy: BENIGN ECTOCERVICAL TISSUE. TRANSFORMATION ZONE NOT REPRESENTED. NEGATIVE FOR DYSPLASIA AND MALIGNANCY.    Specimen: Comment     Comment: Part 1-Endocervix,Endocervical Curettings Part 2-Cervix,Cervical Biopsy,9:00 Part 3-Cervix,Cervical Biopsy,1:00 Part 4-Left Vaginal Wall ,Vaginal  Biopsy Part 5-Right Vaginal Wall ,Vaginal Biopsy    Diagnosis: Comment     Comment: Part 1-SUPERFICIAL FRAGMENTS OF BENIGN SQUAMOUS EPITHELIUM. NEGATIVE FOR DYSPLASIA AND MALIGNANCY. Part 2-BENIGN ECTOCERVICAL TISSUE. TRANSFORMATION ZONE NOT REPRESENTED. NEGATIVE FOR DYSPLASIA AND MALIGNANCY. Part 3-BENIGN ECTOCERVICAL TISSUE. TRANSFORMATION ZONE NOT REPRESENTED. NEGATIVE FOR DYSPLASIA AND MALIGNANCY. Part 4-BENIGN ECTOCERVICAL TISSUE. TRANSFORMATION ZONE NOT REPRESENTED. NEGATIVE FOR DYSPLASIA AND MALIGNANCY. Part 5-BENIGN ECTOCERVICAL TISSUE. TRANSFORMATION ZONE NOT REPRESENTED. NEGATIVE FOR DYSPLASIA AND MALIGNANCY.    Gross description: Comment     Comment: Part 1-The specimen is received in formalin labeled GUYE MALLOY, Krithi, ECC. Received is less than 0.1 x 0.1 x 0.1 cm aggregate of tan, mucoid material. The specimen is submitted entirely in cassette 1. Minute specimen, may not survive processing. Part 2-The specimen is received in formalin labeled GUYE MALLOY, Guenevere, 9:00. Received is a fragment of tan pale tissue measuring 0.3 x 0.3 x 0.2 cm. The specimen is submitted entirely in cassette 1. Part 3-The specimen is received in formalin labeled GUYE MALLOY, [place], 1:00. Received is a fragment of tan pale tissue measuring 0.3 x 0.2 x 0.2 cm. The specimen is submitted entirely in cassette 1. Part 4-The specimen is received in formalin labeled GUYE MALLOY, Xochilt, L Vaginal Wall . Received are 2 fragments of tan pale tissue measuring 0.4 x 0.2 x 0.2 cm to 0.4 x 0.2 x 0.1 cm. The specimen is submitted entirely in cassette 1. Part 5-The specimen is received in formalin labeled GUYE MALLOY, Rheba, R Vaginal Wa ll . Received is a fragment of tan pale tissue measuring 0.2 x 0.1 x 0.1 cm. The specimen is submitted entirely in cassette 1.    Case comment(s): Comment     Comment: This case was discussed at the departmental conference and the diagnoses reflect  our consensus opinion.    Electronically signed by: Comment     Comment: Butler Louder, MD, Pathologist   CPT code(s): Comment     Comment: Ejmu [phone] Ejmu [phone] Ejmu [phone] Ejmu [phone] Ejmu [phone]    CPT Disclaimer: Comment     Comment: CPT codes, as published by the AMA, are provided for informational purpose only as the assignment of the CPT codes is the responsibility of the billing party.    Clinician provided ICD: Comment     Comment: R87.612,R87.810,N89.8  HM DIABETES EYE EXAM     Status: None  Collection Time: 07/08/23  9:10 AM  Result Value Ref Range   HM Diabetic Eye Exam No Retinopathy No Retinopathy    Comment: Abstracted by HIM  Comprehensive metabolic panel with GFR     Status: Abnormal   Collection Time: 08/10/23  7:16 AM  Result Value Ref Range   Glucose 142 (H) 70 - 99 mg/dL   BUN 10 8 - 27 mg/dL   Creatinine, Ser 9.24 0.57 - 1.00 mg/dL   eGFR 91 >40 fO/fpw/8.26   BUN/Creatinine Ratio 13 12 - 28   Sodium 139 134 - 144 mmol/L   Potassium 4.4 3.5 - 5.2 mmol/L   Chloride 103 96 - 106 mmol/L   CO2 23 20 - 29 mmol/L   Calcium  9.4 8.7 - 10.3 mg/dL   Total Protein 6.6 6.0 - 8.5 g/dL   Albumin 4.4 3.8 - 4.9 g/dL   Globulin, Total 2.2 1.5 - 4.5 g/dL   Bilirubin Total 0.7 0.0 - 1.2 mg/dL   Alkaline Phosphatase 61 44 - 121 IU/L   AST 34 0 - 40 IU/L   ALT 32 0 - 32 IU/L  PTH, intact and calcium      Status: None   Collection Time: 08/10/23  7:16 AM  Result Value Ref Range   PTH 32 15 - 65 pg/mL   PTH Interp Comment     Comment: Interpretation                 Intact PTH    Calcium                                  (pg/mL)      (mg/dL) Normal                          15 - 65     8.6 - 10.2 Primary Hyperparathyroidism         >65          >10.2 Secondary Hyperparathyroidism       >65          <10.2 Non-Parathyroid  Hypercalcemia       <65          >10.2 Hypoparathyroidism                  <15          < 8.6 Non-Parathyroid  Hypocalcemia    15 - 65           < 8.6   Hemoglobin A1c     Status: Abnormal   Collection Time: 08/10/23  7:16 AM  Result Value Ref Range   Hgb A1c MFr Bld 6.4 (H) 4.8 - 5.6 %    Comment:          Prediabetes: 5.7 - 6.4          Diabetes: >6.4          Glycemic control for adults with diabetes: <7.0    Est. average glucose Bld gHb Est-mCnc 137 mg/dL  TSH     Status: None   Collection Time: 08/10/23  7:16 AM  Result Value Ref Range   TSH 1.500 0.450 - 4.500 uIU/mL    Diabetic Foot Exam:  Diabetic foot exam was performed with the following findings:   No deformities, ulcerations, or other skin breakdown Normal sensation of 10g monofilament Intact posterior tibialis and dorsalis pedis pulses  PHQ2/9:    04/12/2023   10:57 AM 02/26/2023    7:45 AM 11/04/2022    7:48 AM 06/09/2022    3:59 PM 03/26/2022    1:07 PM  Depression screen PHQ 2/9  Decreased Interest 1 2 0 0 0  Down, Depressed, Hopeless 1 2 0 0 0  PHQ - 2 Score 2 4 0 0 0  Altered sleeping 0 2 0 0   Tired, decreased energy 0 2 1 0   Change in appetite 0 0 0 0   Feeling bad or failure about yourself  0 0 0 0   Trouble concentrating 0 2 0 0   Moving slowly or fidgety/restless 0 0 0 0   Suicidal thoughts 0 0 0 0   PHQ-9 Score 2 10 1  0   Difficult doing work/chores Somewhat difficult Somewhat difficult       phq 9 is positive  Fall Risk:    02/26/2023    7:45 AM 11/04/2022    7:48 AM 06/09/2022    3:59 PM 03/26/2022    1:07 PM 02/23/2022    7:57 AM  Fall Risk   Falls in the past year? 0 0 0 0 0  Number falls in past yr: 0 0  0 0  Injury with Fall? 0 0  0 0  Risk for fall due to : No Fall Risks No Fall Risks No Fall Risks  No Fall Risks  Follow up Falls prevention discussed;Education provided;Falls evaluation completed Falls prevention discussed Falls prevention discussed  Falls prevention discussed     Assessment & Plan  1. Dyslipidemia due to type 2 diabetes mellitus (HCC) (Primary)  - HM Diabetes Foot Exam - rosuvastatin  (CRESTOR ) 40  MG tablet; Take 1 tablet (40 mg total) by mouth daily.  Dispense: 90 tablet; Refill: 1  2. Type 2 diabetes mellitus with microalbuminuria, with long-term current use of insulin (HCC)  - Semaglutide , 2 MG/DOSE, 8 MG/3ML SOPN; Inject 2 mg as directed once a week.  Dispense: 9 mL; Refill: 1  3. 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HCC)  Keep visit with hematologist   4. Morbid obesity (HCC)  Needs to resume healthier diet  Discussed meal prep   5. Adult hypothyroidism  - levothyroxine  (SYNTHROID ) 50 MCG tablet; Take 1-2 tablets (50-100 mcg total) by mouth daily. 50 mcg daily but take two tablets on Sunday  Dispense: 102 tablet; Refill: 1  6. History of parathyroidectomy  Normal Pth and calcium    7. Benign hypertension  - benazepril  (LOTENSIN ) 10 MG tablet; Take 1 tablet (10 mg total) by mouth daily.  Dispense: 90 tablet; Refill: 1  8. Immunization due  - Pneumococcal conjugate vaccine 20-valent (Prevnar 20)  9. Vitamin D  deficiency  Taking supplementation   10. Grieving

## 2023-09-21 ENCOUNTER — Other Ambulatory Visit: Payer: Self-pay | Admitting: Family Medicine

## 2023-09-21 DIAGNOSIS — R809 Proteinuria, unspecified: Secondary | ICD-10-CM

## 2023-10-12 ENCOUNTER — Encounter: Payer: Self-pay | Admitting: Nurse Practitioner

## 2023-10-15 ENCOUNTER — Inpatient Hospital Stay: Attending: Oncology

## 2023-10-15 NOTE — Progress Notes (Deleted)
 Tabitha Shaw presents today for phlebotomy per MD orders. Phlebotomy procedure started at 1317 and ended at ***. *** cc removed. Patient tolerated procedure well. IV needle removed intact.

## 2023-10-27 ENCOUNTER — Other Ambulatory Visit: Payer: Self-pay | Admitting: Family Medicine

## 2023-10-27 DIAGNOSIS — E039 Hypothyroidism, unspecified: Secondary | ICD-10-CM

## 2023-11-10 ENCOUNTER — Encounter: Payer: Self-pay | Admitting: Family Medicine

## 2023-11-11 ENCOUNTER — Other Ambulatory Visit: Payer: Self-pay | Admitting: Family Medicine

## 2023-11-18 ENCOUNTER — Ambulatory Visit: Admitting: Family Medicine

## 2023-11-18 VITALS — BP 124/76 | HR 80 | Resp 16 | Ht 67.0 in | Wt 241.0 lb

## 2023-11-18 DIAGNOSIS — Z794 Long term (current) use of insulin: Secondary | ICD-10-CM

## 2023-11-18 DIAGNOSIS — K760 Fatty (change of) liver, not elsewhere classified: Secondary | ICD-10-CM

## 2023-11-18 DIAGNOSIS — L811 Chloasma: Secondary | ICD-10-CM

## 2023-11-18 DIAGNOSIS — I10 Essential (primary) hypertension: Secondary | ICD-10-CM

## 2023-11-18 DIAGNOSIS — E1169 Type 2 diabetes mellitus with other specified complication: Secondary | ICD-10-CM | POA: Diagnosis not present

## 2023-11-18 DIAGNOSIS — L659 Nonscarring hair loss, unspecified: Secondary | ICD-10-CM

## 2023-11-18 DIAGNOSIS — Z23 Encounter for immunization: Secondary | ICD-10-CM | POA: Diagnosis not present

## 2023-11-18 DIAGNOSIS — Z9089 Acquired absence of other organs: Secondary | ICD-10-CM

## 2023-11-18 DIAGNOSIS — E039 Hypothyroidism, unspecified: Secondary | ICD-10-CM

## 2023-11-18 DIAGNOSIS — E119 Type 2 diabetes mellitus without complications: Secondary | ICD-10-CM | POA: Insufficient documentation

## 2023-11-18 DIAGNOSIS — E1129 Type 2 diabetes mellitus with other diabetic kidney complication: Secondary | ICD-10-CM | POA: Diagnosis not present

## 2023-11-18 DIAGNOSIS — Z1231 Encounter for screening mammogram for malignant neoplasm of breast: Secondary | ICD-10-CM

## 2023-11-18 DIAGNOSIS — M8588 Other specified disorders of bone density and structure, other site: Secondary | ICD-10-CM

## 2023-11-18 DIAGNOSIS — E785 Hyperlipidemia, unspecified: Secondary | ICD-10-CM

## 2023-11-18 LAB — POCT GLYCOSYLATED HEMOGLOBIN (HGB A1C): Hemoglobin A1C: 6.1 % — AB (ref 4.0–5.6)

## 2023-11-18 MED ORDER — HYDROQUINONE 4 % EX CREA: TOPICAL_CREAM | Freq: Two times a day (BID) | CUTANEOUS | 0 refills | Status: AC

## 2023-11-18 NOTE — Progress Notes (Signed)
 Name: Tabitha Shaw   MRN: [id number]    DOB: [birth year]   Date:11/18/2023       Progress Note  Subjective  Chief Complaint  Chief Complaint  Patient presents with   Medicare Wellness   Discussed the use of AI scribe software for clinical note transcription with the patient, who gave verbal consent to proceed.  History of Present Illness Tabitha Shaw is a 63 year old female who presents for a routine follow-up visit.  She is experiencing an improvement in her mood since the loss of her husband over six months ago. She engages in activities such as visiting the beach with her cousins, friends, and her aunt, [name], to keep busy. Her depression score has improved from 10 in February to 1 currently. No feelings of being down, depressed, or hopeless, and no trouble with sleep, energy, appetite, or concentration. No thoughts of self-harm.  She has type 2 diabetes and is currently taking Ozempic , which is not significantly curbing her appetite and she is concerned she will continue to gain weight.. Her diabetes is associated with obesity, HTN and Dyslipidemia. No increased thirsty or urination   She takes Lisinopril 10 mg daily for hypertension. No chest pain, palpitations, or shortness of breath.  She takes Rosuvastatin  for dyslipidemia and reports no muscle aches. Her last cholesterol check showed her LDL was 60 mg/dL.  She has hypothyroidism and takes Levothyroxine , 50 mcg daily, with an additional dose on Sundays. Her TSH levels have been stable since adjustments earlier in the year.  She has a history of primary hyperparathyroidism, for which she had a right lower parathyroidectomy in November 2019. Her last PTH was within normal limits.  She has familial hemochromatosis and is managed with phlebotomy. Her ferritin levels are stable.  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 She has not seen a neurosurgeon for this.  She reports hair thinning  but is not interested in treatment at this time. She also has melasma and has previously been prescribed spironolactone , which was ineffective.  She has osteopenia. Her last bone density test was in May 2023.  She has fatty liver disease and is monitored with ultrasounds. Her ferritin level was slightly elevated at 151 ng/mL.    Patient Active Problem List   Diagnosis Date Noted   Plantar fasciitis of left foot 01/25/2023   Obesity (BMI 30.0-34.9) 09/03/2021   History of parathyroidectomy 08/06/2021   S/P laparoscopic supracervical hysterectomy 04/23/2020   Dysplasia of cervix, low grade (CIN 1) 11/22/2019   History of colonic polyps    Polyp of colon    Cervical high risk HPV (human papillomavirus) test positive 09/03/2016   B12 deficiency 09/04/2015   Elevated PTHrP level 09/04/2015   Benign hypertension 09/12/2014   Acanthosis nigricans 09/09/2014   Type 2 diabetes mellitus with microalbuminuria, with long-term current use of insulin (HCC) 09/09/2014   Dyslipidemia due to type 2 diabetes mellitus (HCC) 09/09/2014   Abnormal electrocardiogram 09/09/2014   Enlarged kidney 09/09/2014   Familial hemochromatosis 09/09/2014   Enlarged liver 09/09/2014   Hypercalcemia 09/09/2014   Adult hypothyroidism 09/09/2014   Microalbuminuria 09/09/2014   Spinal stenosis of lumbar region with neurogenic claudication 09/09/2014   Osteopenia 09/09/2014   Vitamin D  deficiency 09/09/2014    Past Surgical History:  Procedure Laterality Date   ABDOMINAL HYSTERECTOMY  2005   blood transfusion yearly     BREAST REDUCTION SURGERY     CESAREAN SECTION  CESAREAN SECTION  1992   CHOLECYSTECTOMY  2000   COLONOSCOPY WITH PROPOFOL  N/A 11/11/2018   Procedure: COLONOSCOPY WITH PROPOFOL ;  Surgeon: Janalyn Keene NOVAK, MD;  Location: [facility] ENDOSCOPY;  Service: Endoscopy;  Laterality: N/A;   COLONOSCOPY WITH PROPOFOL  N/A 02/16/2020   Procedure: COLONOSCOPY WITH PROPOFOL ;  Surgeon: Janalyn Keene NOVAK,  MD;  Location: [facility] ENDOSCOPY;  Service: Endoscopy;  Laterality: N/A;   COSMETIC SURGERY  1989   Breast reduction   lapcholecystectomy     PARATHYROIDECTOMY Right 11/26/2017   right lower , Dr[facility] Luke at [facility]   REDUCTION MAMMAPLASTY Bilateral 1985   VESICOVAGINAL FISTULA CLOSURE W/ TAH      Family History  Problem Relation Age of Onset   Colon polyps Mother    Hemachromatosis Mother    Hypertension Mother    Depression Sister    Hypertension Brother    Colon polyps Brother    Cancer Father    Breast cancer Cousin    Breast cancer Cousin    Hypertension Sister     Social History   Tobacco Use   Smoking status: Former    Current packs/day: 0.00    Average packs/day: 0.3 packs/day for 10.0 years (2.5 ttl pk-yrs)    Types: Cigarettes    Start date: 01/19/1982    Quit date: 01/19/1990    Years since quitting: 33.8   Smokeless tobacco: Never  Substance Use Topics   Alcohol use: Yes    Comment: occasionally     Current Outpatient Medications:    ASPIRIN 81 PO, Take 81 mg by mouth daily as needed., Disp: , Rfl:    benazepril  (LOTENSIN ) 10 MG tablet, Take 1 tablet (10 mg total) by mouth daily., Disp: 90 tablet, Rfl: 1   Blood Glucose Monitoring Suppl (CONTOUR NEXT EZ) w/Device KIT, USE TO CHECK BLOOD SUGAR TWICE  DAILY AS DIRECTED, Disp: 1 kit, Rfl: 0   cholecalciferol (VITAMIN D3) 25 MCG (1000 UNIT) tablet, Take 1,000 Units by mouth daily., Disp: , Rfl:    CONTOUR NEXT TEST test strip, USE TO TEST BLOOD SUGAR EVERY DAY BEFORE BREAKFAST AS DIRECTED, Disp: 100 strip, Rfl: 1   levothyroxine  (SYNTHROID ) 50 MCG tablet, Take 1-2 tablets (50-100 mcg total) by mouth daily. 50 mcg daily but take two tablets on Sunday, Disp: 102 tablet, Rfl: 1   Multiple Vitamin (MULTIVITAMIN) capsule, Take 1 capsule by mouth daily., Disp: , Rfl:    rosuvastatin  (CRESTOR ) 40 MG tablet, Take 1 tablet (40 mg total) by mouth daily., Disp: 90 tablet, Rfl: 1   Semaglutide , 2 MG/DOSE, 8 MG/3ML SOPN, Inject 2 mg as  directed once a week., Disp: 9 mL, Rfl: 1  Allergies  Allergen Reactions   Farxiga  [Dapagliflozin ] Other (See Comments)    Dysuria and recurrent yeast infection     I personally reviewed active problem list, medication list, allergies, family history with the patient/caregiver today.   ROS  Ten systems reviewed and is negative except as mentioned in HPI    Objective Physical Exam VITALS: BP- 124/76 MEASUREMENTS: Weight- 241. CONSTITUTIONAL: Patient appears well-developed and well-nourished. No distress. HEENT: Head atraumatic, normocephalic, neck supple. CARDIOVASCULAR: Normal rate, regular rhythm and normal heart sounds. No murmur heard. No BLE edema. PULMONARY: Effort normal and breath sounds normal. No respiratory distress. ABDOMINAL: There is no tenderness or distention. MUSCULOSKELETAL: Normal gait. Without gross motor or sensory deficit. PSYCHIATRIC: Patient has a normal mood and affect. Behavior is normal. Judgment and thought content normal.  Vitals:   11/18/23 0911  BP: 124/76  Pulse: 80  Resp: 16  SpO2: 97%  Weight: 241 lb (109.3 kg)  Height: 5' 7 (1.702 m)    Body mass index is 37.75 kg/m.  Recent Results (from the past 2160 hours)  POCT HgB A1C     Status: Abnormal   Collection Time: 11/18/23  9:23 AM  Result Value Ref Range   Hemoglobin A1C 6.1 (A) 4.0 - 5.6 %   HbA1c POC (<> result, manual entry)     HbA1c, POC (prediabetic range)     HbA1c, POC (controlled diabetic range)      Diabetic Foot Exam:     PHQ2/9:    11/18/2023    9:25 AM 04/12/2023   10:57 AM 02/26/2023    7:45 AM 11/04/2022    7:48 AM 06/09/2022    3:59 PM  Depression screen PHQ 2/9  Decreased Interest 1 1 2  0 0  Down, Depressed, Hopeless 0 1 2 0 0  PHQ - 2 Score 1 2 4  0 0  Altered sleeping 0 0 2 0 0  Tired, decreased energy 0 0 2 1 0  Change in appetite 0 0 0 0 0  Feeling bad or failure about yourself   0 0 0 0  Trouble concentrating 0 0 2 0 0  Moving slowly or  fidgety/restless 0 0 0 0 0  Suicidal thoughts 0 0 0 0 0  PHQ-9 Score 1 2 10 1  0  Difficult doing work/chores Not difficult at all Somewhat difficult Somewhat difficult      phq 9 is positive  Fall Risk:    02/26/2023    7:45 AM 11/04/2022    7:48 AM 06/09/2022    3:59 PM 03/26/2022    1:07 PM 02/23/2022    7:57 AM  Fall Risk   Falls in the past year? 0 0 0 0 0  Number falls in past yr: 0 0  0 0  Injury with Fall? 0 0  0 0  Risk for fall due to : No Fall Risks No Fall Risks No Fall Risks  No Fall Risks  Follow up Falls prevention discussed;Education provided;Falls evaluation completed Falls prevention discussed Falls prevention discussed  Falls prevention discussed     Assessment & Plan Type 2 diabetes mellitus with microalbuminuria and, HTN dyslipidemia and obesity Diabetes managed with Ozempic , dyslipidemia controlled with rosuvastatin , LDL at 60. Microalbuminuria noted. - Continue Ozempic  for diabetes management. - Consider Mounjaro after Ozempic  if weight loss desired and insurance covers. - Continue rosuvastatin  for dyslipidemia. - Monitor blood glucose and lipid levels.  Morbid obesity  BMI over 35 with DM as a co-morbidity , weight 241 lbs, slightly increased. Goal to reduce BMI below 35. - Continue Ozempic  for weight management. - Consider Mounjaro for enhanced weight loss if insurance covers. - Encourage lifestyle modifications.  Fatty liver disease Associated with diabetes and obesity. Ferritin slightly elevated at 151. Risk of liver cancer necessitates monitoring. - Continue management of diabetes and obesity. - Monitor liver function and ferritin levels.  Hypertension Controlled with loptansin, blood pressure at 124/76 mmHg. - Continue loptansin 10 mg. - Monitor blood pressure.  Hereditary hemochromatosis Managed with regular phlebotomy. Ferritin levels stable. - Continue regular phlebotomy. - Monitor ferritin levels.  Hypothyroidism Managed with  levothyroxine , TSH levels normal. - Continue levothyroxine .  Osteopenia Low fracture risk. Management includes lifestyle modifications. - Encourage physical activity and high calcium  diet. - Ensure adequate vitamin D  intake.  Chloasma (melasma) Present on face. Previous treatment with  spironolactone  ineffective. - Prescribe hydroquinone cream. - Consider dermatology referral if needed.  Depression, improved Significantly improved, PHQ-9 score reduced from 10 to 1.  Primary hyperparathyroidism, post-parathyroidectomy Post-parathyroidectomy, PTH levels normal. - Monitor PTH levels as needed.  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 Seen by  neurologist - Dr. Maree  General Health Maintenance Up to date except for mammogram due in December. - Administer flu shot. - Order mammogram and instruct her to schedule appointment.

## 2024-01-07 ENCOUNTER — Ambulatory Visit
Admission: RE | Admit: 2024-01-07 | Discharge: 2024-01-07 | Disposition: A | Discharge status: Home / Self Care | Source: Ambulatory Visit | Attending: Family Medicine | Admitting: Family Medicine

## 2024-01-07 DIAGNOSIS — Z1231 Encounter for screening mammogram for malignant neoplasm of breast: Secondary | ICD-10-CM | POA: Insufficient documentation

## 2024-01-11 ENCOUNTER — Encounter: Payer: Self-pay | Admitting: Family Medicine

## 2024-02-03 ENCOUNTER — Other Ambulatory Visit: Payer: Self-pay | Admitting: Family Medicine

## 2024-02-03 DIAGNOSIS — I10 Essential (primary) hypertension: Secondary | ICD-10-CM

## 2024-02-04 NOTE — Telephone Encounter (Signed)
 Requested Prescriptions  Pending Prescriptions Disp Refills   benazepril  (LOTENSIN ) 10 MG tablet [Pharmacy Med Name: BENAZEPRIL  10MG  TABLETS] 90 tablet 1    Sig: TAKE 1 TABLET(10 MG) BY MOUTH DAILY     Cardiovascular:  ACE Inhibitors Passed - 02/04/2024 10:19 AM      Passed - Cr in normal range and within 180 days    Creatinine, Ser  Date Value Ref Range Status  08/10/2023 0.75 0.57 - 1.00 mg/dL Final         Passed - K in normal range and within 180 days    Potassium  Date Value Ref Range Status  08/10/2023 4.4 3.5 - 5.2 mmol/L Final         Passed - Patient is not pregnant      Passed - Last BP in normal range    BP Readings from Last 1 Encounters:  11/18/23 124/76         Passed - Valid encounter within last 6 months    Recent Outpatient Visits           2 months ago Type 2 diabetes mellitus with microalbuminuria, with long-term current use of insulin [facility])   [facility] Glenard Mire, MD   5 months ago Dyslipidemia due to type 2 diabetes mellitus [facility])   [facility] Glenard Mire, MD   9 months ago Familial hemochromatosis   [facility] Glenard Mire, MD   11 months ago Well adult exam   [facility] Sowles, Krichna, MD

## 2024-02-08 ENCOUNTER — Encounter: Payer: Self-pay | Admitting: Nurse Practitioner

## 2024-02-11 ENCOUNTER — Inpatient Hospital Stay: Attending: Oncology

## 2024-02-11 NOTE — Patient Instructions (Signed)

## 2024-02-11 NOTE — Progress Notes (Signed)
 Tabitha Shaw Ardine Maud presents today for phlebotomy per MD orders. Phlebotomy procedure started at 1330 and ended at 1355. 500 mls removed. Patient tolerated procedure well. IV needle removed intact.

## 2024-03-21 ENCOUNTER — Ambulatory Visit: Admitting: Family Medicine

## 2024-06-09 ENCOUNTER — Ambulatory Visit: Admitting: Nurse Practitioner

## 2024-06-09 ENCOUNTER — Encounter
